# Patient Record
Sex: Female | Born: 1940 | Race: White | Hispanic: No | Marital: Married | Smoking: Never smoker
Health system: Southern US, Community
[De-identification: ages and names within clinical notes are randomized; demographics above are authoritative.]

## PROBLEM LIST (undated history)

## (undated) DIAGNOSIS — M8580 Other specified disorders of bone density and structure, unspecified site: Secondary | ICD-10-CM

## (undated) DIAGNOSIS — I639 Cerebral infarction, unspecified: Secondary | ICD-10-CM

## (undated) DIAGNOSIS — M1990 Unspecified osteoarthritis, unspecified site: Secondary | ICD-10-CM

## (undated) DIAGNOSIS — K922 Gastrointestinal hemorrhage, unspecified: Secondary | ICD-10-CM

## (undated) DIAGNOSIS — N3010 Interstitial cystitis (chronic) without hematuria: Secondary | ICD-10-CM

## (undated) DIAGNOSIS — K259 Gastric ulcer, unspecified as acute or chronic, without hemorrhage or perforation: Secondary | ICD-10-CM

## (undated) DIAGNOSIS — I10 Essential (primary) hypertension: Secondary | ICD-10-CM

## (undated) DIAGNOSIS — C801 Malignant (primary) neoplasm, unspecified: Secondary | ICD-10-CM

## (undated) DIAGNOSIS — U071 COVID-19: Secondary | ICD-10-CM

## (undated) HISTORY — PX: BUNIONECTOMY WITH HAMMERTOE RECONSTRUCTION: SHX5600

## (undated) HISTORY — DX: Essential (primary) hypertension: I10

## (undated) HISTORY — DX: Other specified disorders of bone density and structure, unspecified site: M85.80

## (undated) HISTORY — PX: KNEE SURGERY: SHX244

## (undated) HISTORY — PX: TOTAL VAGINAL HYSTERECTOMY: SHX2548

## (undated) HISTORY — PX: TOTAL ABDOMINAL HYSTERECTOMY W/ BILATERAL SALPINGOOPHORECTOMY: SHX83

## (undated) HISTORY — PX: BREAST SURGERY: SHX581

## (undated) HISTORY — DX: Malignant (primary) neoplasm, unspecified: C80.1

## (undated) HISTORY — PX: TONSILLECTOMY AND ADENOIDECTOMY: SUR1326

## (undated) HISTORY — DX: Gastric ulcer, unspecified as acute or chronic, without hemorrhage or perforation: K25.9

## (undated) HISTORY — DX: Interstitial cystitis (chronic) without hematuria: N30.10

## (undated) HISTORY — PX: BILATERAL SALPINGOOPHORECTOMY: SHX1223

---

## 1998-10-17 ENCOUNTER — Encounter: Payer: Self-pay | Admitting: Obstetrics and Gynecology

## 1998-10-17 ENCOUNTER — Ambulatory Visit (HOSPITAL_COMMUNITY): Admission: RE | Admit: 1998-10-17 | Discharge: 1998-10-17 | Payer: Self-pay | Admitting: Obstetrics and Gynecology

## 1999-01-01 ENCOUNTER — Other Ambulatory Visit: Admission: RE | Admit: 1999-01-01 | Discharge: 1999-01-01 | Payer: Self-pay | Admitting: Obstetrics and Gynecology

## 1999-08-02 ENCOUNTER — Ambulatory Visit (HOSPITAL_BASED_OUTPATIENT_CLINIC_OR_DEPARTMENT_OTHER): Admission: RE | Admit: 1999-08-02 | Discharge: 1999-08-02 | Payer: Self-pay | Admitting: *Deleted

## 1999-08-02 ENCOUNTER — Encounter (INDEPENDENT_AMBULATORY_CARE_PROVIDER_SITE_OTHER): Payer: Self-pay | Admitting: *Deleted

## 1999-08-02 ENCOUNTER — Encounter: Payer: Self-pay | Admitting: *Deleted

## 2000-04-21 ENCOUNTER — Other Ambulatory Visit: Admission: RE | Admit: 2000-04-21 | Discharge: 2000-04-21 | Payer: Self-pay | Admitting: Obstetrics and Gynecology

## 2000-05-11 ENCOUNTER — Ambulatory Visit (HOSPITAL_BASED_OUTPATIENT_CLINIC_OR_DEPARTMENT_OTHER): Admission: RE | Admit: 2000-05-11 | Discharge: 2000-05-11 | Payer: Self-pay | Admitting: *Deleted

## 2000-05-11 ENCOUNTER — Encounter (INDEPENDENT_AMBULATORY_CARE_PROVIDER_SITE_OTHER): Payer: Self-pay | Admitting: *Deleted

## 2001-01-15 ENCOUNTER — Encounter: Payer: Self-pay | Admitting: *Deleted

## 2001-01-15 ENCOUNTER — Ambulatory Visit (HOSPITAL_COMMUNITY): Admission: RE | Admit: 2001-01-15 | Discharge: 2001-01-15 | Payer: Self-pay | Admitting: *Deleted

## 2001-07-12 ENCOUNTER — Encounter: Payer: Self-pay | Admitting: *Deleted

## 2001-07-12 ENCOUNTER — Ambulatory Visit (HOSPITAL_COMMUNITY): Admission: RE | Admit: 2001-07-12 | Discharge: 2001-07-12 | Payer: Self-pay | Admitting: General Surgery

## 2001-12-02 ENCOUNTER — Encounter: Payer: Self-pay | Admitting: Unknown Physician Specialty

## 2001-12-02 ENCOUNTER — Ambulatory Visit (HOSPITAL_COMMUNITY): Admission: RE | Admit: 2001-12-02 | Discharge: 2001-12-02 | Payer: Self-pay | Admitting: Unknown Physician Specialty

## 2002-07-13 ENCOUNTER — Ambulatory Visit (HOSPITAL_COMMUNITY): Admission: RE | Admit: 2002-07-13 | Discharge: 2002-07-13 | Payer: Self-pay | Admitting: General Surgery

## 2002-07-13 ENCOUNTER — Encounter: Payer: Self-pay | Admitting: *Deleted

## 2003-06-28 ENCOUNTER — Encounter: Admission: RE | Admit: 2003-06-28 | Discharge: 2003-06-28 | Payer: Self-pay | Admitting: Internal Medicine

## 2003-08-02 ENCOUNTER — Ambulatory Visit (HOSPITAL_COMMUNITY): Admission: RE | Admit: 2003-08-02 | Discharge: 2003-08-02 | Payer: Self-pay

## 2003-08-02 ENCOUNTER — Encounter: Admission: RE | Admit: 2003-08-02 | Discharge: 2003-08-02 | Payer: Self-pay | Admitting: Unknown Physician Specialty

## 2003-08-08 ENCOUNTER — Ambulatory Visit (HOSPITAL_COMMUNITY): Admission: RE | Admit: 2003-08-08 | Discharge: 2003-08-08 | Payer: Self-pay | Admitting: Neurosurgery

## 2003-08-24 ENCOUNTER — Encounter: Admission: RE | Admit: 2003-08-24 | Discharge: 2003-08-24 | Payer: Self-pay | Admitting: Neurosurgery

## 2003-09-07 ENCOUNTER — Encounter: Admission: RE | Admit: 2003-09-07 | Discharge: 2003-09-07 | Payer: Self-pay | Admitting: Family Medicine

## 2003-09-25 ENCOUNTER — Encounter: Admission: RE | Admit: 2003-09-25 | Discharge: 2003-09-25 | Payer: Self-pay | Admitting: Neurosurgery

## 2004-03-11 ENCOUNTER — Encounter: Admission: RE | Admit: 2004-03-11 | Discharge: 2004-03-11 | Payer: Self-pay | Admitting: Oncology

## 2004-08-07 ENCOUNTER — Ambulatory Visit (HOSPITAL_COMMUNITY): Admission: RE | Admit: 2004-08-07 | Discharge: 2004-08-07 | Payer: Self-pay | Admitting: Oncology

## 2004-09-12 ENCOUNTER — Ambulatory Visit: Payer: Self-pay | Admitting: Oncology

## 2004-11-13 ENCOUNTER — Ambulatory Visit (HOSPITAL_COMMUNITY): Admission: RE | Admit: 2004-11-13 | Discharge: 2004-11-13 | Payer: Self-pay | Admitting: Urology

## 2004-12-03 ENCOUNTER — Ambulatory Visit (HOSPITAL_BASED_OUTPATIENT_CLINIC_OR_DEPARTMENT_OTHER): Admission: RE | Admit: 2004-12-03 | Discharge: 2004-12-03 | Payer: Self-pay | Admitting: Urology

## 2004-12-03 ENCOUNTER — Encounter (INDEPENDENT_AMBULATORY_CARE_PROVIDER_SITE_OTHER): Payer: Self-pay | Admitting: *Deleted

## 2004-12-03 ENCOUNTER — Ambulatory Visit (HOSPITAL_COMMUNITY): Admission: RE | Admit: 2004-12-03 | Discharge: 2004-12-03 | Payer: Self-pay | Admitting: Urology

## 2005-07-28 HISTORY — PX: CATARACT EXTRACTION: SUR2

## 2005-10-15 ENCOUNTER — Ambulatory Visit (HOSPITAL_COMMUNITY): Admission: RE | Admit: 2005-10-15 | Discharge: 2005-10-15 | Payer: Self-pay | Admitting: Surgery

## 2006-06-27 DIAGNOSIS — C50919 Malignant neoplasm of unspecified site of unspecified female breast: Secondary | ICD-10-CM

## 2006-06-27 DIAGNOSIS — C801 Malignant (primary) neoplasm, unspecified: Secondary | ICD-10-CM

## 2006-06-27 HISTORY — DX: Malignant neoplasm of unspecified site of unspecified female breast: C50.919

## 2006-06-27 HISTORY — DX: Malignant (primary) neoplasm, unspecified: C80.1

## 2006-11-18 ENCOUNTER — Ambulatory Visit (HOSPITAL_COMMUNITY): Admission: RE | Admit: 2006-11-18 | Discharge: 2006-11-18 | Payer: Self-pay | Admitting: Surgery

## 2007-07-28 ENCOUNTER — Other Ambulatory Visit: Admission: RE | Admit: 2007-07-28 | Discharge: 2007-07-28 | Payer: Self-pay | Admitting: Obstetrics & Gynecology

## 2007-11-22 ENCOUNTER — Ambulatory Visit (HOSPITAL_COMMUNITY): Admission: RE | Admit: 2007-11-22 | Discharge: 2007-11-22 | Payer: Self-pay | Admitting: Surgery

## 2008-08-03 ENCOUNTER — Other Ambulatory Visit: Admission: RE | Admit: 2008-08-03 | Discharge: 2008-08-03 | Payer: Self-pay | Admitting: Obstetrics and Gynecology

## 2008-10-02 ENCOUNTER — Encounter: Admission: RE | Admit: 2008-10-02 | Discharge: 2008-10-02 | Payer: Self-pay | Admitting: Orthopedic Surgery

## 2009-03-13 ENCOUNTER — Ambulatory Visit (HOSPITAL_COMMUNITY): Admission: RE | Admit: 2009-03-13 | Discharge: 2009-03-13 | Payer: Self-pay | Admitting: Surgery

## 2010-03-22 ENCOUNTER — Ambulatory Visit (HOSPITAL_COMMUNITY): Admission: RE | Admit: 2010-03-22 | Discharge: 2010-03-22 | Payer: Self-pay | Admitting: Internal Medicine

## 2010-12-13 NOTE — Op Note (Signed)
[place]  Patient:    Doris Ford                      MRN: [id number] Proc. Date: 08/02/99 Adm. Date:  [id number] Attending:  Kandis Mannan CC:         Malachi Pro. Ambrose Mantle, M.D.             Jonelle Sports. Cheryll Cockayne, M.D.             Thomas B. Samuella Cota, M.D.                           Operative Report  CCS#:  [id number]  PREOPERATIVE DIAGNOSIS:  Abnormal mammogram, right breast.  POSTOPERATIVE DIAGNOSIS:  Abnormal mammogram, right breast.  OPERATION:  Needle localization and biopsy of right breast.  SURGEON:  Maisie Fus B. Samuella Cota, M.D.  ANESTHESIA:  1% Xylocaine local with anesthesia monitoring by anesthesiologist nd C.R.N.A.  DESCRIPTION OF PROCEDURE:  The patient was taken to the operating room and placed on the table in the supine position.  The right breast was prepped and draped as a sterile field.  The patient had a wire coming through the skin in the 3 oclock position about 3 cm lateral to the edge of the areolar.  A transverse elliptical incision was outlined with the skin marker.  Then 1% Xylocaine local was used to infiltrate the skin and underlying breast tissue, and then it was used throughout as necessary.  An elliptical incision was made, and using the wire as a guide,  core of breast tissue was removed.  The patient had very small breasts, and I removed the breast tissue down to the pectoralis major muscle.  A rather generous biopsy around the wire was taken.  The specimen was x-rayed, and the area of concern was in the x-ray.  The bleeding had been controlled with the cautery. he wound was dry.  The subcutaneous tissue was reapproximated with interrupted sutures of 3-0 Vicryl, and the skin was closed with a running subcuticular suture of 5-0 Vicryl, and reinforced with benzoin and 0.5 inch Steri-Strips.  A pressure dressing using 4 x 4s, ABD, and 4 inch hypofix was applied.  The patient seemed to tolerated the procedure well  and was taken to the PACU in satisfactory condition. DD:  08/02/99 TD:  08/03/99 Job: [id number]

## 2010-12-13 NOTE — Op Note (Signed)
NAME:  Doris Ford, DANTE NO.:  [id number]   MEDICAL RECORD NO.:  [id number]          PATIENT TYPE:  AMB   LOCATION:  [facility]                         FACILITY:  [facility]   PHYSICIAN:  Jamison Neighbor, M.D.  DATE OF BIRTH:  [birth year]   DATE OF PROCEDURE:  12/03/2004  DATE OF DISCHARGE:                                 OPERATIVE REPORT   PREOPERATIVE DIAGNOSIS:  Painful bladder syndrome, rule out interstitial  cystitis.   POSTOPERATIVE DIAGNOSIS:  Painful bladder syndrome, rule out interstitial  cystitis.   PROCEDURES:  1.  Cystoscopy.  2.  Urethral calibration.  3.  Hydrodistention of the bladder.  4.  Marcaine and Pyridium instillation.  5.  Marcaine and Kenalog injection.  6.  Bladder biopsy with cauterization.   SURGEON:  Jamison Neighbor, M.D.   ANESTHESIA:  General.   COMPLICATIONS:  None.   DRAINS:  None.   BRIEF HISTORY:  This 70 year old female has a history of cystitis.  The  patient does not respond particularly well to antibiotic therapy and really  has not had particularly well-documented urinary tract infections.  The  patient has the classic symptoms of urgency, frequency and pain without  adequate response to antibiotic therapy and with really only a minimal  response to urinary analgesics like Pyridium Plus.  The patient has  previously undergone ultrasound, cystoscopy, dilation and CT scan without  any relief.  Evaluation was performed in [place].  The patient is now to  undergo cystoscopy and hydrodistention to determine if she might have  interstitial cystitis.  She understands the risks and benefits of the  procedure and gave full informed consent.   PROCEDURE:  After successful induction of general anesthesia, the patient  was placed in the dorsal lithotomy position, prepped with Betadine and  draped in the usual sterile fashion.  A careful bimanual exam revealed a  modest cystocele and rectocele but certainly no significant prolapse.   There  was no sign of diverticulum.  There were no palpable masses of any kind.  There was no urethral discharge or vaginal discharge.  The urethra was  calibrated at 62 [place] with female urethral sounds with no evidence of  stenosis or stricture.  The cystoscope was inserted.  The bladder was  carefully inspected.  It was free of any tumor or stones.  Both ureteral  orifices were normal in configuration and location.  Clear urine was seen to  efflux from each.  There was no sign of trabeculation or obstruction of any  kind.  The mucosa had a very normal appearance.  The bladder was distended  at a pressure of 100 cmH2O for five minutes.  When the bladder was drained  the bladder capacity was found to be just over 1100 mL, which is in essence  normal.  This compares quite favorably to the average bladder capacity for  interstitial cystitis, which is 575.  There were really no significant  glomerulations.  There was no ulcer formation, and there was no real  bleeding at the end of the drain-out cycle.  A biopsy was taken and sent  for  mast cell analysis.  The biopsy site was cauterized.  The bladder was  drained.  A mixture of Marcaine and Pyridium was left in the bladder,  Marcaine and Kenalog were injected periurethrally as a postoperative block.  The patient received intraoperative Toradol and Zofran as well as a B&O  suppository.  The patient tolerated the procedure well and was taken to the  recovery room in good condition.  She will be sent home with a prescription  for Lortab Plus.  The patient has Pyridium Plus at home, which she can take  if she wishes, and she will be covered for a few days with antibiotics with  doxycycline.  When the patient returns, will assess her response to the  hydrodistention.  If the patient has significant relief, then certainly she  can be treated like an interstitial cystitis treatment and started on an  [place]-based protocol.  Alternatively, if the  patient really has no  significant response to hydrodistention, she will need to be treated like an  overactive bladder patient and treated with urinary analgesics,  anticholinergics, and possibly antibiotic suppression of she does start  having positive cultures.  She would not, however, then be much of a  candidate for IC-directed therapy.      RJE/MEDQ  D:  12/03/2004  T:  12/03/2004  Job:  [id number]   cc:   Gaspar Garbe, M.D.  [address]  [place]  [place] [address]  Fax: [phone]

## 2010-12-20 LAB — HM COLONOSCOPY

## 2011-02-12 ENCOUNTER — Other Ambulatory Visit: Payer: Self-pay | Admitting: Obstetrics & Gynecology

## 2011-02-12 DIAGNOSIS — Z139 Encounter for screening, unspecified: Secondary | ICD-10-CM

## 2011-04-21 ENCOUNTER — Ambulatory Visit (HOSPITAL_COMMUNITY): Payer: Medicare Other

## 2011-04-28 ENCOUNTER — Ambulatory Visit (HOSPITAL_COMMUNITY)
Admission: RE | Admit: 2011-04-28 | Discharge: 2011-04-28 | Disposition: A | Discharge status: Home / Self Care | Payer: Medicare Other | Source: Ambulatory Visit | Attending: Obstetrics & Gynecology | Admitting: Obstetrics & Gynecology

## 2011-04-28 DIAGNOSIS — Z139 Encounter for screening, unspecified: Secondary | ICD-10-CM

## 2011-04-28 DIAGNOSIS — Z1231 Encounter for screening mammogram for malignant neoplasm of breast: Secondary | ICD-10-CM | POA: Insufficient documentation

## 2011-05-05 ENCOUNTER — Other Ambulatory Visit: Payer: Self-pay | Admitting: Obstetrics & Gynecology

## 2011-05-05 DIAGNOSIS — R928 Other abnormal and inconclusive findings on diagnostic imaging of breast: Secondary | ICD-10-CM

## 2011-06-04 ENCOUNTER — Other Ambulatory Visit (HOSPITAL_COMMUNITY): Payer: Self-pay | Admitting: Obstetrics & Gynecology

## 2011-06-04 ENCOUNTER — Ambulatory Visit (HOSPITAL_COMMUNITY)
Admission: RE | Admit: 2011-06-04 | Discharge: 2011-06-04 | Disposition: A | Discharge status: Home / Self Care | Payer: Medicare Other | Source: Ambulatory Visit | Attending: Obstetrics & Gynecology | Admitting: Obstetrics & Gynecology

## 2011-06-04 DIAGNOSIS — R928 Other abnormal and inconclusive findings on diagnostic imaging of breast: Secondary | ICD-10-CM

## 2011-06-05 ENCOUNTER — Encounter: Payer: Self-pay | Admitting: Gastroenterology

## 2011-11-26 DIAGNOSIS — N3010 Interstitial cystitis (chronic) without hematuria: Secondary | ICD-10-CM | POA: Insufficient documentation

## 2012-04-28 ENCOUNTER — Encounter: Payer: Self-pay | Admitting: Gastroenterology

## 2012-06-10 ENCOUNTER — Other Ambulatory Visit (HOSPITAL_COMMUNITY): Payer: Self-pay | Admitting: *Deleted

## 2012-06-10 DIAGNOSIS — Z139 Encounter for screening, unspecified: Secondary | ICD-10-CM

## 2012-06-22 ENCOUNTER — Ambulatory Visit (HOSPITAL_COMMUNITY)
Admission: RE | Admit: 2012-06-22 | Discharge: 2012-06-22 | Disposition: A | Discharge status: Home / Self Care | Payer: Medicare Other | Source: Ambulatory Visit | Attending: Internal Medicine | Admitting: Internal Medicine

## 2012-06-22 DIAGNOSIS — Z1231 Encounter for screening mammogram for malignant neoplasm of breast: Secondary | ICD-10-CM | POA: Insufficient documentation

## 2012-06-22 DIAGNOSIS — Z139 Encounter for screening, unspecified: Secondary | ICD-10-CM

## 2012-09-15 ENCOUNTER — Encounter: Payer: Self-pay | Admitting: Obstetrics & Gynecology

## 2012-09-17 ENCOUNTER — Encounter: Payer: Self-pay | Admitting: Obstetrics & Gynecology

## 2012-10-21 ENCOUNTER — Encounter: Payer: Self-pay | Admitting: Obstetrics & Gynecology

## 2012-10-21 ENCOUNTER — Ambulatory Visit (INDEPENDENT_AMBULATORY_CARE_PROVIDER_SITE_OTHER): Payer: Medicare Other | Admitting: Obstetrics & Gynecology

## 2012-10-21 VITALS — BP 112/70 | Ht 63.5 in | Wt 116.8 lb

## 2012-10-21 DIAGNOSIS — Z01419 Encounter for gynecological examination (general) (routine) without abnormal findings: Secondary | ICD-10-CM

## 2012-10-21 MED ORDER — ESTRADIOL 10 MCG VA TABS: 10.0000 ug | ORAL_TABLET | VAGINAL | Status: DC

## 2012-10-21 NOTE — Progress Notes (Addendum)
72 y.o. G2P2 MarriedCaucasianF here for annual exam.  Doing well.  No new issues.  Sees Dr. Wylene Simmer yearly.  On drug holiday from [place].  Just saw him in February.  No VB.  No LMP recorded. Patient has had a hysterectomy.          Sexually active: no  The current method of family planning is status post hysterectomy.    Exercising: yes   Smoker:  no  Health Maintenance: Pap:  08/07/09 WNL MMG:  11/13 at [facility] Colonoscopy:  5/12 repeat 10 years BMD:   2/14 lowest t score -1.8  TDaP:  Pt sure she is up to date Has also had pneumovax and zostavax   reports that she has never smoked. She has never used smokeless tobacco. She reports that she does not drink alcohol or use illicit drugs.  Past Medical History  Diagnosis Date  . Hypertension     mild  . Interstitial cystitis   . Osteopenia   . Cancer 12/07    right breast/tx with lumpectomy and tamoxifen    Past Surgical History  Procedure Laterality Date  . Breast surgery      lumpectomy-multiple times  . Total vaginal hysterectomy  age 38  . Bilateral salpingoophorectomy  age 74s  . Knee surgery      right knee repair  . Bunionectomy with hammertoe reconstruction    . Tonsillectomy and adenoidectomy  age 55    Current Outpatient Prescriptions  Medication Sig Dispense Refill  . Ascorbic Acid (VITAMIN C PO) Take by mouth daily.      [name] aspirin 81 MG tablet Take 81 mg by mouth.      . Calcium-Vitamin D (CALTRATE 600 PLUS-VIT D PO) Take 600 mg by mouth daily.      . Cholecalciferol (VITAMIN D PO) Take 1,000 Int'l Units by mouth.      . Estradiol (VAGIFEM) 10 MCG TABS Place 10 mcg vaginally 2 (two) times a week.      . Multiple Vitamins-Minerals (OCUVITE PO) Take by mouth.      . Naproxen Sodium (ALEVE PO) Take by mouth as needed.      . Omega-3 Fatty Acids (OMEGA 3 PO) Take by mouth.      . pentosan polysulfate (ELMIRON) 100 MG capsule Take 100 mg by mouth 2 (two) times daily.      . quinapril (ACCUPRIL) 10 MG tablet Take 10 mg  by mouth 2 (two) times daily.      . chlorthalidone (HYGROTON) 25 MG tablet        No current facility-administered medications for this visit.    Family History  Problem Relation Age of Onset  . Cancer Mother     breast/mastectomy  . Hypertension Father   . Heart disease Mother     poor circulation  . Deep vein thrombosis Mother   . Osteoporosis Mother     ROS:  Pertinent items are noted in HPI.  Otherwise, a comprehensive ROS was negative.  Exam:   BP 112/70  Ht 5' 3.5" (1.613 m)  Wt 116 lb 12.8 oz (52.98 kg)  BMI 20.36 kg/m2 Height:   Height: 5' 3.5" (161.3 cm)  Ht Readings from Last 3 Encounters:  10/21/12 5' 3.5" (1.613 m)    General appearance: alert, cooperative and appears stated age Head: Normocephalic, without obvious abnormality, atraumatic Neck: no adenopathy, supple, symmetrical, trachea midline and thyroid not enlarged, symmetric, no tenderness/mass/nodules Lungs: clear to auscultation bilaterally Breasts: Inspection negative, No nipple  retraction or dimpling, No nipple discharge or bleeding, No axillary or supraclavicular adenopathy, Normal to palpation without dominant masses, scarring from lumpectomy on right breast Heart: regular rate and rhythm Abdomen: soft, non-tender; bowel sounds normal; no masses,  no organomegaly Extremities: extremities normal, atraumatic, no cyanosis or edema Skin: Skin color, texture, turgor normal. No rashes or lesions Lymph nodes: Cervical, supraclavicular, and axillary nodes normal. No abnormal inguinal nodes palpated Neurologic: Grossly normal   Pelvic: External genitalia:  no lesions              Urethra:  normal appearing urethra with no masses, tenderness or lesions              Bartholins and Skenes: normal                 Vagina: normal appearing vagina with normal color and discharge, no lesions              Cervix: absent              Pap taken: no Bimanual Exam:  Uterus:  uterus absent              Adnexa: no  mass, fullness, tenderness, surgically absent               Rectovaginal: Confirms               Anus:  normal sphincter tone, no lesions  A:  Well Woman with normal exam H/O TVH/BSO Vaginal atrophy H/O R breast cancer s/p lumpectomy and tamoxifen 12/07  P:   Mammogram yearly Continue Vagifem.  Rx for 90 days. Sees PCP yearly return annually or prn  An After Visit Summary was printed and given to the patient.

## 2012-10-21 NOTE — Patient Instructions (Signed)

## 2013-08-04 ENCOUNTER — Other Ambulatory Visit (HOSPITAL_COMMUNITY): Payer: Self-pay | Admitting: Internal Medicine

## 2013-08-04 DIAGNOSIS — Z139 Encounter for screening, unspecified: Secondary | ICD-10-CM

## 2013-08-09 ENCOUNTER — Ambulatory Visit (HOSPITAL_COMMUNITY)
Admission: RE | Admit: 2013-08-09 | Discharge: 2013-08-09 | Disposition: A | Discharge status: Home / Self Care | Payer: Medicare HMO | Source: Ambulatory Visit | Attending: Internal Medicine | Admitting: Internal Medicine

## 2013-08-09 DIAGNOSIS — Z1231 Encounter for screening mammogram for malignant neoplasm of breast: Secondary | ICD-10-CM | POA: Insufficient documentation

## 2013-08-09 DIAGNOSIS — Z139 Encounter for screening, unspecified: Secondary | ICD-10-CM

## 2013-10-10 ENCOUNTER — Ambulatory Visit
Admission: RE | Admit: 2013-10-10 | Discharge: 2013-10-10 | Disposition: A | Discharge status: Home / Self Care | Payer: Medicare HMO | Source: Ambulatory Visit | Attending: Physical Medicine and Rehabilitation | Admitting: Physical Medicine and Rehabilitation

## 2013-10-10 ENCOUNTER — Other Ambulatory Visit: Payer: Self-pay | Admitting: Physical Medicine and Rehabilitation

## 2013-10-10 DIAGNOSIS — M79609 Pain in unspecified limb: Secondary | ICD-10-CM

## 2013-10-10 DIAGNOSIS — M461 Sacroiliitis, not elsewhere classified: Secondary | ICD-10-CM

## 2013-10-10 DIAGNOSIS — M47817 Spondylosis without myelopathy or radiculopathy, lumbosacral region: Secondary | ICD-10-CM

## 2013-10-10 DIAGNOSIS — IMO0001 Reserved for inherently not codable concepts without codable children: Secondary | ICD-10-CM

## 2013-10-14 ENCOUNTER — Emergency Department (HOSPITAL_COMMUNITY)
Admission: EM | Admit: 2013-10-14 | Discharge: 2013-10-14 | Disposition: A | Discharge status: Home / Self Care | Payer: Medicare HMO | Attending: Emergency Medicine | Admitting: Emergency Medicine

## 2013-10-14 ENCOUNTER — Emergency Department (HOSPITAL_COMMUNITY): Payer: Medicare HMO

## 2013-10-14 ENCOUNTER — Encounter (HOSPITAL_COMMUNITY): Payer: Self-pay | Admitting: Emergency Medicine

## 2013-10-14 DIAGNOSIS — Z87448 Personal history of other diseases of urinary system: Secondary | ICD-10-CM | POA: Insufficient documentation

## 2013-10-14 DIAGNOSIS — M899 Disorder of bone, unspecified: Secondary | ICD-10-CM | POA: Insufficient documentation

## 2013-10-14 DIAGNOSIS — Z853 Personal history of malignant neoplasm of breast: Secondary | ICD-10-CM | POA: Insufficient documentation

## 2013-10-14 DIAGNOSIS — Z79899 Other long term (current) drug therapy: Secondary | ICD-10-CM | POA: Insufficient documentation

## 2013-10-14 DIAGNOSIS — M949 Disorder of cartilage, unspecified: Secondary | ICD-10-CM

## 2013-10-14 DIAGNOSIS — M25459 Effusion, unspecified hip: Secondary | ICD-10-CM | POA: Insufficient documentation

## 2013-10-14 DIAGNOSIS — I10 Essential (primary) hypertension: Secondary | ICD-10-CM | POA: Insufficient documentation

## 2013-10-14 DIAGNOSIS — M25451 Effusion, right hip: Secondary | ICD-10-CM

## 2013-10-14 LAB — BASIC METABOLIC PANEL
BUN: 22 mg/dL (ref 6–23)
CALCIUM: 9.9 mg/dL (ref 8.4–10.5)
CO2: 31 mEq/L (ref 19–32)
CREATININE: 0.67 mg/dL (ref 0.50–1.10)
Chloride: 100 mEq/L (ref 96–112)
GFR calc non Af Amer: 84 mL/min — ABNORMAL LOW (ref >90)
GLUCOSE: 109 mg/dL — AB (ref 70–99)
POTASSIUM: 3.9 meq/L (ref 3.7–5.3)
Sodium: 142 mEq/L (ref 137–147)

## 2013-10-14 LAB — CBC WITH DIFFERENTIAL/PLATELET
BASOS PCT: 1 % (ref 0–1)
Basophils Absolute: 0 10*3/uL (ref 0.0–0.1)
EOS PCT: 1 % (ref 0–5)
Eosinophils Absolute: 0.1 10*3/uL (ref 0.0–0.7)
HCT: 42.4 % (ref 36.0–46.0)
HEMOGLOBIN: 14.6 g/dL (ref 12.0–15.0)
LYMPHS ABS: 1.8 10*3/uL (ref 0.7–4.0)
Lymphocytes Relative: 22 % (ref 12–46)
MCH: 33.2 pg (ref 26.0–34.0)
MCHC: 34.4 g/dL (ref 30.0–36.0)
MCV: 96.4 fL (ref 78.0–100.0)
MONO ABS: 0.8 10*3/uL (ref 0.1–1.0)
MONOS PCT: 10 % (ref 3–12)
NEUTROS PCT: 67 % (ref 43–77)
Neutro Abs: 5.5 10*3/uL (ref 1.7–7.7)
Platelets: 226 10*3/uL (ref 150–400)
RBC: 4.4 MIL/uL (ref 3.87–5.11)
RDW: 12.5 % (ref 11.5–15.5)
WBC: 8.2 10*3/uL (ref 4.0–10.5)

## 2013-10-14 LAB — SEDIMENTATION RATE: Sed Rate: 6 mm/hr (ref 0–22)

## 2013-10-14 MED ORDER — PREDNISONE 10 MG PO TABS: 20.0000 mg | ORAL_TABLET | Freq: Every day | ORAL | Status: DC

## 2013-10-14 MED ORDER — ACETAMINOPHEN 325 MG PO TABS
650.0000 mg | ORAL_TABLET | Freq: Once | ORAL | Status: AC
  Administered 2013-10-14: 650 mg via ORAL
  Filled 2013-10-14: qty 2

## 2013-10-14 MED ORDER — PREDNISONE 50 MG PO TABS
60.0000 mg | ORAL_TABLET | Freq: Once | ORAL | Status: AC
  Administered 2013-10-14: 60 mg via ORAL
  Filled 2013-10-14 (×2): qty 1

## 2013-10-14 NOTE — ED Notes (Signed)
Pt c/o right pelvic pain. States she was seen by pcp for right pelvic/hip pain and had injections Tuesday. Pt states pain got worse last night.

## 2013-10-14 NOTE — ED Notes (Signed)
Eating crackers and drinking sprite. Alert, says no relief with tylenol and prednisone

## 2013-10-14 NOTE — ED Provider Notes (Signed)
CSN: [id number]     Arrival date & time 10/14/13  1238 History   First MD Initiated Contact with Patient 10/14/13 1505     Chief Complaint  Patient presents with  . Pelvic Pain     (Consider location/radiation/quality/duration/timing/severity/associated sxs/prior Treatment) HPI Comments: Doris Ford is a 73 y.o. Female with a 2 week history of right hip pain which is constant, deep, aching and worsened by movement and weight bearing.  She denies any injury or trauma to the joint with no recent falls.  She describes being on a walking program for exercise which is not new or different.  She was seen by Dr. Niel Hummer for this pain last week,  Had plain xrays of the joint which showed minimal arthritic changes and her pain was felt to be due to a flexor tendonitis, per patient report.  Three days ago she had a dexamethasone injection along with lidocaine and she felt much better until last night when she stood up after sitting at a restaurant and had sudden onset of severe worsened pain.  She cannot currently bear weight on her right extremity.  She did not sleep well last night secondary to pain, she has had tylenol and a dose of tramadol without relief of her symptoms.  She has had no fevers or chills, no nausea, vomiting and denies any swelling or redness at the injection site.     The history is provided by the patient and the spouse.    Past Medical History  Diagnosis Date  . Hypertension     mild  . Interstitial cystitis   . Osteopenia   . Cancer 12/07    right breast/tx with lumpectomy and tamoxifen   Past Surgical History  Procedure Laterality Date  . Breast surgery      lumpectomy-multiple times  . Total vaginal hysterectomy  age 80  . Bilateral salpingoophorectomy  age 39s  . Knee surgery      right knee repair  . Bunionectomy with hammertoe reconstruction    . Tonsillectomy and adenoidectomy  age 54   Family History  Problem Relation Age of Onset  . Cancer  Mother     breast/mastectomy  . Hypertension Father   . Heart disease Mother     poor circulation  . Deep vein thrombosis Mother   . Osteoporosis Mother    History  Substance Use Topics  . Smoking status: Never Smoker   . Smokeless tobacco: Never Used  . Alcohol Use: No   OB History   Grav Para Term Preterm Abortions TAB SAB Ect Mult Living   2 2        2      Review of Systems  Constitutional: Negative for fever and chills.  Gastrointestinal: Negative for nausea.  Musculoskeletal: Positive for arthralgias. Negative for joint swelling and myalgias.  Skin: Negative for color change.  Neurological: Negative for weakness and numbness.      Allergies  Review of patient's allergies indicates no known allergies.  Home Medications   Current Outpatient Rx  Name  Route  Sig  Dispense  Refill  . Ascorbic Acid (VITAMIN C PO)   Oral   Take by mouth daily.         . Calcium-Vitamin D (CALTRATE 600 PLUS-VIT D PO)   Oral   Take 600 mg by mouth daily.         . chlorthalidone (HYGROTON) 25 MG tablet   Oral   Take 25 mg by  mouth daily.          . Cholecalciferol (VITAMIN D PO)   Oral   Take 1,000 Int'l Units by mouth.         . indomethacin (INDOCIN) 25 MG capsule   Oral   Take 25 mg by mouth every 6 (six) hours as needed.         . Multiple Vitamins-Minerals (OCUVITE PO)   Oral   Take by mouth.         . Omega-3 Fatty Acids (OMEGA 3 PO)   Oral   Take 1 g by mouth daily.          . quinapril (ACCUPRIL) 20 MG tablet   Oral   Take 20 mg by mouth 2 (two) times daily.         . Estradiol 10 MCG TABS vaginal tablet   Vaginal   Place 10 mcg vaginally once a week.         . Naproxen Sodium (ALEVE PO)   Oral   Take 220 mg by mouth 2 (two) times daily as needed (pain).           BP 131/79  Pulse 84  Temp(Src) 98 F (36.7 C)  Resp 18  Ht 5' 4.5" (1.638 m)  Wt 118 lb (53.524 kg)  BMI 19.95 kg/m2  SpO2 97% Physical Exam  Constitutional: She  appears well-developed and well-nourished.  HENT:  Head: Atraumatic.  Neck: Normal range of motion.  Cardiovascular:  Pulses:      Dorsalis pedis pulses are 2+ on the right side, and 2+ on the left side.  Pulses equal bilaterally  Musculoskeletal: She exhibits tenderness. She exhibits no edema.       Right hip: She exhibits tenderness. She exhibits no swelling and no crepitus.  TTP right anterior groin.  There is a small puncture at the site of pain in the medial groin crease, no erythema or edema.  Patient has increased pain with active and passive hip flexion, internal and external rotation.  No erythema, no red streaking,  No edema at the injection site.    Neurological: She is alert. She has normal strength. She displays normal reflexes. No sensory deficit.  Skin: Skin is warm and dry.  Psychiatric: She has a normal mood and affect.    ED Course  Procedures (including critical care time) Labs Review Labs Reviewed  CBC WITH DIFFERENTIAL  BASIC METABOLIC PANEL  SEDIMENTATION RATE   Imaging Review Ct Hip Right Wo Contrast  10/14/2013   CLINICAL DATA:  Bilateral hip pain, right greater than left.  EXAM: CT OF THE RIGHT HIP WITHOUT CONTRAST  TECHNIQUE: Multidetector CT imaging was performed according to the standard protocol. Multiplanar CT image reconstructions were also generated.  COMPARISON:  DG PELVIS 1-2 VIEWS dated 10/10/2013; CT ABDOMEN WO/W CM dated 11/13/2004; DG HIP COMPLETE*R* dated 10/10/2013  FINDINGS: Severe axial articular space narrowing with considerable acetabular and femoral head spurring. No significant protrusio. Chronic spurring along the femoral neck. No obvious cortical discontinuity to suggest fracture in the right proximal femur or adjacent acetabulum.  There is an abnormal hip effusion. No CT indicators of avascular necrosis of the femoral head. No sciatic notch impingement. No obturator impingement. Distal iliopsoas tendon appears intact. Hamstrings grossly intact.   IMPRESSION: 1. Right hip joint effusion 2. 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 I favor degenerative arthropathy over rheumatoid arthropathy.   Electronically Signed   By: Sherryl Barters M.D.  On: 10/14/2013 16:22     EKG Interpretation None      MDM   Final diagnoses:  None   Offered pt pain medicine which she defers.     Spoke with Dr Theda Sers with [facility] for phone consult.  With caveat that knowing if the steroid injection was a hip injection or a more superficial tendon injection, which would be helpful information, doubt septic joint from this procedure.  Most likely the effusion is stemming from arthropathy.  If web count or sed rate is elevated however,  Would consider getting joint aspiration under fluor by interventional radiology.  If these labs are negative,  Would treat with pain meds,  Anti inflammatories and a walker.    Pt was seen by Dr Doris Ford who will follow patient and dispo once labs result.      Doris Jefferson, PA-C 10/14/13 1729    73 y.o. Female with right hip pain who had injection for same three days ago.  CT performed here with right hip effusion.  Labs and sed rate obtained and normal.  NO s/s infection from history or exam- no warmth, redness, fever, wbc and sed rate normal.  Doris Ford discussed with ortho on call.  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  Reviewed with patient return precautions and need for close follow up.  Plan prednisone and walker for home use.  Patient to follow up with Dr. Ace Gins on Monday.   I performed a history and physical examination of Doris Ford and discussed her management with Doris Ford.  I agree with the history, physical, assessment, and plan of care, with the following exceptions: None  I was present for the following procedures: None Time Spent in Critical Care of the patient: None Time spent in discussions  with the patient and family: 10  Doris Mcelwain Shelda Jakes, MD 10/14/13 2026

## 2013-10-14 NOTE — ED Notes (Signed)
Pain rt inguinal area for 2 weeks.  Seen by MD on Monday and area was injected - pt felt better until last pm Pain with movement and tender to palp.  Good dp pulse .

## 2013-10-14 NOTE — ED Notes (Signed)
Up to [facility] with assistance,

## 2013-10-14 NOTE — Discharge Instructions (Signed)
Hip Pain  The hips join the upper legs to the lower pelvis. The bones, cartilage, tendons, and muscles of the hip joint perform a lot of work each day holding your body weight and allowing you to move around.  Hip pain is a common symptom. It can range from a minor ache to severe pain on 1 or both hips. Pain may be felt on the inside of the hip joint near the groin, or the outside near the buttocks and upper thigh. There may be swelling or stiffness as well. It occurs more often when a person walks or performs activity. There are many reasons hip pain can develop.  CAUSES   It is important to work with your caregiver to identify the cause since many conditions can impact the bones, cartilage, muscles, and tendons of the hips. Causes for hip pain include:   Broken (fractured) bones.   Separation of the thighbone from the hip socket (dislocation).   Torn cartilage of the hip joint.   Swelling (inflammation) of a tendon (tendonitis), the sac within the hip joint (bursitis), or a joint.   A weakening in the abdominal wall (hernia), affecting the nerves to the hip.   Arthritis in the hip joint or lining of the hip joint.   Pinched nerves in the back, hip, or upper thigh.   A bulging disc in the spine (herniated disc).   Rarely, bone infection or cancer.  DIAGNOSIS   The location of your hip pain will help your caregiver understand what may be causing the pain. A diagnosis is based on your medical history, your symptoms, results from your physical exam, and results from diagnostic tests. Diagnostic tests may include X-Mariyam Remington exams, a computerized magnetic scan (magnetic resonance imaging, MRI), or bone scan.  TREATMENT   Treatment will depend on the cause of your hip pain. Treatment may include:   Limiting activities and resting until symptoms improve.   Crutches or other walking supports (a cane or brace).   Ice, elevation, and compression.   Physical therapy or home exercises.   Shoe inserts or special  shoes.   Losing weight.   Medications to reduce pain.   Undergoing surgery.  HOME CARE INSTRUCTIONS    Only take over-the-counter or prescription medicines for pain, discomfort, or fever as directed by your caregiver.   Put ice on the injured area:   Put ice in a plastic bag.   Place a towel between your skin and the bag.   Leave the ice on for 15-20 minutes at a time, 03-04 times a day.   Keep your leg raised (elevated) when possible to lessen swelling.   Avoid activities that cause pain.   Follow specific exercises as directed by your caregiver.   Sleep with a pillow between your legs on your most comfortable side.   Record how often you have hip pain, the location of the pain, and what it feels like. This information may be helpful to you and your caregiver.   Ask your caregiver about returning to work or sports and whether you should drive.   Follow up with your caregiver for further exams, therapy, or testing as directed.  SEEK MEDICAL CARE IF:    Your pain or swelling continues or worsens after 1 week.   You are feeling unwell or have chills.   You have increasing difficulty with walking.   You have a loss of sensation or other new symptoms.   You have questions or concerns.  SEEK   IMMEDIATE MEDICAL CARE IF:    You cannot put weight on the affected hip.   You have fallen.   You have a sudden increase in pain and swelling in your hip.   You have a fever.  MAKE SURE YOU:    Understand these instructions.   Will watch your condition.   Will get help right away if you are not doing well or get worse.  Document Released: 01/01/2010 Document Revised: 10/06/2011 Document Reviewed: 01/01/2010  [employer] Patient Information 2014 [employer].

## 2013-11-03 ENCOUNTER — Other Ambulatory Visit (HOSPITAL_COMMUNITY): Payer: 59 | Admitting: Physical Medicine and Rehabilitation

## 2013-11-03 ENCOUNTER — Other Ambulatory Visit (HOSPITAL_COMMUNITY): Payer: Self-pay | Admitting: Physical Medicine and Rehabilitation

## 2013-11-03 DIAGNOSIS — M24859 Other specific joint derangements of unspecified hip, not elsewhere classified: Secondary | ICD-10-CM

## 2013-11-03 DIAGNOSIS — M2480 Other specific joint derangements of unspecified joint, not elsewhere classified: Secondary | ICD-10-CM

## 2013-11-03 DIAGNOSIS — R21 Rash and other nonspecific skin eruption: Secondary | ICD-10-CM

## 2013-11-03 DIAGNOSIS — IMO0001 Reserved for inherently not codable concepts without codable children: Secondary | ICD-10-CM

## 2013-11-03 DIAGNOSIS — M25559 Pain in unspecified hip: Secondary | ICD-10-CM

## 2013-11-10 ENCOUNTER — Ambulatory Visit (HOSPITAL_COMMUNITY): Payer: Medicare HMO

## 2013-11-10 ENCOUNTER — Other Ambulatory Visit (HOSPITAL_COMMUNITY): Payer: Medicare HMO

## 2013-11-15 ENCOUNTER — Encounter (HOSPITAL_COMMUNITY): Payer: Self-pay

## 2013-11-15 ENCOUNTER — Ambulatory Visit (HOSPITAL_COMMUNITY)
Admission: RE | Admit: 2013-11-15 | Discharge: 2013-11-15 | Disposition: A | Discharge status: Home / Self Care | Payer: Medicare HMO | Source: Ambulatory Visit | Attending: Physical Medicine and Rehabilitation | Admitting: Physical Medicine and Rehabilitation

## 2013-11-15 DIAGNOSIS — IMO0001 Reserved for inherently not codable concepts without codable children: Secondary | ICD-10-CM

## 2013-11-15 DIAGNOSIS — M2480 Other specific joint derangements of unspecified joint, not elsewhere classified: Secondary | ICD-10-CM

## 2013-11-15 DIAGNOSIS — M24859 Other specific joint derangements of unspecified hip, not elsewhere classified: Secondary | ICD-10-CM

## 2013-11-15 DIAGNOSIS — R21 Rash and other nonspecific skin eruption: Secondary | ICD-10-CM

## 2013-11-15 DIAGNOSIS — M25559 Pain in unspecified hip: Secondary | ICD-10-CM | POA: Insufficient documentation

## 2013-11-15 MED ORDER — LIDOCAINE HCL (PF) 2 % IJ SOLN
10.0000 mL | Freq: Once | INTRAMUSCULAR | Status: AC
  Administered 2013-11-15: 10 mL

## 2013-11-15 MED ORDER — IOHEXOL 300 MG/ML  SOLN
50.0000 mL | Freq: Once | INTRAMUSCULAR | Status: AC | PRN
  Administered 2013-11-15: 15 mL via INTRAVENOUS

## 2013-11-15 MED ORDER — GADOBENATE DIMEGLUMINE 529 MG/ML IV SOLN
5.0000 mL | Freq: Once | INTRAVENOUS | Status: AC | PRN
  Administered 2013-11-15: 0.1 mL via INTRAVENOUS

## 2013-11-15 MED ORDER — POVIDONE-IODINE 10 % EX SOLN
CUTANEOUS | Status: AC
  Filled 2013-11-15: qty 15

## 2013-11-15 MED ORDER — LIDOCAINE HCL (PF) 2 % IJ SOLN
INTRAMUSCULAR | Status: AC
  Filled 2013-11-15: qty 10

## 2013-11-15 MED ORDER — LIDOCAINE HCL (PF) 1 % IJ SOLN
INTRAMUSCULAR | Status: AC
  Administered 2013-11-15: 5 mL
  Filled 2013-11-15: qty 5

## 2013-11-15 MED ORDER — LIDOCAINE HCL (PF) 1 % IJ SOLN: 5.0000 mL | Freq: Once | INTRAMUSCULAR | Status: AC

## 2013-11-15 MED ORDER — POVIDONE-IODINE 10 % EX SOLN
CUTANEOUS | Status: DC | PRN
  Administered 2013-11-15: 09:00:00 via TOPICAL

## 2013-11-15 NOTE — Procedures (Signed)
Preprocedure Dx: Right hip pain Postprocedure Dx: Right hip pain Procedure  Fluoroscopically guided right hip joint injection for MR arthrogrpahy Radiologist:  Thornton Papas Anesthesia:  3 ml of 2% lidocaine Injectate:  10 ml of [15 ml Omni300, 5 ml 1% lidocaine, 0.05 ml Multihance] Fluoro time:  1 minutes 6 seconds EBL:   < 1 ml Complications: None

## 2013-11-29 ENCOUNTER — Other Ambulatory Visit (HOSPITAL_COMMUNITY): Payer: Self-pay | Admitting: Orthopaedic Surgery

## 2013-12-08 ENCOUNTER — Encounter (HOSPITAL_COMMUNITY): Payer: Self-pay | Admitting: Pharmacy Technician

## 2013-12-12 ENCOUNTER — Encounter (HOSPITAL_COMMUNITY): Payer: Self-pay

## 2013-12-12 ENCOUNTER — Encounter (HOSPITAL_COMMUNITY)
Admission: RE | Admit: 2013-12-12 | Discharge: 2013-12-12 | Disposition: A | Discharge status: Home / Self Care | Payer: Medicare HMO | Source: Ambulatory Visit | Attending: Orthopaedic Surgery | Admitting: Orthopaedic Surgery

## 2013-12-12 ENCOUNTER — Ambulatory Visit (HOSPITAL_COMMUNITY)
Admission: RE | Admit: 2013-12-12 | Discharge: 2013-12-12 | Disposition: A | Discharge status: Home / Self Care | Payer: Medicare HMO | Source: Ambulatory Visit | Attending: Anesthesiology | Admitting: Anesthesiology

## 2013-12-12 ENCOUNTER — Other Ambulatory Visit: Payer: Self-pay

## 2013-12-12 DIAGNOSIS — M4120 Other idiopathic scoliosis, site unspecified: Secondary | ICD-10-CM | POA: Insufficient documentation

## 2013-12-12 DIAGNOSIS — Z01818 Encounter for other preprocedural examination: Secondary | ICD-10-CM | POA: Insufficient documentation

## 2013-12-12 DIAGNOSIS — Z01812 Encounter for preprocedural laboratory examination: Secondary | ICD-10-CM | POA: Insufficient documentation

## 2013-12-12 DIAGNOSIS — M47814 Spondylosis without myelopathy or radiculopathy, thoracic region: Secondary | ICD-10-CM | POA: Insufficient documentation

## 2013-12-12 DIAGNOSIS — Z01810 Encounter for preprocedural cardiovascular examination: Secondary | ICD-10-CM | POA: Insufficient documentation

## 2013-12-12 HISTORY — DX: Unspecified osteoarthritis, unspecified site: M19.90

## 2013-12-12 LAB — URINALYSIS, ROUTINE W REFLEX MICROSCOPIC
BILIRUBIN URINE: NEGATIVE
Glucose, UA: NEGATIVE mg/dL
HGB URINE DIPSTICK: NEGATIVE
Ketones, ur: NEGATIVE mg/dL
NITRITE: NEGATIVE
PH: 7.5 (ref 5.0–8.0)
Protein, ur: NEGATIVE mg/dL
SPECIFIC GRAVITY, URINE: 1.015 (ref 1.005–1.030)
UROBILINOGEN UA: 0.2 mg/dL (ref 0.0–1.0)

## 2013-12-12 LAB — BASIC METABOLIC PANEL
BUN: 30 mg/dL — ABNORMAL HIGH (ref 6–23)
CO2: 31 meq/L (ref 19–32)
Calcium: 11 mg/dL — ABNORMAL HIGH (ref 8.4–10.5)
Chloride: 97 mEq/L (ref 96–112)
Creatinine, Ser: 0.89 mg/dL (ref 0.50–1.10)
GFR calc non Af Amer: 62 mL/min — ABNORMAL LOW (ref >90)
GFR, EST AFRICAN AMERICAN: 72 mL/min — AB (ref >90)
Glucose, Bld: 92 mg/dL (ref 70–99)
Potassium: 4.4 mEq/L (ref 3.7–5.3)
SODIUM: 140 meq/L (ref 137–147)

## 2013-12-12 LAB — CBC
HEMATOCRIT: 41 % (ref 36.0–46.0)
Hemoglobin: 14.3 g/dL (ref 12.0–15.0)
MCH: 32.9 pg (ref 26.0–34.0)
MCHC: 34.9 g/dL (ref 30.0–36.0)
MCV: 94.3 fL (ref 78.0–100.0)
PLATELETS: 206 10*3/uL (ref 150–400)
RBC: 4.35 MIL/uL (ref 3.87–5.11)
RDW: 12.1 % (ref 11.5–15.5)
WBC: 6.6 10*3/uL (ref 4.0–10.5)

## 2013-12-12 LAB — PROTIME-INR
INR: 0.94 (ref 0.00–1.49)
PROTHROMBIN TIME: 12.4 s (ref 11.6–15.2)

## 2013-12-12 LAB — APTT: APTT: 32 s (ref 24–37)

## 2013-12-12 LAB — SURGICAL PCR SCREEN
MRSA, PCR: INVALID — AB
Staphylococcus aureus: INVALID — AB

## 2013-12-12 LAB — URINE MICROSCOPIC-ADD ON

## 2013-12-12 LAB — ABO/RH: ABO/RH(D): O POS

## 2013-12-12 NOTE — Patient Instructions (Addendum)
[place]  12/12/2013   Your procedure is scheduled on: 12/16/13  Report to [facility] at 12:15 PM.  Call this number if you have problems the morning of surgery [phone]   Remember:   Do not eat food After Midnight, clear liquids from midnight until 8:15 am on 12/16/13 then nothing.    Do not wear jewelry, make-up or nail polish.  Do not wear lotions, powders, or perfumes. You may wear deodorant.  Do not shave 48 hours prior to surgery. Men may shave face and neck.  Do not bring valuables to the hospital.  Contacts, dentures or bridgework may not be worn into surgery.  Leave suitcase in the car. After surgery it may be brought to your room.  For patients admitted to the hospital, checkout time is 11:00 AM the day of discharge.    Please read over the following fact sheets that you were given:MRSA [place], RN  pre op nurse call if needed [phone]    [facility] - Preparing for Surgery Before surgery, you can play an important role.  Because skin is not sterile, your skin needs to be as free of germs as possible.  You can reduce the number of germs on your skin by washing with CHG (chlorahexidine gluconate) soap before surgery.  CHG is an antiseptic cleaner which kills germs and bonds with the skin to continue killing germs even after washing. Please DO NOT use if you have an allergy to CHG or antibacterial soaps.  If your skin becomes reddened/irritated stop using the CHG and inform your nurse when you arrive at Short Stay. Do not shave (including legs and underarms) for at least 48 hours prior to the first CHG shower.  You may shave your face. Please follow these instructions carefully:  1.  Shower with CHG Soap the night before surgery and the  morning of Surgery.  2.  If you choose to wash your hair, wash your hair first as usual with your  normal  shampoo.  3.  After you shampoo, rinse your hair and body thoroughly to remove the   shampoo.                            4.  Use CHG as you would any other liquid soap.  You can apply chg directly  to the skin and wash                       Gently with a scrungie or clean washcloth.  5.  Apply the CHG Soap to your body ONLY FROM THE NECK DOWN.   Do not use on open                           Wound or open sores. Avoid contact with eyes, ears mouth and genitals (private parts).                        Genitals (private parts) with your normal soap.             6.  Wash thoroughly, paying special attention to the area where your surgery  will be performed.  7.  Thoroughly rinse your body with warm water from the neck down.  8.  DO NOT shower/wash with your normal soap after using and rinsing off  the CHG Soap.                9.  Pat yourself dry with a clean towel.            10.  Wear clean pajamas.            11.  Place clean sheets on your bed the night of your first shower and do not  sleep with pets. Day of Surgery : Do not apply any lotions/deodorants the morning of surgery.  Please wear clean clothes to the hospital/surgery center.  FAILURE TO FOLLOW THESE INSTRUCTIONS MAY RESULT IN THE CANCELLATION OF YOUR SURGERY PATIENT SIGNATURE_________________________________  NURSE SIGNATURE__________________________________  ________________________________________________________________________    CLEAR LIQUID DIET   Foods Allowed                                                                     Foods Excluded  Coffee and tea, regular and decaf                             liquids that you cannot  Plain Jell-O in any flavor                                             see through such as: Fruit ices (not with fruit pulp)                                     milk, soups, orange juice  Iced Popsicles                                    All solid food Carbonated beverages, regular and diet                                    Cranberry, grape and apple juices Sports drinks  like Gatorade Lightly seasoned clear broth or consume(fat free) Sugar, honey syrup  Sample Menu Breakfast                                Lunch                                     Supper Cranberry juice                    Beef broth                            Chicken broth Jell-O  Grape juice                           Apple juice Coffee or tea                        Jell-O                                      Popsicle                                                Coffee or tea                        Coffee or tea  _____________________________________________________________________   WHAT IS A BLOOD TRANSFUSION? Blood Transfusion Information  A transfusion is the replacement of blood or some of its parts. Blood is made up of multiple cells which provide different functions.  Red blood cells carry oxygen and are used for blood loss replacement.  White blood cells fight against infection.  Platelets control bleeding.  Plasma helps clot blood.  Other blood products are available for specialized needs, such as hemophilia or other clotting disorders. BEFORE THE TRANSFUSION  Who gives blood for transfusions?   Healthy volunteers who are fully evaluated to make sure their blood is safe. This is blood bank blood. Transfusion therapy is the safest it has ever been in the practice of medicine. Before blood is taken from a donor, a complete history is taken to make sure that person has no history of diseases nor engages in risky social behavior (examples are intravenous drug use or sexual activity with multiple partners). The donor's travel history is screened to minimize risk of transmitting infections, such as malaria. The donated blood is tested for signs of infectious diseases, such as HIV and hepatitis. The blood is then tested to be sure it is compatible with you in order to minimize the chance of a transfusion reaction. If you or a relative donates  blood, this is often done in anticipation of surgery and is not appropriate for emergency situations. It takes many days to process the donated blood. RISKS AND COMPLICATIONS Although transfusion therapy is very safe and saves many lives, the main dangers of transfusion include:   Getting an infectious disease.  Developing a transfusion reaction. This is an allergic reaction to something in the blood you were given. Every precaution is taken to prevent this. The decision to have a blood transfusion has been considered carefully by your caregiver before blood is given. Blood is not given unless the benefits outweigh the risks. AFTER THE TRANSFUSION  Right after receiving a blood transfusion, you will usually feel much better and more energetic. This is especially true if your red blood cells have gotten low (anemic). The transfusion raises the level of the red blood cells which carry oxygen, and this usually causes an energy increase.  The nurse administering the transfusion will monitor you carefully for complications. HOME CARE INSTRUCTIONS  No special instructions are needed after a transfusion. You may find your energy is better. Speak with your caregiver about any limitations on activity for underlying diseases you may have. SEEK MEDICAL CARE IF:  Your condition is not improving after your transfusion.  You develop redness or irritation at the intravenous (IV) site. SEEK IMMEDIATE MEDICAL CARE IF:  Any of the following symptoms occur over the next 12 hours:  Shaking chills.  You have a temperature by mouth above 102 F (38.9 C), not controlled by medicine.  Chest, back, or muscle pain.  People around you feel you are not acting correctly or are confused.  Shortness of breath or difficulty breathing.  Dizziness and fainting.  You get a rash or develop hives.  You have a decrease in urine output.  Your urine turns a dark color or changes to pink, red, or brown. Any of the  following symptoms occur over the next 10 days:  You have a temperature by mouth above 102 F (38.9 C), not controlled by medicine.  Shortness of breath.  Weakness after normal activity.  The white part of the eye turns yellow (jaundice).  You have a decrease in the amount of urine or are urinating less often.  Your urine turns a dark color or changes to pink, red, or brown. Document Released: 07/11/2000 Document Revised: 10/06/2011 Document Reviewed: 02/28/2008 ExitCare Patient Information 2014 [place].  _______________________________________________________________________  Incentive Spirometer  An incentive spirometer is a tool that can help keep your lungs clear and active. This tool measures how well you are filling your lungs with each breath. Taking long deep breaths may help reverse or decrease the chance of developing breathing (pulmonary) problems (especially infection) following:  A long period of time when you are unable to move or be active. BEFORE THE PROCEDURE   If the spirometer includes an indicator to show your best effort, your nurse or respiratory therapist will set it to a desired goal.  If possible, sit up straight or lean slightly forward. Try not to slouch.  Hold the incentive spirometer in an upright position. INSTRUCTIONS FOR USE  1. Sit on the edge of your bed if possible, or sit up as far as you can in bed or on a chair. 2. Hold the incentive spirometer in an upright position. 3. Breathe out normally. 4. Place the mouthpiece in your mouth and seal your lips tightly around it. 5. Breathe in slowly and as deeply as possible, raising the piston or the ball toward the top of the column. 6. Hold your breath for 3-5 seconds or for as long as possible. Allow the piston or ball to fall to the bottom of the column. 7. Remove the mouthpiece from your mouth and breathe out normally. 8. Rest for a few seconds and repeat Steps 1 through 7 at least 10  times every 1-2 hours when you are awake. Take your time and take a few normal breaths between deep breaths. 9. The spirometer may include an indicator to show your best effort. Use the indicator as a goal to work toward during each repetition. 10. After each set of 10 deep breaths, practice coughing to be sure your lungs are clear. If you have an incision (the cut made at the time of surgery), support your incision when coughing by placing a pillow or rolled up towels firmly against it. Once you are able to get out of bed, walk around indoors and cough well. You may stop using the incentive spirometer when instructed by your caregiver.  RISKS AND COMPLICATIONS  Take your time so you do not get dizzy or light-headed.  If you are in pain, you may need to take or ask  for pain medication before doing incentive spirometry. It is harder to take a deep breath if you are having pain. AFTER USE  Rest and breathe slowly and easily.  It can be helpful to keep track of a log of your progress. Your caregiver can provide you with a simple table to help with this. If you are using the spirometer at home, follow these instructions: [place] IF:   You are having difficultly using the spirometer.  You have trouble using the spirometer as often as instructed.  Your pain medication is not giving enough relief while using the spirometer.  You develop fever of 100.5 F (38.1 C) or higher. SEEK IMMEDIATE MEDICAL CARE IF:   You cough up bloody sputum that had not been present before.  You develop fever of 102 F (38.9 C) or greater.  You develop worsening pain at or near the incision site. MAKE SURE YOU:   Understand these instructions.  Will watch your condition.  Will get help right away if you are not doing well or get worse. Document Released: 11/24/2006 Document Revised: 10/06/2011 Document Reviewed: 01/25/2007 [facility] Patient Information 2014 [employer],  [place].   ________________________________________________________________________

## 2013-12-12 NOTE — Progress Notes (Signed)
CXR and EKG 2013 on chart, LOV note Dr. Osborne Casco 10/05/13 on chart

## 2013-12-15 LAB — MRSA CULTURE

## 2013-12-15 LAB — TYPE AND SCREEN
ABO/RH(D): O POS
Antibody Screen: NEGATIVE

## 2013-12-16 ENCOUNTER — Encounter (HOSPITAL_COMMUNITY)
Admission: RE | Disposition: A | Discharge status: Home Health | Payer: Self-pay | Source: Ambulatory Visit | Attending: Orthopaedic Surgery

## 2013-12-16 ENCOUNTER — Encounter (HOSPITAL_COMMUNITY): Payer: Medicare HMO | Admitting: Anesthesiology

## 2013-12-16 ENCOUNTER — Inpatient Hospital Stay (HOSPITAL_COMMUNITY): Payer: Medicare HMO | Admitting: Anesthesiology

## 2013-12-16 ENCOUNTER — Inpatient Hospital Stay (HOSPITAL_COMMUNITY): Payer: Medicare HMO

## 2013-12-16 ENCOUNTER — Encounter (HOSPITAL_COMMUNITY): Payer: Self-pay | Admitting: *Deleted

## 2013-12-16 ENCOUNTER — Inpatient Hospital Stay (HOSPITAL_COMMUNITY)
Admission: RE | Admit: 2013-12-16 | Discharge: 2013-12-19 | DRG: 470 | Disposition: A | Discharge status: Home Health | Payer: Medicare HMO | Source: Ambulatory Visit | Attending: Orthopaedic Surgery | Admitting: Orthopaedic Surgery

## 2013-12-16 DIAGNOSIS — Z96649 Presence of unspecified artificial hip joint: Secondary | ICD-10-CM

## 2013-12-16 DIAGNOSIS — I10 Essential (primary) hypertension: Secondary | ICD-10-CM | POA: Diagnosis present

## 2013-12-16 DIAGNOSIS — M949 Disorder of cartilage, unspecified: Secondary | ICD-10-CM

## 2013-12-16 DIAGNOSIS — M12259 Villonodular synovitis (pigmented), unspecified hip: Secondary | ICD-10-CM | POA: Diagnosis present

## 2013-12-16 DIAGNOSIS — M169 Osteoarthritis of hip, unspecified: Principal | ICD-10-CM | POA: Diagnosis present

## 2013-12-16 DIAGNOSIS — D62 Acute posthemorrhagic anemia: Secondary | ICD-10-CM | POA: Diagnosis not present

## 2013-12-16 DIAGNOSIS — Z9849 Cataract extraction status, unspecified eye: Secondary | ICD-10-CM

## 2013-12-16 DIAGNOSIS — M1611 Unilateral primary osteoarthritis, right hip: Secondary | ICD-10-CM

## 2013-12-16 DIAGNOSIS — Z853 Personal history of malignant neoplasm of breast: Secondary | ICD-10-CM

## 2013-12-16 DIAGNOSIS — M1610 Unilateral primary osteoarthritis, unspecified hip: Principal | ICD-10-CM | POA: Diagnosis present

## 2013-12-16 DIAGNOSIS — Z8262 Family history of osteoporosis: Secondary | ICD-10-CM

## 2013-12-16 DIAGNOSIS — M899 Disorder of bone, unspecified: Secondary | ICD-10-CM | POA: Diagnosis present

## 2013-12-16 HISTORY — PX: TOTAL HIP ARTHROPLASTY: SHX124

## 2013-12-16 SURGERY — ARTHROPLASTY, HIP, TOTAL, ANTERIOR APPROACH
Anesthesia: Spinal | Site: Hip | Laterality: Right

## 2013-12-16 MED ORDER — LACTATED RINGERS IV SOLN
INTRAVENOUS | Status: DC | PRN
  Administered 2013-12-16 (×3): via INTRAVENOUS

## 2013-12-16 MED ORDER — CEFAZOLIN SODIUM 1-5 GM-% IV SOLN
1.0000 g | Freq: Four times a day (QID) | INTRAVENOUS | Status: AC
  Administered 2013-12-16 – 2013-12-17 (×2): 1 g via INTRAVENOUS
  Filled 2013-12-16 (×2): qty 50

## 2013-12-16 MED ORDER — HYDROMORPHONE HCL PF 1 MG/ML IJ SOLN
1.0000 mg | INTRAMUSCULAR | Status: DC | PRN
  Administered 2013-12-16: 1 mg via INTRAVENOUS

## 2013-12-16 MED ORDER — SODIUM CHLORIDE 0.9 % IR SOLN
Status: DC | PRN
  Administered 2013-12-16: 1000 mL

## 2013-12-16 MED ORDER — ACETAMINOPHEN 650 MG RE SUPP: 650.0000 mg | Freq: Four times a day (QID) | RECTAL | Status: DC | PRN

## 2013-12-16 MED ORDER — DOCUSATE SODIUM 100 MG PO CAPS
100.0000 mg | ORAL_CAPSULE | Freq: Two times a day (BID) | ORAL | Status: DC
  Administered 2013-12-16 – 2013-12-19 (×5): 100 mg via ORAL
  Filled 2013-12-16 (×5): qty 1

## 2013-12-16 MED ORDER — ACETAMINOPHEN 325 MG PO TABS
650.0000 mg | ORAL_TABLET | Freq: Four times a day (QID) | ORAL | Status: DC | PRN
  Administered 2013-12-18 – 2013-12-19 (×3): 650 mg via ORAL
  Filled 2013-12-16 (×3): qty 2

## 2013-12-16 MED ORDER — TRANEXAMIC ACID 100 MG/ML IV SOLN
1000.0000 mg | INTRAVENOUS | Status: AC
  Administered 2013-12-16: 1000 mg via INTRAVENOUS
  Filled 2013-12-16: qty 10

## 2013-12-16 MED ORDER — METHOCARBAMOL 1000 MG/10ML IJ SOLN
500.0000 mg | Freq: Four times a day (QID) | INTRAMUSCULAR | Status: DC | PRN
  Administered 2013-12-16: 500 mg via INTRAVENOUS
  Filled 2013-12-16: qty 5

## 2013-12-16 MED ORDER — MIDAZOLAM HCL 5 MG/5ML IJ SOLN
INTRAMUSCULAR | Status: DC | PRN
  Administered 2013-12-16: 1 mg via INTRAVENOUS

## 2013-12-16 MED ORDER — ONDANSETRON HCL 4 MG PO TABS: 4.0000 mg | ORAL_TABLET | Freq: Four times a day (QID) | ORAL | Status: DC | PRN

## 2013-12-16 MED ORDER — ONDANSETRON HCL 4 MG/2ML IJ SOLN
4.0000 mg | Freq: Four times a day (QID) | INTRAMUSCULAR | Status: DC | PRN
  Administered 2013-12-17: 4 mg via INTRAVENOUS
  Filled 2013-12-16: qty 2

## 2013-12-16 MED ORDER — HYDROMORPHONE HCL PF 1 MG/ML IJ SOLN
INTRAMUSCULAR | Status: AC
Start: 2013-12-16 — End: 2013-12-16
  Filled 2013-12-16: qty 1

## 2013-12-16 MED ORDER — DIPHENHYDRAMINE HCL 12.5 MG/5ML PO ELIX: 12.5000 mg | ORAL_SOLUTION | ORAL | Status: DC | PRN

## 2013-12-16 MED ORDER — SODIUM CHLORIDE 0.9 % IV SOLN
INTRAVENOUS | Status: DC
  Administered 2013-12-16 – 2013-12-17 (×2): via INTRAVENOUS

## 2013-12-16 MED ORDER — POLYVINYL ALCOHOL 1.4 % OP SOLN
1.0000 [drp] | Freq: Three times a day (TID) | OPHTHALMIC | Status: DC | PRN
  Filled 2013-12-16: qty 15

## 2013-12-16 MED ORDER — LACTATED RINGERS IV SOLN: INTRAVENOUS | Status: DC

## 2013-12-16 MED ORDER — METOCLOPRAMIDE HCL 5 MG/ML IJ SOLN
5.0000 mg | Freq: Three times a day (TID) | INTRAMUSCULAR | Status: DC | PRN
  Administered 2013-12-17: 10 mg via INTRAVENOUS
  Filled 2013-12-16: qty 2

## 2013-12-16 MED ORDER — PHENYLEPHRINE 40 MCG/ML (10ML) SYRINGE FOR IV PUSH (FOR BLOOD PRESSURE SUPPORT)
PREFILLED_SYRINGE | INTRAVENOUS | Status: AC
  Filled 2013-12-16: qty 10

## 2013-12-16 MED ORDER — MENTHOL 3 MG MT LOZG
1.0000 | LOZENGE | OROMUCOSAL | Status: DC | PRN
  Filled 2013-12-16: qty 9

## 2013-12-16 MED ORDER — METOCLOPRAMIDE HCL 10 MG PO TABS: 5.0000 mg | ORAL_TABLET | Freq: Three times a day (TID) | ORAL | Status: DC | PRN

## 2013-12-16 MED ORDER — LISINOPRIL 20 MG PO TABS
20.0000 mg | ORAL_TABLET | Freq: Two times a day (BID) | ORAL | Status: DC
  Administered 2013-12-16 – 2013-12-19 (×4): 20 mg via ORAL
  Filled 2013-12-16 (×8): qty 1

## 2013-12-16 MED ORDER — CHLORTHALIDONE 25 MG PO TABS
25.0000 mg | ORAL_TABLET | Freq: Every morning | ORAL | Status: DC
  Administered 2013-12-18 – 2013-12-19 (×2): 25 mg via ORAL
  Filled 2013-12-16 (×3): qty 1

## 2013-12-16 MED ORDER — METHOCARBAMOL 500 MG PO TABS
500.0000 mg | ORAL_TABLET | Freq: Four times a day (QID) | ORAL | Status: DC | PRN
  Administered 2013-12-16 – 2013-12-19 (×3): 500 mg via ORAL
  Filled 2013-12-16 (×4): qty 1

## 2013-12-16 MED ORDER — POLYETHYLENE GLYCOL 3350 17 G PO PACK
17.0000 g | PACK | Freq: Every day | ORAL | Status: DC | PRN
  Filled 2013-12-16: qty 1

## 2013-12-16 MED ORDER — PROPOFOL 10 MG/ML IV BOLUS
INTRAVENOUS | Status: AC
  Filled 2013-12-16: qty 20

## 2013-12-16 MED ORDER — EPHEDRINE SULFATE 50 MG/ML IJ SOLN
INTRAMUSCULAR | Status: DC | PRN
  Administered 2013-12-16: 15 mg via INTRAVENOUS

## 2013-12-16 MED ORDER — LIDOCAINE HCL 1 % IJ SOLN
INTRAMUSCULAR | Status: AC
  Filled 2013-12-16: qty 20

## 2013-12-16 MED ORDER — CEFAZOLIN SODIUM-DEXTROSE 2-3 GM-% IV SOLR
INTRAVENOUS | Status: AC
  Filled 2013-12-16: qty 50

## 2013-12-16 MED ORDER — PHENOL 1.4 % MT LIQD
1.0000 | OROMUCOSAL | Status: DC | PRN
  Filled 2013-12-16: qty 177

## 2013-12-16 MED ORDER — CISATRACURIUM BESYLATE 20 MG/10ML IV SOLN
INTRAVENOUS | Status: AC
  Filled 2013-12-16: qty 10

## 2013-12-16 MED ORDER — VITAMIN D3 25 MCG (1000 UNIT) PO TABS
1000.0000 [IU] | ORAL_TABLET | Freq: Every day | ORAL | Status: DC
  Administered 2013-12-18 – 2013-12-19 (×2): 1000 [IU] via ORAL
  Filled 2013-12-16 (×4): qty 1

## 2013-12-16 MED ORDER — PROPOFOL 10 MG/ML IV BOLUS
INTRAVENOUS | Status: DC | PRN
  Administered 2013-12-16: 20 mg via INTRAVENOUS

## 2013-12-16 MED ORDER — FENTANYL CITRATE 0.05 MG/ML IJ SOLN
INTRAMUSCULAR | Status: DC | PRN
  Administered 2013-12-16: 50 ug via INTRAVENOUS

## 2013-12-16 MED ORDER — BUPIVACAINE HCL (PF) 0.5 % IJ SOLN
INTRAMUSCULAR | Status: AC
  Filled 2013-12-16: qty 30

## 2013-12-16 MED ORDER — ALUM & MAG HYDROXIDE-SIMETH 200-200-20 MG/5ML PO SUSP
30.0000 mL | ORAL | Status: DC | PRN
  Administered 2013-12-17: 30 mL via ORAL
  Filled 2013-12-16: qty 30

## 2013-12-16 MED ORDER — ASPIRIN EC 325 MG PO TBEC
325.0000 mg | DELAYED_RELEASE_TABLET | Freq: Two times a day (BID) | ORAL | Status: DC
  Administered 2013-12-16 – 2013-12-19 (×5): 325 mg via ORAL
  Filled 2013-12-16 (×8): qty 1

## 2013-12-16 MED ORDER — HYDROMORPHONE HCL PF 1 MG/ML IJ SOLN
0.2500 mg | INTRAMUSCULAR | Status: DC | PRN
  Administered 2013-12-16: 0.25 mg via INTRAVENOUS

## 2013-12-16 MED ORDER — SORBITOL 70 % SOLN
30.0000 mL | Freq: Every day | Status: DC | PRN
  Filled 2013-12-16: qty 30

## 2013-12-16 MED ORDER — VITAMIN C 500 MG PO TABS
1000.0000 mg | ORAL_TABLET | Freq: Every day | ORAL | Status: DC
  Administered 2013-12-18 – 2013-12-19 (×2): 1000 mg via ORAL
  Filled 2013-12-16 (×4): qty 2

## 2013-12-16 MED ORDER — HYDROMORPHONE HCL PF 1 MG/ML IJ SOLN
INTRAMUSCULAR | Status: AC
  Filled 2013-12-16: qty 1

## 2013-12-16 MED ORDER — PROPOFOL 10 MG/ML IV BOLUS: INTRAVENOUS | Status: DC | PRN

## 2013-12-16 MED ORDER — OCUVITE-LUTEIN PO CAPS
1.0000 | ORAL_CAPSULE | Freq: Every day | ORAL | Status: DC
  Administered 2013-12-18 – 2013-12-19 (×2): 1 via ORAL
  Filled 2013-12-16 (×4): qty 1

## 2013-12-16 MED ORDER — CEFAZOLIN SODIUM-DEXTROSE 2-3 GM-% IV SOLR
2.0000 g | INTRAVENOUS | Status: AC
  Administered 2013-12-16: 2 g via INTRAVENOUS

## 2013-12-16 MED ORDER — LACTATED RINGERS IV SOLN
INTRAVENOUS | Status: DC
  Administered 2013-12-16: 1000 mL via INTRAVENOUS

## 2013-12-16 MED ORDER — BUPIVACAINE HCL (PF) 0.5 % IJ SOLN
INTRAMUSCULAR | Status: DC | PRN
  Administered 2013-12-16: 15 mg

## 2013-12-16 MED ORDER — 0.9 % SODIUM CHLORIDE (POUR BTL) OPTIME
TOPICAL | Status: DC | PRN
  Administered 2013-12-16: 1000 mL

## 2013-12-16 MED ORDER — ONDANSETRON HCL 4 MG/2ML IJ SOLN
INTRAMUSCULAR | Status: AC
  Filled 2013-12-16: qty 2

## 2013-12-16 MED ORDER — PROPOFOL INFUSION 10 MG/ML OPTIME
INTRAVENOUS | Status: DC | PRN
  Administered 2013-12-16: 140 ug/kg/min via INTRAVENOUS

## 2013-12-16 MED ORDER — OXYCODONE HCL 5 MG PO TABS
5.0000 mg | ORAL_TABLET | ORAL | Status: DC | PRN
  Administered 2013-12-16 – 2013-12-17 (×4): 10 mg via ORAL
  Filled 2013-12-16 (×4): qty 2

## 2013-12-16 MED ORDER — FENTANYL CITRATE 0.05 MG/ML IJ SOLN
INTRAMUSCULAR | Status: AC
Start: 2013-12-16 — End: 2013-12-16
  Filled 2013-12-16: qty 5

## 2013-12-16 MED ORDER — MIDAZOLAM HCL 2 MG/2ML IJ SOLN
INTRAMUSCULAR | Status: AC
  Filled 2013-12-16: qty 2

## 2013-12-16 SURGICAL SUPPLY — 42 items
APL SKNCLS STERI-STRIP NONHPOA (GAUZE/BANDAGES/DRESSINGS) ×1
BAG SPEC THK2 15X12 ZIP CLS (MISCELLANEOUS)
BAG ZIPLOCK 12X15 (MISCELLANEOUS) IMPLANT
BENZOIN TINCTURE PRP APPL 2/3 (GAUZE/BANDAGES/DRESSINGS) ×1 IMPLANT
BLADE SAW SGTL 18X1.27X75 (BLADE) ×2 IMPLANT
CAPT HIP PF COP ×1 IMPLANT
CELLS DAT CNTRL 66122 CELL SVR (MISCELLANEOUS) ×1 IMPLANT
COVER PERINEAL POST (MISCELLANEOUS) ×2 IMPLANT
DRAPE C-ARM 42X120 X-RAY (DRAPES) ×2 IMPLANT
DRAPE STERI IOBAN 125X83 (DRAPES) ×2 IMPLANT
DRAPE U-SHAPE 47X51 STRL (DRAPES) ×6 IMPLANT
DRSG AQUACEL AG ADV 3.5X10 (GAUZE/BANDAGES/DRESSINGS) ×2 IMPLANT
DURAPREP 26ML APPLICATOR (WOUND CARE) ×2 IMPLANT
ELECT BLADE TIP CTD 4 INCH (ELECTRODE) ×2 IMPLANT
ELECT REM PT RETURN 9FT ADLT (ELECTROSURGICAL) ×2
ELECTRODE REM PT RTRN 9FT ADLT (ELECTROSURGICAL) ×1 IMPLANT
FACESHIELD WRAPAROUND (MASK) ×8 IMPLANT
FACESHIELD WRAPAROUND OR TEAM (MASK) ×4 IMPLANT
GAUZE XEROFORM 1X8 LF (GAUZE/BANDAGES/DRESSINGS) IMPLANT
GLOVE BIO SURGEON STRL SZ7.5 (GLOVE) ×2 IMPLANT
GLOVE BIOGEL PI IND STRL 8 (GLOVE) ×2 IMPLANT
GLOVE BIOGEL PI INDICATOR 8 (GLOVE) ×2
GLOVE ECLIPSE 8.0 STRL XLNG CF (GLOVE) ×2 IMPLANT
GOWN STRL REUS W/TWL XL LVL3 (GOWN DISPOSABLE) ×4 IMPLANT
HANDPIECE INTERPULSE COAX TIP (DISPOSABLE) ×2
KIT BASIN OR (CUSTOM PROCEDURE TRAY) ×2 IMPLANT
PACK TOTAL JOINT (CUSTOM PROCEDURE TRAY) ×2 IMPLANT
RETRACTOR WND ALEXIS 18 MED (MISCELLANEOUS) ×1 IMPLANT
RTRCTR WOUND ALEXIS 18CM MED (MISCELLANEOUS) ×2
SET HNDPC FAN SPRY TIP SCT (DISPOSABLE) ×1 IMPLANT
STAPLER VISISTAT 35W (STAPLE) IMPLANT
STRIP CLOSURE SKIN 1/2X4 (GAUZE/BANDAGES/DRESSINGS) ×1 IMPLANT
SUT ETHIBOND NAB CT1 #1 30IN (SUTURE) ×1 IMPLANT
SUT ETHILON 3 0 PS 1 (SUTURE) IMPLANT
SUT MNCRL AB 4-0 PS2 18 (SUTURE) ×2 IMPLANT
SUT VIC AB 0 CT1 36 (SUTURE) ×2 IMPLANT
SUT VIC AB 1 CT1 36 (SUTURE) ×2 IMPLANT
SUT VIC AB 2-0 CT1 27 (SUTURE) ×4
SUT VIC AB 2-0 CT1 TAPERPNT 27 (SUTURE) ×2 IMPLANT
TOWEL OR 17X26 10 PK STRL BLUE (TOWEL DISPOSABLE) ×2 IMPLANT
TOWEL OR NON WOVEN STRL DISP B (DISPOSABLE) ×2 IMPLANT
TRAY FOLEY CATH 14FRSI W/METER (CATHETERS) IMPLANT

## 2013-12-16 NOTE — Brief Op Note (Signed)
12/16/2013  1:52 PM  PATIENT:  Doris Ford  73 y.o. female  PRE-OPERATIVE DIAGNOSIS:  Right hip arthritis  POST-OPERATIVE DIAGNOSIS:  Right hip arthritis  PROCEDURE:  Procedure(s): RIGHT TOTAL HIP ARTHROPLASTY ANTERIOR APPROACH (Right)  SURGEON:  Surgeon(s) and Role:    * Mcarthur Rossetti, MD - Primary  PHYSICIAN ASSISTANT: Benita Stabile, PA-C  ANESTHESIA:   spinal  EBL:  Total I/O In: 2000 [I.V.:2000] Out: 375 [Urine:125; Blood:250]  BLOOD ADMINISTERED:none  DRAINS: none   LOCAL MEDICATIONS USED:  NONE  SPECIMEN:  No Specimen  DISPOSITION OF SPECIMEN:  N/A  COUNTS:  YES  TOURNIQUET:  * No tourniquets in log *  DICTATION: .Other Dictation: Dictation Number [id number]  PLAN OF CARE: Admit to inpatient   PATIENT DISPOSITION:  PACU - hemodynamically stable.   Delay start of Pharmacological VTE agent (>24hrs) due to surgical blood loss or risk of bleeding: no

## 2013-12-16 NOTE — Anesthesia Procedure Notes (Signed)
Spinal  Patient location during procedure: OR Start time: 12/16/2013 12:10 PM End time: 12/16/2013 12:16 PM Staffing Anesthesiologist: Rod Mae L Performed by: anesthesiologist  Preanesthetic Checklist Completed: patient identified, site marked, surgical consent, pre-op evaluation, timeout performed, IV checked, risks and benefits discussed and monitors and equipment checked Spinal Block Patient position: sitting Prep: Betadine Patient monitoring: heart rate, continuous pulse ox and blood pressure Approach: midline Location: L3-4 Injection technique: single-shot Needle Needle type: Spinocan  Needle gauge: 22 G Needle length: 9 cm Assessment Sensory level: T6 Additional Notes Expiration date of kit checked and confirmed. Patient tolerated procedure well, without complications.

## 2013-12-16 NOTE — H&P (Signed)
TOTAL HIP ADMISSION H&P  Patient is admitted for right total hip arthroplasty.  Subjective:  Chief Complaint: right hip pain  HPI: Doris Ford, 73 y.o. female, has a history of pain and functional disability in the right hip(s) due to arthritis and patient has failed non-surgical conservative treatments for greater than 12 weeks to include NSAID's and/or analgesics, corticosteriod injections, use of assistive devices and activity modification.  Onset of symptoms was gradual starting 5 years ago with gradually worsening course since that time.The patient noted no past surgery on the right hip(s).  Patient currently rates pain in the right hip at 10 out of 10 with activity. Patient has night pain, worsening of pain with activity and weight bearing, trendelenberg gait, pain that interfers with activities of daily living and pain with passive range of motion. Patient has evidence of subchondral sclerosis, periarticular osteophytes and joint space narrowing by imaging studies. This condition presents safety issues increasing the risk of falls.  There is no current active infection.  Patient Active Problem List   Diagnosis Date Noted  . Arthritis of right hip 12/16/2013   Past Medical History  Diagnosis Date  . Interstitial cystitis   . Osteopenia     no change  . Cancer 12/07    right breast/tx with lumpectomy and tamoxifen  . Hypertension     under control  . Arthritis     Past Surgical History  Procedure Laterality Date  . Total vaginal hysterectomy  age 46  . Bilateral salpingoophorectomy  age 49s  . Knee surgery Bilateral as teen  . Bunionectomy with hammertoe reconstruction Bilateral ~2000  . Tonsillectomy and adenoidectomy  age 4  . Breast surgery Right     lumpectomy-multiple times  . Cataract extraction Bilateral 2007    Prescriptions prior to admission  Medication Sig Dispense Refill  . Ascorbic Acid (VITAMIN C PO) Take 1,000 mg by mouth daily.       . Calcium-Vitamin  D (CALTRATE 600 PLUS-VIT D PO) Take 600 mg by mouth daily.      . chlorthalidone (HYGROTON) 25 MG tablet Take 25 mg by mouth every morning.       . Cholecalciferol (VITAMIN D PO) Take 1,000 Units by mouth daily.       . Estradiol 10 MCG TABS vaginal tablet Place 10 mcg vaginally every 14 (fourteen) days.       . hydroxypropyl methylcellulose (ISOPTO TEARS) 2.5 % ophthalmic solution Place 1 drop into both eyes 3 (three) times daily as needed for dry eyes.      . indomethacin (INDOCIN) 25 MG capsule Take 25-50 mg by mouth every 6 (six) hours as needed for moderate pain.       . Multiple Vitamins-Minerals (OCUVITE PO) Take 1 tablet by mouth daily.       . Naproxen Sodium (ALEVE PO) Take 220 mg by mouth 2 (two) times daily as needed (pain).       . Omega-3 Fatty Acids (OMEGA 3 PO) Take 1 g by mouth daily.       [name] OVER THE COUNTER MEDICATION Take 1 tablet by mouth 2 (two) times daily. Cysta Q supplyment      . quinapril (ACCUPRIL) 20 MG tablet Take 20 mg by mouth 2 (two) times daily.       No Known Allergies  History  Substance Use Topics  . Smoking status: Never Smoker   . Smokeless tobacco: Never Used  . Alcohol Use: No    Family History  Problem Relation Age of Onset  . Cancer Mother     breast/mastectomy  . Hypertension Father   . Heart disease Mother     poor circulation  . Deep vein thrombosis Mother   . Osteoporosis Mother      Review of Systems  Musculoskeletal: Positive for back pain and joint pain.  All other systems reviewed and are negative.   Objective:  Physical Exam  Constitutional: She is oriented to person, place, and time. She appears well-developed and well-nourished.  HENT:  Head: Normocephalic and atraumatic.  Eyes: EOM are normal. Pupils are equal, round, and reactive to light.  Neck: Normal range of motion. Neck supple.  Cardiovascular: Normal rate and regular rhythm.   Respiratory: Effort normal and breath sounds normal.  GI: Soft. Bowel sounds are  normal.  Musculoskeletal:       Right hip: She exhibits decreased range of motion, decreased strength and bony tenderness.  Neurological: She is alert and oriented to person, place, and time.  Skin: Skin is warm and dry.  Psychiatric: She has a normal mood and affect.    Vital signs in last 24 hours: Temp:  [97.6 F (36.4 C)] 97.6 F (36.4 C) (05/22 0927) Pulse Rate:  [99] 99 (05/22 0927) Resp:  [18] 18 (05/22 0927) BP: (128)/(79) 128/79 mmHg (05/22 0927) SpO2:  [99 %] 99 % (05/22 0927)  Labs:   Estimated body mass index is 20.24 kg/(m^2) as calculated from the following:   Height as of 12/12/13: 5\' 4"  (1.626 m).   Weight as of 10/14/13: 53.524 kg (118 lb).   Imaging Review Plain radiographs demonstrate moderate degenerative joint disease of the right hip(s). The bone quality appears to be good for age and reported activity level.  Assessment/Plan:  End stage arthritis, right hip(s)  The patient history, physical examination, clinical judgement of the provider and imaging studies are consistent with end stage degenerative joint disease of the right hip(s) and total hip arthroplasty is deemed medically necessary. The treatment options including medical management, injection therapy, arthroscopy and arthroplasty were discussed at length. The risks and benefits of total hip arthroplasty were presented and reviewed. The risks due to aseptic loosening, infection, stiffness, dislocation/subluxation,  thromboembolic complications and other imponderables were discussed.  The patient acknowledged the explanation, agreed to proceed with the plan and consent was signed. Patient is being admitted for inpatient treatment for surgery, pain control, PT, OT, prophylactic antibiotics, VTE prophylaxis, progressive ambulation and ADL's and discharge planning.The patient is planning to be discharged home with home health services

## 2013-12-16 NOTE — Anesthesia Postprocedure Evaluation (Signed)
  Anesthesia Post-op Note  Patient: Doris Ford  Procedure(s) Performed: Procedure(s) (LRB): RIGHT TOTAL HIP ARTHROPLASTY ANTERIOR APPROACH (Right)  Patient Location: PACU  Anesthesia Type: Spinal  Level of Consciousness: awake and alert   Airway and Oxygen Therapy: Patient Spontanous Breathing  Post-op Pain: mild  Post-op Assessment: Post-op Vital signs reviewed, Patient's Cardiovascular Status Stable, Respiratory Function Stable, Patent Airway and No signs of Nausea or vomiting  Last Vitals:  Filed Vitals:   12/16/13 1530  BP: 148/88  Pulse: 65  Temp: 36.3 C  Resp: 15    Post-op Vital Signs: stable   Complications: No apparent anesthesia complications

## 2013-12-16 NOTE — Transfer of Care (Signed)
Immediate Anesthesia Transfer of Care Note  Patient: Doris Ford  Procedure(s) Performed: Procedure(s): RIGHT TOTAL HIP ARTHROPLASTY ANTERIOR APPROACH (Right)  Patient Location: PACU  Anesthesia Type:Regional and Spinal  Level of Consciousness: awake, sedated and patient cooperative  Airway & Oxygen Therapy: Patient Spontanous Breathing and Patient connected to face mask oxygen  Post-op Assessment: Report given to PACU RN and Post -op Vital signs reviewed and stable  Post vital signs: Reviewed and stable  Complications: No apparent anesthesia complications

## 2013-12-16 NOTE — Anesthesia Preprocedure Evaluation (Addendum)
Anesthesia Evaluation  Patient identified by MRN, date of birth, ID band Patient awake    Reviewed: Allergy & Precautions, H&P , NPO status , Patient's Chart, lab work & pertinent test results  Airway Mallampati: II TM Distance: >3 FB Neck ROM: full    Dental no notable dental hx. (+) Dental Advisory Given, Teeth Intact   Pulmonary neg pulmonary ROS,  breath sounds clear to auscultation  Pulmonary exam normal       Cardiovascular Exercise Tolerance: Good hypertension, Pt. on medications Rhythm:regular Rate:Normal  RBBB   Neuro/Psych negative neurological ROS  negative psych ROS   GI/Hepatic negative GI ROS, Neg liver ROS,   Endo/Other  negative endocrine ROS  Renal/GU negative Renal ROS  negative genitourinary   Musculoskeletal   Abdominal   Peds  Hematology negative hematology ROS (+)   Anesthesia Other Findings   Reproductive/Obstetrics negative OB ROS                          Anesthesia Physical Anesthesia Plan  ASA: II  Anesthesia Plan: Spinal   Post-op Pain Management:    Induction:   Airway Management Planned: Simple Face Mask  Additional Equipment:   Intra-op Plan:   Post-operative Plan:   Informed Consent: I have reviewed the patients History and Physical, chart, labs and discussed the procedure including the risks, benefits and alternatives for the proposed anesthesia with the patient or authorized representative who has indicated his/her understanding and acceptance.   Dental Advisory Given  Plan Discussed with: CRNA and Surgeon  Anesthesia Plan Comments:        Anesthesia Quick Evaluation

## 2013-12-17 LAB — CBC
HEMATOCRIT: 31.9 % — AB (ref 36.0–46.0)
Hemoglobin: 11.1 g/dL — ABNORMAL LOW (ref 12.0–15.0)
MCH: 33.1 pg (ref 26.0–34.0)
MCHC: 34.8 g/dL (ref 30.0–36.0)
MCV: 95.2 fL (ref 78.0–100.0)
Platelets: 179 10*3/uL (ref 150–400)
RBC: 3.35 MIL/uL — ABNORMAL LOW (ref 3.87–5.11)
RDW: 12.4 % (ref 11.5–15.5)
WBC: 9.2 10*3/uL (ref 4.0–10.5)

## 2013-12-17 LAB — BASIC METABOLIC PANEL
BUN: 12 mg/dL (ref 6–23)
CO2: 30 mEq/L (ref 19–32)
CREATININE: 0.67 mg/dL (ref 0.50–1.10)
Calcium: 8.6 mg/dL (ref 8.4–10.5)
Chloride: 95 mEq/L — ABNORMAL LOW (ref 96–112)
GFR, EST NON AFRICAN AMERICAN: 84 mL/min — AB (ref >90)
Glucose, Bld: 149 mg/dL — ABNORMAL HIGH (ref 70–99)
POTASSIUM: 3.3 meq/L — AB (ref 3.7–5.3)
Sodium: 135 mEq/L — ABNORMAL LOW (ref 137–147)

## 2013-12-17 MED ORDER — PROMETHAZINE HCL 25 MG/ML IJ SOLN: 12.5000 mg | Freq: Four times a day (QID) | INTRAMUSCULAR | Status: DC | PRN

## 2013-12-17 MED ORDER — KETOROLAC TROMETHAMINE 30 MG/ML IJ SOLN
30.0000 mg | Freq: Four times a day (QID) | INTRAMUSCULAR | Status: DC
  Administered 2013-12-17 – 2013-12-18 (×4): 30 mg via INTRAVENOUS
  Filled 2013-12-17 (×4): qty 1
  Filled 2013-12-17: qty 2
  Filled 2013-12-17 (×2): qty 1

## 2013-12-17 NOTE — Progress Notes (Signed)
12/17/13 1353  PT Visit Information  Last PT Received On 12/17/13  Assistance Needed +1  History of Present Illness s/p R DA THA  PT Time Calculation  PT Start Time 1200  PT Stop Time 1220  PT Time Calculation (min) 20 min  Subjective Data  Patient Stated Goal I  Precautions  Precautions None  Restrictions  Weight Bearing Restrictions No  Other Position/Activity Restrictions WBAT  Cognition  Arousal/Alertness Awake/alert  Behavior During Therapy WFL for tasks assessed/performed  Overall Cognitive Status Within Functional Limits for tasks assessed  Bed Mobility  Overal bed mobility Needs Assistance  Bed Mobility Sit to Supine  Sit to supine Min assist  General bed mobility comments cues for technique  Transfers  Overall transfer level Needs assistance  Equipment used Rolling walker (2 wheeled)  Transfers Sit to/from Stand  Sit to Stand Min guard  General transfer comment cues for hand placement  Ambulation/Gait  Ambulation/Gait assistance Min guard  Ambulation Distance (Feet) 50 Feet  Assistive device Rolling walker (2 wheeled)  Gait Pattern/deviations Step-to pattern  General Gait Details cues for sequence and RW position form self  Total Joint Exercises  Heel Slides AROM;Right;15 reps  PT - End of Session  Equipment Utilized During Treatment Gait belt  Activity Tolerance Patient tolerated treatment well  Patient left in bed;with call bell/phone within reach;with family/visitor present  Nurse Communication Mobility status  PT - Assessment/Plan  PT Plan Current plan remains appropriate  PT Frequency 7X/week  Follow Up Recommendations Home health PT  PT equipment Rolling walker with 5" wheels  PT Goal Progression  Progress towards PT goals Progressing toward goals  Acute Rehab PT Goals  PT Goal Formulation With patient  Time For Goal Achievement 12/20/13  Potential to Achieve Goals Good  PT General Charges  $$ ACUTE PT VISIT 1 Procedure  PT Treatments  $Gait  Training 8-22 mins

## 2013-12-17 NOTE — Progress Notes (Signed)
Pt vomited approx 151ml undigested food after OOB to [facility]. Noted white tablet in emesis-- pt had just received PO Robaxin. Zofran IV given. Comfort care provided. Will monitor.

## 2013-12-17 NOTE — Progress Notes (Signed)
Dr Lorin Mercy in to see pt.  Aware K=3.3.  No new orders received.

## 2013-12-17 NOTE — Progress Notes (Signed)
Subjective: 1 Day Post-Op Procedure(s) (LRB): RIGHT TOTAL HIP ARTHROPLASTY ANTERIOR APPROACH (Right) Patient reports pain as moderate.    Objective: Vital signs in last 24 hours: Temp:  [96.2 F (35.7 C)-99.5 F (37.5 C)] 97.8 F (36.6 C) (05/23 1124) Pulse Rate:  [55-81] 78 (05/23 1124) Resp:  [12-18] 18 (05/23 1124) BP: (96-148)/(56-90) 97/59 mmHg (05/23 1124) SpO2:  [92 %-100 %] 94 % (05/23 1124) Weight:  [52.617 kg (116 lb)] 52.617 kg (116 lb) (05/22 1544)  Intake/Output from previous day: 05/22 0701 - 05/23 0700 In: 4440 [P.O.:360; I.V.:3915; IV Piggyback:165] Out: 0102 [Urine:1325; Blood:250] Intake/Output this shift: Total I/O In: -  Out: 275 [Urine:275]   Recent Labs  12/17/13 0540  HGB 11.1*    Recent Labs  12/17/13 0540  WBC 9.2  RBC 3.35*  HCT 31.9*  PLT 179    Recent Labs  12/17/13 0540  NA 135*  K 3.3*  CL 95*  CO2 30  BUN 12  CREATININE 0.67  GLUCOSE 149*  CALCIUM 8.6   No results found for this basename: LABPT, INR,  in the last 72 hours  Neurologically intact  Assessment/Plan: 1 Day Post-Op Procedure(s) (LRB): RIGHT TOTAL HIP ARTHROPLASTY ANTERIOR APPROACH (Right) Up with therapy    Had problems with N and V , placed on toradol which helped .    IV 5 doses ordered.   Marybelle Killings 12/17/2013, 1:14 PM

## 2013-12-17 NOTE — Evaluation (Signed)
Physical Therapy Evaluation Patient Details Name: REXANNE INOCENCIO MRN: [id number] DOB: 11-02-1940 Today's Date: 12/17/2013   History of Present Illness  s/p R DA THA  Clinical Impression  Will benefit from PT to address deficits below, plan is for HHPT    Follow Up Recommendations Home health PT    Equipment Recommendations  Rolling walker with 5" wheels    Recommendations for Other Services       Precautions / Restrictions Precautions Precautions: None Restrictions Other Position/Activity Restrictions: WBAT      Mobility  Bed Mobility Overal bed mobility: Needs Assistance Bed Mobility: Supine to Sit     Supine to sit: Min guard        Transfers Overall transfer level: Needs assistance Equipment used: Rolling walker (2 wheeled) Transfers: Sit to/from Stand Sit to Stand: Min guard         General transfer comment: cues for hand placement  Ambulation/Gait Ambulation/Gait assistance: Min guard Ambulation Distance (Feet): 18 Feet Assistive device: Rolling walker (2 wheeled)       General Gait Details: cues for sequence  Stairs            Wheelchair Mobility    Modified Rankin (Stroke Patients Only)       Balance                                             Pertinent Vitals/Pain Min c/o pain;    Home Living Family/patient expects to be discharged to:: Private residence Living Arrangements: Spouse/significant other   Type of Home: House Home Access: Stairs to enter   Technical brewer of Steps: 2 Home Layout: One level Home Equipment: Environmental consultant - standard      Prior Function Level of Independence: Independent               Hand Dominance        Extremity/Trunk Assessment   Upper Extremity Assessment: Overall WFL for tasks assessed             RLE Deficits / Details: AAROM grossly within limits of pain; ankle and knee WFL       Communication   Communication: No difficulties  Cognition  Arousal/Alertness: Awake/alert Behavior During Therapy: WFL for tasks assessed/performed Overall Cognitive Status: Within Functional Limits for tasks assessed                      General Comments      Exercises Total Joint Exercises Ankle Circles/Pumps: AROM;Both;10 reps      Assessment/Plan    PT Assessment Patient needs continued PT services  PT Diagnosis Difficulty walking   PT Problem List Decreased strength;Decreased range of motion;Decreased activity tolerance;Decreased balance;Decreased mobility;Decreased knowledge of use of DME  PT Treatment Interventions DME instruction;Gait training;Functional mobility training;Therapeutic activities;Therapeutic exercise   PT Goals (Current goals can be found in the Care Plan section) Acute Rehab PT Goals Patient Stated Goal: I PT Goal Formulation: With patient Time For Goal Achievement: 12/20/13 Potential to Achieve Goals: Good    Frequency 7X/week   Barriers to discharge        Co-evaluation               End of Session Equipment Utilized During Treatment: Gait belt Activity Tolerance: Patient tolerated treatment well (slightly nauseated but better than earlier) Patient left: in chair;with family/visitor present;with call bell/phone  within reach Nurse Communication: Mobility status         Time: 0927-0958 PT Time Calculation (min): 31 min   Charges:   PT Evaluation $Initial PT Evaluation Tier I: 1 Procedure PT Treatments $Gait Training: 8-22 mins $Therapeutic Activity: 8-22 mins   PT G Codes:          Neil Crouch 12/17/2013, 1:47 PM

## 2013-12-17 NOTE — Op Note (Signed)
NAMEMASSA, PE NO.:  [id number]  MEDICAL RECORD NO.:  [id number]  LOCATION:  1829                         FACILITY:  [facility]  PHYSICIAN:  Lind Guest. Ninfa Linden, M.D.DATE OF BIRTH:  May 08, 1941  DATE OF PROCEDURE:  12/16/2013 DATE OF DISCHARGE:                              OPERATIVE REPORT   PREOPERATIVE DIAGNOSIS:  Moderate arthritis, right hip.  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  PROCEDURE:  Right total hip arthroplasty through direct anterior approach.  IMPLANTS:  DePuy Sector Gription acetabular component size 50 with apex hole eliminator guide and a single screw, size 32+ 0 neutral polyethylene liner, size 8 Corail femoral component with standard offset, size 32+ 1 ceramic hip ball.  SURGEON:  Lind Guest. Ninfa Linden, M.D.  ASSISTANT:  Erskine Emery, PA-C  ANESTHESIA:  Spinal.  BLOOD LOSS:  250 mL.  ANTIBIOTICS:  2 g IV Ancef.  COMPLICATIONS:  None.  INDICATIONS:  Doris Ford is a very pleasant 73 year old female with debilitating pain involving her right hip that she has had for some time.  She has been seen by pain specialist for her back and has had some injections in her right hip, did help temporarily.  Plain films showed just some mild arthritic changes, but her pain was severe with internal and external rotation.  An MRI was obtained and showed more moderate arthritis.  Due to severity of her pain, she wished to proceed with a total hip arthroplasty.  Due to the fact that her pain was certain to affect her activities of daily living, her mobility was quite limiting and her pain was quite severe.  The risks and benefits of the surgery were explained to her in detail including the risk of acute blood loss anemia, nerve or vessel injury, fracture, infection, dislocation, and DVT.  She understands the goals of decreased pain, improved mobility, and overall  improved quality of life.  PROCEDURE DESCRIPTION:  After informed consent was obtained, appropriate right hip was marked.  She was brought to the operating room and spinal anesthesia was obtained, while she was on her stretcher.  Foley catheter was placed and then traction boots were placed on both of her feet. Next, she was placed supine on the Hana fracture table with a perineal post in place and both legs inline with skeletal traction devices, but no traction applied.  Her right operative hip was prepped and draped with DuraPrep and sterile drapes.  A time-out was called to identify the correct patient, correct right hip.  We then made an incision inferior and posterior to the anterior-superior iliac spine in the frontal leg and carried this obliquely down the leg.  We dissected down to the tensor fascia lata muscle and then tensor fascia was easily divided longitudinally to proceed with a direct anterior approach to the hip. We cauterized the lateral femoral circumflex vessels and I put a Cobra retractor around the lateral neck and up underneath the rectus femoris, a Cobra retractor medially.  I opened up the hip capsule and found a large joint effusion and was mainly dark effusion-type of fluid.  We made our capsular cut and we were able to  put the retractors within the hip capsule.  I then made my femoral neck cut just proximal to the lesser trochanter using the oscillating saw and completed this with an osteotome.  I placed a corkscrew guide in the femoral head and removed the femoral head in its entirety and found areas devoid of cartilage, but others areas were just completely dark and stained with what appeared to be a pigmented villonodular synovitis type of situation.  We then irrigated the hip joint and removed remnants of the acetabular labrum.  I placed a bent Hohmann medially and a Cobra retractor laterally.  I then began reaming from a size 42 reamer in 2 mm increments  up to a size 50 with all reamers under direct visualization and last 2 reamers also under direct fluoroscopy, so we could obtain our depth of reaming, our inclination, and anteversion.  Once we were pleased with this, we placed the real DePuy Sector Gription acetabular component size 50, an apex hole eliminator guide in a single screw. Attention was then turned to the femur.  We next placed a 32+ 0 neutral polyethylene liner.  Attention was then turned to the femur.  With her leg externally rotated to 100 degrees extended and abducted, we were able to place a Mueller retractor medially and a Hohmann retractor behind the greater trochanter.  I released the lateral joint capsule and then used a box cutting osteotome to enter the femoral canal and a rongeur to lateralize.  We used a starter broach and then a size 8 broach only using the Corail broaching system and that fit the canal snugly.  We trialed a standard neck and a 32+ 1 hip ball, we brought the leg back over up with traction and internal rotation, reduced in the pelvis.  This was the shortest that we could offer having made our neck cut as well as it would go as well.  It had a nice tight fit and past 90 degrees of external rotation was still stable and internal rotation at 40 degrees was stable.  She had minimal __________ shock and her leg lengths were measured __________ under fluoroscopy as well as her offsets.  We then dislocated the hip and removed the trial components. I placed a real Corail femoral component with standard offset and a 32+ 1 ceramic hip ball.  We rolled the leg back over and up again 1 more time and reduced in the pelvis and it was stable.  We copiously irrigated the soft tissue in the joint with normal saline solution using pulsatile lavage.  We closed the joint capsule with interrupted #1 Ethibond suture followed by running #1 Vicryl in the tensor fascia, 0 Vicryl in the deep tissue, 2-0 Vicryl in the  subcutaneous tissue, 4-0 Monocryl subcuticular stitch, and Steri-Strips on the skin.  An Aquacel dressing was then applied.  She was taken off the Hana table in the recovery room in stable condition.  All final counts were correct. There were no complications noted.  Postoperatively, we will have her get up with therapy with weightbearing as tolerated and increase her activities as she tolerates to therapy in the hospital for the next few days.  Of note, Erskine Emery PA-C did assist in her entire case and assistance was crucial for facilitating this case.     Lind Guest. Ninfa Linden, M.D.     CYB/MEDQ  D:  12/16/2013  T:  12/17/2013  Job:  [id number]

## 2013-12-17 NOTE — Progress Notes (Signed)
Pt struggling w/ N/V and lightheadedness w/ambulation all morning despite zofran and reglan. States she has experienced the same w/ prior use of narcotic pain med. Dr Lorin Mercy aware. Orders received for Toradol ATC and Phenergan PRN. Pt aware and agreeable.

## 2013-12-17 NOTE — Progress Notes (Signed)
OT Cancellation Note  Patient Details Name: AMANIE MCCULLEY MRN: [id number] DOB: 1941-05-27   Cancelled Treatment:    Reason Eval/Treat Not Completed: Medical issues which prohibited therapy (Pt is sick with vomiting) Will recheck on pt later or next day  Betsy Pries 12/17/2013, 10:38 AM

## 2013-12-18 LAB — CBC
HCT: 30 % — ABNORMAL LOW (ref 36.0–46.0)
Hemoglobin: 10.5 g/dL — ABNORMAL LOW (ref 12.0–15.0)
MCH: 33.4 pg (ref 26.0–34.0)
MCHC: 35 g/dL (ref 30.0–36.0)
MCV: 95.5 fL (ref 78.0–100.0)
PLATELETS: 149 10*3/uL — AB (ref 150–400)
RBC: 3.14 MIL/uL — ABNORMAL LOW (ref 3.87–5.11)
RDW: 12.4 % (ref 11.5–15.5)
WBC: 7.3 10*3/uL (ref 4.0–10.5)

## 2013-12-18 MED ORDER — KETOROLAC TROMETHAMINE 15 MG/ML IJ SOLN
15.0000 mg | Freq: Four times a day (QID) | INTRAMUSCULAR | Status: AC
  Administered 2013-12-18: 15 mg via INTRAVENOUS

## 2013-12-18 NOTE — Progress Notes (Signed)
Physical Therapy Treatment Patient Details Name: Doris Ford MRN: [id number] DOB: 02-02-1941 Today's Date: 01-06-14    History of Present Illness s/p R DA THA    PT Comments    Pt doing very well; will practice stairs again in am;   Follow Up Recommendations  Home health PT     Equipment Recommendations  Rolling walker with 5" wheels    Recommendations for Other Services       Precautions / Restrictions Precautions Precautions: None Restrictions Other Position/Activity Restrictions: WBAT    Mobility  Bed Mobility Overal bed mobility: Needs Assistance Bed Mobility: Supine to Sit     Supine to sit: Min assist;Min guard Sit to supine: Min assist   General bed mobility comments: cues for technique; assist with RLE  Transfers Overall transfer level: Needs assistance Equipment used: Rolling walker (2 wheeled) Transfers: Sit to/from Stand Sit to Stand: Supervision         General transfer comment: cues for hand placement  Ambulation/Gait Ambulation/Gait assistance: Supervision;Min guard Ambulation Distance (Feet): 155 Feet Assistive device: Rolling walker (2 wheeled) Gait Pattern/deviations: Step-to pattern;Step-through pattern;Antalgic     General Gait Details: cues for sequence and RW position form self; worked on step through gait; pt appears to be vaulting on RLE   Financial trader Rankin (Stroke Patients Only)       Balance                                    Cognition Arousal/Alertness: Awake/alert Behavior During Therapy: WFL for tasks assessed/performed Overall Cognitive Status: Within Functional Limits for tasks assessed                      Exercises Total Joint Exercises Hip ABduction/ADduction: Strengthening;Right;15 reps;Standing Long Arc Quad: AROM;Right;10 reps;Seated Knee Flexion: AROM;Strengthening;10 reps;Standing Marching in Standing: AROM;Strengthening;10  reps;Standing    General Comments        Pertinent Vitals/Pain C/o right hip soreness, ice after Pt session to hip    Home Living                      Prior Function            PT Goals (current goals can now be found in the care plan section) Acute Rehab PT Goals Patient Stated Goal: get back to doing things without pain PT Goal Formulation: With patient Time For Goal Achievement: 12/20/13 Potential to Achieve Goals: Good Progress towards PT goals: Progressing toward goals    Frequency  7X/week    PT Plan Current plan remains appropriate    Co-evaluation             End of Session Equipment Utilized During Treatment: Gait belt Activity Tolerance: Patient tolerated treatment well Patient left: in bed;with call bell/phone within reach;with family/visitor present     Time: 4680-3212 PT Time Calculation (min): 39 min  Charges:  $Gait Training: 8-22 mins $Therapeutic Exercise: 8-22 mins                    G Codes:      Neil Crouch January 06, 2014, 2:57 PM

## 2013-12-18 NOTE — Progress Notes (Signed)
Physical Therapy Treatment Patient Details Name: Doris Ford MRN: [id number] DOB: 1941/02/04 Today's Date: 12-27-13    History of Present Illness s/p R DA THA    PT Comments    Pt doing well today, feels much better  Follow Up Recommendations  Home health PT     Equipment Recommendations  Rolling walker with 5" wheels    Recommendations for Other Services       Precautions / Restrictions Precautions Precautions: None Restrictions Other Position/Activity Restrictions: WBAT    Mobility  Bed Mobility Overal bed mobility: Needs Assistance Bed Mobility: Supine to Sit     Supine to sit: Supervision Sit to supine: Supervision   General bed mobility comments: cues for technique  Transfers Overall transfer level: Needs assistance Equipment used: Rolling walker (2 wheeled) Transfers: Sit to/from Stand Sit to Stand: Min guard         General transfer comment: cues for hand placement  Ambulation/Gait Ambulation/Gait assistance: Min guard;Supervision Ambulation Distance (Feet): 125 Feet Assistive device: Rolling walker (2 wheeled) Gait Pattern/deviations: Step-to pattern;Step-through pattern     General Gait Details: cues for sequence and RW position form self; worked on step through gait; pt appears to be vaulting on RLE   Stairs Stairs: Yes Stairs assistance: Min assist Stair Management: Step to pattern;Backwards;With walker Number of Stairs: 2 General stair comments: verbal cues for stair technique  Wheelchair Mobility    Modified Rankin (Stroke Patients Only)       Balance                                    Cognition Arousal/Alertness: Awake/alert Behavior During Therapy: WFL for tasks assessed/performed Overall Cognitive Status: Within Functional Limits for tasks assessed                      Exercises Total Joint Exercises Ankle Circles/Pumps: AROM;Both;10 reps Quad Sets: AROM;Strengthening;Both;10 reps Heel  Slides: AROM;AAROM;Right;10 reps    General Comments        Pertinent Vitals/Pain "soreness" right hip; ice to hip after PT    Home Living Family/patient expects to be discharged to:: Private residence Living Arrangements: Spouse/significant other   Type of Home: House Home Access: Stairs to enter   Home Layout: One level Home Equipment: Environmental consultant - standard      Prior Function Level of Independence: Independent          PT Goals (current goals can now be found in the care plan section) Acute Rehab PT Goals Patient Stated Goal: get back to doing things without pain PT Goal Formulation: With patient Time For Goal Achievement: 12/20/13 Potential to Achieve Goals: Good Progress towards PT goals: Progressing toward goals    Frequency  7X/week    PT Plan Current plan remains appropriate    Co-evaluation             End of Session Equipment Utilized During Treatment: Gait belt Activity Tolerance: Patient tolerated treatment well Patient left: in chair;with call bell/phone within reach     Time: 0935-1000 PT Time Calculation (min): 25 min  Charges:  $Gait Training: 23-37 mins                    G Codes:      Neil Crouch 12-27-2013, 10:08 AM

## 2013-12-18 NOTE — Evaluation (Signed)
Occupational Therapy Evaluation Patient Details Name: Doris Ford MRN: [id number] DOB: 1940-11-19 Today's Date: 12/18/2013    History of Present Illness  RTHA- anterior   Clinical Impression   Pt presents to OT with decreased I with ADL activity s/p THA- DA. Pt will benefit from skilled OT to increase I with ADL activity and return to PLOF    Follow Up Recommendations  No OT follow up    Equipment Recommendations  None recommended by OT       Precautions / Restrictions Precautions Precautions: Anterior Hip      Mobility Bed Mobility Overal bed mobility: Needs Assistance Bed Mobility: Supine to Sit     Supine to sit: Supervision Sit to supine: Supervision   General bed mobility comments: cues for technique  Transfers Overall transfer level: Needs assistance Equipment used: Rolling walker (2 wheeled) Transfers: Sit to/from Stand Sit to Stand: Min guard         General transfer comment: cues for hand placement    Balance                                            ADL Overall ADL's : Needs assistance/impaired Eating/Feeding: Set up   Grooming: Set up;Wash/dry face;Wash/dry hands   Upper Body Bathing: Set up   Lower Body Bathing: Minimal assistance;Sit to/from stand   Upper Body Dressing : Set up;Sitting   Lower Body Dressing: Minimal assistance   Toilet Transfer: Minimal assistance;BSC   Toileting- Clothing Manipulation and Hygiene: Minimal assistance;Sit to/from stand       Functional mobility during ADLs: Minimal assistance General ADL Comments: verbal cues for safety             Hand Dominance        Communication Communication Communication: No difficulties   Cognition Arousal/Alertness: Awake/alert Behavior During Therapy: WFL for tasks assessed/performed Overall Cognitive Status: Within Functional Limits for tasks assessed                                Home Living Family/patient expects to  be discharged to:: Private residence Living Arrangements: Spouse/significant other   Type of Home: House Home Access: Stairs to enter Technical brewer of Steps: 2   Home Layout: One level     Bathroom Shower/Tub: Teacher, early years/pre: Standard     Home Equipment: Environmental consultant - standard          Prior Functioning/Environment Level of Independence: Independent             OT Diagnosis: Generalized weakness   OT Problem List: Decreased strength;Decreased activity tolerance   OT Treatment/Interventions: Self-care/ADL training;Patient/family education;DME and/or AE instruction    OT Goals(Current goals can be found in the care plan section) Acute Rehab OT Goals Patient Stated Goal: get back to doing things without pain OT Goal Formulation: With patient Time For Goal Achievement: 12/25/13 Potential to Achieve Goals: Good  OT Frequency: Min 2X/week    End of Session Nurse Communication: Mobility status  Activity Tolerance: Patient tolerated treatment well Patient left: in chair;with call bell/phone within reach   Time: 4235-3614 OT Time Calculation (min): 43 min Charges:  OT General Charges $OT Visit: 1 Procedure OT Evaluation $Initial OT Evaluation Tier I: 1 Procedure OT Treatments $Self Care/Home Management : 23-37 mins G-Codes:  Betsy Pries 12/18/2013, 9:21 AM

## 2013-12-18 NOTE — Plan of Care (Signed)
Problem: Phase III Progression Outcomes Goal: Anticoagulant follow-up in place Outcome: Not Applicable Date Met:  04/59/13 ASA for VTE, no f/u needed.

## 2013-12-18 NOTE — Progress Notes (Addendum)
Transferred to 1539. Report received from Warden Fillers, RN.  [employer]

## 2013-12-18 NOTE — Progress Notes (Signed)
CARE MANAGEMENT NOTE 12/18/2013  Patient:  Doris Ford, Doris Ford   Account Number:  [id number]  Date Initiated:  12/17/2013  Documentation initiated by:  [facility]  Subjective/Objective Assessment:   RIGHT TOTAL HIP ARTHROPLASTY ANTERIOR APPROACH     Action/Plan:   waiting PT recommendations   Anticipated DC Date:  12/19/2013   Anticipated DC Plan:  [place]  CM consult      [facility] Choice  HOME HEALTH   Choice offered to / List presented to:  C-1 Patient   DME arranged  Vassie Moselle      DME agency  [place] arranged  [place].   Status of service:  Completed, signed off Medicare Important Message given?   (If response is "NO", the following Medicare IM given date fields will be blank) Date Medicare IM given:   Date Additional Medicare IM given:    Discharge Disposition:  [place]  Per UR Regulation:    If discussed at Long Length of Stay Meetings, dates discussed:    Comments:  12/18/2013 1845 NCM spoke to pt and offered choice for [facility]. Pt requested [facility] for HH. Requested RW for home. Referral faxed to [facility]. Jonnie Finner RN CCM Case Mgmt phone [phone]

## 2013-12-18 NOTE — Progress Notes (Signed)
Subjective: 2 Days Post-Op Procedure(s) (LRB): RIGHT TOTAL HIP ARTHROPLASTY ANTERIOR APPROACH (Right) Patient reports pain as mild.    Objective: Vital signs in last 24 hours: Temp:  [97.8 F (36.6 C)-99 F (37.2 C)] 98.4 F (36.9 C) (05/24 0506) Pulse Rate:  [72-78] 73 (05/24 0506) Resp:  [18-20] 20 (05/24 0506) BP: (97-125)/(59-74) 125/74 mmHg (05/24 0506) SpO2:  [94 %-96 %] 95 % (05/24 0506)  Intake/Output from previous day: 05/23 0701 - 05/24 0700 In: 1478.8 [P.O.:720; I.V.:758.8] Out: 1850 [Urine:1750; Emesis/NG output:100] Intake/Output this shift:     Recent Labs  12/17/13 0540 12/18/13 0450  HGB 11.1* 10.5*    Recent Labs  12/17/13 0540 12/18/13 0450  WBC 9.2 7.3  RBC 3.35* 3.14*  HCT 31.9* 30.0*  PLT 179 149*    Recent Labs  12/17/13 0540  NA 135*  K 3.3*  CL 95*  CO2 30  BUN 12  CREATININE 0.67  GLUCOSE 149*  CALCIUM 8.6   No results found for this basename: LABPT, INR,  in the last 72 hours  Neurologically intact ABD soft Neurovascular intact  Assessment/Plan: 2 Days Post-Op Procedure(s) (LRB): RIGHT TOTAL HIP ARTHROPLASTY ANTERIOR APPROACH (Right) Up with therapy Plan for discharge tomorrow Comfortable without nausea today-expect D/C tomorrow Doris Ford 12/18/2013, 9:31 AM

## 2013-12-19 LAB — CBC
HCT: 30.6 % — ABNORMAL LOW (ref 36.0–46.0)
Hemoglobin: 10.7 g/dL — ABNORMAL LOW (ref 12.0–15.0)
MCH: 33.2 pg (ref 26.0–34.0)
MCHC: 35 g/dL (ref 30.0–36.0)
MCV: 95 fL (ref 78.0–100.0)
PLATELETS: 164 10*3/uL (ref 150–400)
RBC: 3.22 MIL/uL — AB (ref 3.87–5.11)
RDW: 12.4 % (ref 11.5–15.5)
WBC: 6.7 10*3/uL (ref 4.0–10.5)

## 2013-12-19 MED ORDER — METHOCARBAMOL 500 MG PO TABS: 500.0000 mg | ORAL_TABLET | Freq: Four times a day (QID) | ORAL | Status: DC | PRN

## 2013-12-19 MED ORDER — TRAMADOL HCL 50 MG PO TABS: 100.0000 mg | ORAL_TABLET | Freq: Four times a day (QID) | ORAL | Status: DC | PRN

## 2013-12-19 MED ORDER — ASPIRIN 325 MG PO TBEC: 325.0000 mg | DELAYED_RELEASE_TABLET | Freq: Two times a day (BID) | ORAL | Status: DC

## 2013-12-19 NOTE — Discharge Summary (Signed)
Patient ID: Doris Ford MRN: [id number] DOB/AGE: 1941-06-06 73 y.o.  Admit date: 12/16/2013 Discharge date: 12/19/2013  Admission Diagnoses:  Principal Problem:   Arthritis of right hip Active Problems:   Status post THR (total hip replacement)   Discharge Diagnoses:  Same  Past Medical History  Diagnosis Date  . Interstitial cystitis   . Osteopenia     no change  . Cancer 12/07    right breast/tx with lumpectomy and tamoxifen  . Hypertension     under control  . Arthritis     Surgeries: Procedure(s): RIGHT TOTAL HIP ARTHROPLASTY ANTERIOR APPROACH on 12/16/2013   Consultants:    Discharged Condition: Improved  Hospital Course: Doris Ford is an 73 y.o. female who was admitted 12/16/2013 for operative treatment ofArthritis of right hip. Patient has severe unremitting pain that affects sleep, daily activities, and work/hobbies. After pre-op clearance the patient was taken to the operating room on 12/16/2013 and underwent  Procedure(s): RIGHT TOTAL HIP ARTHROPLASTY ANTERIOR APPROACH.    Patient was given perioperative antibiotics: Anti-infectives   Start     Dose/Rate Route Frequency Ordered Stop   12/16/13 1800  ceFAZolin (ANCEF) IVPB 1 g/50 mL premix     1 g 100 mL/hr over 30 Minutes Intravenous Every 6 hours 12/16/13 1548 12/17/13 0128   12/16/13 0925  ceFAZolin (ANCEF) IVPB 2 g/50 mL premix     2 g 100 mL/hr over 30 Minutes Intravenous On call to O.R. 12/16/13 8563 12/16/13 1202       Patient was given sequential compression devices, early ambulation, and chemoprophylaxis to prevent DVT.  Patient benefited maximally from hospital stay and there were no complications.    Recent vital signs: Patient Vitals for the past 24 hrs:  BP Temp Temp src Pulse Resp SpO2  12/19/13 0620 147/76 mmHg 98.4 F (36.9 C) Oral 82 20 96 %  12/18/13 2228 131/73 mmHg - Oral 80 18 96 %  12/18/13 1600 - - - - 20 95 %  12/18/13 1200 - - - - 20 95 %     Recent laboratory  studies:  Recent Labs  12/17/13 0540 12/18/13 0450 12/19/13 0414  WBC 9.2 7.3 6.7  HGB 11.1* 10.5* 10.7*  HCT 31.9* 30.0* 30.6*  PLT 179 149* 164  NA 135*  --   --   K 3.3*  --   --   CL 95*  --   --   CO2 30  --   --   BUN 12  --   --   CREATININE 0.67  --   --   GLUCOSE 149*  --   --   CALCIUM 8.6  --   --      Discharge Medications:     Medication List    STOP taking these medications       ALEVE PO     indomethacin 25 MG capsule  Commonly known as:  INDOCIN      TAKE these medications       aspirin 325 MG EC tablet  Take 1 tablet (325 mg total) by mouth 2 (two) times daily after a meal.     CALTRATE 600 PLUS-VIT D PO  Take 600 mg by mouth daily.     chlorthalidone 25 MG tablet  Commonly known as:  HYGROTON  Take 25 mg by mouth every morning.     Estradiol 10 MCG Tabs vaginal tablet  Place 10 mcg vaginally every 14 (fourteen) days.  hydroxypropyl methylcellulose 2.5 % ophthalmic solution  Commonly known as:  ISOPTO TEARS  Place 1 drop into both eyes 3 (three) times daily as needed for dry eyes.     methocarbamol 500 MG tablet  Commonly known as:  ROBAXIN  Take 1 tablet (500 mg total) by mouth every 6 (six) hours as needed for muscle spasms.     OCUVITE PO  Take 1 tablet by mouth daily.     OMEGA 3 PO  Take 1 g by mouth daily.     OVER THE COUNTER MEDICATION  Take 1 tablet by mouth 2 (two) times daily. Cysta Q supplyment     quinapril 20 MG tablet  Commonly known as:  ACCUPRIL  Take 20 mg by mouth 2 (two) times daily.     traMADol 50 MG tablet  Commonly known as:  ULTRAM  Take 2 tablets (100 mg total) by mouth every 6 (six) hours as needed for moderate pain.     VITAMIN C PO  Take 1,000 mg by mouth daily.     VITAMIN D PO  Take 1,000 Units by mouth daily.        Diagnostic Studies: Dg Chest 2 View  12/12/2013   CLINICAL DATA:  Preop for right hip pain  EXAM: CHEST  2 VIEW  COMPARISON:  None.  FINDINGS: Cardiomediastinal  silhouette is unremarkable. Mild hyperinflation. Mild thoracic dextroscoliosis. Levoscoliosis of lumbar spine. Question old fracture deformity of the right eighth rib. No acute infiltrate or pleural effusion. No pulmonary edema. Osteopenia. Degenerative changes thoracolumbar spine.  IMPRESSION: Mild hyperinflation. S-shaped thoracolumbar scoliosis. No acute infiltrate or pulmonary edema.   Electronically Signed   By: Lahoma Crocker M.D.   On: 12/12/2013 14:19   Dg Hip Complete Right  12/16/2013   CLINICAL DATA:  Right hip arthritis, right hip replacement  EXAM: RIGHT HIP - COMPLETE 2+ VIEW  COMPARISON:  None.  FINDINGS: Two spot films were obtained intraoperatively and reveal findings consistent with a right hip replacement. The femoral component is well seated in the acetabular component. No acute abnormality is seen. 35 seconds of fluoroscopy was utilized.   Electronically Signed   By: Inez Catalina M.D.   On: 12/16/2013 14:14   Dg Pelvis Portable  12/16/2013   CLINICAL DATA:  Right hip replacement.  EXAM: PORTABLE PELVIS 1-2 VIEWS  COMPARISON:  DG C-ARM 61-120 MIN-NO REPORT dated 12/16/2013; CT HIP*R* W/O CM dated 10/14/2013  FINDINGS: Patient status post right hip replacement. Good anatomic alignment noted. Subcutaneous air noted at the surgical site. Peripheral vascular calcification noted. Calcifications pelvis consistent with phleboliths.  IMPRESSION: Patient status post total right hip replacement with good anatomic alignment on AP view.   Electronically Signed   By: Marcello Moores  Register   On: 12/16/2013 16:17   Dg Hip Portable 1 View Right  12/16/2013   CLINICAL DATA:  Right hip replacement.  EXAM: PORTABLE RIGHT HIP - 1 VIEW  COMPARISON:  DG HIP COMPLETE*R* dated 12/16/2013  FINDINGS: Cross-table lateral view of the right hip reveals good anatomic alignment status post total right hip replacement.  IMPRESSION: Total right hip replacement with good anatomic alignment on cross-table lateral view.    Electronically Signed   By: Marcello Moores  Register   On: 12/16/2013 16:18   Dg C-arm 61-120 Min-no Report  12/16/2013   CLINICAL DATA: INTRAOP   C-ARM 61-120 MINUTES  Fluoroscopy was utilized by the requesting physician.  No radiographic  interpretation.     Disposition: 01-Home or Self  Care      Discharge Instructions   Call MD / Call 911    Complete by:  As directed   If you experience chest pain or shortness of breath, CALL 911 and be transported to the hospital emergency room.  If you develope a fever above 101 F, pus (white drainage) or increased drainage or redness at the wound, or calf pain, call your surgeon's office.     Constipation Prevention    Complete by:  As directed   Drink plenty of fluids.  Prune juice may be helpful.  You may use a stool softener, such as Colace (over the counter) 100 mg twice a day.  Use MiraLax (over the counter) for constipation as needed.     Diet - low sodium heart healthy    Complete by:  As directed      Discharge instructions    Complete by:  As directed   Increase your activities as comfort allows. You can get your current dressing wet in the shower. You can remove your current dressing 5/29 and then start getting your actual incision wet in the shower. New dry dressing daily starting 5/29. Do take an over the counter stool softener daily     Discharge patient    Complete by:  As directed      Increase activity slowly as tolerated    Complete by:  As directed            Follow-up Information   Follow up with [place])    Contact information:   [place] [address] [phone]       Follow up with Mcarthur Rossetti, MD In 2 weeks.   Specialty:  Orthopedic Surgery   Contact information:   [address] [place] [address] [phone]        Signed: Mcarthur Rossetti 12/19/2013, 9:09 AM

## 2013-12-19 NOTE — Progress Notes (Signed)
Physical Therapy Treatment Patient Details Name: Doris Ford MRN: [id number] DOB: 03-24-1941 Today's Date: 12-31-2013    History of Present Illness s/p R DA THA    PT Comments    Doing great; will come back and do steps  Follow Up Recommendations  Home health PT     Equipment Recommendations  Rolling walker with 5" wheels    Recommendations for Other Services       Precautions / Restrictions Precautions Precautions: None Restrictions Weight Bearing Restrictions: No Other Position/Activity Restrictions: WBAT    Mobility  Bed Mobility Overal bed mobility: Needs Assistance Bed Mobility: Supine to Sit     Supine to sit: Supervision;Modified independent (Device/Increase time) Sit to supine: Supervision;Modified independent (Device/Increase time)      Transfers Overall transfer level: Needs assistance Equipment used: Rolling walker (2 wheeled) Transfers: Sit to/from Stand Sit to Stand: Supervision;Modified independent (Device/Increase time)         General transfer comment: cues for hand placement  Ambulation/Gait Ambulation/Gait assistance: Supervision Ambulation Distance (Feet): 160 Feet Assistive device: Rolling walker (2 wheeled) Gait Pattern/deviations: Step-to pattern;Step-through pattern     General Gait Details: cues for sequence   Stairs            Wheelchair Mobility    Modified Rankin (Stroke Patients Only)       Balance                                    Cognition Arousal/Alertness: Awake/alert Behavior During Therapy: WFL for tasks assessed/performed Overall Cognitive Status: Within Functional Limits for tasks assessed                      Exercises Total Joint Exercises Ankle Circles/Pumps: AROM;Both;10 reps Quad Sets: AROM;Strengthening;Both;10 reps Heel Slides: AROM;AAROM;Right;10 reps Long Arc Quad: AROM;Right;10 reps;Seated Knee Flexion: AROM;Strengthening;10 reps;Standing Marching in  Standing: AROM;Strengthening;10 reps;Standing    General Comments        Pertinent Vitals/Pain Pain controlled    Home Living                      Prior Function            PT Goals (current goals can now be found in the care plan section) Acute Rehab PT Goals Patient Stated Goal: get back to doing things without pain PT Goal Formulation: With patient Time For Goal Achievement: 12/20/13 Potential to Achieve Goals: Good Progress towards PT goals: Progressing toward goals    Frequency  7X/week    PT Plan Current plan remains appropriate    Co-evaluation             End of Session Equipment Utilized During Treatment: Gait belt Activity Tolerance: Patient tolerated treatment well Patient left: in bed;with call bell/phone within reach;with family/visitor present     Time: 9798-9211 PT Time Calculation (min): 39 min  Charges:  $Gait Training: 23-37 mins $Therapeutic Exercise: 8-22 mins                    G Codes:      Neil Crouch 2013/12/31, 9:55 AM

## 2013-12-19 NOTE — Progress Notes (Signed)
Occupational Therapy Treatment Patient Details Name: Doris Ford MRN: [id number] DOB: [birth year] Today's Date: 12/19/2013    History of present illness s/p R DA THA   OT comments  Doing great!           Precautions / Restrictions Precautions Precautions: None Restrictions Weight Bearing Restrictions: No Other Position/Activity Restrictions: WBAT       Mobility Bed Mobility Overal bed mobility: Needs Assistance Bed Mobility: Supine to Sit     Supine to sit: Supervision;Modified independent (Device/Increase time) Sit to supine: Supervision;Modified independent (Device/Increase time)      Transfers Overall transfer level: Needs assistance Equipment used: Rolling walker (2 wheeled) Transfers: Sit to/from Stand Sit to Stand: Supervision         General transfer comment: cues for hand placement    Balance                                   ADL Overall ADL's : Needs assistance/impaired     Grooming: Standing;Supervision/safety   Upper Body Bathing: Independent;Sitting   Lower Body Bathing: Set up;Sit to/from stand   Upper Body Dressing : Independent;Sitting   Lower Body Dressing: Supervision/safety;Sit to/from stand   Toilet Transfer: Supervision/safety;RW   Toileting- Water quality scientist and Hygiene: Supervision/safety;Sit to/from stand   Tub/ Banker: Minimal assistance   Functional mobility during ADLs: Supervision/safety                  Cognition   Behavior During Therapy: [facility] for tasks assessed/performed Overall Cognitive Status: Within Functional Limits for tasks assessed                       Extremity/Trunk Assessment               Exercises Total Joint Exercises Ankle Circles/Pumps: AROM;Both;10 reps Quad Sets: AROM;Strengthening;Both;10 reps Heel Slides: AROM;AAROM;Right;10 reps Long Arc Quad: AROM;Right;10 reps;Seated Knee Flexion: AROM;Strengthening;10 reps;Standing Marching in  Standing: AROM;Strengthening;10 reps;Standing   Shoulder Instructions         Progress Toward Goals  OT Goals(current goals can now be found in the care plan section)  Progress towards OT goals: Progressing toward goals  Acute Rehab OT Goals Patient Stated Goal: get back to doing things without pain  Plan Discharge plan remains appropriate          Activity Tolerance Patient tolerated treatment well   Patient Left in chair;with call bell/phone within reach;with family/visitor present   Nurse Communication Mobility status        Time: 1117-1140 OT Time Calculation (min): 23 min  Charges: OT General Charges $OT Visit: 1 Procedure OT Treatments $Self Care/Home Management : 23-37 mins  Betsy Pries 12/19/2013, 11:52 AM

## 2013-12-19 NOTE — Progress Notes (Signed)
Patient ID: Doris Ford, female   DOB: June 25, 1941, 73 y.o.   MRN: [id number] Looks good and feels better.  Good mobility with therapy.  Acute blood loss anemia, but tolerating well.  Can discharge to home today.

## 2013-12-19 NOTE — Progress Notes (Signed)
Discharge summary sent to payer through [facility]  

## 2013-12-19 NOTE — Progress Notes (Signed)
Physical Therapy Treatment Patient Details Name: TANGY DROZDOWSKI MRN: [id number] DOB: May 25, 1941 Today's Date: 01-01-2014    History of Present Illness s/p R DA THA    PT Comments    Pt doing great;  Follow Up Recommendations  Home health PT     Equipment Recommendations  Rolling walker with 5" wheels    Recommendations for Other Services       Precautions / Restrictions Precautions Precautions: None Restrictions Other Position/Activity Restrictions: WBAT    Mobility  Bed Mobility                  Transfers   Equipment used: Rolling walker (2 wheeled) Transfers: Sit to/from Stand Sit to Stand: Modified independent (Device/Increase time)         General transfer comment: cues for hand placement  Ambulation/Gait Ambulation/Gait assistance: Supervision;Modified independent (Device/Increase time) Ambulation Distance (Feet): 160 Feet Assistive device: Rolling walker (2 wheeled)           Stairs Stairs: Yes Stairs assistance: Min assist Stair Management: Step to pattern;Backwards;With walker Number of Stairs: 2 General stair comments: verbal cues for stair technique; husband assisted  Wheelchair Mobility    Modified Rankin (Stroke Patients Only)       Balance                                    Cognition Arousal/Alertness: Awake/alert Behavior During Therapy: WFL for tasks assessed/performed Overall Cognitive Status: Within Functional Limits for tasks assessed                      Exercises      General Comments        Pertinent Vitals/Pain     Home Living                      Prior Function            PT Goals (current goals can now be found in the care plan section) Acute Rehab PT Goals PT Goal Formulation: With patient Time For Goal Achievement: 12/20/13 Potential to Achieve Goals: Good Progress towards PT goals: Progressing toward goals    Frequency  7X/week    PT Plan Current  plan remains appropriate    Co-evaluation             End of Session Equipment Utilized During Treatment: Gait belt Activity Tolerance: Patient tolerated treatment well Patient left: in chair;with call bell/phone within reach;with nursing/sitter in room;with family/visitor present     Time: 1141-1151 PT Time Calculation (min): 10 min  Charges:  $Gait Training: 8-22 mins                    G Codes:      Neil Crouch 2014-01-01, 4:58 PM

## 2013-12-20 ENCOUNTER — Encounter (HOSPITAL_COMMUNITY): Payer: Self-pay | Admitting: Orthopaedic Surgery

## 2014-01-10 ENCOUNTER — Ambulatory Visit (INDEPENDENT_AMBULATORY_CARE_PROVIDER_SITE_OTHER): Payer: Medicare HMO | Admitting: Obstetrics & Gynecology

## 2014-01-10 ENCOUNTER — Encounter: Payer: Self-pay | Admitting: Obstetrics & Gynecology

## 2014-01-10 VITALS — BP 128/72 | HR 60 | Resp 16 | Ht 64.25 in | Wt 114.6 lb

## 2014-01-10 DIAGNOSIS — Z01419 Encounter for gynecological examination (general) (routine) without abnormal findings: Secondary | ICD-10-CM

## 2014-01-10 NOTE — Progress Notes (Signed)
73 y.o. G2P2 MarriedCaucasianF here for annual exam.  Had right hip replacement due to long-term arthritis, labrium tears, bones spurs.  Doing really well.  This was done 5/22.  Had been to physical therapy and pain management before that without much success.  Taking Tylenol occasionally.  Off all the prescription medications she was prescribed post operatively.    Off Vagifem due to cost.  Really doing Ok.  Off for six months.    Sees Dr. Osborne Casco.  Saw him in February.  Doing well.    No LMP recorded. Patient has had a hysterectomy.          Sexually active: no  The current method of family planning is status post hysterectomy.    Exercising: yes  walking Smoker:  no  Health Maintenance: Pap:  08/07/09 normal History of abnormal Pap:  no MMG:  08/09/13-normal Colonoscopy:  5/12-repeat in 10 years BMD:   2/15-off Fosamax TDaP:  PCP Screening Labs: PCP, Hb today: PCP, Urine today: PCP   reports that she has never smoked. She has never used smokeless tobacco. She reports that she does not drink alcohol or use illicit drugs.  Past Medical History  Diagnosis Date  . Interstitial cystitis   . Osteopenia     no change  . Hypertension     under control  . Arthritis   . Cancer 12/07    right breast/tx with lumpectomy and tamoxifen    Past Surgical History  Procedure Laterality Date  . Total vaginal hysterectomy  age 68  . Bilateral salpingoophorectomy  age 86s  . Knee surgery Bilateral as teen  . Bunionectomy with hammertoe reconstruction Bilateral ~2000  . Tonsillectomy and adenoidectomy  age 21  . Breast surgery Right     lumpectomy-multiple times  . Cataract extraction Bilateral 2007  . Total hip arthroplasty Right 12/16/2013    Procedure: RIGHT TOTAL HIP ARTHROPLASTY ANTERIOR APPROACH;  Surgeon: Mcarthur Rossetti, MD;  Location: [facility];  Service: Orthopedics;  Laterality: Right;    Current Outpatient Prescriptions  Medication Sig Dispense Refill  . Ascorbic Acid  (VITAMIN C PO) Take 1,000 mg by mouth daily.       . Calcium-Vitamin D (CALTRATE 600 PLUS-VIT D PO) Take 600 mg by mouth daily.      . chlorthalidone (HYGROTON) 25 MG tablet Take 25 mg by mouth every morning.       . Cholecalciferol (VITAMIN D PO) Take 1,000 Units by mouth daily.       . hydroxypropyl methylcellulose (ISOPTO TEARS) 2.5 % ophthalmic solution Place 1 drop into both eyes 3 (three) times daily as needed for dry eyes.      . Multiple Vitamins-Minerals (OCUVITE PO) Take 1 tablet by mouth daily.       . Omega-3 Fatty Acids (OMEGA 3 PO) Take 1 g by mouth daily.       [name] OVER THE COUNTER MEDICATION Take 1 tablet by mouth 2 (two) times daily. Cysta Q supplyment      . quinapril (ACCUPRIL) 20 MG tablet Take 20 mg by mouth 2 (two) times daily.      [name] aspirin EC 325 MG EC tablet Take 1 tablet (325 mg total) by mouth 2 (two) times daily after a meal.  30 tablet  0  . Estradiol 10 MCG TABS vaginal tablet Place 10 mcg vaginally every 14 (fourteen) days.       . indomethacin (INDOCIN) 25 MG capsule        No  current facility-administered medications for this visit.    Family History  Problem Relation Age of Onset  . Cancer Mother     breast/mastectomy  . Hypertension Father   . Heart disease Mother     poor circulation  . Deep vein thrombosis Mother   . Osteoporosis Mother     ROS:  Pertinent items are noted in HPI.  Otherwise, a comprehensive ROS was negative.  Exam:   BP 128/72  Pulse 60  Resp 16  Ht 5' 4.25" (1.632 m)  Wt 114 lb 9.6 oz (51.982 kg)  BMI 19.52 kg/m2  Weight change: -2#   Height: 5' 4.25" (163.2 cm)  Ht Readings from Last 3 Encounters:  01/10/14 5' 4.25" (1.632 m)  12/16/13 5\' 4"  (1.626 m)  12/16/13 5\' 4"  (1.626 m)    General appearance: alert, cooperative and appears stated age Head: Normocephalic, without obvious abnormality, atraumatic Neck: no adenopathy, supple, symmetrical, trachea midline and thyroid normal to inspection and palpation Lungs: clear  to auscultation bilaterally Breasts: normal appearance, no masses or tenderness Heart: regular rate and rhythm Abdomen: soft, non-tender; bowel sounds normal; no masses,  no organomegaly Extremities: extremities normal, atraumatic, no cyanosis or edema Skin: Skin color, texture, turgor normal. No rashes or lesions Lymph nodes: Cervical, supraclavicular, and axillary nodes normal. No abnormal inguinal nodes palpated Neurologic: Grossly normal   Pelvic: External genitalia:  no lesions              Urethra:  normal appearing urethra with no masses, tenderness or lesions              Bartholins and Skenes: normal                 Vagina: normal appearing vagina with normal color and discharge, no lesions              Cervix: absent              Pap taken: no Bimanual Exam:  Uterus:  uterus absent              Adnexa: normal adnexa and no mass, fullness, tenderness               Rectovaginal: Confirms               Anus:  normal sphincter tone, no lesions  A:  Well Woman with normal exam  H/O TVH/BSO  Vaginal atrophy  H/O R breast cancer s/p lumpectomy and tamoxifen 12/07 S/p right hip replacement 12/16/13 with Dr. Corky Sox [facility] Osteopenia Hypertension, mild Family hx of breast cancer in her mother   P: Mammogram yearly.  Grade 3 dense breasts.   Off Vagifem due to cost.   Sees PCP yearly  return annually or prn     An After Visit Summary was printed and given to the patient.

## 2014-01-10 NOTE — Patient Instructions (Signed)

## 2014-05-29 ENCOUNTER — Encounter: Payer: Self-pay | Admitting: Obstetrics & Gynecology

## 2014-09-29 ENCOUNTER — Other Ambulatory Visit (HOSPITAL_COMMUNITY): Payer: Self-pay | Admitting: *Deleted

## 2014-09-29 DIAGNOSIS — Z1231 Encounter for screening mammogram for malignant neoplasm of breast: Secondary | ICD-10-CM

## 2014-10-04 ENCOUNTER — Ambulatory Visit (HOSPITAL_COMMUNITY)
Admission: RE | Admit: 2014-10-04 | Discharge: 2014-10-04 | Disposition: A | Discharge status: Home / Self Care | Payer: Medicare HMO | Source: Ambulatory Visit | Attending: Internal Medicine | Admitting: Internal Medicine

## 2014-10-04 DIAGNOSIS — Z1231 Encounter for screening mammogram for malignant neoplasm of breast: Secondary | ICD-10-CM | POA: Diagnosis present

## 2015-01-26 ENCOUNTER — Ambulatory Visit (INDEPENDENT_AMBULATORY_CARE_PROVIDER_SITE_OTHER): Payer: Medicare HMO | Admitting: Obstetrics & Gynecology

## 2015-01-26 ENCOUNTER — Encounter: Payer: Self-pay | Admitting: Obstetrics & Gynecology

## 2015-01-26 VITALS — BP 112/70 | HR 60 | Resp 16 | Ht 63.5 in | Wt 120.0 lb

## 2015-01-26 DIAGNOSIS — Z01419 Encounter for gynecological examination (general) (routine) without abnormal findings: Secondary | ICD-10-CM | POA: Diagnosis not present

## 2015-01-26 NOTE — Progress Notes (Signed)
74 y.o. G2P2 MarriedCaucasianF here for annual exam.  Reports she has done well since her hip surgery.  She doesn't feel like she has the same energy but she does not have any pain and she is so grateful for that.  Biggest issue is stairs--still climbing slowly but does think it is improving.    Some stressors this year with her husband.  Has heart cath coming up before having shoulder surgery.  Pt is trying to help him from getting down.    PCP:  Dr. Osborne Casco.  Last appt was in March.  Lab work was normal then.    No LMP recorded. Patient has had a hysterectomy.          Sexually active: No.  The current method of family planning is status post hysterectomy.    Exercising: No.  not regularly Smoker:  no  Health Maintenance: Pap:  08/07/09 WNL History of abnormal Pap:  no MMG: 10/05/14 BIRADS1:Neg Colonoscopy:  2012 - repeat 10 years  BMD:   2/15 TDaP:  PCP Screening Labs: PCP, Hb today: PCP, Urine today: PCP   reports that she has never smoked. She has never used smokeless tobacco. She reports that she does not drink alcohol or use illicit drugs.  Past Medical History  Diagnosis Date  . Interstitial cystitis   . Osteopenia     no change  . Hypertension     under control  . Arthritis   . Cancer 12/07    right breast/tx with lumpectomy and tamoxifen    Past Surgical History  Procedure Laterality Date  . Total vaginal hysterectomy  age 70  . Bilateral salpingoophorectomy  age 83s  . Knee surgery Bilateral as teen  . Bunionectomy with hammertoe reconstruction Bilateral ~2000  . Tonsillectomy and adenoidectomy  age 38  . Breast surgery Right     lumpectomy-multiple times  . Cataract extraction Bilateral 2007  . Total hip arthroplasty Right 12/16/2013    Procedure: RIGHT TOTAL HIP ARTHROPLASTY ANTERIOR APPROACH;  Surgeon: Mcarthur Rossetti, MD;  Location: [facility];  Service: Orthopedics;  Laterality: Right;    Current Outpatient Prescriptions  Medication Sig Dispense Refill   . Ascorbic Acid (VITAMIN C PO) Take 1,000 mg by mouth daily.     [name] aspirin EC 325 MG EC tablet Take 1 tablet (325 mg total) by mouth 2 (two) times daily after a meal. 30 tablet 0  . Calcium-Vitamin D (CALTRATE 600 PLUS-VIT D PO) Take 600 mg by mouth daily.    . chlorthalidone (HYGROTON) 25 MG tablet Take 25 mg by mouth every morning.     . Cholecalciferol (VITAMIN D PO) Take 1,000 Units by mouth daily.     . Estradiol 10 MCG TABS vaginal tablet Place 10 mcg vaginally every 14 (fourteen) days.     . hydroxypropyl methylcellulose (ISOPTO TEARS) 2.5 % ophthalmic solution Place 1 drop into both eyes 3 (three) times daily as needed for dry eyes.    . indomethacin (INDOCIN) 25 MG capsule     . Multiple Vitamins-Minerals (OCUVITE PO) Take 1 tablet by mouth daily.     . Omega-3 Fatty Acids (OMEGA 3 PO) Take 1 g by mouth daily.     [name] OVER THE COUNTER MEDICATION Take 1 tablet by mouth 2 (two) times daily. Cysta Q supplyment    . quinapril (ACCUPRIL) 20 MG tablet Take 20 mg by mouth 2 (two) times daily.     No current facility-administered medications for this visit.  Family History  Problem Relation Age of Onset  . Cancer Mother     breast/mastectomy  . Hypertension Father   . Heart disease Mother     poor circulation  . Deep vein thrombosis Mother   . Osteoporosis Mother     ROS:  Pertinent items are noted in HPI.  Otherwise, a comprehensive ROS was negative.  Exam:   General appearance: alert, cooperative and appears stated age Head: Normocephalic, without obvious abnormality, atraumatic Neck: no adenopathy, supple, symmetrical, trachea midline and thyroid normal to inspection and palpation Lungs: clear to auscultation bilaterally Breasts: normal appearance, no masses or tenderness Heart: regular rate and rhythm Abdomen: soft, non-tender; bowel sounds normal; no masses,  no organomegaly Extremities: extremities normal, atraumatic, no cyanosis or edema Skin: Skin color, texture,  turgor normal. No rashes or lesions Lymph nodes: Cervical, supraclavicular, and axillary nodes normal. No abnormal inguinal nodes palpated Neurologic: Grossly normal   Pelvic: External genitalia:  no lesions              Urethra:  normal appearing urethra with no masses, tenderness or lesions              Bartholins and Skenes: normal                 Vagina: normal appearing vagina with normal color and discharge, no lesions              Cervix: absent              Pap taken: No. Bimanual Exam:  Uterus:  uterus absent              Adnexa: normal adnexa and no mass, fullness, tenderness               Rectovaginal: Confirms               Anus:  normal sphincter tone, no lesions  Chaperone was present for exam.  A:  Well Woman with normal exam  H/O TVH/BSO  Vaginal atrophy  H/O R breast cancer s/p lumpectomy and tamoxifen 12/07 S/p right hip replacement 12/16/13 I.C. Osteopenia Hypertension, mild Family hx of breast cancer in her mother  P: Mammogram yearly. Grade 3 dense breasts.    Sees PCP yearly  return annually or prn

## 2015-05-07 DIAGNOSIS — Z23 Encounter for immunization: Secondary | ICD-10-CM | POA: Diagnosis not present

## 2015-05-28 DIAGNOSIS — R69 Illness, unspecified: Secondary | ICD-10-CM | POA: Diagnosis not present

## 2015-05-31 DIAGNOSIS — R69 Illness, unspecified: Secondary | ICD-10-CM | POA: Diagnosis not present

## 2015-06-07 DIAGNOSIS — L218 Other seborrheic dermatitis: Secondary | ICD-10-CM | POA: Diagnosis not present

## 2015-10-17 ENCOUNTER — Other Ambulatory Visit (HOSPITAL_COMMUNITY): Payer: Self-pay | Admitting: Internal Medicine

## 2015-10-17 DIAGNOSIS — Z1231 Encounter for screening mammogram for malignant neoplasm of breast: Secondary | ICD-10-CM

## 2015-10-19 ENCOUNTER — Ambulatory Visit (HOSPITAL_COMMUNITY)
Admission: RE | Admit: 2015-10-19 | Discharge: 2015-10-19 | Disposition: A | Discharge status: Home / Self Care | Payer: Medicare HMO | Source: Ambulatory Visit | Attending: Internal Medicine | Admitting: Internal Medicine

## 2015-10-19 DIAGNOSIS — Z1231 Encounter for screening mammogram for malignant neoplasm of breast: Secondary | ICD-10-CM | POA: Diagnosis not present

## 2015-10-24 DIAGNOSIS — M859 Disorder of bone density and structure, unspecified: Secondary | ICD-10-CM | POA: Diagnosis not present

## 2015-10-24 DIAGNOSIS — E784 Other hyperlipidemia: Secondary | ICD-10-CM | POA: Diagnosis not present

## 2015-10-24 DIAGNOSIS — I10 Essential (primary) hypertension: Secondary | ICD-10-CM | POA: Diagnosis not present

## 2015-10-24 DIAGNOSIS — R8299 Other abnormal findings in urine: Secondary | ICD-10-CM | POA: Diagnosis not present

## 2015-10-31 DIAGNOSIS — N3010 Interstitial cystitis (chronic) without hematuria: Secondary | ICD-10-CM | POA: Diagnosis not present

## 2015-10-31 DIAGNOSIS — E7800 Pure hypercholesterolemia, unspecified: Secondary | ICD-10-CM | POA: Diagnosis not present

## 2015-10-31 DIAGNOSIS — I10 Essential (primary) hypertension: Secondary | ICD-10-CM | POA: Diagnosis not present

## 2015-10-31 DIAGNOSIS — Z1389 Encounter for screening for other disorder: Secondary | ICD-10-CM | POA: Diagnosis not present

## 2015-10-31 DIAGNOSIS — M1990 Unspecified osteoarthritis, unspecified site: Secondary | ICD-10-CM | POA: Diagnosis not present

## 2015-10-31 DIAGNOSIS — C50919 Malignant neoplasm of unspecified site of unspecified female breast: Secondary | ICD-10-CM | POA: Diagnosis not present

## 2015-10-31 DIAGNOSIS — M859 Disorder of bone density and structure, unspecified: Secondary | ICD-10-CM | POA: Diagnosis not present

## 2015-10-31 DIAGNOSIS — Z681 Body mass index (BMI) 19 or less, adult: Secondary | ICD-10-CM | POA: Diagnosis not present

## 2015-10-31 DIAGNOSIS — Z0000 Encounter for general adult medical examination without abnormal findings: Secondary | ICD-10-CM | POA: Diagnosis not present

## 2016-01-14 DIAGNOSIS — Z0100 Encounter for examination of eyes and vision without abnormal findings: Secondary | ICD-10-CM | POA: Diagnosis not present

## 2016-01-14 DIAGNOSIS — H5200 Hypermetropia, unspecified eye: Secondary | ICD-10-CM | POA: Diagnosis not present

## 2016-01-14 DIAGNOSIS — I10 Essential (primary) hypertension: Secondary | ICD-10-CM | POA: Diagnosis not present

## 2016-01-21 DIAGNOSIS — M545 Low back pain: Secondary | ICD-10-CM | POA: Diagnosis not present

## 2016-01-21 DIAGNOSIS — Z96641 Presence of right artificial hip joint: Secondary | ICD-10-CM | POA: Diagnosis not present

## 2016-02-06 ENCOUNTER — Ambulatory Visit (HOSPITAL_COMMUNITY): Payer: Medicare HMO | Attending: Physician Assistant

## 2016-02-06 DIAGNOSIS — M546 Pain in thoracic spine: Secondary | ICD-10-CM | POA: Insufficient documentation

## 2016-02-06 DIAGNOSIS — M6281 Muscle weakness (generalized): Secondary | ICD-10-CM | POA: Insufficient documentation

## 2016-02-06 DIAGNOSIS — M545 Low back pain, unspecified: Secondary | ICD-10-CM

## 2016-02-06 DIAGNOSIS — R293 Abnormal posture: Secondary | ICD-10-CM | POA: Insufficient documentation

## 2016-02-06 NOTE — Therapy (Signed)
[name], [place], [address] Phone: [phone]   Fax:  [phone]  Physical Therapy Evaluation  Patient Details  Name: Doris Ford MRN: [id number] Date of Birth: 1941/05/03 Referring Provider: Benita Stabile   Encounter Date: 02/06/2016      PT End of Session - 02/06/16 1229    Visit Number 1   Number of Visits 24   Date for PT Re-Evaluation 03/08/16   Authorization Type [employer]    Authorization Time Period 02/06/16-04/08/16   Authorization - Visit Number 1   Authorization - Number of Visits 24   PT Start Time 1115   PT Stop Time 1205   PT Time Calculation (min) 50 min   Activity Tolerance Patient tolerated treatment well;Patient limited by pain   Behavior During Therapy [facility] for tasks assessed/performed      Past Medical History  Diagnosis Date  . Interstitial cystitis   . Osteopenia     no change  . Hypertension     under control  . Arthritis   . Cancer 12/07    right breast/tx with lumpectomy and tamoxifen    Past Surgical History  Procedure Laterality Date  . Total vaginal hysterectomy  age 75  . Bilateral salpingoophorectomy  age 75  . Knee surgery Bilateral as teen  . Bunionectomy with hammertoe reconstruction Bilateral ~2000  . Tonsillectomy and adenoidectomy  age 11  . Breast surgery Right     lumpectomy-multiple times  . Cataract extraction Bilateral 2007  . Total hip arthroplasty Right 12/16/2013    Procedure: RIGHT TOTAL HIP ARTHROPLASTY ANTERIOR APPROACH;  Surgeon: Mcarthur Rossetti, MD;  Location: [facility];  Service: Orthopedics;  Laterality: Right;    There were no vitals filed for this visit.       Subjective Assessment - 02/06/16 1119    Subjective Pt reports she has had some pain in her low to central back that has persisted since at least 2014. She has been recently told that she has some scoliosis.    Pertinent History R anterior THA 2015; bilat cartilage  debridement in knees in 1950's; history of injections in T spine about 3-4YA.    Limitations Standing;House hold activities   How long can you sit comfortably? about 1 hour (church)    How long can you stand comfortably? 15-20 minutes (kitchen activity/cutting a watermelon)    How long can you walk comfortably? 30-45 minutes (grocery shopping, but without limtting)    Diagnostic tests XRAY    Patient Stated Goals improve funcitonal tolerance with less pain.    Currently in Pain? Yes   Pain Score 2    Pain Location --  R SIJ, L posterior iliac crest; L thoracic spine at convexity.    Pain Orientation Left   Pain Type Chronic pain   Pain Onset Other (comment)   Aggravating Factors  prolonged standing, prolonged sitting errect   Pain Relieving Factors lying down, sitting in recliner, does not aggravated at night while sleeping on back.    Effect of Pain on Daily Activities Has to take additional rests.    Multiple Pain Sites Yes            [facility] PT Assessment - 02/06/16 0001    Assessment   Medical Diagnosis Bilat LBP; Left lateral mid throacic pain.    Referring Provider Benita Stabile    Onset Date/Surgical Date --  4YA   Hand Dominance Right   Next MD  Visit None scheduled    Prior Therapy For THA only 2YA   Precautions   Precautions None   Precaution Comments BMD done multiple times, ISQ  on calcium and D3 supplements   Balance Screen   Has the patient fallen in the past 6 months No   Has the patient had a decrease in activity level because of a fear of falling?  Yes   Is the patient reluctant to leave their home because of a fear of falling?  No   Prior Function   Level of Independence Independent   Vocation Retired   Leisure flowers, needling, reading, scrap booking    Observation/Other Assessments   Focus on Therapeutic Outcomes (FOTO)  42 (58% impaired)    ROM / Strength   AROM / PROM / Strength Strength;PROM   PROM   PROM Assessment Site Hip;Ankle;Thoracic    Right/Left Hip Right;Left   Left Hip Extension 30+  knee straight   Left Hip Flexion --  WNL   Right Ankle Dorsiflexion 17   Left Ankle Dorsiflexion 17   Strength   Strength Assessment Site Hip;Knee;Ankle;Shoulder   Right/Left Shoulder Right;Left   Right Shoulder Horizontal ABduction --  Prone: post delt: 3+/5; middle trap 3/5   Left Shoulder Horizontal ABduction --  Prone: post delt: 3+/5; middle trap 3/5   Right/Left Hip Right;Left   Right Hip Flexion 4-/5   Right Hip Extension 3-/5  knee straight   Right Hip External Rotation  4-/5   Right Hip Internal Rotation 5/5   Right Hip ABduction --  seated hip at 90*: 3+/5; sidelying not tested   Left Hip Flexion 4-/5   Left Hip Extension 2+/5  knee straight   Left Hip External Rotation 4-/5   Left Hip Internal Rotation 5/5   Left Hip ABduction --  seated hip at 90*: 3+/5; sidelying not tested   Right/Left Knee Right;Left   Right Knee Flexion 4-/5   Right Knee Extension 5/5   Left Knee Flexion 4/5   Left Knee Extension 4+/5   Right/Left Ankle Right;Left   Right Ankle Dorsiflexion 5/5   Right Ankle Plantar Flexion --  not assessed at eval for time   Left Ankle Dorsiflexion 5/5   Left Ankle Plantar Flexion --  not assessed at eval for time   Right Hip   Right Hip Extension 30   Right Hip Flexion --  WNL   Thoracic   Thoracic - Right Rotation Seated rotation: 45 degrees   Thoracic - Left Rotation seated rotation: 60 degrees   Flexibility   Hamstrings Passive SLR , hip @ 90*  *not performed at eval due to time.   Quadriceps Prone Ely's Test  (+) bilat: R 75% > L    Palpation   Spinal mobility fixed lumbar lordosis; fixed thoracic kyphosis, left mid T spine convexity   sharp cervical lordosis between C6-C4   Palpation comment Lumbar, thoracic, and Left rib springing is painful and unchanging   Transfers   Five time sit to stand comments  20.88s  reproduces pain in bilat SIJ at #5   Ambulation/Gait   Ambulation  Distance (Feet) 410 Feet   Assistive device None   Gait Pattern Step-through pattern  L fwd arm swing, R post arm swing   Ambulation Surface Level   Gait velocity 1.87m/s   Gait Comments R trendelenburg, R trunk rotation only (minimal)  R LBP started to come on.    Balance   Balance Assessed Yes  High Level Balance   High Level Balance Activities Other (comment)   High Level Balance Comments L SLS: 10s; R SLS: 14s  R glute max weakness evident in T-berg/pelvic IR   Standardized Balance Assessment   Standardized Balance Assessment Timed Up and Go Test;Berg Balance Test;Five Times Sit to Stand             PT Education - Feb 23, 2016 1227    Education provided Yes   Education Details Explained the genral progression of scoliosis and its involvement with pain. Explained that chronicity of pain will determine the prognosis of rehab.    Person(s) Educated Patient   Methods Explanation   Comprehension Verbalized understanding          PT Short Term Goals - 2016-02-23 1252    PT SHORT TERM GOAL #3   Title Pt will improve 5x STS time by 50% by the 4th week of therapy to demonstrate improved indep in functional mobility and to decrease risk of falls in the home.            PT Long Term Goals - 02-23-2016 1252    PT LONG TERM GOAL #3   Title Pt will demonstrate improved SLS time to >20s bilat and 5x STS time to <12s to demonstrate improved strength for funcitonal mobility at home.    Status New               Plan - 2016/02/23 1231    Clinical Impression Statement Pt identifiying painful areas near the R PSIS, L Posterior iliac crest, lower central t spine, and L lower T spine near multiple costovertebral/costotranverse joints. Greatest deficits include weakness in the glutemax bilat R>L, which demonstrates impaired hip stability during balance and gait with trendelenburg sign, weakness in the hip/lumbar extensors with (+) Gower's sign in sitting, progressive rigid thoacic  kyphosis with related hyperlordosis of the cervical spine. Pt presenting with impairment of strength, mobility, soft tissue length, postural abnormality, chronic pain, and actvity tolerance, all contributing to limittations in AD, IADL, and leisure actvity. Pt will benefit from skilled PT to address the above in order to improve tolerance to daily functional movement and activities of daily living.    Rehab Potential Good   Clinical Impairments Affecting Rehab Potential Osteopenia, chronicity of pain, scoliosis   PT Frequency 3x / week   PT Duration 8 weeks   PT Treatment/Interventions Moist Heat;Therapeutic exercise;Therapeutic activities;Gait training;Stair training;Balance training;Passive range of motion;Patient/family education;Functional mobility training   PT Next Visit Plan HEP develpment, [employer] and Agree with Plan of Care Patient      Patient will benefit from skilled therapeutic intervention in order to improve the following deficits and impairments:  Abnormal gait, Decreased range of motion, Impaired vision/preception, Decreased knowledge of precautions, Decreased balance, Decreased strength, Decreased mobility, Hypomobility, Impaired flexibility, Pain  Visit Diagnosis: Bilateral low back pain without sciatica  Pain in thoracic spine  Muscle weakness (generalized)  Abnormal posture      G-Codes - 02/23/2016 1247    Functional Assessment Tool Used CLinical Judgment    Functional Limitation Mobility: Walking and moving around   Mobility: Walking and Moving Around Current Status [phone]) At least 20 percent but less than 40 percent impaired, limited or restricted   Mobility: Walking and Moving Around Goal Status [phone]) At least 1 percent but less than 20 percent impaired, limited or restricted       Problem List Patient Active Problem List   Diagnosis Date Noted  .  Arthritis of right hip 12/16/2013  . Status post THR (total hip replacement) 12/16/2013     12:53 PM, 02/06/2016 Etta Grandchild, PT, DPT Physical Therapist at [place] [phone] (office)     [facility] [address] [place], [place], [address] Phone: [phone]   Fax:  [phone]  Name: KEASHA SIRMONS MRN: [id number] Date of Birth: Apr 12, 1941

## 2016-02-08 ENCOUNTER — Ambulatory Visit (HOSPITAL_COMMUNITY): Payer: Medicare HMO | Admitting: Physical Therapy

## 2016-02-08 DIAGNOSIS — M545 Low back pain, unspecified: Secondary | ICD-10-CM

## 2016-02-08 DIAGNOSIS — R293 Abnormal posture: Secondary | ICD-10-CM

## 2016-02-08 DIAGNOSIS — M546 Pain in thoracic spine: Secondary | ICD-10-CM

## 2016-02-08 DIAGNOSIS — M6281 Muscle weakness (generalized): Secondary | ICD-10-CM

## 2016-02-08 NOTE — Therapy (Signed)
[place], [place], [address] Phone: [phone]   Fax:  [phone]  Physical Therapy Treatment  Patient Details  Name: Doris Ford MRN: [id number] Date of Birth: 1941/02/14 Referring Provider: Benita Stabile   Encounter Date: 02/08/2016      PT End of Session - 02/08/16 1126    Visit Number 2   Number of Visits 24   Date for PT Re-Evaluation 03/08/16   Authorization Type [employer] Medicare    Authorization Time Period 02/06/16-04/08/16   Authorization - Visit Number 2   Authorization - Number of Visits 24   PT Start Time [id number]   PT Stop Time 1116   PT Time Calculation (min) 41 min   Activity Tolerance Patient tolerated treatment well   Behavior During Therapy [facility] for tasks assessed/performed      Past Medical History  Diagnosis Date  . Interstitial cystitis   . Osteopenia     no change  . Hypertension     under control  . Arthritis   . Cancer 12/07    right breast/tx with lumpectomy and tamoxifen    Past Surgical History  Procedure Laterality Date  . Total vaginal hysterectomy  age 31  . Bilateral salpingoophorectomy  age 37s  . Knee surgery Bilateral as teen  . Bunionectomy with hammertoe reconstruction Bilateral ~2000  . Tonsillectomy and adenoidectomy  age 38  . Breast surgery Right     lumpectomy-multiple times  . Cataract extraction Bilateral 2007  . Total hip arthroplasty Right 12/16/2013    Procedure: RIGHT TOTAL HIP ARTHROPLASTY ANTERIOR APPROACH;  Surgeon: Mcarthur Rossetti, MD;  Location: [facility];  Service: Orthopedics;  Laterality: Right;    There were no vitals filed for this visit.      Subjective Assessment - 02/08/16 1038    Subjective Pt reports not much has changed since the initial evaluation. She reports aching pain when she was sitting out in the waiting room. No pain currently.    Pertinent History R anterior THA 2015; bilat cartilage debridement in knees in 1950's;  history of injections in T spine about 3-4YA.    Limitations Standing;House hold activities   How long can you sit comfortably? about 1 hour (church)    How long can you stand comfortably? 15-20 minutes (kitchen activity/cutting a watermelon)    How long can you walk comfortably? 30-45 minutes (grocery shopping, but without limtting)    Diagnostic tests XRAY    Patient Stated Goals improve funcitonal tolerance with less pain.    Currently in Pain? No/denies   Pain Onset Other (comment)                         [name] - 02/08/16 0001    Exercises   Exercises Lumbar   Lumbar Exercises: Seated   Other Seated Lumbar Exercises trunk rotation x10 reps each side   Lumbar Exercises: Supine   Ab Set 10 reps;5 seconds   AB Set Limitations tactile/verbal cues for correct technique   Bent Knee Raise 10 reps   Bent Knee Raise Limitations with ab set   Bridge 15 reps   Lumbar Exercises: Sidelying   Clam 15 reps  x15 without band, x10 with red TB   Clam Limitations primarily reps/sets for demonstrating technique for HEP                PT Education - 02/08/16 1124  Education provided Yes   Education Details initiated/reviewed HEP; discussed importance of deep abdominal strength/endurance during activity   Person(s) Educated Patient   Methods Explanation;Demonstration;Handout   Comprehension Returned demonstration;Verbalized understanding          PT Short Term Goals - 02/06/16 1252    PT SHORT TERM GOAL #3   Title Pt will improve 5x STS time by 50% by the 4th week of therapy to demonstrate improved indep in functional mobility and to decrease risk of falls in the home.            PT Long Term Goals - 02/06/16 1252    PT LONG TERM GOAL #3   Title Pt will demonstrate improved SLS time to >20s bilat and 5x STS time to <12s to demonstrate improved strength for funcitonal mobility at home.    Status New               Plan  - 02/08/16 1129    Clinical Impression Statement Today's session focused on introduction and review of therex to address impairments found in the initial evaluation. Pt was able to perform all exercises targeting hip and trunk musculature with minimal verbal/tactile cues for technique. Also introduced postural exercises this visit. Encouraged daily adherence and pt verbalized understanding at this time.   Rehab Potential Good   Clinical Impairments Affecting Rehab Potential Osteopenia, chronicity of pain, scoliosis   PT Frequency 3x / week   PT Duration 8 weeks   PT Treatment/Interventions Moist Heat;Therapeutic exercise;Therapeutic activities;Gait training;Stair training;Balance training;Passive range of motion;Patient/family education;Functional mobility training   PT Next Visit Plan Thomas Test; progress hip strength, abdmonimal strength/stability therex progression from bent knee raises   PT Home Exercise Plan Bridge, clamshell red TB, chin tucks, scapular retraction, bent knee raise with ab set, seated lower trunk rotation    Consulted and Agree with Plan of Care Patient      Patient will benefit from skilled therapeutic intervention in order to improve the following deficits and impairments:  Abnormal gait, Decreased range of motion, Impaired vision/preception, Decreased knowledge of precautions, Decreased balance, Decreased strength, Decreased mobility, Hypomobility, Impaired flexibility, Pain  Visit Diagnosis: Bilateral low back pain without sciatica  Pain in thoracic spine  Muscle weakness (generalized)  Abnormal posture     Problem List Patient Active Problem List   Diagnosis Date Noted  . Arthritis of right hip 12/16/2013  . Status post THR (total hip replacement) 12/16/2013   11:41 AM,02/08/2016 Elly Modena PT, DPT [facility] [address] [place], [place],  [address] Phone: [phone]   Fax:  [phone]  Name: Doris Ford MRN: [id number] Date of Birth: [birth year]

## 2016-02-11 ENCOUNTER — Encounter: Payer: Self-pay | Admitting: Obstetrics & Gynecology

## 2016-02-11 ENCOUNTER — Ambulatory Visit (INDEPENDENT_AMBULATORY_CARE_PROVIDER_SITE_OTHER): Payer: Medicare HMO | Admitting: Obstetrics & Gynecology

## 2016-02-11 VITALS — BP 120/68 | HR 76 | Resp 14 | Ht 62.0 in | Wt 118.0 lb

## 2016-02-11 DIAGNOSIS — Z01419 Encounter for gynecological examination (general) (routine) without abnormal findings: Secondary | ICD-10-CM

## 2016-02-11 NOTE — Progress Notes (Signed)
75 y.o. G2P2 Married Caucasian F here for annual exam.  Reports she is doing ok but her husband has been having a lot of joint aches.  He had shoulder surgery and knee replacement last week.  He's having accupunture.    She denies vaginal bleeding.    Reports having muscle spasms in her back due to particular movements.  Saw PA with Dr. Ninfa Linden and started PT last week.  Working on core strength and stretches.  PCP:  Dr. Osborne Casco.  She sees him typically in March.  Reports blood work was done then and was normal.    No LMP recorded. Patient has had a hysterectomy.          Sexually active: No.  The current method of family planning is none.    Exercising: Yes.    core strength exercises Smoker:  no  Health Maintenance: Pap:  2011 normal  History of abnormal Pap:  no MMG:  10/22/2015 BIRADS 1 negative  Colonoscopy:  12/20/2010 scattered diverticula, repeat 10 years BMD:   Pt states in March this year at [facility], osteopenia TDaP:  Unsure, done in Dr. Loren Racer office Pneumonia vaccine(s):  Pt states a few years ago  Zostavax:   Pt states a few years ago Hep C testing: not indicated  Screening Labs: PCP, Hb today: PCP, Urine today: PCP   reports that she has never smoked. She has never used smokeless tobacco. She reports that she does not drink alcohol or use illicit drugs.  Past Medical History  Diagnosis Date  . Interstitial cystitis   . Osteopenia     no change  . Hypertension     under control  . Arthritis   . Cancer 12/07    right breast/tx with lumpectomy and tamoxifen    Past Surgical History  Procedure Laterality Date  . Total vaginal hysterectomy  age 34  . Bilateral salpingoophorectomy  age 37s  . Knee surgery Bilateral as teen  . Bunionectomy with hammertoe reconstruction Bilateral ~2000  . Tonsillectomy and adenoidectomy  age 38  . Breast surgery Right     lumpectomy-multiple times  . Cataract extraction Bilateral 2007  . Total hip arthroplasty Right  12/16/2013    Procedure: RIGHT TOTAL HIP ARTHROPLASTY ANTERIOR APPROACH;  Surgeon: Mcarthur Rossetti, MD;  Location: [facility];  Service: Orthopedics;  Laterality: Right;    Current Outpatient Prescriptions  Medication Sig Dispense Refill  . Ascorbic Acid (VITAMIN C PO) Take 1,000 mg by mouth daily.     . Calcium-Vitamin D (CALTRATE 600 PLUS-VIT D PO) Take 600 mg by mouth daily.    . chlorthalidone (HYGROTON) 25 MG tablet Take 25 mg by mouth every morning.     . Cholecalciferol (VITAMIN D PO) Take 1,000 Units by mouth daily.     . Multiple Vitamins-Minerals (OCUVITE PO) Take 1 tablet by mouth daily.     . naproxen sodium (ANAPROX) 220 MG tablet Take 220 mg by mouth as needed.    . Omega-3 Fatty Acids (OMEGA 3 PO) Take 1 g by mouth daily.     [name] OVER THE COUNTER MEDICATION Take 1 tablet by mouth 2 (two) times daily. Cysta Q supplyment    . quinapril (ACCUPRIL) 20 MG tablet Take 20 mg by mouth 2 (two) times daily.    . RESTASIS 0.05 % ophthalmic emulsion INSTILL 1 DROP INTO BOTH EYES TWICE A DAY  6   No current facility-administered medications for this visit.    Family History  Problem Relation Age of Onset  . Cancer Mother     breast/mastectomy  . Hypertension Father   . Heart disease Mother     poor circulation  . Deep vein thrombosis Mother   . Osteoporosis Mother     ROS:  Pertinent items are noted in HPI.  Otherwise, a comprehensive ROS was negative.  Exam:   Filed Vitals:   02/11/16 1314  BP: 120/68  Pulse: 76  Resp: 14  Height: 5\' 2"  (1.575 m)  Weight: 118 lb (53.524 kg)   General appearance: alert, cooperative and appears stated age Head: Normocephalic, without obvious abnormality, atraumatic Neck: no adenopathy, supple, symmetrical, trachea midline and thyroid normal to inspection and palpation Lungs: clear to auscultation bilaterally Breasts: normal appearance, no masses or tenderness, two well healed scars on right breast Heart: regular rate and  rhythm Abdomen: soft, non-tender; bowel sounds normal; no masses,  no organomegaly Extremities: extremities normal, atraumatic, no cyanosis or edema Skin: Skin color, texture, turgor normal. No rashes or lesions Lymph nodes: Cervical, supraclavicular, and axillary nodes normal. No abnormal inguinal nodes palpated Neurologic: Grossly normal   Pelvic: External genitalia:  no lesions              Urethra:  normal appearing urethra with no masses, tenderness or lesions              Bartholins and Skenes: normal                 Vagina: normal appearing vagina with normal color and discharge, no lesions              Cervix: absent              Pap taken: No. Bimanual Exam:  Uterus:  uterus absent              Adnexa: no mass, fullness, tenderness               Rectovaginal: Confirms               Anus:  normal sphincter tone, no lesions  Chaperone was present for exam.  A:  Well Woman with normal exam  H/O TVH/BSO  Vaginal atrophy  H/O R breast cancer s/p lumpectomy and tamoxifen 12/07 S/p right hip replacement 12/16/13 Interstitial cystitis Osteopenia Hypertension Family hx of breast cancer in her mother  P: Mammogram yearly. Grade 3 dense breasts.    Sees PCP yearly for blood work/vaccines return annually or prn

## 2016-02-13 ENCOUNTER — Ambulatory Visit (HOSPITAL_COMMUNITY): Payer: Medicare HMO | Admitting: Physical Therapy

## 2016-02-13 DIAGNOSIS — M545 Low back pain, unspecified: Secondary | ICD-10-CM

## 2016-02-13 DIAGNOSIS — M546 Pain in thoracic spine: Secondary | ICD-10-CM

## 2016-02-13 DIAGNOSIS — R293 Abnormal posture: Secondary | ICD-10-CM

## 2016-02-13 DIAGNOSIS — M6281 Muscle weakness (generalized): Secondary | ICD-10-CM

## 2016-02-13 NOTE — Therapy (Signed)
[place], [place], [address] Phone: [phone]   Fax:  [phone]  Physical Therapy Treatment  Patient Details  Name: Doris Ford MRN: [id number] Date of Birth: 24-Jul-1941 Referring Provider: Benita Stabile   Encounter Date: 02/13/2016      PT End of Session - 02/13/16 1349    Visit Number 3   Number of Visits 24   Date for PT Re-Evaluation 03/08/16   Authorization Type [employer] Medicare    Authorization Time Period 02/06/16-04/08/16   Authorization - Visit Number 3   Authorization - Number of Visits 24   PT Start Time 1300   PT Stop Time 1345   PT Time Calculation (min) 45 min   Equipment Utilized During Treatment Right knee immobilizer   Activity Tolerance Patient tolerated treatment well   Behavior During Therapy [facility] for tasks assessed/performed      Past Medical History  Diagnosis Date  . Interstitial cystitis   . Osteopenia     no change  . Hypertension     under control  . Arthritis   . Cancer ([place]) 12/07    right breast/tx with lumpectomy and tamoxifen    Past Surgical History  Procedure Laterality Date  . Total vaginal hysterectomy  age 10  . Bilateral salpingoophorectomy  age 70s  . Knee surgery Bilateral as teen  . Bunionectomy with hammertoe reconstruction Bilateral ~2000  . Tonsillectomy and adenoidectomy  age 57  . Breast surgery Right     lumpectomy-multiple times  . Cataract extraction Bilateral 2007  . Total hip arthroplasty Right 12/16/2013    Procedure: RIGHT TOTAL HIP ARTHROPLASTY ANTERIOR APPROACH;  Surgeon: Mcarthur Rossetti, MD;  Location: [facility];  Service: Orthopedics;  Laterality: Right;    There were no vitals filed for this visit.      Subjective Assessment - 02/13/16 1319    Subjective PT states she is "dealing with it".  Currently 3/10 pain, reports compliance with HEP.   Currently in Pain? Yes   Pain Score 3    Pain Location Sacrum   Pain Orientation Right    Pain Descriptors / Indicators Constant   Pain Type Chronic pain   Pain Radiating Towards sometimes radiates over to Left SI and up into Lt thoracic region.                         [facility] Adult PT Treatment/Exercise - 02/13/16 0001    Lumbar Exercises: Stretches   Piriformis Stretch 2 reps;30 seconds   Piriformis Stretch Limitations seated   Lumbar Exercises: Supine   Bent Knee Raise 15 reps   Bent Knee Raise Limitations with ab set   Bridge 15 reps   Straight Leg Raise 10 reps   Lumbar Exercises: Sidelying   Hip Abduction 10 reps   Lumbar Exercises: Prone   Straight Leg Raise 10 reps   Other Prone Lumbar Exercises hamstring curls 15 reps each   Other Prone Lumbar Exercises POE 2 minutes   Manual Therapy   Manual Therapy Myofascial release;Soft tissue mobilization;Other (comment)   Manual therapy comments completed seperately from all other skilled interventions   Soft tissue mobilization to Rt glute in prone   Myofascial Release to Rt glute in prone andRt psoas release in supine   Other Manual Therapy MET for Rt anterior pelvic rotation                  PT  Short Term Goals - 02/06/16 1252    PT SHORT TERM GOAL #3   Title Pt will improve 5x STS time by 50% by the 4th week of therapy to demonstrate improved indep in functional mobility and to decrease risk of falls in the home.            PT Long Term Goals - 02/06/16 1252    PT LONG TERM GOAL #3   Title Pt will demonstrate improved SLS time to >20s bilat and 5x STS time to <12s to demonstrate improved strength for funcitonal mobility at home.    Status New               Plan - 02/13/16 1350    Clinical Impression Statement Continued focus on core stability, functional strengthening and stretching.  MET completed for Rt SI rotation and initiated Rt single bridge (with Lt knee straight and foot on mat).  Noted weakness in Rt glute.  Added prone hip extension and completed manual to tight  deeper piriformis and surrounding musculature as well as supine Rt psoas release.  Encouraged pateint to attempt prone lying at home to help promote lumbar extension.  Added seated piriformis stretch to HEP as well.  Pt reported no significant change at end of session.    Rehab Potential Good   Clinical Impairments Affecting Rehab Potential Osteopenia, chronicity of pain, scoliosis   PT Frequency 3x / week   PT Duration 8 weeks   PT Treatment/Interventions Moist Heat;Therapeutic exercise;Therapeutic activities;Gait training;Stair training;Balance training;Passive range of motion;Patient/family education;Functional mobility training   PT Next Visit Plan Progress Rt glute strength and abdmonimal strength/stability.  Assess SI each visit with manual techniques as neede.     PT Home Exercise Plan addition of seated piriformis stretch   Consulted and Agree with Plan of Care Patient      Patient will benefit from skilled therapeutic intervention in order to improve the following deficits and impairments:  Abnormal gait, Decreased range of motion, Impaired vision/preception, Decreased knowledge of precautions, Decreased balance, Decreased strength, Decreased mobility, Hypomobility, Impaired flexibility, Pain  Visit Diagnosis: Bilateral low back pain without sciatica  Pain in thoracic spine  Muscle weakness (generalized)  Abnormal posture     Problem List Patient Active Problem List   Diagnosis Date Noted  . Arthritis of right hip 12/16/2013  . Status post THR (total hip replacement) 12/16/2013    Teena Irani, PTA/CLT [phone]  02/13/2016, 1:55 PM  [facility] [address] [place], [place], [address] Phone: [phone]   Fax:  [phone]  Name: Doris Ford MRN: [id number] Date of Birth: 1941/07/04

## 2016-02-15 ENCOUNTER — Ambulatory Visit (HOSPITAL_COMMUNITY): Payer: Medicare HMO

## 2016-02-15 DIAGNOSIS — R293 Abnormal posture: Secondary | ICD-10-CM

## 2016-02-15 DIAGNOSIS — M6281 Muscle weakness (generalized): Secondary | ICD-10-CM

## 2016-02-15 DIAGNOSIS — M545 Low back pain, unspecified: Secondary | ICD-10-CM

## 2016-02-15 DIAGNOSIS — M546 Pain in thoracic spine: Secondary | ICD-10-CM

## 2016-02-15 NOTE — Therapy (Signed)
[place], [place], [address] Phone: [phone]   Fax:  [phone]  Physical Therapy Treatment  Patient Details  Name: Doris Ford MRN: [id number] Date of Birth: [birth year] Referring Provider: Benita Stabile   Encounter Date: 02/15/2016      PT End of Session - 02/15/16 0851    Visit Number 4   Number of Visits 24   Date for PT Re-Evaluation 03/08/16   Authorization Type [employer] Medicare    Authorization Time Period 02/06/16-04/08/16   Authorization - Visit Number 4   Authorization - Number of Visits 24   PT Start Time 0816   PT Stop Time 0856   PT Time Calculation (min) 40 min   Activity Tolerance Patient tolerated treatment well   Behavior During Therapy [facility] for tasks assessed/performed      Past Medical History  Diagnosis Date  . Interstitial cystitis   . Osteopenia     no change  . Hypertension     under control  . Arthritis   . Cancer ([place]) 12/07    right breast/tx with lumpectomy and tamoxifen    Past Surgical History  Procedure Laterality Date  . Total vaginal hysterectomy  age 24  . Bilateral salpingoophorectomy  age 59s  . Knee surgery Bilateral as teen  . Bunionectomy with hammertoe reconstruction Bilateral ~2000  . Tonsillectomy and adenoidectomy  age 29  . Breast surgery Right     lumpectomy-multiple times  . Cataract extraction Bilateral 2007  . Total hip arthroplasty Right 12/16/2013    Procedure: RIGHT TOTAL HIP ARTHROPLASTY ANTERIOR APPROACH;  Surgeon: Mcarthur Rossetti, MD;  Location: [facility];  Service: Orthopedics;  Laterality: Right;    There were no vitals filed for this visit.      Subjective Assessment - 02/15/16 0820    Subjective Pt reports no major changes in pain yet, simply sore from additional activities. She has questiosn regarding some HEP activities.    Pertinent History R anterior THA 2015; bilat cartilage debridement in knees in 1950's; history of  injections in T spine about 3-4YA.    Currently in Pain? No/denies  just some muslce soreness                         [name] - 02/15/16 0001    Lumbar Exercises: Stretches   Piriformis Stretch Other (comment)   Piriformis Stretch Limitations --  Do not perform in future sessions.    Lumbar Exercises: Standing   Scapular Retraction Both;15 reps  15x3sH (HEP review)    Lumbar Exercises: Seated   Other Seated Lumbar Exercises trunk rotation chair stretch   2x30s alt Bilat, (HEP review)   Lumbar Exercises: Supine   Clam 10 reps  2x10, redTB, seated (HEP review)    Clam Limitations volley ball between feet   Bent Knee Raise 10 reps   Bent Knee Raise Limitations with ab set  VC to abd breath between reps to relax/reset abs   Bridge 10 reps  2x10, feet/knees 5in apart (HEP review)    Bridge Limitations discussed heel distance from hips to involve HS and lumbar spine    Other Supine Lumbar Exercises Cervical Restraction: 3 pillow smash   15x5sH in hooklying (HEP review)   Lumbar Exercises: Sidelying   Clam --  *performed seated (see above)  PT Short Term Goals - 02/06/16 1252    PT SHORT TERM GOAL #3   Title Pt will improve 5x STS time by 50% by the 4th week of therapy to demonstrate improved indep in functional mobility and to decrease risk of falls in the home.            PT Long Term Goals - 02/06/16 1252    PT LONG TERM GOAL #3   Title Pt will demonstrate improved SLS time to >20s bilat and 5x STS time to <12s to demonstrate improved strength for funcitonal mobility at home.    Status New               Plan - 02/15/16 [id number]    Clinical Impression Statement HEP review this session, and form corrected. Pt is very meticulous with details and motivated to perform these correctly. She tolerates sessionw ell, abel to prgress all activities without exacerbation of symptoms. No changes to POC at this  time.    Rehab Potential Good   Clinical Impairments Affecting Rehab Potential Osteopenia, chronicity of pain, scoliosis   PT Frequency 3x / week   PT Duration 8 weeks   PT Treatment/Interventions Moist Heat;Therapeutic exercise;Therapeutic activities;Gait training;Stair training;Balance training;Passive range of motion;Patient/family education;Functional mobility training   PT Next Visit Plan Continue with abdominal progression, progress HEP activities. No need to add in new exercises at this time in clinic; [place]; AVOID PIRIFORMIS STRETCH.    PT Home Exercise Plan updated; moved chin tucks to supine.    Consulted and Agree with Plan of Care Patient      Patient will benefit from skilled therapeutic intervention in order to improve the following deficits and impairments:  Abnormal gait, Decreased range of motion, Impaired vision/preception, Decreased knowledge of precautions, Decreased balance, Decreased strength, Decreased mobility, Hypomobility, Impaired flexibility, Pain  Visit Diagnosis: Bilateral low back pain without sciatica  Pain in thoracic spine  Muscle weakness (generalized)  Abnormal posture     Problem List Patient Active Problem List   Diagnosis Date Noted  . Arthritis of right hip 12/16/2013  . Status post THR (total hip replacement) 12/16/2013    9:02 AM, 02/15/2016 Etta Grandchild, PT, DPT Physical Therapist - [place] [phone] (mobile)   [facility] [address] [place], [place], [address] Phone: [phone]   Fax:  [phone]  Name: Doris Ford MRN: [id number] Date of Birth: 12-Sep-1940

## 2016-02-18 ENCOUNTER — Ambulatory Visit (HOSPITAL_COMMUNITY): Payer: Medicare HMO | Admitting: Physical Therapy

## 2016-02-18 DIAGNOSIS — M546 Pain in thoracic spine: Secondary | ICD-10-CM

## 2016-02-18 DIAGNOSIS — M545 Low back pain, unspecified: Secondary | ICD-10-CM

## 2016-02-18 DIAGNOSIS — R293 Abnormal posture: Secondary | ICD-10-CM

## 2016-02-18 DIAGNOSIS — M6281 Muscle weakness (generalized): Secondary | ICD-10-CM

## 2016-02-18 NOTE — Therapy (Signed)
[place], [place], [address] Phone: [phone]   Fax:  [phone]  Physical Therapy Treatment  Patient Details  Name: Doris Ford MRN: [id number] Date of Birth: 01/03/1941 Referring Provider: Benita Stabile   Encounter Date: 02/18/2016      PT End of Session - 02/18/16 1426    Visit Number 5   Number of Visits 24   Date for PT Re-Evaluation 03/08/16   Authorization Type [employer]na Medicare    Authorization Time Period 02/06/16-04/08/16   Authorization - Visit Number 5   Authorization - Number of Visits 10   PT Start Time [id number]   PT Stop Time 1426   PT Time Calculation (min) 38 min   Activity Tolerance Patient tolerated treatment well   Behavior During Therapy [facility] for tasks assessed/performed      Past Medical History:  Diagnosis Date  . Arthritis   . Cancer ([place]) 12/07   right breast/tx with lumpectomy and tamoxifen  . Hypertension    under control  . Interstitial cystitis   . Osteopenia    no change    Past Surgical History:  Procedure Laterality Date  . BILATERAL SALPINGOOPHORECTOMY  age 65s  . BREAST SURGERY Right    lumpectomy-multiple times  . BUNIONECTOMY WITH HAMMERTOE RECONSTRUCTION Bilateral ~2000  . CATARACT EXTRACTION Bilateral 2007  . KNEE SURGERY Bilateral as teen  . TONSILLECTOMY AND ADENOIDECTOMY  age 49  . 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 TOTAL VAGINAL HYSTERECTOMY  age 82    There were no vitals filed for this visit.      Subjective Assessment - 02/18/16 1350    Subjective Patient arrives today stating that she is feeling well, she has no pain until she tries some activities and it gets flared up. She rates herself as a 50/100 on a subjective scale and this is mostly due to pain with extended activities.    Pertinent History R anterior THA 2015; bilat cartilage debridement in knees in 1950's; history of injections in T spine about 3-4YA.    Currently in Pain? No/denies                         [facility] Adult PT Treatment/Exercise - 02/18/16 0001      Lumbar Exercises: Standing   Heel Raises 10 reps   Heel Raises Limitations heel and toe    Forward Lunge 10 reps   Forward Lunge Limitations 4 inch box, core set    Other Standing Lumbar Exercises 2D hip excursions 1x10  no rotation      Lumbar Exercises: Supine   Ab Set 5 reps;20 reps   Bridge 15 reps;Other (comment)  2 sets    Other Supine Lumbar Exercises supine hip abductions red TB 2x10      Lumbar Exercises: Prone   Straight Leg Raise 10 reps   Other Prone Lumbar Exercises hamstring curls 15 reps each, 2#    Other Prone Lumbar Exercises POE 2 minutes             Balance Exercises - 02/18/16 1425      Balance Exercises: Standing   Standing Eyes Closed Narrow base of support (BOS);4 reps;20 secs   Tandem Stance Eyes open;3 reps;15 secs  PT Education - 02/18/16 1426    Education provided Yes   Education Details may be a little extra sore after new progression of activities    Person(s) Educated Patient   Methods Explanation   Comprehension Verbalized understanding          PT Short Term Goals - 02/06/16 1252      PT SHORT TERM GOAL #3   Title Pt will improve 5x STS time by 50% by the 4th week of therapy to demonstrate improved indep in functional mobility and to decrease risk of falls in the home.            PT Long Term Goals - 02/06/16 1252      PT LONG TERM GOAL #3   Title Pt will demonstrate improved SLS time to >20s bilat and 5x STS time to <12s to demonstrate improved strength for funcitonal mobility at home.    Status New               Plan - 02/18/16 1427    Clinical Impression Statement Continued with functional exercise in supine and prone, then progressed to  introduction of standing exercises to patient tolerance as well as balance training today. Patient did require occasional cues for form to reduce pain and correct mechanics however shows good form throughout majority of session today.    Rehab Potential Good   Clinical Impairments Affecting Rehab Potential Osteopenia, chronicity of pain, scoliosis   PT Frequency 3x / week   PT Duration 8 weeks   PT Treatment/Interventions Moist Heat;Therapeutic exercise;Therapeutic activities;Gait training;Stair training;Balance training;Passive range of motion;Patient/family education;Functional mobility training   PT Next Visit Plan Continue with abdominal progression, progress HEP activities. No need to add in new exercises at this time in clinic; [place]; AVOID PIRIFORMIS STRETCH.    Consulted and Agree with Plan of Care Patient      Patient will benefit from skilled therapeutic intervention in order to improve the following deficits and impairments:  Abnormal gait, Decreased range of motion, Impaired vision/preception, Decreased knowledge of precautions, Decreased balance, Decreased strength, Decreased mobility, Hypomobility, Impaired flexibility, Pain  Visit Diagnosis: Bilateral low back pain without sciatica  Pain in thoracic spine  Muscle weakness (generalized)  Abnormal posture     Problem List Patient Active Problem List   Diagnosis Date Noted  . Arthritis of right hip 12/16/2013  . Status post THR (total hip replacement) 12/16/2013    Deniece Ree PT, DPT [place] [address] [place], [place], [address] Phone: [phone]   Fax:  [phone]  Name: Doris Ford MRN: [id number] Date of Birth: [birth year]

## 2016-02-20 ENCOUNTER — Ambulatory Visit (HOSPITAL_COMMUNITY): Payer: Medicare HMO | Admitting: Physical Therapy

## 2016-02-20 DIAGNOSIS — R293 Abnormal posture: Secondary | ICD-10-CM

## 2016-02-20 DIAGNOSIS — M545 Low back pain, unspecified: Secondary | ICD-10-CM

## 2016-02-20 DIAGNOSIS — M546 Pain in thoracic spine: Secondary | ICD-10-CM

## 2016-02-20 DIAGNOSIS — M6281 Muscle weakness (generalized): Secondary | ICD-10-CM

## 2016-02-20 NOTE — Therapy (Signed)
[place], [place], [address] Phone: [phone]   Fax:  [phone]  Physical Therapy Treatment  Patient Details  Name: Doris Ford MRN: [id number] Date of Birth: [birth year] Referring Provider: Benita Stabile   Encounter Date: 02/20/2016      Ford End of Session - 02/20/16 1412    Visit Number 6   Number of Visits 24   Date for Ford Re-Evaluation 03/08/16   Authorization Type [employer] Medicare    Authorization Time Period 02/06/16-04/08/16   Authorization - Visit Number 6   Authorization - Number of Visits 10   Ford Start Time 1318   Ford Stop Time 1400   Ford Time Calculation (min) 42 min   Activity Tolerance Patient tolerated treatment well   Behavior During Therapy [facility] for tasks assessed/performed      Past Medical History:  Diagnosis Date  . Arthritis   . Cancer ([place]) 12/07   right breast/tx with lumpectomy and tamoxifen  . Hypertension    under control  . Interstitial cystitis   . Osteopenia    no change    Past Surgical History:  Procedure Laterality Date  . BILATERAL SALPINGOOPHORECTOMY  age 50s  . BREAST SURGERY Right    lumpectomy-multiple times  . BUNIONECTOMY WITH HAMMERTOE RECONSTRUCTION Bilateral ~2000  . CATARACT EXTRACTION Bilateral 2007  . KNEE SURGERY Bilateral as teen  . TONSILLECTOMY AND ADENOIDECTOMY  age 71  . 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 TOTAL VAGINAL HYSTERECTOMY  age 40    There were no vitals filed for this visit.                       [facility] Adult Ford Treatment/Exercise - 02/20/16 0001      Lumbar Exercises: Standing   Heel Raises 20 reps   Heel Raises Limitations heel      Lumbar Exercises: Seated   Other Seated Lumbar Exercises progression of core work: seated alterate UE/LE flexion with  core set 1x10, lateral felxion on air pad 1x5 each side; on swiss ball in // bars seated marches on ball, LAQS 1x10     Lumbar Exercises: Supine   Ab Set 5 reps;20 reps   Bridge 15 reps   Bridge Limitations 2 sets    Other Supine Lumbar Exercises supine hip abductions red TB 2x10    Other Supine Lumbar Exercises alternating UE/LE with core set 1x15     Lumbar Exercises: Prone   Straight Leg Raise 10 reps   Other Prone Lumbar Exercises hamstring curls 15 reps each, 2#    Other Prone Lumbar Exercises --             Balance Exercises - 02/20/16 1416      Balance Exercises: Standing   Tandem Stance Eyes open;3 reps;15 secs;Foam/compliant surface   SLS Eyes open;Solid surface;2 reps;15 secs           Ford Education - 02/20/16 1412    Education provided Yes   Education Details updated HEP for lateral trunk flexion on couch, safety recommendations for use of exercise swiss ball at home    Person(s) Educated Patient   Methods Explanation   Comprehension Verbalized understanding          Ford Short Term Goals - 02/06/16 1252  Ford SHORT TERM GOAL #3   Title Ford will improve 5x STS time by 50% by the 4th week of therapy to demonstrate improved indep in functional mobility and to decrease risk of falls in the home.            Ford Long Term Goals - 02/06/16 1252      Ford LONG TERM GOAL #3   Title Ford will demonstrate improved SLS time to >20s bilat and 5x STS time to <12s to demonstrate improved strength for funcitonal mobility at home.    Status New               Plan - 02/20/16 1413    Clinical Impression Statement Patient arrived today stating some increased discomfort secondary to being up on her feet quite a bit earlier today; continued with functional exercises as well as strong focus on core progression in supine and sitting on swiss ball and air pad. Patient does demonstrate some moderate difficulty with core work on air pad/swiss ball and required Min guard  for safety, also demonstrated muscle fatigue in core groups with focused core exercise. Educated regarding lateral trunk strength exercise at home as well as safety recommendations for swiss ball use as she expressed interest in trying this at home as well.    Rehab Potential Good   Clinical Impairments Affecting Rehab Potential Osteopenia, chronicity of pain, scoliosis   Ford Frequency 3x / week   Ford Duration 8 weeks   Ford Treatment/Interventions Moist Heat;Therapeutic exercise;Therapeutic activities;Gait training;Stair training;Balance training;Passive range of motion;Patient/family education;Functional mobility training   Ford Next Visit Plan Continue with abdominal progression, progress HEP activities. No need to add in new exercises at this time in clinic; [place]; AVOID PIRIFORMIS STRETCH.    Consulted and Agree with Plan of Care Patient      Patient will benefit from skilled therapeutic intervention in order to improve the following deficits and impairments:  Abnormal gait, Decreased range of motion, Impaired vision/preception, Decreased knowledge of precautions, Decreased balance, Decreased strength, Decreased mobility, Hypomobility, Impaired flexibility, Pain  Visit Diagnosis: Bilateral low back pain without sciatica  Pain in thoracic spine  Muscle weakness (generalized)  Abnormal posture     Problem List Patient Active Problem List   Diagnosis Date Noted  . Arthritis of right hip 12/16/2013  . Status post THR (total hip replacement) 12/16/2013    Doris Ford, DPT [facility] [address] [place], [place], [address] Phone: [phone]   Fax:  [phone]  Name: Doris Ford MRN: [id number] Date of Birth: [birth year]

## 2016-02-20 NOTE — Patient Instructions (Signed)
While sitting on your couch or the side of your bed, lean to the side so that you are putting your weight on your elbow; then, trying to use your arm as little as possible to help you, use your core muscles to pull yourself back upright.   Repeat 5-10 times each side, 1-2 times per day.

## 2016-02-22 ENCOUNTER — Ambulatory Visit (HOSPITAL_COMMUNITY): Payer: Medicare HMO | Admitting: Physical Therapy

## 2016-02-22 DIAGNOSIS — M545 Low back pain, unspecified: Secondary | ICD-10-CM

## 2016-02-22 DIAGNOSIS — M546 Pain in thoracic spine: Secondary | ICD-10-CM

## 2016-02-22 DIAGNOSIS — M6281 Muscle weakness (generalized): Secondary | ICD-10-CM

## 2016-02-22 DIAGNOSIS — R293 Abnormal posture: Secondary | ICD-10-CM

## 2016-02-22 NOTE — Therapy (Signed)
[place], [place], [address] Phone: [phone]   Fax:  [phone]  Physical Therapy Treatment  Patient Details  Name: ABBRIELLE RENTAS MRN: [id number] Date of Birth: 1941-05-03 Referring Provider: Benita Stabile   Encounter Date: 02/22/2016      PT End of Session - 02/22/16 1205    Visit Number 7   Number of Visits 24   Date for PT Re-Evaluation 03/08/16   Authorization Type [employer] Medicare    Authorization Time Period 02/06/16-04/08/16   Authorization - Visit Number 7   Authorization - Number of Visits 10   PT Start Time 1118   PT Stop Time 1158   PT Time Calculation (min) 40 min   Activity Tolerance Patient tolerated treatment well   Behavior During Therapy [facility] for tasks assessed/performed      Past Medical History:  Diagnosis Date  . Arthritis   . Cancer ([place]) 12/07   right breast/tx with lumpectomy and tamoxifen  . Hypertension    under control  . Interstitial cystitis   . Osteopenia    no change    Past Surgical History:  Procedure Laterality Date  . BILATERAL SALPINGOOPHORECTOMY  age 59s  . BREAST SURGERY Right    lumpectomy-multiple times  . BUNIONECTOMY WITH HAMMERTOE RECONSTRUCTION Bilateral ~2000  . CATARACT EXTRACTION Bilateral 2007  . KNEE SURGERY Bilateral as teen  . TONSILLECTOMY AND ADENOIDECTOMY  age 4  . 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 TOTAL VAGINAL HYSTERECTOMY  age 54    There were no vitals filed for this visit.      Subjective Assessment - 02/22/16 1120    Subjective Pt reports she has been doing her original exercises at home every day. She feels she might be making some improvement as she doesn't have to take rest breaks quite as frequent as she used to.    Pertinent History R anterior THA 2015; bilat  cartilage debridement in knees in 1950's; history of injections in T spine about 3-4YA.    Currently in Pain? No/denies                         [facility] Adult PT Treatment/Exercise - 02/22/16 0001      Lumbar Exercises: Seated   Other Seated Lumbar Exercises sit to stand with UE support, therapist tactile cues to prevent knee valgus deviation, x15 reps, 1 rest break at 10 reps      Lumbar Exercises: Supine   Ab Set 10 reps   AB Set Limitations with bent knee fall out   tactile cues to prevent trunk rotation   Heel Slides 10 reps   Bent Knee Raise 20 reps   Bent Knee Raise Limitations with ab set; tactile cues to improve activation of TrA   Other Supine Lumbar Exercises hip abd with red TB, 2x15                PT Education - 02/22/16 1142    Education provided Yes   Education Details importance of deep abdominal endurance for carry over into daily activity; technique with therex   Person(s) Educated Patient   Methods Explanation;Tactile cues   Comprehension Verbalized understanding;Need further instruction          PT Short Term Goals - 02/06/16 1252  PT SHORT TERM GOAL #3   Title Pt will improve 5x STS time by 50% by the 4th week of therapy to demonstrate improved indep in functional mobility and to decrease risk of falls in the home.            PT Long Term Goals - 02/06/16 1252      PT LONG TERM GOAL #3   Title Pt will demonstrate improved SLS time to >20s bilat and 5x STS time to <12s to demonstrate improved strength for funcitonal mobility at home.    Status New               Plan - 02/22/16 1133    Clinical Impression Statement Pt is making progress towards her goals with reported increased activity tolerance. She does continue to require verbal/tactile cues for correct TrA activation during supine exercises. This does appear to have improved after several sessions, because she requires less frequent cues and is able to  self-correct at times. Will continue with current POC.    Rehab Potential Good   Clinical Impairments Affecting Rehab Potential Osteopenia, chronicity of pain, scoliosis   PT Frequency 3x / week   PT Duration 8 weeks   PT Treatment/Interventions Moist Heat;Therapeutic exercise;Therapeutic activities;Gait training;Stair training;Balance training;Passive range of motion;Patient/family education;Functional mobility training   PT Next Visit Plan Continue with abdominal progression, progress HEP activities; AVOID FOCUS ON SIJ ROTATION CORRECTION; AVOID PIRIFORMIS STRETCH.    PT Home Exercise Plan no updates this visit   Consulted and Agree with Plan of Care Patient      Patient will benefit from skilled therapeutic intervention in order to improve the following deficits and impairments:  Abnormal gait, Decreased range of motion, Impaired vision/preception, Decreased knowledge of precautions, Decreased balance, Decreased strength, Decreased mobility, Hypomobility, Impaired flexibility, Pain  Visit Diagnosis: Bilateral low back pain without sciatica  Pain in thoracic spine  Muscle weakness (generalized)  Abnormal posture     Problem List Patient Active Problem List   Diagnosis Date Noted  . Arthritis of right hip 12/16/2013  . Status post THR (total hip replacement) 12/16/2013   12:08 PM,02/22/16 Elly Modena PT, DPT [facility] [address] [place], [place], [address] Phone: [phone]   Fax:  [phone]  Name: BREEONA RISDEN MRN: [id number] Date of Birth: 08/21/1940

## 2016-02-27 ENCOUNTER — Encounter (HOSPITAL_COMMUNITY): Payer: Self-pay

## 2016-02-27 ENCOUNTER — Ambulatory Visit (HOSPITAL_COMMUNITY): Payer: Medicare HMO | Attending: Physician Assistant

## 2016-02-27 DIAGNOSIS — M546 Pain in thoracic spine: Secondary | ICD-10-CM | POA: Diagnosis not present

## 2016-02-27 DIAGNOSIS — M6281 Muscle weakness (generalized): Secondary | ICD-10-CM | POA: Insufficient documentation

## 2016-02-27 DIAGNOSIS — R293 Abnormal posture: Secondary | ICD-10-CM | POA: Diagnosis not present

## 2016-02-27 DIAGNOSIS — M545 Low back pain, unspecified: Secondary | ICD-10-CM

## 2016-02-27 NOTE — Therapy (Signed)
[place], [place], [address] Phone: [phone]   Fax:  [phone]  Physical Therapy Treatment  Patient Details  Name: Doris Ford MRN: [id number] Date of Birth: [birth year] Referring Provider: Benita Stabile   Encounter Date: 02/27/2016      PT End of Session - 02/27/16 1342    Visit Number 8   Number of Visits 24   Date for PT Re-Evaluation 03/08/16   Authorization Type [employer] Medicare    Authorization Time Period 02/06/16-04/08/16   Authorization - Visit Number 8   Authorization - Number of Visits 10   PT Ford Time 1301   PT Stop Time 1345   PT Time Calculation (min) 44 min   Activity Tolerance Patient tolerated treatment well   Behavior During Therapy [name] for tasks assessed/performed      Past Medical History:  Diagnosis Date  . Arthritis   . Cancer ([name]) 12/07   right breast/tx with lumpectomy and tamoxifen  . Hypertension    under control  . Interstitial cystitis   . Osteopenia    no change    Past Surgical History:  Procedure Laterality Date  . BILATERAL SALPINGOOPHORECTOMY  age 44s  . BREAST SURGERY Right    lumpectomy-multiple times  . BUNIONECTOMY WITH HAMMERTOE RECONSTRUCTION Bilateral ~2000  . CATARACT EXTRACTION Bilateral 2007  . KNEE SURGERY Bilateral as teen  . TONSILLECTOMY AND ADENOIDECTOMY  age 7  . 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 TOTAL VAGINAL HYSTERECTOMY  age 63    There were no vitals filed for this visit.      Subjective Assessment - 02/27/16 1306    Subjective Pt reports she has been working on activities at home. She thinks that she is better able to perform household activities without needed to rest, but prolonged time on feet really aggravated her back last week.    Pertinent History R anterior  THA 2015; bilat cartilage debridement in knees in 1950's; history of injections in T spine about 3-4YA.    Limitations Standing;House hold activities   Currently in Pain? Yes   Pain Score 3    Pain Location Back   Pain Orientation Right   Pain Descriptors / Indicators Aching;Throbbing                         [name] - 02/27/16 0001      Lumbar Exercises: Seated   Other Seated Lumbar Exercises sitting RDL, wide stance 2x15 volleyball to cone  sitting clam bands 2x15 redTB     Lumbar Exercises: Supine   AB Set Limitations with bent knee fall out, feet elevated   tactile cues for core stab., 1x10 bilat   Clam --  *see above   Other Supine Lumbar Exercises Deadbug (legs only)   2x10 alter     Lumbar Exercises: Quadruped   Straight Leg Raise Other (comment);Limitations  2x8 bilat, VC for core stabilization                  PT Short Term Goals - 02/06/16 1252      PT SHORT TERM GOAL #3   Title Pt will improve 5x STS time by 50% by the 4th week of therapy to demonstrate improved indep in functional mobility and to decrease  risk of falls in the home.            PT Long Term Goals - 02/06/16 1252      PT LONG TERM GOAL #3   Title Pt will demonstrate improved SLS time to >20s bilat and 5x STS time to <12s to demonstrate improved strength for funcitonal mobility at home.    Status New               Plan - 02/27/16 1344    Clinical Impression Statement Pt making progress toward goals. STS remains difficult, limited by weakness in hip extensors and lumbar spine extensors. Task was brioken down into 2 new activieis to target these areas. Return to STS thereafter demonstrated imrpoved understanding of componenets, but fatgiue quickly became a limiting factor. Heavy focus on dynamic stabilization thsi session, with growing proficiency in control, able to progress many activities, and now include quadruped exercise.    Rehab  Potential Good   Clinical Impairments Affecting Rehab Potential Osteopenia, chronicity of pain, scoliosis   PT Frequency 3x / week   PT Duration 8 weeks   PT Treatment/Interventions Moist Heat;Therapeutic exercise;Therapeutic activities;Gait training;Stair training;Balance training;Passive range of motion;Patient/family education;Functional mobility training   PT Next Visit Plan Continue with abdominal progression, progress HEP activities; AVOID FOCUS ON SIJ ROTATION CORRECTION; AVOID PIRIFORMIS STRETCH.    PT Home Exercise Plan no updates this visit   Consulted and Agree with Plan of Care Patient      Patient will benefit from skilled therapeutic intervention in order to improve the following deficits and impairments:  Abnormal gait, Decreased range of motion, Impaired vision/preception, Decreased knowledge of precautions, Decreased balance, Decreased strength, Decreased mobility, Hypomobility, Impaired flexibility, Pain  Visit Diagnosis: Bilateral low back pain without sciatica  Pain in thoracic spine  Muscle weakness (generalized)  Abnormal posture     Problem List Patient Active Problem List   Diagnosis Date Noted  . Arthritis of right hip 12/16/2013  . Status post THR (total hip replacement) 12/16/2013    1:49 PM, 02/27/16 Etta Grandchild, PT, DPT Physical Therapist at [place] [phone] (office)     [place] [address] [place], [place], [address] Phone: [phone]   Fax:  [phone]  Name: Doris Ford MRN: [id number] Date of Birth: 10-15-1940

## 2016-02-27 NOTE — Patient Instructions (Signed)
-  Stick to [employer] on paper only. Please do not try to do all exercises done in PT session at home daily.

## 2016-02-29 ENCOUNTER — Ambulatory Visit (HOSPITAL_COMMUNITY): Payer: Medicare HMO

## 2016-02-29 DIAGNOSIS — M545 Low back pain, unspecified: Secondary | ICD-10-CM

## 2016-02-29 DIAGNOSIS — M546 Pain in thoracic spine: Secondary | ICD-10-CM

## 2016-02-29 DIAGNOSIS — M6281 Muscle weakness (generalized): Secondary | ICD-10-CM

## 2016-02-29 DIAGNOSIS — R293 Abnormal posture: Secondary | ICD-10-CM

## 2016-02-29 NOTE — Therapy (Signed)
[place], [place], [address] Phone: [phone]   Fax:  [phone]  Physical Therapy Treatment  Patient Details  Name: Doris Ford MRN: [id number] Date of Birth: 10-Apr-1941 Referring Provider: Benita Stabile   Encounter Date: 02/29/2016      PT End of Session - 02/29/16 1438    Visit Number 9   Number of Visits 24   Date for PT Re-Evaluation 03/08/16   Authorization Type [employer]na Medicare    Authorization Time Period 03/03/16-04-May-2016 (G-Codes done visit 2022-11-30)    Authorization - Visit Number 9   Authorization - Number of Visits 19   PT Start Time 1302   PT Stop Time 1350   PT Time Calculation (min) 48 min   Activity Tolerance Patient tolerated treatment well   Behavior During Therapy [facility] for tasks assessed/performed      Past Medical History:  Diagnosis Date  . Arthritis   . Cancer ([place]) 12/07   right breast/tx with lumpectomy and tamoxifen  . Hypertension    under control  . Interstitial cystitis   . Osteopenia    no change    Past Surgical History:  Procedure Laterality Date  . BILATERAL SALPINGOOPHORECTOMY  age 26s  . BREAST SURGERY Right    lumpectomy-multiple times  . BUNIONECTOMY WITH HAMMERTOE RECONSTRUCTION Bilateral ~2000  . CATARACT EXTRACTION Bilateral 2007  . KNEE SURGERY Bilateral as teen  . TONSILLECTOMY AND ADENOIDECTOMY  age 3  . 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 TOTAL VAGINAL HYSTERECTOMY  age 48    There were no vitals filed for this visit.      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 She is a little sore in paraspinals since new exercises last session.    Pertinent History R  anterior THA 2015; bilat cartilage debridement in knees in 1950's; history of injections in T spine about 3-4YA.    Currently in Pain? Yes   Pain Score 4    Pain Location Pelvis  posterio ilium, posterior proximal femur   Pain Orientation Right   Pain Type Chronic pain                         [name] - 02/29/16 0001      Lumbar Exercises: Seated   Sit to Stand 5 reps  3x5   Other Seated Lumbar Exercises sitting RDL, wide stance 2x15 yellow ball to cone  sitting clam bands 2x15 redTB     Lumbar Exercises: Prone   Other Prone Lumbar Exercises Prone: AAROM R hip 1x10 ER from 75* IR to 0*     Lumbar Exercises: Quadruped   Straight Leg Raise Other (comment);Limitations  2x8 bilat      Manual Therapy   Myofascial Release to Rt glute in prone   5 minutes                  PT Short Term Goals - March 03, 2016 1252      PT SHORT TERM GOAL #3   Title Pt will improve 5x STS time by 50% by the 4th week of therapy to demonstrate improved indep in functional mobility  and to decrease risk of falls in the home.            PT Long Term Goals - 02/06/16 1252      PT LONG TERM GOAL #3   Title Pt will demonstrate improved SLS time to >20s bilat and 5x STS time to <12s to demonstrate improved strength for funcitonal mobility at home.    Status New               Plan - 03-05-2016 1442    Clinical Impression Statement Pt reports she continues to have lots of pain in deep posterior hip stabilizers on R. She tolerates session well but continues to have pain with maximal loading. Hip extensors and external rotators remain all very weak, but form and attention to kinestheitcs are maintained well throughout. Surface height is increased for chair squats but still requirte minA once correct form is assumed. Palpation of posterior gluteal muscles are taught, firm, and very tender.    Rehab Potential Good   Clinical Impairments Affecting Rehab  Potential Osteopenia, chronicity of pain, scoliosis   PT Frequency 3x / week   PT Duration 8 weeks   PT Treatment/Interventions Moist Heat;Therapeutic exercise;Therapeutic activities;Gait training;Stair training;Balance training;Passive range of motion;Patient/family education;Functional mobility training   PT Next Visit Plan repeat MFR to R posterior glute max/med, prone AAROM hip ER starting at end range IR; AVOID FOCUS ON SIJ ROTATION CORRECTION; AVOID PIRIFORMIS STRETCH.    Consulted and Agree with Plan of Care Patient      Patient will benefit from skilled therapeutic intervention in order to improve the following deficits and impairments:  Abnormal gait, Decreased range of motion, Impaired vision/preception, Decreased knowledge of precautions, Decreased balance, Decreased strength, Decreased mobility, Hypomobility, Impaired flexibility, Pain  Visit Diagnosis: Bilateral low back pain without sciatica  Pain in thoracic spine  Muscle weakness (generalized)  Abnormal posture       G-Codes - Mar 05, 2016 1447    Functional Assessment Tool Used Clinical Judgment    Functional Limitation Mobility: Walking and moving around   Mobility: Walking and Moving Around Current Status [phone]) At least 20 percent but less than 40 percent impaired, limited or restricted   Mobility: Walking and Moving Around Goal Status [phone]) At least 1 percent but less than 20 percent impaired, limited or restricted      Problem List Patient Active Problem List   Diagnosis Date Noted  . Arthritis of right hip 12/16/2013  . Status post THR (total hip replacement) 12/16/2013    2:49 PM, 03-05-16 Etta Grandchild, PT, DPT Physical Therapist at [place] [phone] (office)     [place] [address] [place], [place], [address] Phone: [phone]   Fax:  [phone]  Name: Doris Ford MRN: [id number] Date of Birth:  13-Dec-1940

## 2016-03-05 ENCOUNTER — Ambulatory Visit (HOSPITAL_COMMUNITY): Payer: Medicare HMO

## 2016-03-05 DIAGNOSIS — M545 Low back pain, unspecified: Secondary | ICD-10-CM

## 2016-03-05 DIAGNOSIS — R293 Abnormal posture: Secondary | ICD-10-CM

## 2016-03-05 DIAGNOSIS — M6281 Muscle weakness (generalized): Secondary | ICD-10-CM

## 2016-03-05 DIAGNOSIS — M546 Pain in thoracic spine: Secondary | ICD-10-CM

## 2016-03-05 NOTE — Patient Instructions (Addendum)
  SEATED HAMSTRING STRETCH  While seated, rest your heel on the floor with your knee straight and gently lean forward until a stretch is felt behind your knee/thigh.   2 x 30 seconds each, 2x's/day   Quadratus lumborum stretch at wall  Stand sideways to wall with feet 12 to 24 inches from wall.  Position so the hip is leaning/resting on wall.  Then reach arm overhead and bend sideways away from the wall to feel a stretch in teh side of the trunk and into the back.  For additional stretch, slightly bend forward at the hips (unhook the hips) and reach to the side and slight forward.  2x30 seconds each, 2x's/day

## 2016-03-05 NOTE — Therapy (Addendum)
[place], [place], [address] Phone: [phone]   Fax:  [phone]  Physical Therapy Re-assessment  Patient Details  Name: Doris Ford MRN: [id number] Date of Birth: Jun 10, 1941 Referring Provider: Benita Stabile  Encounter Date: 03/05/2016      PT End of Session - 03/05/16 1631    Visit Number 10   Number of Visits 24   Date for PT Re-Evaluation 03-May-2016  Re-evaluation completed 03/05/2016   Authorization Type [employer] Medicare    Authorization Time Period 2016-03-02-03-May-2016 (G-Codes done visit 09-30-22)    Authorization - Visit Number 9   Authorization - Number of Visits 19   PT Start Time 7782   PT Stop Time 1348   PT Time Calculation (min) 45 min   Activity Tolerance Patient tolerated treatment well   Behavior During Therapy Doris Ford for tasks assessed/performed      Past Medical History:  Diagnosis Date  . Arthritis   . Cancer ([place]) 12/07   right breast/tx with lumpectomy and tamoxifen  . Hypertension    under control  . Interstitial cystitis   . Osteopenia    no change    Past Surgical History:  Procedure Laterality Date  . BILATERAL SALPINGOOPHORECTOMY  age 6s  . BREAST SURGERY Right    lumpectomy-multiple times  . BUNIONECTOMY WITH HAMMERTOE RECONSTRUCTION Bilateral ~2000  . CATARACT EXTRACTION Bilateral 2007  . KNEE SURGERY Bilateral as teen  . TONSILLECTOMY AND ADENOIDECTOMY  age 72  . 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 TOTAL VAGINAL HYSTERECTOMY  age 74    There were no vitals filed for this visit.      Subjective Assessment - 03/05/16 1304    Subjective Pt expressed that she still has the original Ache across her lower back and into the left back.  Pt states she may be able to do a little bit more before she has to sit down, but  not a whole lot.  Has been out running errands this morning, and now feels like she needs to recline and relax.  Still doing exercises every day.  Pt was really sore Sat & Sun after doing some exercises at therapy.    Pertinent History R anterior THA 2015; bilat cartilage debridement in knees in 1950's; history of injections in T spine about 3-4YA.    Limitations Standing;House hold activities   How long can you sit comfortably? about 1 hour (church)    How long can you stand comfortably? 15-20 minutes (kitchen activity/cutting a watermelon)    How long can you walk comfortably? 30-45 minutes (grocery shopping, but without limtting)    Diagnostic tests XRAY    Patient Stated Goals improve funcitonal tolerance with less pain.    Currently in Pain? Yes   Pain Score 4    Pain Location Sacrum   Pain Orientation Right;Left;Lower;Medial   Pain Descriptors / Indicators Aching   Pain Type Chronic pain   Pain Radiating Towards across the SI area and up into the L thoracic region.    Aggravating Factors  prolonged standing, prolonged sitting erect, repetative bending/liftting.    Pain Relieving Factors lying down, or sitting in a recliner, heat does not seem to work.    Effect of Pain on Daily Activities Has to take additional rest breaks.    Multiple Pain Sites  No            [facility] PT Assessment - 03/05/16 0001      Assessment   Medical Diagnosis Bilat LBP; Left lateral mid throacic pain.    Referring Provider Benita Stabile   Hand Dominance Right   Next MD Visit None scheduled    Prior Therapy For THA only 2YA     Precautions   Precautions None   Precaution Comments BMD done multiple times, ISQ     Strength   Right Hip Flexion 4/5   Right Hip Extension 3/5   Right Hip External Rotation  3/5   Right Hip Internal Rotation 5/5   Right Hip ABduction 3/5   Left Hip Flexion 4-/5   Left Hip Extension 3/5   Left Hip External Rotation 3/5   Left Hip Internal Rotation 5/5   Left Hip ABduction  3/5   Right Knee Extension 5/5   Left Knee Extension 4+/5   Right Ankle Dorsiflexion 5/5   Left Ankle Dorsiflexion 5/5     Transfers   Five time sit to stand comments  17.55  without using UE's.      Ambulation/Gait   Ambulation Distance (Feet) 791 Feet     High Level Balance   High Level Balance Comments B SLS = 20 seconds each side.                       [facility] Adult PT Treatment/Exercise - 03/05/16 0001      Lumbar Exercises: Stretches   Active Hamstring Stretch 2 reps;30 seconds;Other (comment)  bilaterally, sitting in the chair.    Standing Side Bend 2 reps;30 seconds  bilaterally   Piriformis Stretch --     Manual Therapy   Manual Therapy Myofascial release;Soft tissue mobilization;Other (comment)   Soft tissue mobilization to Rt glute in prone, as well as L thoracolumbar paraspinals.                 PT Education - 03/05/16 1630    Education provided Yes   Education Details posture and muscular imbalances along with importance of stretching.    Person(s) Educated Patient   Methods Explanation;Demonstration;Tactile cues;Handout   Comprehension Verbalized understanding;Need further instruction          PT Short Term Goals - 03/05/16 1345      PT SHORT TERM GOAL #1   Title Pt will demonstrate independence in beginning home exercise program by twos weeks after commencement of therapy, to affirm self-efficacy in work at home to making progress toward goals.   Status Achieved     PT SHORT TERM GOAL #2   Baseline Able to ambulate 710 ft, but continues to have pain.    Status Partially Met     PT SHORT TERM GOAL #3   Title Pt will improve 5x STS time by 50% by the 4th week of therapy to demonstrate improved indep in functional mobility and to decrease risk of falls in the home.    Baseline 8/9: improved to 17.55seconds   Status Partially Met           PT Long Term Goals - 03/05/16 1347      PT LONG TERM GOAL #1   Title Pt will  demonstrate independence in advanced home exercise program by 1 week prior to discharge, to further self-efficacy in continuation of progress toward goals after discharge from therapy.    Status Partially Met     PT LONG  TERM GOAL #2   Title Pt will ambulate 1074f s AD and s exacerbation of symptoms after 8 weeks, to demonstrate improved activity tolerance and improved independence in community ambulation and IADL.    Status On-going     PT LONG TERM GOAL #3   Title Pt will demonstrate improved SLS time to >20s bilat and 5x STS time to <12s to demonstrate improved strength for funcitonal mobility at home.    Baseline 8August 14, 2024 20 seconds for SLS on B LE's   Status Achieved               Plan - 008/14/20171633    Clinical Impression Statement Re-assessment performed today.  Pt continues to express increased pain that starts along the R hip and radiates over to the left thoracolumbar region.  She demonstrates improvement with 3MWT, and was able to ambulate 7928ftoday which is nearly double the distance she went on initial evaluation.  Pt was also able to improve slightly with her 5 time sit<>stand to 17.55seconds - but was not using her UE's to perform the task. LE strength seems to remain around the same.  Pt would benefit from continued PT to progress endurance strength, continue with posture education, as well as pain management.     Rehab Potential Good   Clinical Impairments Affecting Rehab Potential Osteopenia, chronicity of pain, scoliosis   PT Frequency 3x / week   PT Duration 8 weeks   PT Treatment/Interventions Moist Heat;Therapeutic exercise;Therapeutic activities;Gait training;Stair training;Balance training;Passive range of motion;Patient/family education;Functional mobility training   PT Next Visit Plan AVOID FOCUS ON SIJ ROTATION CORRECTION, AVOID PIRIFORMIS STRETCH, Assess effectiveness of MFR, and continue with functional strengthening and stretching.    PT Home Exercise Plan  Added hamstring stretch, and standing sidebending stretch.    Consulted and Agree with Plan of Care Patient      Patient will benefit from skilled therapeutic intervention in order to improve the following deficits and impairments:  Abnormal gait, Decreased range of motion, Impaired vision/preception, Decreased knowledge of precautions, Decreased balance, Decreased strength, Decreased mobility, Hypomobility, Impaired flexibility, Pain  Visit Diagnosis: Bilateral low back pain without sciatica  Pain in thoracic spine  Muscle weakness (generalized)  Abnormal posture       G-Codes - [id number]    Functional Assessment Tool Used Clinical Judgment    Functional Limitation Mobility: Walking and moving around   Mobility: Walking and Moving Around Current Status ([phone] least 20 percent but less than 40 percent impaired, limited or restricted   Mobility: Walking and Moving Around Goal Status ([phone] least 1 percent but less than 20 percent impaired, limited or restricted      Problem List Patient Active Problem List   Diagnosis Date Noted  . Arthritis of right hip 12/16/2013  . Status post THR (total hip replacement) 12/16/2013    Doris Ford 8/14-Aug-201711:43 PM  Doris Ford

## 2016-03-07 ENCOUNTER — Ambulatory Visit (HOSPITAL_COMMUNITY): Payer: Medicare HMO

## 2016-03-07 ENCOUNTER — Encounter (HOSPITAL_COMMUNITY): Payer: Medicare HMO

## 2016-03-07 DIAGNOSIS — M545 Low back pain, unspecified: Secondary | ICD-10-CM

## 2016-03-07 DIAGNOSIS — R293 Abnormal posture: Secondary | ICD-10-CM

## 2016-03-07 DIAGNOSIS — M546 Pain in thoracic spine: Secondary | ICD-10-CM

## 2016-03-07 DIAGNOSIS — M6281 Muscle weakness (generalized): Secondary | ICD-10-CM

## 2016-03-07 NOTE — Therapy (Signed)
[place], [place], [address] Phone: [phone]   Fax:  [phone]  Physical Therapy Treatment  Patient Details  Name: Doris Ford MRN: [id number] Date of Birth: July 07, 1941 Referring Provider: Benita Stabile  Encounter Date: 03/07/2016      PT End of Session - 03/07/16 1210    Visit Number 11   Number of Visits 24   Date for PT Re-Evaluation 2016/04/16   Authorization Type A[employer]na Medicare    Authorization Time Period 02-14-16-16-Apr-2016 (G-Codes done visit Oct 12, 2022)    Authorization - Visit Number 11   Authorization - Number of Visits 19   PT Start Time 1115   PT Stop Time 1200   PT Time Calculation (min) 45 min   Activity Tolerance Patient tolerated treatment well   Behavior During Therapy [facility] for tasks assessed/performed      Past Medical History:  Diagnosis Date  . Arthritis   . Cancer ([place]) 12/07   right breast/tx with lumpectomy and tamoxifen  . Hypertension    under control  . Interstitial cystitis   . Osteopenia    no change    Past Surgical History:  Procedure Laterality Date  . BILATERAL SALPINGOOPHORECTOMY  age 75  . BREAST SURGERY Right    lumpectomy-multiple times  . BUNIONECTOMY WITH HAMMERTOE RECONSTRUCTION Bilateral ~2000  . CATARACT EXTRACTION Bilateral 2007  . KNEE SURGERY Bilateral as teen  . TONSILLECTOMY AND ADENOIDECTOMY  age 75  . 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 TOTAL VAGINAL HYSTERECTOMY  age 75    There were no vitals filed for this visit.      Subjective Assessment - 03/07/16 1118    Subjective Pt stated her lower back Rt side feels okay today, hasn't done a lot this morning, pain scale 2/10.  Reports she has increased pain with standing functional activities at home.  Reports complaince with new HEP without  questions.   Pertinent History R anterior THA 2015; bilat cartilage debridement in knees in 1950's; history of injections in T spine about 3-4YA.    Patient Stated Goals improve funcitonal tolerance with less pain.    Currently in Pain? Yes   Pain Score 2    Pain Location Back   Pain Orientation Lower;Right   Pain Descriptors / Indicators Aching;Sore   Pain Type Chronic pain   Pain Radiating Towards across the SI area and up into the Lt thoracic region   Pain Onset Other (comment)   Pain Frequency Constant   Aggravating Factors  prolonged standing, prolonged sitting erect, repetative bending/lifting   Pain Relieving Factors lying down or sitting in a recliner, heat does not sem to work   Effect of Pain on Daily Activities Has to take additional rest breaks                         [facility] Adult PT Treatment/Exercise - 03/07/16 0001      Lumbar Exercises: Stretches   Active Hamstring Stretch 30 seconds;3 reps   Active Hamstring Stretch Limitations supine with rope   Standing Side Bend Limitations Lt UE overhead in sitting for sidebending 10     Lumbar Exercises: Seated   Other Seated Lumbar Exercises Rt sidebending with UE over head, Lt row RTB 10x     Lumbar Exercises: Supine  Ab Set 10 reps   AB Set Limitations with bent knee fall out, feet elevated    Bridge 15 reps     Lumbar Exercises: Quadruped   Straight Leg Raise 10 reps;3 seconds     Manual Therapy   Manual Therapy Myofascial release;Soft tissue mobilization;Other (comment)   Soft tissue mobilization to Rt glute in prone, as well as L thoracolumbar paraspinals.    Myofascial Release to Rt glute in prone                   PT Short Term Goals - 03/05/16 1345      PT SHORT TERM GOAL #1   Title Pt will demonstrate independence in beginning home exercise program by twos weeks after commencement of therapy, to affirm self-efficacy in work at home to making progress toward goals.   Status  Achieved     PT SHORT TERM GOAL #2   Baseline Able to ambulate 710 ft, but continues to have pain.    Status Partially Met     PT SHORT TERM GOAL #3   Title Pt will improve 5x STS time by 50% by the 4th week of therapy to demonstrate improved indep in functional mobility and to decrease risk of falls in the home.    Baseline 8/9: improved to 17.55seconds   Status Partially Met           PT Long Term Goals - 03/05/16 1347      PT LONG TERM GOAL #1   Title Pt will demonstrate independence in advanced home exercise program by 1 week prior to discharge, to further self-efficacy in continuation of progress toward goals after discharge from therapy.    Status Partially Met     PT LONG TERM GOAL #2   Title Pt will ambulate 1092f s AD and s exacerbation of symptoms after 8 weeks, to demonstrate improved activity tolerance and improved independence in community ambulation and IADL.    Status On-going     PT LONG TERM GOAL #3   Title Pt will demonstrate improved SLS time to >20s bilat and 5x STS time to <12s to demonstrate improved strength for funcitonal mobility at home.    Baseline 8/9: 20 seconds for SLS on B LE's   Status Achieved               Plan - 03/07/16 1211    Clinical Impression Statement Session focus on core and proximal musculature strengthening and functional stretching.  Pt able to complete all exercises with therapist facilitation for proper form and technique for maximal benefits.  Ended session with manual MFR to Rt gluteal and Lt thoracolumbar region.  Pt reported tenderness along insertion poin Rt piriformis and noted fascial restrions with thoracolmubar/ QL region.  No reports of increased pain through session.     Rehab Potential Good   Clinical Impairments Affecting Rehab Potential Osteopenia, chronicity of pain, scoliosis   PT Frequency 3x / week   PT Duration 8 weeks   PT Treatment/Interventions Moist Heat;Therapeutic exercise;Therapeutic  activities;Gait training;Stair training;Balance training;Passive range of motion;Patient/family education;Functional mobility training   PT Next Visit Plan AVOID FOCUS ON SIJ ROTATION CORRECTION, AVOID PIRIFORMIS STRETCH, Assess effectiveness of MFR, and continue with functional strengthening and stretching.       Patient will benefit from skilled therapeutic intervention in order to improve the following deficits and impairments:  Abnormal gait, Decreased range of motion, Impaired vision/preception, Decreased knowledge of precautions, Decreased balance, Decreased strength, Decreased mobility,  Hypomobility, Impaired flexibility, Pain  Visit Diagnosis: Bilateral low back pain without sciatica  Pain in thoracic spine  Muscle weakness (generalized)  Abnormal posture     Problem List Patient Active Problem List   Diagnosis Date Noted  . Arthritis of right hip 12/16/2013  . Status post THR (total hip replacement) 12/16/2013   Ihor Austin, [place]; CBIS [phone]  Aldona Lento 03/07/2016, 12:17 PM  [place], [place], [address] Phone: [phone]   Fax:  [phone]  Name: GENESIS NOVOSAD MRN: [id number] Date of Birth: [birth year]

## 2016-03-12 ENCOUNTER — Ambulatory Visit (HOSPITAL_COMMUNITY): Payer: Medicare HMO

## 2016-03-12 DIAGNOSIS — M545 Low back pain, unspecified: Secondary | ICD-10-CM

## 2016-03-12 DIAGNOSIS — M546 Pain in thoracic spine: Secondary | ICD-10-CM

## 2016-03-12 DIAGNOSIS — R293 Abnormal posture: Secondary | ICD-10-CM

## 2016-03-12 DIAGNOSIS — M6281 Muscle weakness (generalized): Secondary | ICD-10-CM

## 2016-03-12 NOTE — Therapy (Signed)
[place] [facility] [address] [place], [place], [address] Phone: [phone]   Fax:  [phone]  Physical Therapy Treatment  Patient Details  Name: Doris Ford MRN: [id number] Date of Birth: 06/09/1941 Referring Provider: Rexene Edison  Encounter Date: 03/12/2016      PT End of Session - 03/12/16 1402    Visit Number 12   Number of Visits 24   Date for PT Re-Evaluation 2016-04-28   Authorization Type A[employer]na Medicare    Authorization Time Period 02/26/2016-04-28-2016 (G-Codes done visit 9)    Authorization - Visit Number 12   Authorization - Number of Visits 19   PT Start Time 1350   PT Stop Time 1433   PT Time Calculation (min) 43 min   Activity Tolerance Patient tolerated treatment well   Behavior During Therapy [facility] for tasks assessed/performed      Past Medical History:  Diagnosis Date  . Arthritis   . Cancer ([facility]) 12/07   right breast/tx with lumpectomy and tamoxifen  . Hypertension    under control  . Interstitial cystitis   . Osteopenia    no change    Past Surgical History:  Procedure Laterality Date  . BILATERAL SALPINGOOPHORECTOMY  age 3s  . BREAST SURGERY Right    lumpectomy-multiple times  . BUNIONECTOMY WITH HAMMERTOE RECONSTRUCTION Bilateral ~2000  . CATARACT EXTRACTION Bilateral 2007  . KNEE SURGERY Bilateral as teen  . TONSILLECTOMY AND ADENOIDECTOMY  age 17  . TOTAL HIP ARTHROPLASTY Right 12/16/2013   Procedure: RIGHT TOTAL HIP ARTHROPLASTY ANTERIOR APPROACH;  Surgeon: Kathryne Hitch, MD;  Location: [facility];  Service: Orthopedics;  Laterality: Right;  . TOTAL VAGINAL HYSTERECTOMY  age 6    There were no vitals filed for this visit.      Subjective Assessment - 03/12/16 1353    Subjective Pt stated she has been up and moving a lot today, becoming more aware of her posture while doing activities.  Current pain scale 3/10 lower back Rt sided.  Pt reports positive results following manual techniques last  session and the stretches completeing at home.    Pertinent History R anterior THA 2015; bilat cartilage debridement in knees in 1950's; history of injections in T spine about 3-4YA.    Patient Stated Goals improve funcitonal tolerance with less pain.    Currently in Pain? Yes   Pain Score 3    Pain Location Back   Pain Orientation Lower   Pain Descriptors / Indicators Aching;Sore   Pain Type Chronic pain   Pain Radiating Towards across the SI area and up into the Lt thoracic region   Pain Onset More than a month ago   Pain Frequency Constant   Aggravating Factors  prolonged standing, prolonged sitting erect, repetative bending/lifting   Pain Relieving Factors lying down of sitting in recliner, head does not seem to help   Effect of Pain on Daily Activities has to take additional rest breaks             [facility] Adult PT Treatment/Exercise - 03/12/16 0001      Lumbar Exercises: Stretches   Active Hamstring Stretch 30 seconds;3 reps   Active Hamstring Stretch Limitations seated with forward reach   Standing Side Bend 2 reps;30 seconds   ITB Stretch 3 reps;20 seconds   ITB Stretch Limitations standing beside wall with UE overhead st     Lumbar Exercises: Seated   Other Seated Lumbar Exercises RTB Bil UE rows, Rt punch  with RTB 10x     Lumbar Exercises: Quadruped   Single Arm Raise 10 reps;3 seconds   Straight Leg Raise 10 reps;3 seconds     Manual Therapy   Manual Therapy Myofascial release;Soft tissue mobilization;Other (comment)   Manual therapy comments completed seperately from all other skilled interventions   Soft tissue mobilization to Rt glute in prone, as well as L thoracolumbar paraspinals and erector spinae   Myofascial Release to Rt glute in prone                   PT Short Term Goals - 03/05/16 1345      PT SHORT TERM GOAL #1   Title Pt will demonstrate independence in beginning home exercise program by twos weeks after commencement of therapy, to  affirm self-efficacy in work at home to making progress toward goals.   Status Achieved     PT SHORT TERM GOAL #2   Baseline Able to ambulate 710 ft, but continues to have pain.    Status Partially Met     PT SHORT TERM GOAL #3   Title Pt will improve 5x STS time by 50% by the 4th week of therapy to demonstrate improved indep in functional mobility and to decrease risk of falls in the home.    Baseline 8/9: improved to 17.55seconds   Status Partially Met           PT Long Term Goals - 03/05/16 1347      PT LONG TERM GOAL #1   Title Pt will demonstrate independence in advanced home exercise program by 1 week prior to discharge, to further self-efficacy in continuation of progress toward goals after discharge from therapy.    Status Partially Met     PT LONG TERM GOAL #2   Title Pt will ambulate 1039f s AD and s exacerbation of symptoms after 8 weeks, to demonstrate improved activity tolerance and improved independence in community ambulation and IADL.    Status On-going     PT LONG TERM GOAL #3   Title Pt will demonstrate improved SLS time to >20s bilat and 5x STS time to <12s to demonstrate improved strength for funcitonal mobility at home.    Baseline 8/9: 20 seconds for SLS on B LE's   Status Achieved               Plan - 03/12/16 1624    Clinical Impression Statement Continued session focus on improving proximal musculature strengtheing and functional mobiltiy exercises.  Added quadruped SAR for postural strengthening.  Pt with reports of minimal pain this session more reports of stretching with therex.  Therapist facilitation for correct form and technqiue througtherex.  Ended session with myofascial technqies to Rt gluteal and Lt thoracolumbar region, noted increase fascial restrictions with Lt erector spinae as well this session.  No reports of increased pain through session.     Rehab Potential Good   Clinical Impairments Affecting Rehab Potential Osteopenia,  chronicity of pain, scoliosis   PT Frequency 3x / week   PT Duration 8 weeks   PT Treatment/Interventions Moist Heat;Therapeutic exercise;Therapeutic activities;Gait training;Stair training;Balance training;Passive range of motion;Patient/family education;Functional mobility training   PT Next Visit Plan AVOID FOCUS ON SIJ ROTATION CORRECTION, AVOID PIRIFORMIS STRETCH, Assess effectiveness of MFR, and continue with functional strengthening and stretching.       Patient will benefit from skilled therapeutic intervention in order to improve the following deficits and impairments:  Abnormal gait, Decreased range of motion,  Impaired vision/preception, Decreased knowledge of precautions, Decreased balance, Decreased strength, Decreased mobility, Hypomobility, Impaired flexibility, Pain  Visit Diagnosis: Bilateral low back pain without sciatica  Pain in thoracic spine  Muscle weakness (generalized)  Abnormal posture     Problem List Patient Active Problem List   Diagnosis Date Noted  . Arthritis of right hip 12/16/2013  . Status post THR (total hip replacement) 12/16/2013   Ihor Austin, LPTA; CBIS [phone]  Aldona Lento 03/12/2016, 4:30 PM  [place] [address] [place], [place], [address] Phone: [phone]   Fax:  [phone]  Name: Doris Ford MRN: [id number] Date of Birth: [birth year]

## 2016-03-14 ENCOUNTER — Ambulatory Visit (HOSPITAL_COMMUNITY): Payer: Medicare HMO

## 2016-03-14 DIAGNOSIS — M546 Pain in thoracic spine: Secondary | ICD-10-CM

## 2016-03-14 DIAGNOSIS — M545 Low back pain, unspecified: Secondary | ICD-10-CM

## 2016-03-14 DIAGNOSIS — R293 Abnormal posture: Secondary | ICD-10-CM

## 2016-03-14 DIAGNOSIS — M6281 Muscle weakness (generalized): Secondary | ICD-10-CM

## 2016-03-14 NOTE — Therapy (Signed)
[place], [place], [address] Phone: [phone]   Fax:  [phone]  Physical Therapy Treatment  Patient Details  Name: Doris Ford MRN: [id number] Date of Birth: [birth year] Referring Provider: Benita Stabile  Encounter Date: 03/14/2016      PT End of Session - 03/14/16 1555    Visit Number 13   Number of Visits 24   Date for PT Re-Evaluation 2016-04-29   Authorization Type [employer]    Authorization Time Period 02-27-2016-2016/04/29 (G-Codes done visit September 26, 2022)    Authorization - Visit Number 13   Authorization - Number of Visits 19   PT Start Time 1300   PT Stop Time 1352   PT Time Calculation (min) 52 min   Activity Tolerance Patient tolerated treatment well   Behavior During Therapy [facility] for tasks assessed/performed      Past Medical History:  Diagnosis Date  . Arthritis   . Cancer ([place]) 12/07   right breast/tx with lumpectomy and tamoxifen  . Hypertension    under control  . Interstitial cystitis   . Osteopenia    no change    Past Surgical History:  Procedure Laterality Date  . BILATERAL SALPINGOOPHORECTOMY  age 75  . BREAST SURGERY Right    lumpectomy-multiple times  . BUNIONECTOMY WITH HAMMERTOE RECONSTRUCTION Bilateral ~2000  . CATARACT EXTRACTION Bilateral 2007  . KNEE SURGERY Bilateral as 75  . TONSILLECTOMY AND ADENOIDECTOMY  age 75  . 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 TOTAL VAGINAL HYSTERECTOMY  age 75    There were no vitals filed for this visit.      Subjective Assessment - 03/14/16 1305    Subjective Pt reports she remains somewhat painful and symptomatic with increased standing. She has a hard time explaining how recent inclusion of manual therapy. She reports that compliance on HEP remains high, but none of these  help her pain.    Pertinent History R anterior THA 2015; bilat cartilage debridement in knees in 1950's; history of injections in T spine about 3-4YA.             [facility] PT Assessment - 03/14/16 0001      Palpation   SI assessment  ASIS to MM: OEV:03JK; K:93GH  Umbilicus to MM: WEX:93ZJ; L:89.5cm     Special Tests    Special Tests Sacrolliac Tests;Hip Special Tests   Sacroiliac Tests  Sacral Compression   Hip Special Tests  Saralyn Pilar [facility]) Test     Pelvic Dictraction   Findings Negative     Pelvic Compression   Findings Negative   comment Minimal effect on PSLR      Sacral thrust    Findings Positive   Side Right     Gaenslen's test   Findings Positive   Side  Right     Saralyn Pilar ([facility]) Test   Findings Positive   Side Right   Comments SIJ pain                      [name] - 03/14/16 0001      Lumbar Exercises: Supine   Bridge 10 reps;Other (comment)  2x10; bil concentric, RLE eccentric   Straight Leg Raise 15 reps  1x15 bil  PT Short Term Goals - 03/05/16 1345      PT SHORT TERM GOAL #1   Title Pt will demonstrate independence in beginning home exercise program by twos weeks after commencement of therapy, to affirm self-efficacy in work at home to making progress toward goals.   Status Achieved     PT SHORT TERM GOAL #2   Baseline Able to ambulate 710 ft, but continues to have pain.    Status Partially Met     PT SHORT TERM GOAL #3   Title Pt will improve 5x STS time by 50% by the 4th week of therapy to demonstrate improved indep in functional mobility and to decrease risk of falls in the home.    Baseline 8/9: improved to 17.55seconds   Status Partially Met           PT Long Term Goals - 03/05/16 1347      PT LONG TERM GOAL #1   Title Pt will demonstrate independence in advanced home exercise program by 1 week prior to discharge, to further self-efficacy in continuation of progress  toward goals after discharge from therapy.    Status Partially Met     PT LONG TERM GOAL #2   Title Pt will ambulate 108f s AD and s exacerbation of symptoms after 8 weeks, to demonstrate improved activity tolerance and improved independence in community ambulation and IADL.    Status On-going     PT LONG TERM GOAL #3   Title Pt will demonstrate improved SLS time to >20s bilat and 5x STS time to <12s to demonstrate improved strength for funcitonal mobility at home.    Baseline 8/9: 20 seconds for SLS on B LE's   Status Achieved               Plan - 03/14/16 1555    Clinical Impression Statement Pt tolerating session well today. PT taking extra time to investigate relationship between her low back pain and R posterior hip pain. Pt has multiple sign/symptoms commonly associated with R anterior innominate rotation, with multiple attempts to correct unsuccessful. Special testing of SI joint reveals only 2 positive of 5 tests, meaning a low liklihood hood ratio of SI joint involvement in pain. Pelvis is not level, and Leg length is noted to be longer on R side when assesed from both ASIS and umbilicus. I suspect this pelvic shift is more likely related to the patients known scoliosis, rather than any acute innominate rotation. LLD is common after THA and I suspect this may be related, but at this time all other tests/measures are unremarkable. Pt maintains very painful R glute max and piriformis with trigger points and taught bands. Will continue to utilize manual techniques to help improve muscle related pain in this area. Consider a heel lift to address the LLD next visit.    Rehab Potential Good   Clinical Impairments Affecting Rehab Potential Osteopenia, chronicity of pain, scoliosis   PT Frequency 3x / week   PT Duration 8 weeks   PT Treatment/Interventions Moist Heat;Therapeutic exercise;Therapeutic activities;Gait training;Stair training;Balance training;Passive range of  motion;Patient/family education;Functional mobility training   PT Next Visit Plan STM/MFR to R sided glute max/ piriformis; continue to work on functional strengthing of hip rotators bilat. AVOID FOCUS ON SIJ ROTATION CORRECTION;   Consulted and Agree with Plan of Care Patient      Patient will benefit from skilled therapeutic intervention in order to improve the following deficits and impairments:  Abnormal gait, Decreased range  of motion, Impaired vision/preception, Decreased knowledge of precautions, Decreased balance, Decreased strength, Decreased mobility, Hypomobility, Impaired flexibility, Pain  Visit Diagnosis: Bilateral low back pain without sciatica  Pain in thoracic spine  Muscle weakness (generalized)  Abnormal posture     Problem List Patient Active Problem List   Diagnosis Date Noted  . Arthritis of right hip 12/16/2013  . Status post THR (total hip replacement) 12/16/2013   4:13 PM, 03/14/16 Etta Grandchild, PT, DPT Physical Therapist at [place] [phone] (office)     [place] [address] [place], [place], [address] Phone: [phone]   Fax:  [phone]  Name: FAVOR KREH MRN: [id number] Date of Birth: Jul 04, 1941

## 2016-03-19 ENCOUNTER — Ambulatory Visit (HOSPITAL_COMMUNITY): Payer: Medicare HMO | Admitting: Physical Therapy

## 2016-03-19 DIAGNOSIS — M545 Low back pain, unspecified: Secondary | ICD-10-CM

## 2016-03-19 DIAGNOSIS — R293 Abnormal posture: Secondary | ICD-10-CM

## 2016-03-19 DIAGNOSIS — M546 Pain in thoracic spine: Secondary | ICD-10-CM

## 2016-03-19 DIAGNOSIS — M6281 Muscle weakness (generalized): Secondary | ICD-10-CM

## 2016-03-19 NOTE — Therapy (Signed)
[place], [place], [address] Phone: [phone]   Fax:  [phone]  Physical Therapy Treatment  Patient Details  Name: Doris Ford MRN: [id number] Date of Birth: Oct 17, 1940 Referring Provider: Benita Stabile  Encounter Date: 03/19/2016      PT End of Session - 03/19/16 1355    Visit Number 14   Number of Visits 24   Date for PT Re-Evaluation Apr 16, 2016   Authorization Type [employer] Medicare    Authorization Time Period 2016-02-14-2016-04-16 (G-Codes done visit September 13, 2022)    Authorization - Visit Number 14   Authorization - Number of Visits 19   PT Start Time 1302   PT Stop Time 1344   PT Time Calculation (min) 42 min   Activity Tolerance Patient tolerated treatment well   Behavior During Therapy [facility] for tasks assessed/performed      Past Medical History:  Diagnosis Date  . Arthritis   . Cancer ([place]) 12/07   right breast/tx with lumpectomy and tamoxifen  . Hypertension    under control  . Interstitial cystitis   . Osteopenia    no change    Past Surgical History:  Procedure Laterality Date  . BILATERAL SALPINGOOPHORECTOMY  age 69s  . BREAST SURGERY Right    lumpectomy-multiple times  . BUNIONECTOMY WITH HAMMERTOE RECONSTRUCTION Bilateral ~2000  . CATARACT EXTRACTION Bilateral 2007  . KNEE SURGERY Bilateral as teen  . TONSILLECTOMY AND ADENOIDECTOMY  age 63  . 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 TOTAL VAGINAL HYSTERECTOMY  age 54    There were no vitals filed for this visit.      Subjective Assessment - 03/19/16 1304    Subjective Pt reports she was just at [facility] and was able to walk for up to 1 hour without any difficulty.    Pertinent History R anterior THA 2015; bilat cartilage debridement in knees in 1950's; history of injections in T  spine about 3-4YA.    Currently in Pain? Other (Comment)  not currently, but feel it setting in                          [facility] Adult PT Treatment/Exercise - 03/19/16 0001      Exercises   Exercises Other Exercises   Other Exercises  seated rows, red TB 2x10     Lumbar Exercises: Supine   Bridge 15 reps   Straight Leg Raise 10 reps  x2 sets, with ab set to prevent trunk rotation      Manual Therapy   Manual Therapy Myofascial release;Other (comment)   Manual therapy comments separate from all other interventions    Myofascial Release Rt glute/piriformis                PT Education - 03/19/16 1353    Education provided Yes   Education Details purpose for special testing performed at last session; technique with therex; postural correction and awareness with prolonged sitting; techniques to address soft tissue restrictions at home    Person(s) Educated Patient   Methods Demonstration;Explanation   Comprehension Verbalized understanding;Returned demonstration          PT Short Term Goals - 03/05/16 1345      PT SHORT TERM GOAL #1   Title Pt will demonstrate independence in beginning home  exercise program by twos weeks after commencement of therapy, to affirm self-efficacy in work at home to making progress toward goals.   Status Achieved     PT SHORT TERM GOAL #2   Baseline Able to ambulate 710 ft, but continues to have pain.    Status Partially Met     PT SHORT TERM GOAL #3   Title Pt will improve 5x STS time by 50% by the 4th week of therapy to demonstrate improved indep in functional mobility and to decrease risk of falls in the home.    Baseline 8/9: improved to 17.55seconds   Status Partially Met           PT Long Term Goals - 03/05/16 1347      PT LONG TERM GOAL #1   Title Pt will demonstrate independence in advanced home exercise program by 1 week prior to discharge, to further self-efficacy in continuation of progress toward goals  after discharge from therapy.    Status Partially Met     PT LONG TERM GOAL #2   Title Pt will ambulate 1045f s AD and s exacerbation of symptoms after 8 weeks, to demonstrate improved activity tolerance and improved independence in community ambulation and IADL.    Status On-going     PT LONG TERM GOAL #3   Title Pt will demonstrate improved SLS time to >20s bilat and 5x STS time to <12s to demonstrate improved strength for funcitonal mobility at home.    Baseline 8/9: 20 seconds for SLS on B LE's   Status Achieved               Plan - 03/19/16 1359    Clinical Impression Statement Today's session focused on progressions of hip and trunk strengthening therex. Pt was able to perform all exercises with decreased cues for proper technique. Ended session with STM to Rt glute region to improve muscle spasm and pain. Discussed techniques for self-massage at home with pt verbalizing understanding at this time.   Rehab Potential Good   Clinical Impairments Affecting Rehab Potential Osteopenia, chronicity of pain, scoliosis   PT Frequency 3x / week   PT Duration 8 weeks   PT Treatment/Interventions Moist Heat;Therapeutic exercise;Therapeutic activities;Gait training;Stair training;Balance training;Passive range of motion;Patient/family education;Functional mobility training   PT Next Visit Plan STM/MFR to R sided glute max/ piriformis; continue to work on functional strengthing of hip rotators bilat. as well as trunk strength; AVOID FOCUS ON SIJ ROTATION CORRECTION;   PT Home Exercise Plan hamstring stretch, and standing sidebending stretch.    Consulted and Agree with Plan of Care Patient      Patient will benefit from skilled therapeutic intervention in order to improve the following deficits and impairments:  Abnormal gait, Decreased range of motion, Impaired vision/preception, Decreased knowledge of precautions, Decreased balance, Decreased strength, Decreased mobility, Hypomobility,  Impaired flexibility, Pain  Visit Diagnosis: Bilateral low back pain without sciatica  Pain in thoracic spine  Muscle weakness (generalized)  Abnormal posture     Problem List Patient Active Problem List   Diagnosis Date Noted  . Arthritis of right hip 12/16/2013  . Status post THR (total hip replacement) 12/16/2013    2:03 PM,03/19/16 Doris ModenaPT, DPT [facility] [address] [address]Phone: [phone]  Fax:  [phone] Name: Doris KOPPMRN: [id number] of Birth: 6Feb 03, 1942

## 2016-03-21 ENCOUNTER — Encounter (HOSPITAL_COMMUNITY): Payer: Medicare HMO

## 2016-03-25 ENCOUNTER — Ambulatory Visit (HOSPITAL_COMMUNITY): Payer: Medicare HMO | Admitting: Physical Therapy

## 2016-03-25 DIAGNOSIS — M545 Low back pain, unspecified: Secondary | ICD-10-CM

## 2016-03-25 DIAGNOSIS — M546 Pain in thoracic spine: Secondary | ICD-10-CM

## 2016-03-25 DIAGNOSIS — M6281 Muscle weakness (generalized): Secondary | ICD-10-CM

## 2016-03-25 DIAGNOSIS — R293 Abnormal posture: Secondary | ICD-10-CM

## 2016-03-25 NOTE — Therapy (Signed)
[place], [place], [address] Phone: [phone]   Fax:  [phone]  Physical Therapy Treatment  Patient Details  Name: Doris Ford MRN: [id number] Date of Birth: Nov 03, 1940 Referring Provider: Benita Stabile  Encounter Date: 03/25/2016      PT End of Session - 03/25/16 1603    Visit Number 15   Number of Visits 24   Date for PT Re-Evaluation 2016-05-03   Authorization Type [employer]na Medicare    Authorization Time Period Mar 02, 2016-05/03/16 (G-Codes done visit 09-30-2022)    Authorization - Visit Number 15   Authorization - Number of Visits 19   PT Start Time 1301   PT Stop Time 1344   PT Time Calculation (min) 43 min   Activity Tolerance Patient tolerated treatment well   Behavior During Therapy [facility] for tasks assessed/performed      Past Medical History:  Diagnosis Date  . Arthritis   . Cancer ([place]) 12/07   right breast/tx with lumpectomy and tamoxifen  . Hypertension    under control  . Interstitial cystitis   . Osteopenia    no change    Past Surgical History:  Procedure Laterality Date  . BILATERAL SALPINGOOPHORECTOMY  age 85s  . BREAST SURGERY Right    lumpectomy-multiple times  . BUNIONECTOMY WITH HAMMERTOE RECONSTRUCTION Bilateral ~2000  . CATARACT EXTRACTION Bilateral 2007  . KNEE SURGERY Bilateral as teen  . TONSILLECTOMY AND ADENOIDECTOMY  age 28  . 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 TOTAL VAGINAL HYSTERECTOMY  age 49    There were no vitals filed for this visit.      Subjective Assessment - 03/25/16 1305    Subjective Pt reports that she is doing good. She has decided to make a habit of going walking for atleast 15 minutes daily. Sh ehas been doing her HEP regularly and would like a handout of all exercises she needs to keep up with  on Friday.    Pertinent History R anterior THA 2015; bilat cartilage debridement in knees in 1950's; history of injections in T spine about 3-4YA.    Currently in Pain? No/denies                         [facility] Adult PT Treatment/Exercise - 03/25/16 0001      Exercises   Other Exercises  seated lateral flexion stretch to Lt 3x10 sec; seated lateral flexion to Rt with correction x8 reps; seated trunk rotation Lt/Rt x10, 10 sec hold     Lumbar Exercises: Seated   Sit to Stand 15 reps   Sit to Stand Limitations verbal cues to prevent knee valgus      Lumbar Exercises: Supine   Bridge 15 reps   Bridge Limitations x2 sets with ab set    Other Supine Lumbar Exercises dead bug x20 reps      Lumbar Exercises: Quadruped   Single Arm Raise Left;Right;10 reps   Straight Leg Raise 10 reps   Straight Leg Raises Limitations BLE   Opposite Arm/Leg Raise 10 reps;Right arm/Left leg;Left arm/Right leg   Opposite Arm/Leg Raise Limitations tactile cues provided to decrease trunk rotation, increased difficulty with LUE/RLE                PT Education - 03/25/16 1601  Education provided Yes   Education Details reviewed HEP; discussed imbalances noted with scoliosis and purpose of various exercises performed; possible benefits of shoe lift to address functional leg length descrepancy    Person(s) Educated Patient   Methods Explanation;Handout   Comprehension Verbalized understanding;Returned demonstration          PT Short Term Goals - 03/05/16 1345      PT SHORT TERM GOAL #1   Title Pt will demonstrate independence in beginning home exercise program by twos weeks after commencement of therapy, to affirm self-efficacy in work at home to making progress toward goals.   Status Achieved     PT SHORT TERM GOAL #2   Baseline Able to ambulate 710 ft, but continues to have pain.    Status Partially Met     PT SHORT TERM GOAL #3   Title Pt will improve 5x STS time by 50%  by the 4th week of therapy to demonstrate improved indep in functional mobility and to decrease risk of falls in the home.    Baseline 8/9: improved to 17.55seconds   Status Partially Met           PT Long Term Goals - 03/05/16 1347      PT LONG TERM GOAL #1   Title Pt will demonstrate independence in advanced home exercise program by 1 week prior to discharge, to further self-efficacy in continuation of progress toward goals after discharge from therapy.    Status Partially Met     PT LONG TERM GOAL #2   Title Pt will ambulate 1015f s AD and s exacerbation of symptoms after 8 weeks, to demonstrate improved activity tolerance and improved independence in community ambulation and IADL.    Status On-going     PT LONG TERM GOAL #3   Title Pt will demonstrate improved SLS time to >20s bilat and 5x STS time to <12s to demonstrate improved strength for funcitonal mobility at home.    Baseline 8/9: 20 seconds for SLS on B LE's   Status Achieved               Plan - 03/25/16 1603    Clinical Impression Statement Pt arrived today with report of no pain. She has been consistently performing her HEP and has recently started to walk more consistently during the day to improve overall fitness. Session focused on finalizing HEP to prevent further limitations in ROM and strength and pt was able to return demonstration with minimal verbal cues for technique. We also discussed possibility of discharge at her next visit secondary to improved independence with HEP and overall strength and activity tolerance.    Rehab Potential Good   Clinical Impairments Affecting Rehab Potential Osteopenia, chronicity of pain, scoliosis   PT Frequency 3x / week   PT Duration 8 weeks   PT Treatment/Interventions Moist Heat;Therapeutic exercise;Therapeutic activities;Gait training;Stair training;Balance training;Passive range of motion;Patient/family education;Functional mobility training   PT Next Visit Plan  address possible LLD with shoe lift; continue to work on functional strengthing of hip rotators bilat. as well as trunk strength; possible d/c as pt feels she is ready to continue on her own at home.    PT Home Exercise Plan seated side bending stretch to Lt, seated lateral flexion and correction to Rt, ab set with bridge, sit to stand with focus on prevention of hip adduction, dead bug   Consulted and Agree with Plan of Care Patient      Patient will benefit from  skilled therapeutic intervention in order to improve the following deficits and impairments:  Abnormal gait, Decreased range of motion, Impaired vision/preception, Decreased knowledge of precautions, Decreased balance, Decreased strength, Decreased mobility, Hypomobility, Impaired flexibility, Pain  Visit Diagnosis: Bilateral low back pain without sciatica  Pain in thoracic spine  Muscle weakness (generalized)  Abnormal posture     Problem List Patient Active Problem List   Diagnosis Date Noted  . Arthritis of right hip 12/16/2013  . Status post THR (total hip replacement) 12/16/2013   4:10 PM,03/25/16 Elly Modena PT, DPT [facility] [place] [address] [place], [place], [address] Phone: [phone]   Fax:  [phone]  Name: Doris Ford MRN: [id number] Date of Birth: October 29, 1940

## 2016-03-26 ENCOUNTER — Ambulatory Visit (HOSPITAL_COMMUNITY): Payer: Medicare HMO | Admitting: Physical Therapy

## 2016-03-28 ENCOUNTER — Ambulatory Visit (HOSPITAL_COMMUNITY): Payer: Medicare HMO | Attending: Physician Assistant

## 2016-03-28 DIAGNOSIS — M545 Low back pain, unspecified: Secondary | ICD-10-CM

## 2016-03-28 DIAGNOSIS — R293 Abnormal posture: Secondary | ICD-10-CM | POA: Diagnosis not present

## 2016-03-28 DIAGNOSIS — M546 Pain in thoracic spine: Secondary | ICD-10-CM | POA: Diagnosis not present

## 2016-03-28 DIAGNOSIS — M6281 Muscle weakness (generalized): Secondary | ICD-10-CM | POA: Insufficient documentation

## 2016-03-28 NOTE — Therapy (Signed)
PHYSICAL THERAPY DISCHARGE SUMMARY  Visits from Start of Care: 16  Current functional level related to goals / functional outcomes: *See below    Remaining deficits: *See below    Education / Equipment: *See below  Plan: Patient agrees to discharge.  Patient goals were partially met. Patient is being discharged due to being pleased with the current functional level.  ?????         3:11 PM, 03/28/16 Etta Grandchild, PT, DPT Physical Therapist at [place] [phone] (office)       [place], [place], [address] Phone: [phone]   Fax:  [phone]  Physical Therapy Treatment  Patient Details  Name: Doris Ford MRN: [id number] Date of Birth: Feb 26, 1941 Referring Provider: Benita Stabile   Encounter Date: 03/28/2016      PT End of Session - 03/28/16 1500    Visit Number 16   Number of Visits 24   Authorization Type [employer]    Authorization Time Period 2016/02/21-09/27/2017 (G-Codes done visit 09-20-2022)    Authorization - Visit Number 16   Authorization - Number of Visits 19   PT Start Time 3244   PT Stop Time 1358   PT Time Calculation (min) 53 min   Activity Tolerance Patient tolerated treatment well   Behavior During Therapy [facility] for tasks assessed/performed      Past Medical History:  Diagnosis Date  . Arthritis   . Cancer ([place]) 12/07   right breast/tx with lumpectomy and tamoxifen  . Hypertension    under control  . Interstitial cystitis   . Osteopenia    no change    Past Surgical History:  Procedure Laterality Date  . BILATERAL SALPINGOOPHORECTOMY  age 42s  . BREAST SURGERY Right    lumpectomy-multiple times  . BUNIONECTOMY WITH HAMMERTOE RECONSTRUCTION Bilateral ~2000  . CATARACT EXTRACTION Bilateral 2007  . KNEE SURGERY Bilateral as teen  . TONSILLECTOMY AND ADENOIDECTOMY  age 13  . TOTAL HIP ARTHROPLASTY Right 12/16/2013   Procedure: RIGHT  TOTAL HIP ARTHROPLASTY ANTERIOR APPROACH;  Surgeon: Mcarthur Rossetti, MD;  Location: [facility];  Service: Orthopedics;  Laterality: Right;  . TOTAL VAGINAL HYSTERECTOMY  age 45    There were no vitals filed for this visit.      Subjective Assessment - 03/28/16 1309    Subjective Pt is doing well. She continues to walk and stay on her feet longer and pain has remained mostly controlled. She is excited abotu DC and ready for HEP updates.    Pertinent History R anterior THA 2015; bilat cartilage debridement in knees in 1950's; history of injections in T spine about 3-4YA.    How long can you sit comfortably? Sitting comfort has improved, but patient is not motivated to sit for prolonged periods.    How long can you stand comfortably? Reports that it is improved, but still somewhat limiting; 15-20 minutes (kitchen activity/cutting a watermelon) on 8/9.    How long can you walk comfortably? Pt has been out on her feet since 11:00 this morning and feel good; 30-45 minutes (grocery shopping, but without limtting) 8/9.   Currently in Pain? Yes   Pain Score 1    Pain Location --  Over the R SI joint   Pain Onset More than a month ago   Pain Frequency Constant            [facility] PT Assessment - 03/28/16 0001  Assessment   Medical Diagnosis Bilat LBP; Left lateral mid throacic pain.    Referring Provider Benita Stabile    Hand Dominance Right   Next MD Visit None scheduled    Prior Therapy For THA only 2YA     Precautions   Precautions None   Precaution Comments BMD done multiple times, ISQ     Balance Screen   Has the patient fallen in the past 6 months No   Has the patient had a decrease in activity level because of a fear of falling?  No  recently started to increase.    Is the patient reluctant to leave their home because of a fear of falling?  No     Prior Function   Level of Independence Independent   Vocation Retired   Leisure flowers, needling, reading, scrap booking       Strength   Right Hip Flexion 5/5  4-/5 at eval   Right Hip Extension 4/5  (range is limited) 3-/5 at evaluation   Right Hip External Rotation  5/5  4-/5 at eval   Right Hip Internal Rotation 4+/5  limited by unstable knee joint   Right Hip ABduction 5/5  3-/5 at eval   Right Hip ADduction 5/5   Left Hip Flexion 5/5  4-/5 at eval   Left Hip Extension 4+/5  (range is limited) 2+/5 at eval   Left Hip External Rotation 5/5   Left Hip Internal Rotation 5/5   Left Hip ABduction 5/5  (3/5 at eval)   Left Hip ADduction 5/5   Right Knee Flexion 5/5  4-/5 at evaluation   Right Knee Extension 5/5   Left Knee Flexion 5/5   Left Knee Extension 5/5  4+/5 at evaluation     Transfers   Five time sit to stand comments  17.93s hands free  20.88s at eval     High Level Balance   High Level Balance Comments SLS: L=28.2; R=17.12  L=10s, R=14s at eval                     [facility] Adult PT Treatment/Exercise - 03/28/16 0001      Lumbar Exercises: Stretches   Piriformis Stretch --  seated G-max/piriformis stretch 1x60sec; HEP     Lumbar Exercises: Standing   Other Standing Lumbar Exercises Standing Hip extension 1x10 bilat (HEP)     Lumbar Exercises: Seated   Sit to Stand 10 reps  Review form for HEP     Lumbar Exercises: Supine   Large Ball Oblique Isometric Limitations Clamshell with band   1x10 for HEP                  PT Short Term Goals - 03/05/16 1345      PT SHORT TERM GOAL #1   Title Pt will demonstrate independence in beginning home exercise program by twos weeks after commencement of therapy, to affirm self-efficacy in work at home to making progress toward goals.   Status Achieved     PT SHORT TERM GOAL #2   Baseline Able to ambulate 710 ft, but continues to have pain.    Status Partially Met     PT SHORT TERM GOAL #3   Title Pt will improve 5x STS time by 50% by the 4th week of therapy to demonstrate improved indep in functional mobility  and to decrease risk of falls in the home.    Baseline 8/9: improved to 17.55seconds   Status Partially  Met           PT Long Term Goals - 04/21/16 1506      PT LONG TERM GOAL #1   Title Pt will demonstrate independence in advanced home exercise program by 1 week prior to discharge, to further self-efficacy in continuation of progress toward goals after discharge from therapy.    Status Achieved     PT LONG TERM GOAL #2   Title Pt will ambulate 1066f s AD and s exacerbation of symptoms after 8 weeks, to demonstrate improved activity tolerance and improved independence in community ambulation and IADL.    Status Achieved     PT LONG TERM GOAL #3   Title Pt will demonstrate improved SLS time to >20s bilat and 5x STS time to <12s to demonstrate improved strength for funcitonal mobility at home.    Baseline LLE>20s, RLE <20s   Status Partially Met               Plan - 009/25/171500    Clinical Impression Statement Pt continues to demonstrate improve tolerance to funcitonal activity with minimal aggravation of CC/pain. Reassessment performed today in preparation for DC. Pt HEP is updated to invole more functional moderate strengthening of glute max bilat in opn and closed chains. Since evaluation, the patient demonstrates progress in balance, BLE strength, and tolearnce to strength testing. She has less pain with prolonged sitting and standing, and less intense pain with these as well. Pt has met most of her goals and is prepared for DC. She is given referral for massage therapy to use PRN for continued aggravtion of glute max and referral to a local orthotics practicioner for a heel lift to address her continued low level SI joint pain in the presence of LLD.    Rehab Potential Good   Clinical Impairments Affecting Rehab Potential Osteopenia, chronicity of pain, scoliosis   PT Frequency 3x / week   PT Duration 8 weeks   PT Treatment/Interventions Moist Heat;Therapeutic  exercise;Therapeutic activities;Gait training;Stair training;Balance training;Passive range of motion;Patient/family education;Functional mobility training   Consulted and Agree with Plan of Care Patient      Patient will benefit from skilled therapeutic intervention in order to improve the following deficits and impairments:  Abnormal gait, Decreased range of motion, Impaired vision/preception, Decreased knowledge of precautions, Decreased balance, Decreased strength, Decreased mobility, Hypomobility, Impaired flexibility, Pain  Visit Diagnosis: Bilateral low back pain without sciatica  Pain in thoracic spine  Muscle weakness (generalized)  Abnormal posture       G-Codes - 0Sep 25, 20171507    Functional Assessment Tool Used Clinical Judgment    Functional Limitation Mobility: Walking and moving around   Mobility: Walking and Moving Around Goal Status [phone] At least 1 percent but less than 20 percent impaired, limited or restricted   Mobility: Walking and Moving Around Discharge Status [phone] At least 1 percent but less than 20 percent impaired, limited or restricted      Problem List Patient Active Problem List   Diagnosis Date Noted  . Arthritis of right hip 12/16/2013  . Status post THR (total hip replacement) 12/16/2013   3:10 PM, 009/25/17AEtta Grandchild PT, DPT Physical Therapist at [phone](office)     [place] [address]Phone: [phone]  Fax:  [phone] Name: Doris KUNAMRN: [id number] of Birth: [birth year]

## 2016-04-25 ENCOUNTER — Ambulatory Visit: Payer: Medicare HMO | Admitting: Obstetrics & Gynecology

## 2016-05-13 DIAGNOSIS — R69 Illness, unspecified: Secondary | ICD-10-CM | POA: Diagnosis not present

## 2016-05-27 DIAGNOSIS — R69 Illness, unspecified: Secondary | ICD-10-CM | POA: Diagnosis not present

## 2016-06-09 DIAGNOSIS — R69 Illness, unspecified: Secondary | ICD-10-CM | POA: Diagnosis not present

## 2016-11-04 DIAGNOSIS — E7800 Pure hypercholesterolemia, unspecified: Secondary | ICD-10-CM | POA: Diagnosis not present

## 2016-11-04 DIAGNOSIS — R8299 Other abnormal findings in urine: Secondary | ICD-10-CM | POA: Diagnosis not present

## 2016-11-04 DIAGNOSIS — I10 Essential (primary) hypertension: Secondary | ICD-10-CM | POA: Diagnosis not present

## 2016-11-04 DIAGNOSIS — M859 Disorder of bone density and structure, unspecified: Secondary | ICD-10-CM | POA: Diagnosis not present

## 2016-11-11 DIAGNOSIS — I10 Essential (primary) hypertension: Secondary | ICD-10-CM | POA: Diagnosis not present

## 2016-11-11 DIAGNOSIS — Z23 Encounter for immunization: Secondary | ICD-10-CM | POA: Diagnosis not present

## 2016-11-11 DIAGNOSIS — Z853 Personal history of malignant neoplasm of breast: Secondary | ICD-10-CM | POA: Diagnosis not present

## 2016-11-11 DIAGNOSIS — I452 Bifascicular block: Secondary | ICD-10-CM | POA: Diagnosis not present

## 2016-11-11 DIAGNOSIS — M419 Scoliosis, unspecified: Secondary | ICD-10-CM | POA: Diagnosis not present

## 2016-11-11 DIAGNOSIS — R6884 Jaw pain: Secondary | ICD-10-CM | POA: Diagnosis not present

## 2016-11-11 DIAGNOSIS — M859 Disorder of bone density and structure, unspecified: Secondary | ICD-10-CM | POA: Diagnosis not present

## 2016-11-11 DIAGNOSIS — E7800 Pure hypercholesterolemia, unspecified: Secondary | ICD-10-CM | POA: Diagnosis not present

## 2016-11-11 DIAGNOSIS — M1990 Unspecified osteoarthritis, unspecified site: Secondary | ICD-10-CM | POA: Diagnosis not present

## 2016-11-11 DIAGNOSIS — N3010 Interstitial cystitis (chronic) without hematuria: Secondary | ICD-10-CM | POA: Diagnosis not present

## 2016-11-11 DIAGNOSIS — Z0000 Encounter for general adult medical examination without abnormal findings: Secondary | ICD-10-CM | POA: Diagnosis not present

## 2016-11-27 ENCOUNTER — Ambulatory Visit (INDEPENDENT_AMBULATORY_CARE_PROVIDER_SITE_OTHER): Payer: Medicare HMO | Admitting: Physician Assistant

## 2016-12-08 ENCOUNTER — Ambulatory Visit (INDEPENDENT_AMBULATORY_CARE_PROVIDER_SITE_OTHER): Payer: Medicare HMO

## 2016-12-08 ENCOUNTER — Other Ambulatory Visit (HOSPITAL_COMMUNITY): Payer: Self-pay | Admitting: Internal Medicine

## 2016-12-08 ENCOUNTER — Encounter (INDEPENDENT_AMBULATORY_CARE_PROVIDER_SITE_OTHER): Payer: Self-pay | Admitting: Physician Assistant

## 2016-12-08 ENCOUNTER — Ambulatory Visit (INDEPENDENT_AMBULATORY_CARE_PROVIDER_SITE_OTHER): Payer: Medicare HMO | Admitting: Physician Assistant

## 2016-12-08 ENCOUNTER — Encounter (INDEPENDENT_AMBULATORY_CARE_PROVIDER_SITE_OTHER): Payer: Self-pay

## 2016-12-08 DIAGNOSIS — M25551 Pain in right hip: Secondary | ICD-10-CM

## 2016-12-08 DIAGNOSIS — Z1231 Encounter for screening mammogram for malignant neoplasm of breast: Secondary | ICD-10-CM

## 2016-12-08 DIAGNOSIS — M25552 Pain in left hip: Secondary | ICD-10-CM | POA: Diagnosis not present

## 2016-12-08 MED ORDER — LIDOCAINE HCL 1 % IJ SOLN
3.0000 mL | INTRAMUSCULAR | Status: AC | PRN
  Administered 2016-12-08: 3 mL

## 2016-12-08 MED ORDER — METHYLPREDNISOLONE ACETATE 40 MG/ML IJ SUSP
40.0000 mg | INTRAMUSCULAR | Status: AC | PRN
  Administered 2016-12-08: 40 mg via INTRA_ARTICULAR

## 2016-12-08 NOTE — Progress Notes (Signed)
Office Visit Note   Patient: Doris Ford           Date of Birth: 08-Aug-1940           MRN: [id number] Visit Date: 12/08/2016              Requested by: Haywood Pao, MD [address] [place], [place] [address] PCP: Doris Casco [name], MD   Assessment & Plan: Visit Diagnoses:  1. Pain in right hip   2. Pain in left hip     Plan: IT band stretching exercises reviewed with patient. She'll follow up when necessary.  Follow-Up Instructions: No Follow-up on file.   Orders:  Orders Placed This Encounter  Procedures  . Large Joint Injection/Arthrocentesis  . Large Joint Injection/Arthrocentesis  . XR HIP UNILAT W OR W/O PELVIS 2-3 VIEWS LEFT  . XR HIP UNILAT W OR W/O PELVIS 2-3 VIEWS RIGHT   No orders of the defined types were placed in this encounter.     Procedures: Large Joint Inj Date/Time: 12/08/2016 4:16 PM Performed by: Pete Pelt Authorized by: Pete Pelt   Consent Given by:  Patient Indications:  Pain Location:  Hip Site:  R greater trochanter Needle Size:  22 G Needle Length:  1.5 inches Approach:  Lateral Ultrasound Guidance: No   Fluoroscopic Guidance: No   Arthrogram: No   Medications:  40 mg methylPREDNISolone acetate 40 MG/ML; 3 mL lidocaine 1 % Aspiration Attempted: No   Patient tolerance:  Patient tolerated the procedure well with no immediate complications Large Joint Inj Date/Time: 12/08/2016 4:17 PM Performed by: Pete Pelt Authorized by: Pete Pelt   Consent Given by:  Patient Indications:  Pain Location:  Hip Site:  L greater trochanter Needle Size:  22 G Needle Length:  1.5 inches Approach:  Lateral Ultrasound Guidance: No   Fluoroscopic Guidance: No   Arthrogram: No   Medications:  40 mg methylPREDNISolone acetate 40 MG/ML; 3 mL lidocaine 1 % Aspiration Attempted: No   Patient tolerance:  Patient tolerated the procedure well with no immediate complications     Clinical Data: No additional  findings.   Subjective: Chief Complaint  Patient presents with  . Right Hip - Pain  . Left Hip - Pain    HPI 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 She comes in today with bilateral hip pain lateral aspect. Questions bursitis. She's tried Aleve and Tylenol with no relief. She's had no known injury to either hip. No radicular symptoms down either leg. Review of Systems Bilateral hip pain. No radicular symptoms on the leg no back pain.  Objective: Vital Signs: There were no vitals taken for this visit.  Physical Exam  Constitutional: She is oriented to person, place, and time. She appears well-developed and well-nourished. No distress.  Neurological: She is alert and oriented to person, place, and time.  Psychiatric: She has a normal mood and affect. Her behavior is normal.    Ortho Exam Excellent range of motion of bilateral hips without pain. Tenderness over the trochanteric region both hips. Specialty Comments:  No specialty comments available.  Imaging: Xr Hip Unilat W Or W/o Pelvis 2-3 Views Left  Result Date: 12/08/2016 Left hip AP pelvis: No acute fracture. Left hips well located and well preserved.  Xr Hip Unilat W Or W/o Pelvis 2-3 Views Right  Result Date: 12/08/2016 AP pelvis and lateral view of the right hip: Status  post right total hip arthroplasty with well-seated components no evidence of the eccentric wear or loosening. No acute fracture.    PMFS History: Patient Active Problem List   Diagnosis Date Noted  . Arthritis of right hip 12/16/2013  . Status post THR (total hip replacement) 12/16/2013   Past Medical History:  Diagnosis Date  . Arthritis   . Cancer ([place]) 12/07   right breast/tx with lumpectomy and tamoxifen  . Hypertension    under control  . Interstitial cystitis   . Osteopenia    no change    Family History  Problem Relation Age of Onset  . Cancer Mother         breast/mastectomy  . Heart disease Mother        poor circulation  . Deep vein thrombosis Mother   . Osteoporosis Mother   . Hypertension Father     Past Surgical History:  Procedure Laterality Date  . BILATERAL SALPINGOOPHORECTOMY  age 49s  . BREAST SURGERY Right    lumpectomy-multiple times  . BUNIONECTOMY WITH HAMMERTOE RECONSTRUCTION Bilateral ~2000  . CATARACT EXTRACTION Bilateral 2007  . KNEE SURGERY Bilateral as teen  . TONSILLECTOMY AND ADENOIDECTOMY  age 23  . 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 TOTAL VAGINAL HYSTERECTOMY  age 47   Social History   Occupational History  . Not on file.   Social History Main Topics  . Smoking status: Never Smoker  . Smokeless tobacco: Never Used  . Alcohol use No  . Drug use: No  . Sexual activity: No     Comment: [facility]

## 2016-12-12 ENCOUNTER — Ambulatory Visit (HOSPITAL_COMMUNITY)
Admission: RE | Admit: 2016-12-12 | Discharge: 2016-12-12 | Disposition: A | Discharge status: Home / Self Care | Payer: Medicare HMO | Source: Ambulatory Visit | Attending: Internal Medicine | Admitting: Internal Medicine

## 2016-12-12 DIAGNOSIS — Z1231 Encounter for screening mammogram for malignant neoplasm of breast: Secondary | ICD-10-CM | POA: Diagnosis not present

## 2017-01-22 DIAGNOSIS — L728 Other follicular cysts of the skin and subcutaneous tissue: Secondary | ICD-10-CM | POA: Diagnosis not present

## 2017-01-22 DIAGNOSIS — D235 Other benign neoplasm of skin of trunk: Secondary | ICD-10-CM | POA: Diagnosis not present

## 2017-01-22 DIAGNOSIS — D485 Neoplasm of uncertain behavior of skin: Secondary | ICD-10-CM | POA: Diagnosis not present

## 2017-01-22 DIAGNOSIS — L821 Other seborrheic keratosis: Secondary | ICD-10-CM | POA: Diagnosis not present

## 2017-03-03 DIAGNOSIS — H04129 Dry eye syndrome of unspecified lacrimal gland: Secondary | ICD-10-CM | POA: Diagnosis not present

## 2017-03-03 DIAGNOSIS — M1380 Other specified arthritis, unspecified site: Secondary | ICD-10-CM | POA: Diagnosis not present

## 2017-03-03 DIAGNOSIS — Z0000 Encounter for general adult medical examination without abnormal findings: Secondary | ICD-10-CM | POA: Diagnosis not present

## 2017-03-03 DIAGNOSIS — Z7982 Long term (current) use of aspirin: Secondary | ICD-10-CM | POA: Diagnosis not present

## 2017-03-03 DIAGNOSIS — I10 Essential (primary) hypertension: Secondary | ICD-10-CM | POA: Diagnosis not present

## 2017-03-03 DIAGNOSIS — Z6821 Body mass index (BMI) 21.0-21.9, adult: Secondary | ICD-10-CM | POA: Diagnosis not present

## 2017-03-12 DIAGNOSIS — H5200 Hypermetropia, unspecified eye: Secondary | ICD-10-CM | POA: Diagnosis not present

## 2017-03-12 DIAGNOSIS — Z0100 Encounter for examination of eyes and vision without abnormal findings: Secondary | ICD-10-CM | POA: Diagnosis not present

## 2017-03-31 ENCOUNTER — Emergency Department (HOSPITAL_COMMUNITY): Payer: Medicare HMO

## 2017-03-31 ENCOUNTER — Encounter (HOSPITAL_COMMUNITY): Payer: Self-pay | Admitting: *Deleted

## 2017-03-31 ENCOUNTER — Emergency Department (HOSPITAL_COMMUNITY)
Admission: EM | Admit: 2017-03-31 | Discharge: 2017-03-31 | Disposition: A | Discharge status: Home / Self Care | Payer: Medicare HMO | Attending: Emergency Medicine | Admitting: Emergency Medicine

## 2017-03-31 DIAGNOSIS — Y9389 Activity, other specified: Secondary | ICD-10-CM | POA: Insufficient documentation

## 2017-03-31 DIAGNOSIS — R55 Syncope and collapse: Secondary | ICD-10-CM | POA: Diagnosis not present

## 2017-03-31 DIAGNOSIS — I10 Essential (primary) hypertension: Secondary | ICD-10-CM | POA: Diagnosis not present

## 2017-03-31 DIAGNOSIS — Z79899 Other long term (current) drug therapy: Secondary | ICD-10-CM | POA: Insufficient documentation

## 2017-03-31 DIAGNOSIS — Y929 Unspecified place or not applicable: Secondary | ICD-10-CM | POA: Diagnosis not present

## 2017-03-31 DIAGNOSIS — S0990XA Unspecified injury of head, initial encounter: Secondary | ICD-10-CM | POA: Diagnosis not present

## 2017-03-31 DIAGNOSIS — W19XXXA Unspecified fall, initial encounter: Secondary | ICD-10-CM | POA: Diagnosis not present

## 2017-03-31 DIAGNOSIS — Y998 Other external cause status: Secondary | ICD-10-CM | POA: Insufficient documentation

## 2017-03-31 LAB — CBC WITH DIFFERENTIAL/PLATELET
Basophils Absolute: 0 10*3/uL (ref 0.0–0.1)
Basophils Relative: 0 %
Eosinophils Absolute: 0.1 10*3/uL (ref 0.0–0.7)
Eosinophils Relative: 1 %
HCT: 41.6 % (ref 36.0–46.0)
Hemoglobin: 14 g/dL (ref 12.0–15.0)
LYMPHS ABS: 1.3 10*3/uL (ref 0.7–4.0)
Lymphocytes Relative: 17 %
MCH: 32.9 pg (ref 26.0–34.0)
MCHC: 33.7 g/dL (ref 30.0–36.0)
MCV: 97.7 fL (ref 78.0–100.0)
Monocytes Absolute: 0.9 10*3/uL (ref 0.1–1.0)
Monocytes Relative: 11 %
Neutro Abs: 5.2 10*3/uL (ref 1.7–7.7)
Neutrophils Relative %: 71 %
Platelets: 227 10*3/uL (ref 150–400)
RBC: 4.26 MIL/uL (ref 3.87–5.11)
RDW: 12.6 % (ref 11.5–15.5)
WBC: 7.5 10*3/uL (ref 4.0–10.5)

## 2017-03-31 LAB — BASIC METABOLIC PANEL
Anion gap: 10 (ref 5–15)
BUN: 25 mg/dL — AB (ref 6–20)
CO2: 30 mmol/L (ref 22–32)
Calcium: 10 mg/dL (ref 8.9–10.3)
Chloride: 99 mmol/L — ABNORMAL LOW (ref 101–111)
Creatinine, Ser: 0.77 mg/dL (ref 0.44–1.00)
GFR calc Af Amer: >60 mL/min (ref >60)
GFR calc non Af Amer: >60 mL/min (ref >60)
Glucose, Bld: 93 mg/dL (ref 65–99)
POTASSIUM: 3.2 mmol/L — AB (ref 3.5–5.1)
Sodium: 139 mmol/L (ref 135–145)

## 2017-03-31 LAB — I-STAT TROPONIN, ED: Troponin i, poc: 0 ng/mL (ref 0.00–0.08)

## 2017-03-31 NOTE — ED Triage Notes (Signed)
Pt states she had just eaten lunch and was getting her dishes cleaned when she blacked out on Friday. Pt states she hit her head on the dishwasher door. She has multiple bruises. To left shoulder and left hip. Sunday she was sitting in church when she got the same feeling as Friday but denies any loc. Pt has taken her BP multiple times with normal reading. Denies blood thinner use.

## 2017-03-31 NOTE — ED Provider Notes (Signed)
[facility] DEPT Provider Note   CSN: [id number] Arrival date & time: 03/31/17  1113     History   Chief Complaint Chief Complaint  Patient presents with  . Fall  . Loss of Consciousness    HPI Doris Ford is a 76 y.o. female. Complaint is fall, syncope  HPI  This is a 76 year old female with history of hypertension. No history of heart disease. Episode of syncope previously once while watching a glass blower about 5 years ago. She felt suddenly very hot and lightheaded and as though she did pass out after a tingling sensation went through her. She did not pass out. She was sitting. He had to lay down for a few minutes.  Friday she was cleaning up after dinner. She had adishwasher open. She leaned down. She states it was hot in the dishwasher. She took something out of the dishwasher and Saturday on the counter. She started to put some chicken into a bowl and she started to felt lightheaded. She fell backwards across the open dishwasher. She is doing well today. Sunday, 2 days ago while ain church,o she stood and then sat. She felt the same tingling the past without syncope or other symptoms.  Past Medical History:  Diagnosis Date  . Arthritis   . Cancer ([place]) 12/07   right breast/tx with lumpectomy and tamoxifen  . Hypertension    under control  . Interstitial cystitis   . Osteopenia    no change    Patient Active Problem List   Diagnosis Date Noted  . Arthritis of right hip 12/16/2013  . Status post THR (total hip replacement) 12/16/2013    Past Surgical History:  Procedure Laterality Date  . BILATERAL SALPINGOOPHORECTOMY  age 75s  . BREAST SURGERY Right    lumpectomy-multiple times  . BUNIONECTOMY WITH HAMMERTOE RECONSTRUCTION Bilateral ~2000  . CATARACT EXTRACTION Bilateral 2007  . KNEE SURGERY Bilateral as teen  . TONSILLECTOMY AND ADENOIDECTOMY  age 49  . TOTAL HIP ARTHROPLASTY Right 12/16/2013   Procedure: RIGHT TOTAL HIP ARTHROPLASTY ANTERIOR APPROACH;   Surgeon: Mcarthur Rossetti, MD;  Location: [facility];  Service: Orthopedics;  Laterality: Right;  . TOTAL VAGINAL HYSTERECTOMY  age 10    OB History    Gravida Para Term Preterm AB Living   2 2       2    SAB TAB Ectopic Multiple Live Births                   Home Medications    Prior to Admission medications   Medication Sig Start Date End Date Taking? Authorizing Provider  acetaminophen (TYLENOL) 500 MG tablet Take 500 mg by mouth every 6 (six) hours as needed for moderate pain.   Yes [provider]  Ascorbic Acid (VITAMIN C PO) Take 1,000 mg by mouth daily.    Yes [provider]  Calcium-Vitamin D (CALTRATE 600 PLUS-VIT D PO) Take 600 mg by mouth daily.   Yes [provider]  chlorthalidone (HYGROTON) 25 MG tablet Take 25 mg by mouth every morning.  10/04/12  Yes [provider]  Cholecalciferol (VITAMIN D PO) Take 1,000 Units by mouth daily.    Yes [provider]  loratadine (CLARITIN) 10 MG tablet Take 10 mg by mouth daily.   Yes [provider]  Multiple Vitamins-Minerals (OCUVITE PO) Take 1 tablet by mouth daily.    Yes [provider]  naproxen sodium (ANAPROX) 220 MG tablet Take 220 mg by  mouth as needed.   Yes [provider]  OVER THE COUNTER MEDICATION Take 1 tablet by mouth 2 (two) times daily. Cysta Q supplyment   Yes [provider]  quinapril (ACCUPRIL) 20 MG tablet Take 20 mg by mouth 2 (two) times daily.   Yes [provider]  RESTASIS 0.05 % ophthalmic emulsion INSTILL 1 DROP INTO BOTH EYES TWICE A DAY 12/11/14  Yes [provider]    Family History Family History  Problem Relation Age of Onset  . Cancer Mother        breast/mastectomy  . Heart disease Mother        poor circulation  . Deep vein thrombosis Mother   . Osteoporosis Mother   . Hypertension Father     Social History Social History  Substance Use Topics  . Smoking status: Never Smoker  .  Smokeless tobacco: Never Used  . Alcohol use No     Allergies   Codeine   Review of Systems Review of Systems  Constitutional: Negative for appetite change, chills, diaphoresis, fatigue and fever.  HENT: Negative for mouth sores, sore throat and trouble swallowing.   Eyes: Negative for visual disturbance.  Respiratory: Negative for cough, chest tightness, shortness of breath and wheezing.   Cardiovascular: Negative for chest pain.  Gastrointestinal: Negative for abdominal distention, abdominal pain, diarrhea, nausea and vomiting.  Endocrine: Negative for polydipsia, polyphagia and polyuria.  Genitourinary: Negative for dysuria, frequency and hematuria.  Musculoskeletal: Negative for gait problem.  Skin: Negative for color change, pallor and rash.  Neurological: Negative for dizziness, syncope, light-headedness and headaches.  Hematological: Does not bruise/bleed easily.  Psychiatric/Behavioral: Negative for behavioral problems and confusion.     Physical Exam Updated Vital Signs BP 119/68   Pulse 75   Temp 97.6 F (36.4 C) (Oral)   Resp 18   Wt 54.4 kg (120 lb)   SpO2 94%   BMI 21.95 kg/m   Physical Exam  Constitutional: She is oriented to person, place, and time. She appears well-developed and well-nourished. No distress.  HENT:  Head: Normocephalic.  Tenderness and contusion on the midline occiput. Nontender midline neck and spine.  Eyes: Pupils are equal, round, and reactive to light. Conjunctivae are normal. No scleral icterus.  conjunctiva are not pale.  Symmetric reactive.  Neck: Normal range of motion. Neck supple. No thyromegaly present.  Cardiovascular: Normal rate and regular rhythm.  Exam reveals no gallop and no friction rub.   No murmur heard. Sinus rhythm on monitor. No murmurs rubs or gallops.  Pulmonary/Chest: Effort normal and breath sounds normal. No respiratory distress. She has no wheezes. She has no rales.  Abdominal: Soft. Bowel sounds are  normal. She exhibits no distension. There is no tenderness. There is no rebound.  Musculoskeletal: Normal range of motion.  Neurological: She is alert and oriented to person, place, and time.  Skin: Skin is warm and dry. No rash noted.  Psychiatric: She has a normal mood and affect. Her behavior is normal.     ED Treatments / Results  Labs (all labs ordered are listed, but only abnormal results are displayed) Labs Reviewed  BASIC METABOLIC PANEL - Abnormal; Notable for the following:       Result Value   Potassium 3.2 (*)    Chloride 99 (*)    BUN 25 (*)    All other components within normal limits  CBC WITH DIFFERENTIAL/PLATELET  I-STAT TROPONIN, ED    EKG  EKG Interpretation None  Radiology Ct Head Wo Contrast  Result Date: 03/31/2017 CLINICAL DATA:  Head injury after fall. EXAM: CT HEAD WITHOUT CONTRAST TECHNIQUE: Contiguous axial images were obtained from the base of the skull through the vertex without intravenous contrast. COMPARISON:  None. FINDINGS: Brain: Mild chronic ischemic white matter disease is noted. No mass effect or midline shift is noted. Ventricular size is within normal limits. There is no evidence of mass lesion, hemorrhage or acute infarction. Vascular: No hyperdense vessel or unexpected calcification. Skull: Normal. Negative for fracture or focal lesion. Sinuses/Orbits: No acute finding. Other: None. IMPRESSION: Mild chronic ischemic white matter disease. No acute intracranial abnormality seen. Electronically Signed   By: Marijo Conception, M.D.   On: 03/31/2017 13:17    Procedures Procedures (including critical care time)  Medications Ordered in ED Medications - No data to display   Initial Impression / Assessment and Plan / ED Course  I have reviewed the triage vital signs and the nursing notes.  Pertinent labs & imaging results that were available during my care of the patient were reviewed by me and considered in my medical decision making  (see chart for details).    Her episodes sound vagal in nature. She is low risk for cardiac disease other than age. CT is normal. EKG is normal. No acute findings on labs. No anemia. K+ low at 3.2. Think she is appropriate for discharge home. Outpatient cardiology referral. Discussed vagal episodes with her. Return here with recurrence.  Final Clinical Impressions(s) / ED Diagnoses   Final diagnoses:  Vaso vagal episode  Vasovagal syncope    New Prescriptions New Prescriptions   No medications on file     Tanna Furry, MD 03/31/17 1340

## 2017-03-31 NOTE — Discharge Instructions (Signed)
Call [facility] (At [facility]) for out patient appointment.

## 2017-04-23 DIAGNOSIS — I10 Essential (primary) hypertension: Secondary | ICD-10-CM | POA: Diagnosis not present

## 2017-04-23 DIAGNOSIS — E876 Hypokalemia: Secondary | ICD-10-CM | POA: Diagnosis not present

## 2017-04-23 DIAGNOSIS — Z23 Encounter for immunization: Secondary | ICD-10-CM | POA: Diagnosis not present

## 2017-04-23 DIAGNOSIS — R55 Syncope and collapse: Secondary | ICD-10-CM | POA: Diagnosis not present

## 2017-04-23 DIAGNOSIS — Z6820 Body mass index (BMI) 20.0-20.9, adult: Secondary | ICD-10-CM | POA: Diagnosis not present

## 2017-05-08 ENCOUNTER — Encounter: Payer: Self-pay | Admitting: Obstetrics & Gynecology

## 2017-05-08 ENCOUNTER — Ambulatory Visit (INDEPENDENT_AMBULATORY_CARE_PROVIDER_SITE_OTHER): Payer: Medicare HMO | Admitting: Obstetrics & Gynecology

## 2017-05-08 VITALS — BP 120/70 | HR 80 | Resp 14 | Ht 62.0 in | Wt 118.0 lb

## 2017-05-08 DIAGNOSIS — Z01419 Encounter for gynecological examination (general) (routine) without abnormal findings: Secondary | ICD-10-CM | POA: Diagnosis not present

## 2017-05-08 NOTE — Patient Instructions (Signed)
shingrix vaccine

## 2017-05-08 NOTE — Progress Notes (Signed)
76 y.o. G2P2 Married CaucasianF here for annual exam.  She reports she started going to yoga and this has really helped with flexibility.  Feels this has helped more than anything.  Doing this weekly.        Denies vaginal bleeding.    Had a syncopal episode in Early September.  Did go to the ER and had a CT scan of her head done.  Followed-up with Dr. Osborne Casco.  Stopped the clorthalidone for a few weeks.  Has not had any new symptoms.    PCP:  Dr. Osborne Casco.  Had physical scheduled for 4/19.  No LMP recorded. Patient has had a hysterectomy.          Sexually active: No.  The current method of family planning is status post hysterectomy.  Exercising: Yes.    yoga Smoker:  no  Health Maintenance: Pap:  2011 normal  History of abnormal Pap:  no MMG:  12/15/16 BIRADS 1 negative  Colonoscopy:  12/20/10 scattered diverticula, repeat 10 years   BMD:   09/2015 at [facility]- osteopenia  TDaP:  Done with PCP  Pneumonia vaccine(s):  A few years ago  Zostavax:   A few years ago  Hep C testing: not indicated  Screening Labs: PCP, Hb today: PCP   reports that she has never smoked. She has never used smokeless tobacco. She reports that she does not drink alcohol or use drugs.  Past Medical History:  Diagnosis Date  . Arthritis   . Cancer ([place]) 12/07   right breast/tx with lumpectomy and tamoxifen  . Hypertension    under control  . Interstitial cystitis   . Osteopenia    no change    Past Surgical History:  Procedure Laterality Date  . BILATERAL SALPINGOOPHORECTOMY  age 76s  . BREAST SURGERY Right    lumpectomy-multiple times  . BUNIONECTOMY WITH HAMMERTOE RECONSTRUCTION Bilateral ~2000  . CATARACT EXTRACTION Bilateral 2007  . KNEE SURGERY Bilateral as teen  . TONSILLECTOMY AND ADENOIDECTOMY  age 46  . TOTAL HIP ARTHROPLASTY Right 12/16/2013   Procedure: RIGHT TOTAL HIP ARTHROPLASTY ANTERIOR APPROACH;  Surgeon: Mcarthur Rossetti, MD;  Location: [facility];  Service: Orthopedics;   Laterality: Right;  . TOTAL VAGINAL HYSTERECTOMY  age 32    Current Outpatient Prescriptions  Medication Sig Dispense Refill  . acetaminophen (TYLENOL) 500 MG tablet Take 500 mg by mouth every 6 (six) hours as needed for moderate pain.    . Ascorbic Acid (VITAMIN C PO) Take 1,000 mg by mouth daily.     . Calcium-Vitamin D (CALTRATE 600 PLUS-VIT D PO) Take 600 mg by mouth daily.    . Cholecalciferol (VITAMIN D PO) Take 1,000 Units by mouth daily.     [name] loratadine (CLARITIN) 10 MG tablet Take 10 mg by mouth daily.    . Multiple Vitamins-Minerals (OCUVITE PO) Take 1 tablet by mouth daily.     . naproxen sodium (ANAPROX) 220 MG tablet Take 220 mg by mouth as needed.    [name] OVER THE COUNTER MEDICATION Take 1 tablet by mouth 2 (two) times daily. Cysta Q supplyment    . quinapril (ACCUPRIL) 20 MG tablet Take 20 mg by mouth 2 (two) times daily.    . RESTASIS 0.05 % ophthalmic emulsion INSTILL 1 DROP INTO BOTH EYES TWICE A DAY  6   No current facility-administered medications for this visit.     Family History  Problem Relation Age of Onset  . Cancer Mother  breast/mastectomy  . Heart disease Mother        poor circulation  . Deep vein thrombosis Mother   . Osteoporosis Mother   . Hypertension Father     ROS:  Pertinent items are noted in HPI.  Otherwise, a comprehensive ROS was negative.  Exam:   BP 120/70 (BP Location: Right Arm, Patient Position: Sitting, Cuff Size: Normal)   Pulse 80   Resp 14   Ht 5\' 2"  (1.575 m)   Wt 118 lb (53.5 kg)   BMI 21.58 kg/m   Weight change: @WEIGHTCHANGE @ Height:   Height: 5\' 2"  (157.5 cm)  Ht Readings from Last 3 Encounters:  05/08/17 5\' 2"  (1.575 m)  02/11/16 5\' 2"  (1.575 m)  01/26/15 5' 3.5" (1.613 m)    General appearance: alert, cooperative and appears stated age Head: Normocephalic, without obvious abnormality, atraumatic Neck: no adenopathy, supple, symmetrical, trachea midline and thyroid normal to inspection and palpation Lungs:  clear to auscultation bilaterally Breasts: normal appearance, no masses or tenderness, well healed right breast scars Heart: regular rate and rhythm Abdomen: soft, non-tender; bowel sounds normal; no masses,  no organomegaly Extremities: extremities normal, atraumatic, no cyanosis or edema Skin: Skin color, texture, turgor normal. No rashes or lesions Lymph nodes: Cervical, supraclavicular, and axillary nodes normal. No abnormal inguinal nodes palpated Neurologic: Grossly normal   Pelvic: External genitalia:  no lesions              Urethra:  normal appearing urethra with no masses, tenderness or lesions              Bartholins and Skenes: normal                 Vagina: normal appearing vagina with normal color and discharge, no lesions              Cervix: absent              Pap taken: No. Bimanual Exam:  Uterus:  uterus absent              Adnexa: no mass, fullness, tenderness               Rectovaginal: Confirms               Anus:  normal sphincter tone, no lesions  Chaperone was present for exam.  A:  Well Woman with normal exam H/O TVH/BSO Vaginal atrophic changes H/o right breast cancer s/p lumpectomy, then Tamoxifen use 12/07 Osteopenia Hypertension Family hx of breast cancer in mother  P:   Mammogram yearly.  Grade 3 dense breasts. pap smear not obtained Has blood work with Dr. Osborne Casco D/w pt shingles vaccination.  Will discuss with Dr. Osborne Casco at her next visit. return annually or prn

## 2017-05-27 DIAGNOSIS — R69 Illness, unspecified: Secondary | ICD-10-CM | POA: Diagnosis not present

## 2017-11-11 DIAGNOSIS — M859 Disorder of bone density and structure, unspecified: Secondary | ICD-10-CM | POA: Diagnosis not present

## 2017-11-11 DIAGNOSIS — M8580 Other specified disorders of bone density and structure, unspecified site: Secondary | ICD-10-CM | POA: Diagnosis not present

## 2017-11-11 DIAGNOSIS — I10 Essential (primary) hypertension: Secondary | ICD-10-CM | POA: Diagnosis not present

## 2017-11-11 DIAGNOSIS — E7800 Pure hypercholesterolemia, unspecified: Secondary | ICD-10-CM | POA: Diagnosis not present

## 2017-11-11 DIAGNOSIS — R82998 Other abnormal findings in urine: Secondary | ICD-10-CM | POA: Diagnosis not present

## 2017-11-18 DIAGNOSIS — E7800 Pure hypercholesterolemia, unspecified: Secondary | ICD-10-CM | POA: Diagnosis not present

## 2017-11-18 DIAGNOSIS — N3010 Interstitial cystitis (chronic) without hematuria: Secondary | ICD-10-CM | POA: Diagnosis not present

## 2017-11-18 DIAGNOSIS — Z0000 Encounter for general adult medical examination without abnormal findings: Secondary | ICD-10-CM | POA: Diagnosis not present

## 2017-11-18 DIAGNOSIS — I452 Bifascicular block: Secondary | ICD-10-CM | POA: Diagnosis not present

## 2017-11-18 DIAGNOSIS — M419 Scoliosis, unspecified: Secondary | ICD-10-CM | POA: Diagnosis not present

## 2017-11-18 DIAGNOSIS — R55 Syncope and collapse: Secondary | ICD-10-CM | POA: Diagnosis not present

## 2017-11-18 DIAGNOSIS — M859 Disorder of bone density and structure, unspecified: Secondary | ICD-10-CM | POA: Diagnosis not present

## 2017-11-18 DIAGNOSIS — M1990 Unspecified osteoarthritis, unspecified site: Secondary | ICD-10-CM | POA: Diagnosis not present

## 2017-11-18 DIAGNOSIS — Z853 Personal history of malignant neoplasm of breast: Secondary | ICD-10-CM | POA: Diagnosis not present

## 2017-11-18 DIAGNOSIS — I10 Essential (primary) hypertension: Secondary | ICD-10-CM | POA: Diagnosis not present

## 2017-11-19 ENCOUNTER — Other Ambulatory Visit (HOSPITAL_COMMUNITY): Payer: Self-pay | Admitting: Internal Medicine

## 2017-11-19 DIAGNOSIS — Z1231 Encounter for screening mammogram for malignant neoplasm of breast: Secondary | ICD-10-CM

## 2017-11-27 DIAGNOSIS — Z1212 Encounter for screening for malignant neoplasm of rectum: Secondary | ICD-10-CM | POA: Diagnosis not present

## 2017-12-02 DIAGNOSIS — M5416 Radiculopathy, lumbar region: Secondary | ICD-10-CM | POA: Diagnosis not present

## 2017-12-02 DIAGNOSIS — M47816 Spondylosis without myelopathy or radiculopathy, lumbar region: Secondary | ICD-10-CM | POA: Diagnosis not present

## 2017-12-02 DIAGNOSIS — M5136 Other intervertebral disc degeneration, lumbar region: Secondary | ICD-10-CM | POA: Diagnosis not present

## 2017-12-17 ENCOUNTER — Encounter (HOSPITAL_COMMUNITY): Payer: Self-pay

## 2017-12-17 ENCOUNTER — Ambulatory Visit (HOSPITAL_COMMUNITY)
Admission: RE | Admit: 2017-12-17 | Discharge: 2017-12-17 | Disposition: A | Discharge status: Home / Self Care | Payer: Medicare HMO | Source: Ambulatory Visit | Attending: Internal Medicine | Admitting: Internal Medicine

## 2017-12-17 ENCOUNTER — Ambulatory Visit (HOSPITAL_COMMUNITY): Payer: Medicare HMO

## 2017-12-17 DIAGNOSIS — Z1231 Encounter for screening mammogram for malignant neoplasm of breast: Secondary | ICD-10-CM | POA: Insufficient documentation

## 2017-12-31 DIAGNOSIS — M47816 Spondylosis without myelopathy or radiculopathy, lumbar region: Secondary | ICD-10-CM | POA: Diagnosis not present

## 2018-01-11 DIAGNOSIS — Z803 Family history of malignant neoplasm of breast: Secondary | ICD-10-CM | POA: Diagnosis not present

## 2018-01-11 DIAGNOSIS — Z8249 Family history of ischemic heart disease and other diseases of the circulatory system: Secondary | ICD-10-CM | POA: Diagnosis not present

## 2018-01-11 DIAGNOSIS — Z791 Long term (current) use of non-steroidal anti-inflammatories (NSAID): Secondary | ICD-10-CM | POA: Diagnosis not present

## 2018-01-11 DIAGNOSIS — M1990 Unspecified osteoarthritis, unspecified site: Secondary | ICD-10-CM | POA: Diagnosis not present

## 2018-01-11 DIAGNOSIS — J309 Allergic rhinitis, unspecified: Secondary | ICD-10-CM | POA: Diagnosis not present

## 2018-01-11 DIAGNOSIS — M8580 Other specified disorders of bone density and structure, unspecified site: Secondary | ICD-10-CM | POA: Diagnosis not present

## 2018-01-11 DIAGNOSIS — Z7982 Long term (current) use of aspirin: Secondary | ICD-10-CM | POA: Diagnosis not present

## 2018-01-11 DIAGNOSIS — H04129 Dry eye syndrome of unspecified lacrimal gland: Secondary | ICD-10-CM | POA: Diagnosis not present

## 2018-01-11 DIAGNOSIS — I10 Essential (primary) hypertension: Secondary | ICD-10-CM | POA: Diagnosis not present

## 2018-01-11 DIAGNOSIS — G8929 Other chronic pain: Secondary | ICD-10-CM | POA: Diagnosis not present

## 2018-01-16 DIAGNOSIS — M47816 Spondylosis without myelopathy or radiculopathy, lumbar region: Secondary | ICD-10-CM | POA: Diagnosis not present

## 2018-05-18 DIAGNOSIS — R69 Illness, unspecified: Secondary | ICD-10-CM | POA: Diagnosis not present

## 2018-06-10 DIAGNOSIS — R69 Illness, unspecified: Secondary | ICD-10-CM | POA: Diagnosis not present

## 2018-06-22 DIAGNOSIS — Z961 Presence of intraocular lens: Secondary | ICD-10-CM | POA: Diagnosis not present

## 2018-06-22 DIAGNOSIS — Z9841 Cataract extraction status, right eye: Secondary | ICD-10-CM | POA: Diagnosis not present

## 2018-06-22 DIAGNOSIS — H5203 Hypermetropia, bilateral: Secondary | ICD-10-CM | POA: Diagnosis not present

## 2018-06-22 DIAGNOSIS — H04123 Dry eye syndrome of bilateral lacrimal glands: Secondary | ICD-10-CM | POA: Diagnosis not present

## 2018-07-16 ENCOUNTER — Other Ambulatory Visit: Payer: Self-pay

## 2018-07-16 ENCOUNTER — Ambulatory Visit (INDEPENDENT_AMBULATORY_CARE_PROVIDER_SITE_OTHER): Payer: Medicare HMO | Admitting: Obstetrics & Gynecology

## 2018-07-16 ENCOUNTER — Encounter: Payer: Self-pay | Admitting: Obstetrics & Gynecology

## 2018-07-16 VITALS — BP 124/72 | HR 68 | Resp 16 | Ht 62.5 in | Wt 118.2 lb

## 2018-07-16 DIAGNOSIS — G8929 Other chronic pain: Secondary | ICD-10-CM

## 2018-07-16 DIAGNOSIS — M545 Low back pain, unspecified: Secondary | ICD-10-CM

## 2018-07-16 DIAGNOSIS — Z01419 Encounter for gynecological examination (general) (routine) without abnormal findings: Secondary | ICD-10-CM

## 2018-07-16 DIAGNOSIS — R3915 Urgency of urination: Secondary | ICD-10-CM

## 2018-07-16 DIAGNOSIS — Z0000 Encounter for general adult medical examination without abnormal findings: Secondary | ICD-10-CM

## 2018-07-16 LAB — POCT URINALYSIS DIPSTICK
BILIRUBIN UA: NEGATIVE
Blood, UA: 1
Glucose, UA: NEGATIVE
Ketones, UA: NEGATIVE
NITRITE UA: NEGATIVE
PH UA: 5 (ref 5.0–8.0)
PROTEIN UA: POSITIVE — AB
UROBILINOGEN UA: 0.2 U/dL

## 2018-07-16 NOTE — Progress Notes (Signed)
77 y.o. G2P2 Married White or Caucasian female here for annual exam.  Reports she started having some back issues this summer.  Back is more stiff and there is soreness.  Reports if she walks too fare or does too much and she just has to sit and rest.   Did see Dr. Niel Hummer.  She is not doing "rejuvination medicine".  Dr. Ninfa Linden did her surgery.  Does not want surgery again.  Would like to see someone who could help with more conservative options.  Options reviewed.  Does take 2 extra strength Tylenol every morning and this does help.  Frustrated that she can't do everything she wants to do.  Denies vaginal bleeding.    Having some issues with urinary urgency.  No leakage unless waits to go to the bathroom.  Denies blood in urine.  PCP:  Dr. Osborne Casco.  Had appt in April with blood work then.  No LMP recorded. Patient has had a hysterectomy.          Sexually active: No.  The current method of family planning is status post hysterectomy.    Exercising: Yes.    yoga, tai chi Smoker:  no  Health Maintenance: Pap:  2011 Normal  History of abnormal Pap:  no MMG:  12/17/17 BIRADS1:neg Colonoscopy:  11/2010 f/u 10 years  BMD:   2017 TDaP:  Up to date with PCP Pneumonia vaccine(s):  A few years ago per patient Zostavax:   A few years ago per patient Hep C testing: n/a Screening Labs: PCP Urine today: +1 RBC, trace Protein, trace WBC   reports that she has never smoked. She has never used smokeless tobacco. She reports that she does not drink alcohol or use drugs.  Past Medical History:  Diagnosis Date  . Arthritis   . Cancer ([place]) 12/07   right breast/tx with lumpectomy and tamoxifen  . Hypertension    under control  . Interstitial cystitis   . Osteopenia    no change    Past Surgical History:  Procedure Laterality Date  . BILATERAL SALPINGOOPHORECTOMY  age 49s  . BREAST SURGERY Right    lumpectomy-multiple times  . BUNIONECTOMY WITH HAMMERTOE RECONSTRUCTION Bilateral ~2000   . CATARACT EXTRACTION Bilateral 2007  . KNEE SURGERY Bilateral as teen  . TONSILLECTOMY AND ADENOIDECTOMY  age 47  . 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 TOTAL VAGINAL HYSTERECTOMY  age 6    Current Outpatient Medications  Medication Sig Dispense Refill  . acetaminophen (TYLENOL) 500 MG tablet Take 500 mg by mouth every 6 (six) hours as needed for moderate pain.    . Ascorbic Acid (VITAMIN C PO) Take 1,000 mg by mouth daily.     . Calcium-Vitamin D (CALTRATE 600 PLUS-VIT D PO) Take 600 mg by mouth daily.    . Cholecalciferol (VITAMIN D PO) Take 1,000 Units by mouth daily.     [name] loratadine (CLARITIN) 10 MG tablet Take 10 mg by mouth daily.    . Multiple Vitamins-Minerals (OCUVITE PO) Take 1 tablet by mouth daily.     . naproxen sodium (ANAPROX) 220 MG tablet Take 220 mg by mouth as needed.    [name] OVER THE COUNTER MEDICATION Take 1 tablet by mouth 2 (two) times daily. Cysta Q supplyment    . quinapril (ACCUPRIL) 20 MG tablet Take 20 mg by mouth  2 (two) times daily.    . RESTASIS 0.05 % ophthalmic emulsion INSTILL 1 DROP INTO BOTH EYES TWICE A DAY  6   No current facility-administered medications for this visit.     Family History  Problem Relation Age of Onset  . Cancer Mother        breast/mastectomy  . Heart disease Mother        poor circulation  . Deep vein thrombosis Mother   . Osteoporosis Mother   . Hypertension Father     Review of Systems  Constitutional: Negative.   HENT: Negative.   Eyes: Negative.   Respiratory: Negative.   Cardiovascular: Negative.   Gastrointestinal: Negative.   Endocrine: Negative.   Genitourinary:       Loss of urine with sneeze or cough  Musculoskeletal: Negative.   Skin: Negative.   Allergic/Immunologic: Negative.   Neurological: Negative.   Hematological: Negative.    Psychiatric/Behavioral: Negative.     Exam:   Vitals:   07/16/18 1255  BP: 124/72  Pulse: 68  Resp: 16    General appearance: alert, cooperative and appears stated age Head: Normocephalic, without obvious abnormality, atraumatic Neck: no adenopathy, supple, symmetrical, trachea midline and thyroid normal to inspection and palpation Lungs: clear to auscultation bilaterally Breasts: normal appearance, no masses or tenderness Heart: regular rate and rhythm Abdomen: soft, non-tender; bowel sounds normal; no masses,  no organomegaly Extremities: extremities normal, atraumatic, no cyanosis or edema Skin: Skin color, texture, turgor normal. No rashes or lesions Lymph nodes: Cervical, supraclavicular, and axillary nodes normal. No abnormal inguinal nodes palpated Neurologic: Grossly normal   Pelvic: External genitalia:  no lesions              Urethra:  normal appearing urethra with no masses, tenderness or lesions              Bartholins and Skenes: normal                 Vagina: normal appearing vagina with normal color and discharge, no lesions              Cervix: absent              Pap taken: No. Bimanual Exam:  Uterus:  uterus absent              Adnexa: no masses or fullness               Rectovaginal: Confirms               Anus:  normal sphincter tone, no lesions  Chaperone was present for exam.  A:  Well Woman with normal exam PMP, no HRT H/o TVH/BSO Vaginal atrophic changes Hypertension Family hx of breast cancer in mother H/O right breast cancer s/p lumpectomy, Tamoxifen until 12/07 Low back pain without sciatica  P:   Mammogram guidelines reviewed pap smear not indicated Lab work it UTD with Dr. Osborne Casco Vaccines are UTD except shingrix vaccination Referral to Dr. Oneida Alar return annually or prn

## 2018-07-18 LAB — URINE CULTURE: Organism ID, Bacteria: NO GROWTH

## 2018-07-19 ENCOUNTER — Telehealth: Payer: Self-pay | Admitting: *Deleted

## 2018-07-19 LAB — URINALYSIS, MICROSCOPIC ONLY
Bacteria, UA: NONE SEEN
CASTS: NONE SEEN /LPF

## 2018-07-19 NOTE — Telephone Encounter (Signed)
Notes recorded by Burnice Logan, RN on 07/19/2018 at 4:29 PM EST Left detailed message, ok per dpr. Advised as seen below per Dr. Sabra Heck. Advised patient to return call to office to advise if she would like to proceed with referral to urology. Advised office will be closed 12/24 & 12/25. ------

## 2018-07-19 NOTE — Telephone Encounter (Signed)
-----   Message from Megan Salon, MD sent at 07/19/2018  1:36 PM EST ----- Please let pt know her urine culture was negative but her urine showed calcium oxalate crystals which can cause kidney stones.  She is having some back issues but I am not sure if this is related.  Does she want to see a urologist for a consultation?

## 2018-07-22 DIAGNOSIS — H26493 Other secondary cataract, bilateral: Secondary | ICD-10-CM | POA: Diagnosis not present

## 2018-07-22 DIAGNOSIS — H35371 Puckering of macula, right eye: Secondary | ICD-10-CM | POA: Diagnosis not present

## 2018-07-22 DIAGNOSIS — H02831 Dermatochalasis of right upper eyelid: Secondary | ICD-10-CM | POA: Diagnosis not present

## 2018-07-22 DIAGNOSIS — H02834 Dermatochalasis of left upper eyelid: Secondary | ICD-10-CM | POA: Diagnosis not present

## 2018-07-22 DIAGNOSIS — Z961 Presence of intraocular lens: Secondary | ICD-10-CM | POA: Diagnosis not present

## 2018-07-22 NOTE — Telephone Encounter (Signed)
Patient returned call and she is given message from Dr. Sabra Heck.  Verbalizes understanding.  Declines urology referral at this time. Feels back pain is not related.  She is advised to call back if she chooses referral or if develops acute pain, hematuria or difficulty passing urine, seek emergency care.  Update to Dr. Sabra Heck.  Encounter closed.

## 2018-08-03 ENCOUNTER — Ambulatory Visit (INDEPENDENT_AMBULATORY_CARE_PROVIDER_SITE_OTHER): Payer: Medicare HMO | Admitting: Family Medicine

## 2018-08-03 ENCOUNTER — Encounter: Payer: Self-pay | Admitting: Family Medicine

## 2018-08-03 VITALS — BP 154/84 | Ht 64.5 in | Wt 118.0 lb

## 2018-08-03 DIAGNOSIS — M545 Low back pain, unspecified: Secondary | ICD-10-CM

## 2018-08-03 DIAGNOSIS — G8929 Other chronic pain: Secondary | ICD-10-CM

## 2018-08-03 NOTE — Patient Instructions (Signed)
Your low back pain is due to arthritis, SI joint dysfunction, and associated lumbar spasms. These are the different medications you can take for this: Tylenol 500mg  1-2 tabs three times a day for pain. Capsaicin, aspercreme, or biofreeze topically up to four times a day may also help with pain. Some supplements that may help for arthritis: Boswellia extract, curcumin, pycnogenol Aleve 1-2 tabs twice a day with food only if needed. Consider physical therapy to strengthen muscles around the joint that hurts to take pressure off of the joint itself. Cortisone injections are an option - we can consider SI joint injections if you're not improving.. It's important that you continue to stay active. Heat 15 minutes at a time 3-4 times a day as needed to help with pain. Water aerobics and cycling with low resistance are the best two types of exercise for arthritis though any exercise is ok as long as it doesn't worsen the pain. Follow up with me in 1 month.

## 2018-08-04 NOTE — Progress Notes (Signed)
PCP: [name], [name], MD  Consultation requested by Hale Bogus MD  Subjective:   HPI: Patient is a 78 y.o. female here for low back pain.  Patient reports she's had problems with bilateral low back pain for over a year. Pain stays in low back more on the right than the left. No radiation into legs. No bowel/bladder dysfunction. No numbness or tingling. Takes tylenol which helps some. Worse if she tries to be active. Pain is a soreness in lower back.  Past Medical History:  Diagnosis Date  . Arthritis   . Cancer ([place]) 12/07   right breast/tx with lumpectomy and tamoxifen  . Hypertension    under control  . Interstitial cystitis   . Osteopenia    no change    Current Outpatient Medications on File Prior to Visit  Medication Sig Dispense Refill  . Acetaminophen (TYLENOL ARTHRITIS PAIN PO) Take 650 mg by mouth as needed.    . Ascorbic Acid (VITAMIN C PO) Take 1,000 mg by mouth daily.     . Calcium-Vitamin D (CALTRATE 600 PLUS-VIT D PO) Take 600 mg by mouth daily.    . Cholecalciferol (VITAMIN D PO) Take 1,000 Units by mouth daily.     [name] loratadine (CLARITIN) 10 MG tablet Take 10 mg by mouth daily.    . Multiple Vitamins-Minerals (OCUVITE PO) Take 1 tablet by mouth daily.     . naproxen sodium (ANAPROX) 220 MG tablet Take 220 mg by mouth as needed.    [name] OVER THE COUNTER MEDICATION Take 1 tablet by mouth 2 (two) times daily. Cysta Q supplyment    . quinapril (ACCUPRIL) 20 MG tablet Take 20 mg by mouth 2 (two) times daily.    . RESTASIS 0.05 % ophthalmic emulsion INSTILL 1 DROP INTO BOTH EYES TWICE A DAY  6   No current facility-administered medications on file prior to visit.     Past Surgical History:  Procedure Laterality Date  . BILATERAL SALPINGOOPHORECTOMY  age 76s  . BREAST SURGERY Right    lumpectomy-multiple times  . BUNIONECTOMY WITH HAMMERTOE RECONSTRUCTION Bilateral ~2000  . CATARACT EXTRACTION Bilateral 2007  . KNEE SURGERY Bilateral as teen  .  TONSILLECTOMY AND ADENOIDECTOMY  age 64  . 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 TOTAL VAGINAL HYSTERECTOMY  age 2    Allergies  Allergen Reactions  . Codeine     Severe nausea.  Dizziness.    Social History   Socioeconomic History  . Marital status: Married    Spouse name: Not on file  . Number of children: Not on file  . Years of education: Not on file  . Highest education level: Not on file  Occupational History  . Not on file  Social Needs  . Financial resource strain: Not on file  . Food insecurity:    Worry: Not on file    Inability: Not on file  . Transportation needs:    Medical: Not on file    Non-medical: Not on file  Tobacco Use  . Smoking status: Never Smoker  . Smokeless tobacco: Never Used  Substance and Sexual Activity  . Alcohol use: No  . Drug use: No  . Sexual activity: Not Currently    Partners: Male    Birth control/protection: Surgical    Comment: TVH  Lifestyle  . Physical activity:  Days per week: Not on file    Minutes per session: Not on file  . Stress: Not on file  Relationships  . Social connections:    Talks on phone: Not on file    Gets together: Not on file    Attends religious service: Not on file    Active member of club or organization: Not on file    Attends meetings of clubs or organizations: Not on file    Relationship status: Not on file  . Intimate partner violence:    Fear of current or ex partner: Not on file    Emotionally abused: Not on file    Physically abused: Not on file    Forced sexual activity: Not on file  Other Topics Concern  . Not on file  Social History Narrative  . Not on file    Family History  Problem Relation Age of Onset  . Cancer Mother        breast/mastectomy  . Heart disease Mother        poor circulation  . Deep vein  thrombosis Mother   . Osteoporosis Mother   . Hypertension Father     BP (!) 154/84   Ht 5' 4.5" (1.638 m)   Wt 118 lb (53.5 kg)   LMP  (LMP Unknown)   BMI 19.94 kg/m   Review of Systems: See HPI above.     Objective:  Physical Exam:  Gen: NAD, comfortable in exam room  Back: Scoliotic curve.  No other deformity. TTP bilateral SI joints and lumbar paraspinal regions.  No midline or bony TTP. ROM 10 degrees extension, 70 flexion with mild pain on both. Strength LEs 5/5 all muscle groups.   2+ MSRs in patellar and achilles tendons, equal bilaterally. Negative SLRs. Sensation intact to light touch bilaterally.  Bilateral hips: No deformity. FROM with 5/5 strength. No tenderness to palpation. NVI distally. Negative logroll bilateral hips Mild pain fabers with mod limited motion bilaterally.  Negative piriformis.  Negative fadirs.   Assessment & Plan:  1. Low back, hip pain - 2/2 combination of arthritis, SI joint dysfunction, lumbar spasms.  Discussed tylenol, topical medications, supplements that may help, rare aleve.  Shown home exercises and stretches to do daily.  Consider physical therapy, SI joint injections.  Heat.  F/u in 1 month.

## 2018-08-31 ENCOUNTER — Ambulatory Visit: Payer: Medicare HMO | Admitting: Family Medicine

## 2018-09-09 ENCOUNTER — Ambulatory Visit: Payer: Medicare HMO | Admitting: Family Medicine

## 2018-09-09 VITALS — BP 143/67 | Ht 64.5 in | Wt 118.0 lb

## 2018-09-09 DIAGNOSIS — M545 Low back pain: Secondary | ICD-10-CM

## 2018-09-09 DIAGNOSIS — G8929 Other chronic pain: Secondary | ICD-10-CM | POA: Diagnosis not present

## 2018-09-09 DIAGNOSIS — M25551 Pain in right hip: Secondary | ICD-10-CM

## 2018-09-09 MED ORDER — METHYLPREDNISOLONE ACETATE 40 MG/ML IJ SUSP
40.0000 mg | Freq: Once | INTRAMUSCULAR | Status: AC
  Administered 2018-09-09: 40 mg via INTRA_ARTICULAR

## 2018-09-09 NOTE — Patient Instructions (Signed)
You were given a trochanteric bursa injection today. Continue your home exercises as you have been. Continue tylenol. Add side hip raises 3 sets of 10 once a day.  Add ankle weight if these become too easy. Let me know if you want to do physical therapy. If not improving and that area of your back continues to bother you a lot we can do an ultrasound guided SI joint injection. Follow up with me in 1 month.

## 2018-09-10 ENCOUNTER — Encounter: Payer: Self-pay | Admitting: Family Medicine

## 2018-09-10 NOTE — Progress Notes (Signed)
PCP: [name], [name], MD  Consultation requested by Hale Bogus MD  Subjective:   HPI: Patient is a 78 y.o. female here for low back pain.  1/7: Patient reports she's had problems with bilateral low back pain for over a year. Pain stays in low back more on the right than the left. No radiation into legs. No bowel/bladder dysfunction. No numbness or tingling. Takes tylenol which helps some. Worse if she tries to be active. Pain is a soreness in lower back.  2/13: Patient reports she feels about the same compared to last visit. Pain is a soreness that can be sharp on the right lateral hip and in the SI joint area. She has been doing her home exercises. She has stopped Aleve and takes 2 Tylenol in the morning with another before bed. Sometimes cannot keep her balance due to the soreness. Worse with motion of the right hip. No numbness or tingling and no radiation into the leg. No bowel or bladder dysfunction.  Past Medical History:  Diagnosis Date  . Arthritis   . Cancer ([place]) 12/07   right breast/tx with lumpectomy and tamoxifen  . Hypertension    under control  . Interstitial cystitis   . Osteopenia    no change    Current Outpatient Medications on File Prior to Visit  Medication Sig Dispense Refill  . Acetaminophen (TYLENOL ARTHRITIS PAIN PO) Take 650 mg by mouth as needed.    . Ascorbic Acid (VITAMIN C PO) Take 1,000 mg by mouth daily.     . Calcium-Vitamin D (CALTRATE 600 PLUS-VIT D PO) Take 600 mg by mouth daily.    . Cholecalciferol (VITAMIN D PO) Take 1,000 Units by mouth daily.     [name] loratadine (CLARITIN) 10 MG tablet Take 10 mg by mouth daily.    . Multiple Vitamins-Minerals (OCUVITE PO) Take 1 tablet by mouth daily.     . naproxen sodium (ANAPROX) 220 MG tablet Take 220 mg by mouth as needed.    [name] OVER THE COUNTER MEDICATION Take 1 tablet by mouth 2 (two) times daily. Cysta Q supplyment    . quinapril (ACCUPRIL) 20 MG tablet Take 20 mg by mouth 2 (two)  times daily.    . RESTASIS 0.05 % ophthalmic emulsion INSTILL 1 DROP INTO BOTH EYES TWICE A DAY  6   No current facility-administered medications on file prior to visit.     Past Surgical History:  Procedure Laterality Date  . BILATERAL SALPINGOOPHORECTOMY  age 62s  . BREAST SURGERY Right    lumpectomy-multiple times  . BUNIONECTOMY WITH HAMMERTOE RECONSTRUCTION Bilateral ~2000  . CATARACT EXTRACTION Bilateral 2007  . KNEE SURGERY Bilateral as teen  . TONSILLECTOMY AND ADENOIDECTOMY  age 40  . 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 TOTAL VAGINAL HYSTERECTOMY  age 60    Allergies  Allergen Reactions  . Codeine     Severe nausea.  Dizziness.    Social History   Socioeconomic History  . Marital status: Married    Spouse name: Not on file  . Number of children: Not on file  . Years of education: Not on file  . Highest education level: Not on file  Occupational History  . Not on file  Social Needs  . Financial resource strain: Not on file  . Food insecurity:    Worry: Not  on file    Inability: Not on file  . Transportation needs:    Medical: Not on file    Non-medical: Not on file  Tobacco Use  . Smoking status: Never Smoker  . Smokeless tobacco: Never Used  Substance and Sexual Activity  . Alcohol use: No  . Drug use: No  . Sexual activity: Not Currently    Partners: Male    Birth control/protection: Surgical    Comment: TVH  Lifestyle  . Physical activity:    Days per week: Not on file    Minutes per session: Not on file  . Stress: Not on file  Relationships  . Social connections:    Talks on phone: Not on file    Gets together: Not on file    Attends religious service: Not on file    Active member of club or organization: Not on file    Attends meetings of clubs or organizations: Not on file     Relationship status: Not on file  . Intimate partner violence:    Fear of current or ex partner: Not on file    Emotionally abused: Not on file    Physically abused: Not on file    Forced sexual activity: Not on file  Other Topics Concern  . Not on file  Social History Narrative  . Not on file    Family History  Problem Relation Age of Onset  . Cancer Mother        breast/mastectomy  . Heart disease Mother        poor circulation  . Deep vein thrombosis Mother   . Osteoporosis Mother   . Hypertension Father     BP (!) 143/67   Ht 5' 4.5" (1.638 m)   Wt 118 lb (53.5 kg)   LMP  (LMP Unknown)   BMI 19.94 kg/m   Review of Systems: See HPI above.     Objective:  Physical Exam:  Gen: NAD, comfortable in exam room  Back: Scoliotic curve.  No other deformity. TTP right SI joint, greater trochanter.  No paraspinal tenderness.  No midline or other bony TTP. ROM 10 degrees extension, 70 flexion with pain on extension > flexion. Strength LEs 5/5 all muscle groups except 4/5 right hip abduction.   2+ MSRs in patellar and achilles tendons, equal bilaterally. Negative SLRs. Sensation intact to light touch bilaterally.  Right hip: No deformity. FROM with 5/5 strength. Tenderness to palpation over greater trochanter, SI joint.  No other tenderness. NVI distally. Negative logroll Negative fabers and piriformis stretches.   Assessment & Plan:  1. Low back, hip pain -second to combination of arthritis, SI joint dysfunction, and trochanteric bursitis.  We gave her right trochanteric bursa injection today.  She was encouraged to continue her home exercises.  Continue Tylenol.  Add side hip raise exercises and showed how to progress these.  She will let us know she wants to do physical therapy.  We can consider ultrasound-guided SI joint injection on the right as well if this area continues to be a problem.  Follow-up in 1 month.  After informed written consent timeout was  performed, patient was lying on left side on exam table.  Area overlying right trochanteric bursa prepped with alcohol swab then utilizing ultrasound guidance was injected with 6:2 marcaine: depomedrol.  Patient tolerated procedure well without immediate complications.

## 2018-10-07 ENCOUNTER — Ambulatory Visit: Payer: Medicare HMO | Admitting: Family Medicine

## 2018-10-07 ENCOUNTER — Other Ambulatory Visit: Payer: Self-pay

## 2018-10-07 VITALS — BP 132/82 | Ht 65.0 in | Wt 118.0 lb

## 2018-10-07 DIAGNOSIS — M25551 Pain in right hip: Secondary | ICD-10-CM | POA: Diagnosis not present

## 2018-10-07 MED ORDER — METHYLPREDNISOLONE ACETATE 40 MG/ML IJ SUSP
40.0000 mg | Freq: Once | INTRAMUSCULAR | Status: AC
  Administered 2018-10-07: 40 mg via INTRA_ARTICULAR

## 2018-10-07 NOTE — Patient Instructions (Signed)
You were given an SI joint injection today. Continue your home exercises as you have been. Continue tylenol. Side hip raises 3 sets of 10 once a day.  Add ankle weight if these become too easy. Let me know if you want to do physical therapy. Follow up with me in 1 month.

## 2018-10-08 ENCOUNTER — Encounter: Payer: Self-pay | Admitting: Family Medicine

## 2018-10-08 NOTE — Progress Notes (Signed)
PCP: [name], [name], MD  Consultation requested by Hale Bogus MD  Subjective:   HPI: Patient is a 78 y.o. female here for low back pain.  1/7: Patient reports she's had problems with bilateral low back pain for over a year. Pain stays in low back more on the right than the left. No radiation into legs. No bowel/bladder dysfunction. No numbness or tingling. Takes tylenol which helps some. Worse if she tries to be active. Pain is a soreness in lower back.  2/13: Patient reports she feels about the same compared to last visit. Pain is a soreness that can be sharp on the right lateral hip and in the SI joint area. She has been doing her home exercises. She has stopped Aleve and takes 2 Tylenol in the morning with another before bed. Sometimes cannot keep her balance due to the soreness. Worse with motion of the right hip. No numbness or tingling and no radiation into the leg. No bowel or bladder dysfunction.  3/12: Patient reports her hip is much better following injection. She continues to do home exercises, yoga, tai chi. Taking otc tylenol as needed. Pain feels more focally to right side of low back, worse with ambulation. No skin changes, numbness.  Past Medical History:  Diagnosis Date  . Arthritis   . Cancer ([place]) 12/07   right breast/tx with lumpectomy and tamoxifen  . Hypertension    under control  . Interstitial cystitis   . Osteopenia    no change    Current Outpatient Medications on File Prior to Visit  Medication Sig Dispense Refill  . Acetaminophen (TYLENOL ARTHRITIS PAIN PO) Take 650 mg by mouth as needed.    . Ascorbic Acid (VITAMIN C PO) Take 1,000 mg by mouth daily.     . Calcium-Vitamin D (CALTRATE 600 PLUS-VIT D PO) Take 600 mg by mouth daily.    . Cholecalciferol (VITAMIN D PO) Take 1,000 Units by mouth daily.     [name] loratadine (CLARITIN) 10 MG tablet Take 10 mg by mouth daily.    . naproxen sodium (ANAPROX) 220 MG tablet Take 220 mg by mouth  as needed.    [name] OVER THE COUNTER MEDICATION Take 1 tablet by mouth 2 (two) times daily. Cysta Q supplyment    . quinapril (ACCUPRIL) 20 MG tablet Take 20 mg by mouth 2 (two) times daily.    . RESTASIS 0.05 % ophthalmic emulsion INSTILL 1 DROP INTO BOTH EYES TWICE A DAY  6  . Multiple Vitamins-Minerals (OCUVITE PO) Take 1 tablet by mouth daily.      No current facility-administered medications on file prior to visit.     Past Surgical History:  Procedure Laterality Date  . BILATERAL SALPINGOOPHORECTOMY  age 50s  . BREAST SURGERY Right    lumpectomy-multiple times  . BUNIONECTOMY WITH HAMMERTOE RECONSTRUCTION Bilateral ~2000  . CATARACT EXTRACTION Bilateral 2007  . KNEE SURGERY Bilateral as teen  . TONSILLECTOMY AND ADENOIDECTOMY  age 78  . 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 TOTAL VAGINAL HYSTERECTOMY  age 55    Allergies  Allergen Reactions  . Codeine     Severe nausea.  Dizziness.    Social History   Socioeconomic History  . Marital status: Married    Spouse name: Not on file  . Number of children: Not on file  .  Years of education: Not on file  . Highest education level: Not on file  Occupational History  . Not on file  Social Needs  . Financial resource strain: Not on file  . Food insecurity:    Worry: Not on file    Inability: Not on file  . Transportation needs:    Medical: Not on file    Non-medical: Not on file  Tobacco Use  . Smoking status: Never Smoker  . Smokeless tobacco: Never Used  Substance and Sexual Activity  . Alcohol use: No  . Drug use: No  . Sexual activity: Not Currently    Partners: Male    Birth control/protection: Surgical    Comment: TVH  Lifestyle  . Physical activity:    Days per week: Not on file    Minutes per session: Not on file  . Stress: Not on file   Relationships  . Social connections:    Talks on phone: Not on file    Gets together: Not on file    Attends religious service: Not on file    Active member of club or organization: Not on file    Attends meetings of clubs or organizations: Not on file    Relationship status: Not on file  . Intimate partner violence:    Fear of current or ex partner: Not on file    Emotionally abused: Not on file    Physically abused: Not on file    Forced sexual activity: Not on file  Other Topics Concern  . Not on file  Social History Narrative  . Not on file    Family History  Problem Relation Age of Onset  . Cancer Mother        breast/mastectomy  . Heart disease Mother        poor circulation  . Deep vein thrombosis Mother   . Osteoporosis Mother   . Hypertension Father     BP 132/82   Ht 5' 5"  (1.651 m)   Wt 118 lb (53.5 kg)   LMP  (LMP Unknown)   BMI 19.64 kg/m   Review of Systems: See HPI above.     Objective:  Physical Exam:  Gen: NAD, comfortable in exam room  Back: Scoliotic curve.  No other deformity. TTP right SI joints, mildly greater trochanter.  No paraspinal tenderness.  No midline or other bony TTP. ROM 10 degrees extension, 70 flexion. Strength LEs 5/5 all muscle groups except 5-/5 right hip abduction. Negative SLR. Negative logrolls. Mild pain with faber, negative fadir.   Assessment & Plan:  1. Low back, hip pain - trochanteric bursitis improved.  Pain primarily due to SI joint dysfunction.  Discussed options - she would like to try injection here which was performed today.  Continue home exercises and stretches.  Tylenol as needed.  Consider physical therapy if not improving.  After informed written consent timeout was performed.  Patient was lying prone on exam table.  Area overlying right SI joint was prepped with alcohol swab then utilizing ultrasound guidance patient's right SI joint was injected with 3:1 bupivicaine: depomedrol.  Patient tolerated  procedure well without immediate complications.

## 2018-11-11 ENCOUNTER — Ambulatory Visit: Payer: Medicare HMO | Admitting: Family Medicine

## 2018-11-18 DIAGNOSIS — M859 Disorder of bone density and structure, unspecified: Secondary | ICD-10-CM | POA: Diagnosis not present

## 2018-11-18 DIAGNOSIS — E7800 Pure hypercholesterolemia, unspecified: Secondary | ICD-10-CM | POA: Diagnosis not present

## 2018-11-18 DIAGNOSIS — I10 Essential (primary) hypertension: Secondary | ICD-10-CM | POA: Diagnosis not present

## 2018-11-23 ENCOUNTER — Ambulatory Visit: Payer: Medicare HMO | Admitting: Family Medicine

## 2018-11-23 DIAGNOSIS — I10 Essential (primary) hypertension: Secondary | ICD-10-CM | POA: Diagnosis not present

## 2018-11-23 DIAGNOSIS — R82998 Other abnormal findings in urine: Secondary | ICD-10-CM | POA: Diagnosis not present

## 2018-11-25 DIAGNOSIS — Z853 Personal history of malignant neoplasm of breast: Secondary | ICD-10-CM | POA: Diagnosis not present

## 2018-11-25 DIAGNOSIS — I452 Bifascicular block: Secondary | ICD-10-CM | POA: Diagnosis not present

## 2018-11-25 DIAGNOSIS — Z0000 Encounter for general adult medical examination without abnormal findings: Secondary | ICD-10-CM | POA: Diagnosis not present

## 2018-11-25 DIAGNOSIS — I10 Essential (primary) hypertension: Secondary | ICD-10-CM | POA: Diagnosis not present

## 2018-11-25 DIAGNOSIS — M8580 Other specified disorders of bone density and structure, unspecified site: Secondary | ICD-10-CM | POA: Diagnosis not present

## 2018-11-25 DIAGNOSIS — M545 Low back pain: Secondary | ICD-10-CM | POA: Diagnosis not present

## 2018-11-25 DIAGNOSIS — Z1339 Encounter for screening examination for other mental health and behavioral disorders: Secondary | ICD-10-CM | POA: Diagnosis not present

## 2018-11-25 DIAGNOSIS — M1990 Unspecified osteoarthritis, unspecified site: Secondary | ICD-10-CM | POA: Diagnosis not present

## 2018-11-25 DIAGNOSIS — E7800 Pure hypercholesterolemia, unspecified: Secondary | ICD-10-CM | POA: Diagnosis not present

## 2018-11-25 DIAGNOSIS — N3010 Interstitial cystitis (chronic) without hematuria: Secondary | ICD-10-CM | POA: Diagnosis not present

## 2018-12-07 ENCOUNTER — Ambulatory Visit: Payer: Medicare HMO | Admitting: Family Medicine

## 2018-12-07 ENCOUNTER — Other Ambulatory Visit: Payer: Self-pay

## 2018-12-07 ENCOUNTER — Encounter: Payer: Self-pay | Admitting: Family Medicine

## 2018-12-07 VITALS — BP 152/77 | Ht 65.5 in | Wt 116.0 lb

## 2018-12-07 DIAGNOSIS — M545 Low back pain, unspecified: Secondary | ICD-10-CM

## 2018-12-07 DIAGNOSIS — M79671 Pain in right foot: Secondary | ICD-10-CM | POA: Diagnosis not present

## 2018-12-07 MED ORDER — NAPROXEN 500 MG PO TABS: 500.0000 mg | ORAL_TABLET | Freq: Two times a day (BID) | ORAL | 0 refills | Status: DC | PRN

## 2018-12-07 NOTE — Patient Instructions (Signed)
Wear the boot when up and walking around for the next 6 weeks. Ok to take this off to ice it, use topical medications. Ok to wrap your foot under the boot also. Tylenol 500mg  1-2 tabs three times a day. Naproxen 500mg  twice a day with food for pain and inflammation - take for about 7 days then as needed. We will go ahead with an MRI of your lumbar spine to assess for spinal stenosis. Avoid any extension activities as we discussed. We will consider physical therapy for an extension based program, modalities depending on the MRI results. Follow up with me via phone or webex in 2 weeks about the foot.

## 2018-12-07 NOTE — Progress Notes (Signed)
PCP: [name], [name], MD  Consultation requested by Hale Bogus MD  Subjective:   HPI: Patient is a 78 y.o. female here for low back pain.  1/7: Patient reports she's had problems with bilateral low back pain for over a year. Pain stays in low back more on the right than the left. No radiation into legs. No bowel/bladder dysfunction. No numbness or tingling. Takes tylenol which helps some. Worse if she tries to be active. Pain is a soreness in lower back.  2/13: Patient reports she feels about the same compared to last visit. Pain is a soreness that can be sharp on the right lateral hip and in the SI joint area. She has been doing her home exercises. She has stopped Aleve and takes 2 Tylenol in the morning with another before bed. Sometimes cannot keep her balance due to the soreness. Worse with motion of the right hip. No numbness or tingling and no radiation into the leg. No bowel or bladder dysfunction.  3/12: Patient reports her hip is much better following injection. She continues to do home exercises, yoga, tai chi. Taking otc tylenol as needed. Pain feels more focally to right side of low back, worse with ambulation. No skin changes, numbness.  5/12: Patient returns reporting she's continued to have pain posterior hips and low back, now more on the left side. Didn't feel like SI joint injection provided much benefit on the right. Radiation of pain into both sides, sometimes down back of left leg past the knee. No skin changes, numbness. Also with pain on dorsolateral right foot. Can barely put weight on this right foot - pain up to 10/10 and sharp knifelike. Has history of osteopenia but no history of stress fracture. Has been icing, resting, taking tylenol, doing epsom salt soaks, topical aspercreme and biofreeze. No skin changes, numbness. Worse with ambulation.  Past Medical History:  Diagnosis Date  . Arthritis   . Cancer ([place]) 12/07   right breast/tx  with lumpectomy and tamoxifen  . Hypertension    under control  . Interstitial cystitis   . Osteopenia    no change    Current Outpatient Medications on File Prior to Visit  Medication Sig Dispense Refill  . Acetaminophen (TYLENOL ARTHRITIS PAIN PO) Take 650 mg by mouth as needed.    . Ascorbic Acid (VITAMIN C PO) Take 1,000 mg by mouth daily.     . Calcium-Vitamin D (CALTRATE 600 PLUS-VIT D PO) Take 600 mg by mouth daily.    . Cholecalciferol (VITAMIN D PO) Take 1,000 Units by mouth daily.     [name] loratadine (CLARITIN) 10 MG tablet Take 10 mg by mouth daily.    . Multiple Vitamins-Minerals (OCUVITE PO) Take 1 tablet by mouth daily.     . naproxen sodium (ANAPROX) 220 MG tablet Take 220 mg by mouth as needed.    [name] OVER THE COUNTER MEDICATION Take 1 tablet by mouth 2 (two) times daily. Cysta Q supplyment    . quinapril (ACCUPRIL) 20 MG tablet Take 20 mg by mouth 2 (two) times daily.    . RESTASIS 0.05 % ophthalmic emulsion INSTILL 1 DROP INTO BOTH EYES TWICE A DAY  6   No current facility-administered medications on file prior to visit.     Past Surgical History:  Procedure Laterality Date  . BILATERAL SALPINGOOPHORECTOMY  age 34s  . BREAST SURGERY Right    lumpectomy-multiple times  . BUNIONECTOMY WITH HAMMERTOE RECONSTRUCTION Bilateral ~2000  . CATARACT EXTRACTION Bilateral  2007  . KNEE SURGERY Bilateral as teen  . TONSILLECTOMY AND ADENOIDECTOMY  age 57  . 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 TOTAL VAGINAL HYSTERECTOMY  age 77    Allergies  Allergen Reactions  . Codeine     Severe nausea.  Dizziness.    Social History   Socioeconomic History  . Marital status: Married    Spouse name: Not on file  . Number of children: Not on file  . Years of education: Not on file  . Highest education level: Not on file   Occupational History  . Not on file  Social Needs  . Financial resource strain: Not on file  . Food insecurity:    Worry: Not on file    Inability: Not on file  . Transportation needs:    Medical: Not on file    Non-medical: Not on file  Tobacco Use  . Smoking status: Never Smoker  . Smokeless tobacco: Never Used  Substance and Sexual Activity  . Alcohol use: No  . Drug use: No  . Sexual activity: Not Currently    Partners: Male    Birth control/protection: Surgical    Comment: TVH  Lifestyle  . Physical activity:    Days per week: Not on file    Minutes per session: Not on file  . Stress: Not on file  Relationships  . Social connections:    Talks on phone: Not on file    Gets together: Not on file    Attends religious service: Not on file    Active member of club or organization: Not on file    Attends meetings of clubs or organizations: Not on file    Relationship status: Not on file  . Intimate partner violence:    Fear of current or ex partner: Not on file    Emotionally abused: Not on file    Physically abused: Not on file    Forced sexual activity: Not on file  Other Topics Concern  . Not on file  Social History Narrative  . Not on file    Family History  Problem Relation Age of Onset  . Cancer Mother        breast/mastectomy  . Heart disease Mother        poor circulation  . Deep vein thrombosis Mother   . Osteoporosis Mother   . Hypertension Father     BP (!) 152/77   Ht 5' 5.5" (1.664 m)   Wt 116 lb (52.6 kg)   LMP  (LMP Unknown)   BMI 19.01 kg/m   Review of Systems: See HPI above.     Objective:  Physical Exam:  Gen: NAD, comfortable in exam room  Back: Scoliotic curve. TTP bilateral paraspinal lumbar regions, L > R.  No midline or bony TTP. ROM 70 degrees flexion, 10 extension. Strength LEs 5/5 all muscle groups except 4/5 with bilateral hip flexion.   2+ MSRs in patellar and achilles tendons, equal bilaterally. Negative  SLRs. Sensation intact to light touch bilaterally.  Right hip: No deformity. FROM with 5/5 strength except 4/5 with hip flexion. No tenderness to palpation. NVI distally. Negative logroll   Left hip: No deformity. FROM with 5/5 strength except 4/5 with hip flexion. No tenderness to palpation. NVI distally. Negative logroll.  Right foot/ankle: No gross deformity, swelling, ecchymoses FROM  with 5/5 strength all ankle motions. TTP proximal 4th and 5th metatarsals.  No other tenderness. Negative ant drawer and talar tilt.   Negative syndesmotic compression. Pain with metatarsal squeeze. Thompsons test negative. NV intact distally.  Left foot/ankle: No deformity. FROM with 5/5 strength. No tenderness to palpation. NVI distally.  MSK u/s right foot: Small cortical irregularity noted proximal fifth metatarsal with minimal overlying edema.  No other abnormalities 4th, 5th metatarsals.  Assessment & Plan:  1. Right foot pain - consistent with early stress fracture 5th metatarsal.  Discussed high risk of nonunion with these types of fracture and encouraged non weight bearing to heal this but concern if she uses knee scooter or crutches this could put her at risk of falling, sustaining additional injuries.  Cam walker, tylenol, naproxen.  Icing.   2. Low back pain with radiation into both hips - concerning that she's not improved with typical exercises, right SI joint and trochanteric bursa injection.  Most likely due to spinal stenosis with radiation into both hips, down left leg posteriorly.  Will go ahead with MRI to further assess.

## 2018-12-09 ENCOUNTER — Telehealth: Payer: Self-pay | Admitting: Internal Medicine

## 2018-12-09 NOTE — Telephone Encounter (Signed)
Her toe started hurting about a week ago.  She has been researching and is wondering if she has gout?  Can you call and speak to her about it?  Phone [phone].  She wants to know if she needs additional medication for this, or will what you gave her work for that?

## 2018-12-13 ENCOUNTER — Ambulatory Visit: Payer: Medicare HMO | Admitting: Family Medicine

## 2018-12-13 ENCOUNTER — Encounter: Payer: Self-pay | Admitting: Family Medicine

## 2018-12-13 ENCOUNTER — Other Ambulatory Visit: Payer: Self-pay

## 2018-12-13 VITALS — BP 154/99 | HR 72 | Temp 98.1°F | Ht 64.0 in | Wt 116.0 lb

## 2018-12-13 DIAGNOSIS — M79674 Pain in right toe(s): Secondary | ICD-10-CM | POA: Diagnosis not present

## 2018-12-13 MED ORDER — COLCHICINE 0.6 MG PO TABS: 0.6000 mg | ORAL_TABLET | Freq: Every day | ORAL | 0 refills | Status: DC

## 2018-12-13 NOTE — Patient Instructions (Addendum)
Continue naproxen and tylenol. Take colchicine - 1 tablet with dinner, take it twice tomorrow, then once a day after this. Let me know how you're doing tomorrow afternoon or the next day. Continue with the boot. Ok to take this off to ice it, use topical medications. Ok to wrap your foot under the boot also. Get labwork upstairs (CBC with diff, uric acid). Follow up will depend on your labs and how you improve with treatment.

## 2018-12-13 NOTE — Telephone Encounter (Signed)
Spoke with patient - some swelling, redness of great toe base that's waking her at night - she's going to come in this afternoon to evaluate.  No fevers, chills, sweats, injury.  Wearing her cam walker.

## 2018-12-13 NOTE — Progress Notes (Signed)
PCP: Haywood Pao, MD  Subjective:   HPI: Patient is a 78 y.o. female here for right great toe pain.  Patient presents with pain in her right great toe for approximately 6 days.  4 days ago it significantly worsened and woke her up from sleep.  She reports 8/10 pain.  Her pain waxes and wanes and seems to flare daily.  It does improve slightly with Tylenol.  She denies any significant swelling.  Does report slight erythema.  Her pain pulses and radiates into the toe.  When her pain is flared, it is exacerbated even by light touch.  No numbness or tingling.  She denies any fevers or chills.  She denies any history of gout.  Past Medical History:  Diagnosis Date  . Arthritis   . Cancer ([place]) 12/07   right breast/tx with lumpectomy and tamoxifen  . Hypertension    under control  . Interstitial cystitis   . Osteopenia    no change    Current Outpatient Medications on File Prior to Visit  Medication Sig Dispense Refill  . Acetaminophen (TYLENOL ARTHRITIS PAIN PO) Take 650 mg by mouth as needed.    . Ascorbic Acid (VITAMIN C PO) Take 1,000 mg by mouth daily.     . Calcium-Vitamin D (CALTRATE 600 PLUS-VIT D PO) Take 600 mg by mouth daily.    . Cholecalciferol (VITAMIN D PO) Take 1,000 Units by mouth daily.     [name] loratadine (CLARITIN) 10 MG tablet Take 10 mg by mouth daily.    . Multiple Vitamins-Minerals (OCUVITE PO) Take 1 tablet by mouth daily.     . naproxen (NAPROSYN) 500 MG tablet Take 1 tablet (500 mg total) by mouth 2 (two) times daily as needed. 60 tablet 0  . naproxen sodium (ANAPROX) 220 MG tablet Take 220 mg by mouth as needed.    [name] OVER THE COUNTER MEDICATION Take 1 tablet by mouth 2 (two) times daily. Cysta Q supplyment    . quinapril (ACCUPRIL) 20 MG tablet Take 20 mg by mouth 2 (two) times daily.    . RESTASIS 0.05 % ophthalmic emulsion INSTILL 1 DROP INTO BOTH EYES TWICE A DAY  6   No current facility-administered medications on file prior to visit.     Past  Surgical History:  Procedure Laterality Date  . BILATERAL SALPINGOOPHORECTOMY  age 44s  . BREAST SURGERY Right    lumpectomy-multiple times  . BUNIONECTOMY WITH HAMMERTOE RECONSTRUCTION Bilateral ~2000  . CATARACT EXTRACTION Bilateral 2007  . KNEE SURGERY Bilateral as teen  . TONSILLECTOMY AND ADENOIDECTOMY  age 33  . 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 TOTAL VAGINAL HYSTERECTOMY  age 41    Allergies  Allergen Reactions  . Codeine     Severe nausea.  Dizziness.    Social History   Socioeconomic History  . Marital status: Married    Spouse name: Not on file  . Number of children: Not on file  . Years of education: Not on file  . Highest education level: Not on file  Occupational History  . Not on file  Social Needs  . Financial resource strain: Not on file  . Food insecurity:    Worry: Not on file    Inability: Not on file  . Transportation needs:    Medical: Not on file  Non-medical: Not on file  Tobacco Use  . Smoking status: Never Smoker  . Smokeless tobacco: Never Used  Substance and Sexual Activity  . Alcohol use: No  . Drug use: No  . Sexual activity: Not Currently    Partners: Male    Birth control/protection: Surgical    Comment: TVH  Lifestyle  . Physical activity:    Days per week: Not on file    Minutes per session: Not on file  . Stress: Not on file  Relationships  . Social connections:    Talks on phone: Not on file    Gets together: Not on file    Attends religious service: Not on file    Active member of club or organization: Not on file    Attends meetings of clubs or organizations: Not on file    Relationship status: Not on file  . Intimate partner violence:    Fear of current or ex partner: Not on file    Emotionally abused: Not on file    Physically abused: Not on file     Forced sexual activity: Not on file  Other Topics Concern  . Not on file  Social History Narrative  . Not on file    Family History  Problem Relation Age of Onset  . Cancer Mother        breast/mastectomy  . Heart disease Mother        poor circulation  . Deep vein thrombosis Mother   . Osteoporosis Mother   . Hypertension Father     Ht 5\' 4"  (1.626 m)   Wt 116 lb (52.6 kg)   LMP  (LMP Unknown)   BMI 19.91 kg/m   Review of Systems: See HPI above.     Objective:  Physical Exam:  Gen: awake, alert, NAD, comfortable in exam room Pulm: breathing unlabored  Right foot: Inspection:  No obvious bony deformity.  No swelling.  Very mild erythema at the first MTP.  No bruising bruising.  Well-healed incision from prior bunionectomy Palpation: Minimal tenderness to palpation of first MTP.  Minimal pain with axial percussion ROM: Full ROM  Strength: 5/5 strength Neurovascular: N/V intact distally in the lower extremity  MSK [place]: Limited ultrasound of the right great toe reveals trace fluid within first MTP joint, physiologic appearing amount.  Mild arthritic changes seen.  Left foot: No obvious deformity.  Well-healed incision from prior bunionectomy.  No swelling erythema No tenderness Full range of motion with 5/5 strength N/V intact distally   Assessment & Plan:  1.  Right great toe pain- symptoms are likely related to a gout flare although she has no history of previous gout flares.  Given the extreme sensitivity to light touch and worsening pain on with activity, doubt underlying OA as a cause.  Would expect more persistent symptoms with a septic arthritis. - colchicine for acute tx - continue tylenol as needed - CMC, uric acid - f/u by phone in next 1-2 days

## 2018-12-14 ENCOUNTER — Encounter: Payer: Self-pay | Admitting: Family Medicine

## 2018-12-14 LAB — CBC WITH DIFFERENTIAL/PLATELET
Basophils Absolute: 0 10*3/uL (ref 0.0–0.2)
Basos: 0 %
EOS (ABSOLUTE): 0.1 10*3/uL (ref 0.0–0.4)
Eos: 1 %
Hematocrit: 39.5 % (ref 34.0–46.6)
Hemoglobin: 14.3 g/dL (ref 11.1–15.9)
Immature Grans (Abs): 0 10*3/uL (ref 0.0–0.1)
Immature Granulocytes: 0 %
Lymphocytes Absolute: 1.4 10*3/uL (ref 0.7–3.1)
Lymphs: 19 %
MCH: 34.6 pg — ABNORMAL HIGH (ref 26.6–33.0)
MCHC: 36.2 g/dL — ABNORMAL HIGH (ref 31.5–35.7)
MCV: 96 fL (ref 79–97)
Monocytes Absolute: 0.8 10*3/uL (ref 0.1–0.9)
Monocytes: 11 %
Neutrophils Absolute: 4.9 10*3/uL (ref 1.4–7.0)
Neutrophils: 69 %
Platelets: 239 10*3/uL (ref 150–450)
RBC: 4.13 x10E6/uL (ref 3.77–5.28)
RDW: 11.9 % (ref 11.7–15.4)
WBC: 7.2 10*3/uL (ref 3.4–10.8)

## 2018-12-14 LAB — URIC ACID: Uric Acid: 5 mg/dL (ref 2.5–7.1)

## 2018-12-15 ENCOUNTER — Telehealth: Payer: Self-pay | Admitting: Family Medicine

## 2018-12-15 NOTE — Telephone Encounter (Signed)
Ok, great. Thanks

## 2018-12-15 NOTE — Telephone Encounter (Signed)
Patient calling with an update. States she has not had any flare ups in her toe. States she will continue to take the medication prescribed until Monday

## 2018-12-17 ENCOUNTER — Ambulatory Visit
Admission: RE | Admit: 2018-12-17 | Discharge: 2018-12-17 | Disposition: A | Discharge status: Home / Self Care | Payer: Medicare HMO | Source: Ambulatory Visit | Attending: Family Medicine | Admitting: Family Medicine

## 2018-12-17 ENCOUNTER — Other Ambulatory Visit: Payer: Self-pay

## 2018-12-17 DIAGNOSIS — M48061 Spinal stenosis, lumbar region without neurogenic claudication: Secondary | ICD-10-CM | POA: Diagnosis not present

## 2018-12-17 DIAGNOSIS — M545 Low back pain, unspecified: Secondary | ICD-10-CM

## 2018-12-21 ENCOUNTER — Other Ambulatory Visit: Payer: Self-pay

## 2018-12-21 ENCOUNTER — Encounter: Payer: Self-pay | Admitting: Family Medicine

## 2018-12-21 ENCOUNTER — Ambulatory Visit (INDEPENDENT_AMBULATORY_CARE_PROVIDER_SITE_OTHER): Payer: Medicare HMO | Admitting: Family Medicine

## 2018-12-21 VITALS — Ht 64.0 in | Wt 116.0 lb

## 2018-12-21 DIAGNOSIS — M545 Low back pain, unspecified: Secondary | ICD-10-CM

## 2018-12-21 DIAGNOSIS — M79674 Pain in right toe(s): Secondary | ICD-10-CM | POA: Diagnosis not present

## 2018-12-21 NOTE — Progress Notes (Signed)
Virtual Visit via Telephone Note  I connected with Doris Ford on 12/21/18 at 11:30 AM EDT by telephone and verified that I am speaking with the correct person using two identifiers.  Location: Patient: home Provider: [place]   I discussed the limitations, risks, security and privacy concerns of performing an evaluation and management service by telephone and the availability of in person appointments. I also discussed with the patient that there may be a patient responsible charge related to this service. The patient expressed understanding and agreed to proceed.   History of Present Illness: Patient reports she did well the first two days with colchicine and tylenol but pain has been worse again since Wednesday with sharp pain waking her up early in the morning. No fevers, chills, sweats, redness. Her back pain into left buttock has improved but still present.   Observations/Objective: NAD, no respiratory distress.  Assessment and Plan: Right great toe pain - will see in the office tomorrow for trial of 1st MTP injection.  We also discussed consideration of prednisone dose pack but with her stress fracture of 5th metatarsal and increased risk of infection in current pandemic will try injection first.  Low back pain radiating to left hip - MRI reviewed and discussed with patient.  Current focus on more painful right great toe.  She does have area at L4-5 that is likely causing her pain with radiculopathy.    I discussed the assessment and treatment plan with the patient. The patient was provided an opportunity to ask questions and all were answered. The patient agreed with the plan and demonstrated an understanding of the instructions.   The patient was advised to call back or seek an in-person evaluation if the symptoms worsen or if the condition fails to improve as anticipated.  I provided 10 minutes of non-face-to-face time during this encounter from 1131  to 1141am.   Karlton Lemon, MD

## 2018-12-22 ENCOUNTER — Encounter: Payer: Self-pay | Admitting: Family Medicine

## 2018-12-22 ENCOUNTER — Ambulatory Visit (INDEPENDENT_AMBULATORY_CARE_PROVIDER_SITE_OTHER): Payer: Medicare HMO | Admitting: Family Medicine

## 2018-12-22 VITALS — BP 173/83 | HR 77 | Ht 64.0 in | Wt 116.0 lb

## 2018-12-22 DIAGNOSIS — M79674 Pain in right toe(s): Secondary | ICD-10-CM

## 2018-12-22 MED ORDER — METHYLPREDNISOLONE ACETATE 40 MG/ML IJ SUSP
20.0000 mg | Freq: Once | INTRAMUSCULAR | Status: AC
  Administered 2018-12-22: 20 mg via INTRA_ARTICULAR

## 2018-12-25 ENCOUNTER — Encounter: Payer: Self-pay | Admitting: Family Medicine

## 2018-12-25 NOTE — Progress Notes (Signed)
Patient returned today to trial right 1st MTP injection with ultrasound guidance as discussed in prior visit.    After informed written consent patient was seated in exam room.  Area overlying right 1st MTP prepped with alcohol swab.  Then utilizing ultrasound guidance patient's right 1st MTP was injected with 0.5:0.77mL bupivicaine: depomedrol.  Patient tolerated procedure well without immediate complications.

## 2018-12-27 ENCOUNTER — Telehealth: Payer: Self-pay | Admitting: Family Medicine

## 2018-12-27 MED ORDER — TRAMADOL HCL 50 MG PO TABS: 50.0000 mg | ORAL_TABLET | Freq: Three times a day (TID) | ORAL | 0 refills | Status: DC | PRN

## 2018-12-27 NOTE — Telephone Encounter (Signed)
The shot should have kicked in by now.    I think we need to do an MRI of her foot - Nevin Bloodgood can you make sure the MRI windows for this include the base of her big toe (1st MTP)?  She should continue tylenol, naproxen, and aspercreme.  We could do a steroid dose pack by mouth or a low dose narcotic pain medicine like tramadol.  I'd prefer she do the tramadol as needed for pain instead of the steroid if she's amenable to this while we wait on the MRI.

## 2018-12-27 NOTE — Telephone Encounter (Signed)
Patient wants to do the Tramadol. Please send to [facility].

## 2018-12-27 NOTE — Telephone Encounter (Signed)
Patient calling to report on toe pain. States she has not noticed much difference in the pain since she had the injection on 5/27. Stated 3 of the past 5 days she has had severe pain. She is still taking naproxen 2x a day, colchicine 1x a day, and tylenol 3x a day.   Patient asked how long does it take for the injection to start working and what else can she do for the pain.

## 2018-12-27 NOTE — Telephone Encounter (Signed)
Ok sent this in for her - let her know she can start with half a tablet to make sure she tolerates this ok.  It's different from codeine (which gave her nausea) so she should do ok with it.

## 2018-12-27 NOTE — Telephone Encounter (Signed)
Send Tramadol to the [facility] and gave her information.

## 2018-12-29 NOTE — Addendum Note (Signed)
Addended by: Sherrie George F on: 12/29/2018 03:58 PM   Modules accepted: Orders

## 2019-01-03 ENCOUNTER — Other Ambulatory Visit: Payer: Self-pay

## 2019-01-03 ENCOUNTER — Ambulatory Visit (HOSPITAL_COMMUNITY)
Admission: RE | Admit: 2019-01-03 | Discharge: 2019-01-03 | Disposition: A | Discharge status: Home / Self Care | Payer: Medicare HMO | Source: Ambulatory Visit | Attending: Family Medicine | Admitting: Family Medicine

## 2019-01-03 ENCOUNTER — Telehealth: Payer: Self-pay | Admitting: Family Medicine

## 2019-01-03 DIAGNOSIS — S92351A Displaced fracture of fifth metatarsal bone, right foot, initial encounter for closed fracture: Secondary | ICD-10-CM | POA: Diagnosis not present

## 2019-01-03 DIAGNOSIS — M79674 Pain in right toe(s): Secondary | ICD-10-CM | POA: Insufficient documentation

## 2019-01-03 NOTE — Telephone Encounter (Signed)
Patient called stating she is still in a lot of pain. She is asking if she needs to be referred to another type of specialist.   She is aware we are working on getting her MRI approved.

## 2019-01-03 NOTE — Addendum Note (Signed)
Addended by: Sherrie George F on: 01/03/2019 11:30 AM   Modules accepted: Orders

## 2019-01-03 NOTE — Telephone Encounter (Signed)
The MRI will dictate where we go next and if she needs to see a surgeon or other specialist.  What's the status of the MRI approval?

## 2019-01-04 ENCOUNTER — Other Ambulatory Visit: Payer: Self-pay | Admitting: Family Medicine

## 2019-01-04 NOTE — Telephone Encounter (Signed)
Patient's MRI has been scheduled for June 17th.

## 2019-01-04 NOTE — Telephone Encounter (Signed)
Ok sounds good.  She could call and let them know if there's a cancellation to let her know, possibly get in faster.

## 2019-01-06 ENCOUNTER — Other Ambulatory Visit: Payer: Self-pay | Admitting: Family Medicine

## 2019-01-10 ENCOUNTER — Other Ambulatory Visit: Payer: Self-pay

## 2019-01-10 ENCOUNTER — Ambulatory Visit (INDEPENDENT_AMBULATORY_CARE_PROVIDER_SITE_OTHER): Payer: Medicare HMO

## 2019-01-10 DIAGNOSIS — S92354A Nondisplaced fracture of fifth metatarsal bone, right foot, initial encounter for closed fracture: Secondary | ICD-10-CM | POA: Diagnosis not present

## 2019-01-10 DIAGNOSIS — M79674 Pain in right toe(s): Secondary | ICD-10-CM | POA: Diagnosis not present

## 2019-01-10 NOTE — Addendum Note (Signed)
Addended by: Sherrie George F on: 01/10/2019 10:27 AM   Modules accepted: Orders

## 2019-01-11 NOTE — Addendum Note (Signed)
Addended by: Sherrie George F on: 01/11/2019 11:45 AM   Modules accepted: Orders

## 2019-01-12 ENCOUNTER — Ambulatory Visit (HOSPITAL_COMMUNITY): Payer: Medicare HMO

## 2019-01-14 DIAGNOSIS — M898X7 Other specified disorders of bone, ankle and foot: Secondary | ICD-10-CM | POA: Diagnosis not present

## 2019-01-14 DIAGNOSIS — S92301A Fracture of unspecified metatarsal bone(s), right foot, initial encounter for closed fracture: Secondary | ICD-10-CM | POA: Diagnosis not present

## 2019-02-09 ENCOUNTER — Telehealth: Payer: Self-pay | Admitting: Family Medicine

## 2019-02-09 DIAGNOSIS — M79674 Pain in right toe(s): Secondary | ICD-10-CM | POA: Diagnosis not present

## 2019-02-09 DIAGNOSIS — M898X7 Other specified disorders of bone, ankle and foot: Secondary | ICD-10-CM | POA: Diagnosis not present

## 2019-02-09 MED ORDER — NAPROXEN 500 MG PO TABS: 500.0000 mg | ORAL_TABLET | Freq: Two times a day (BID) | ORAL | 1 refills | Status: DC | PRN

## 2019-02-09 NOTE — Telephone Encounter (Signed)
Patient requesting refill of Naproxen   Pharmacy: [facility]

## 2019-02-09 NOTE — Telephone Encounter (Signed)
Refilled this for her with 1 additional refill.

## 2019-03-16 DIAGNOSIS — M79674 Pain in right toe(s): Secondary | ICD-10-CM | POA: Diagnosis not present

## 2019-04-08 DIAGNOSIS — Z23 Encounter for immunization: Secondary | ICD-10-CM | POA: Diagnosis not present

## 2019-05-20 DIAGNOSIS — M545 Low back pain: Secondary | ICD-10-CM | POA: Diagnosis not present

## 2019-05-20 DIAGNOSIS — M546 Pain in thoracic spine: Secondary | ICD-10-CM | POA: Diagnosis not present

## 2019-05-20 DIAGNOSIS — M542 Cervicalgia: Secondary | ICD-10-CM | POA: Diagnosis not present

## 2019-05-20 DIAGNOSIS — M9903 Segmental and somatic dysfunction of lumbar region: Secondary | ICD-10-CM | POA: Diagnosis not present

## 2019-05-20 DIAGNOSIS — M9901 Segmental and somatic dysfunction of cervical region: Secondary | ICD-10-CM | POA: Diagnosis not present

## 2019-05-20 DIAGNOSIS — M9902 Segmental and somatic dysfunction of thoracic region: Secondary | ICD-10-CM | POA: Diagnosis not present

## 2019-05-23 DIAGNOSIS — M542 Cervicalgia: Secondary | ICD-10-CM | POA: Diagnosis not present

## 2019-05-23 DIAGNOSIS — M9903 Segmental and somatic dysfunction of lumbar region: Secondary | ICD-10-CM | POA: Diagnosis not present

## 2019-05-23 DIAGNOSIS — M546 Pain in thoracic spine: Secondary | ICD-10-CM | POA: Diagnosis not present

## 2019-05-23 DIAGNOSIS — M9901 Segmental and somatic dysfunction of cervical region: Secondary | ICD-10-CM | POA: Diagnosis not present

## 2019-05-23 DIAGNOSIS — M9902 Segmental and somatic dysfunction of thoracic region: Secondary | ICD-10-CM | POA: Diagnosis not present

## 2019-05-23 DIAGNOSIS — M545 Low back pain: Secondary | ICD-10-CM | POA: Diagnosis not present

## 2019-05-25 DIAGNOSIS — M546 Pain in thoracic spine: Secondary | ICD-10-CM | POA: Diagnosis not present

## 2019-05-25 DIAGNOSIS — M545 Low back pain: Secondary | ICD-10-CM | POA: Diagnosis not present

## 2019-05-25 DIAGNOSIS — M542 Cervicalgia: Secondary | ICD-10-CM | POA: Diagnosis not present

## 2019-05-25 DIAGNOSIS — M9903 Segmental and somatic dysfunction of lumbar region: Secondary | ICD-10-CM | POA: Diagnosis not present

## 2019-05-25 DIAGNOSIS — M9901 Segmental and somatic dysfunction of cervical region: Secondary | ICD-10-CM | POA: Diagnosis not present

## 2019-05-25 DIAGNOSIS — M9902 Segmental and somatic dysfunction of thoracic region: Secondary | ICD-10-CM | POA: Diagnosis not present

## 2019-05-31 ENCOUNTER — Inpatient Hospital Stay (HOSPITAL_COMMUNITY)
Admission: EM | Admit: 2019-05-31 | Discharge: 2019-06-03 | DRG: 378 | Disposition: A | Discharge status: Home / Self Care | Payer: Medicare HMO | Attending: Family Medicine | Admitting: Family Medicine

## 2019-05-31 ENCOUNTER — Other Ambulatory Visit: Payer: Self-pay

## 2019-05-31 ENCOUNTER — Encounter (HOSPITAL_COMMUNITY): Payer: Self-pay | Admitting: Emergency Medicine

## 2019-05-31 ENCOUNTER — Encounter (HOSPITAL_COMMUNITY)
Admission: EM | Disposition: A | Discharge status: Home / Self Care | Payer: Self-pay | Source: Home / Self Care | Attending: Family Medicine

## 2019-05-31 DIAGNOSIS — Z8262 Family history of osteoporosis: Secondary | ICD-10-CM

## 2019-05-31 DIAGNOSIS — K222 Esophageal obstruction: Secondary | ICD-10-CM | POA: Diagnosis not present

## 2019-05-31 DIAGNOSIS — K922 Gastrointestinal hemorrhage, unspecified: Secondary | ICD-10-CM | POA: Diagnosis not present

## 2019-05-31 DIAGNOSIS — I959 Hypotension, unspecified: Secondary | ICD-10-CM

## 2019-05-31 DIAGNOSIS — K250 Acute gastric ulcer with hemorrhage: Secondary | ICD-10-CM

## 2019-05-31 DIAGNOSIS — Z791 Long term (current) use of non-steroidal anti-inflammatories (NSAID): Secondary | ICD-10-CM

## 2019-05-31 DIAGNOSIS — Z9841 Cataract extraction status, right eye: Secondary | ICD-10-CM | POA: Diagnosis not present

## 2019-05-31 DIAGNOSIS — R000 Tachycardia, unspecified: Secondary | ICD-10-CM | POA: Diagnosis not present

## 2019-05-31 DIAGNOSIS — K254 Chronic or unspecified gastric ulcer with hemorrhage: Principal | ICD-10-CM | POA: Diagnosis present

## 2019-05-31 DIAGNOSIS — Z79891 Long term (current) use of opiate analgesic: Secondary | ICD-10-CM | POA: Diagnosis not present

## 2019-05-31 DIAGNOSIS — Z23 Encounter for immunization: Secondary | ICD-10-CM

## 2019-05-31 DIAGNOSIS — D62 Acute posthemorrhagic anemia: Secondary | ICD-10-CM | POA: Diagnosis present

## 2019-05-31 DIAGNOSIS — Z96641 Presence of right artificial hip joint: Secondary | ICD-10-CM | POA: Diagnosis present

## 2019-05-31 DIAGNOSIS — M8580 Other specified disorders of bone density and structure, unspecified site: Secondary | ICD-10-CM | POA: Diagnosis present

## 2019-05-31 DIAGNOSIS — K920 Hematemesis: Secondary | ICD-10-CM

## 2019-05-31 DIAGNOSIS — Z885 Allergy status to narcotic agent status: Secondary | ICD-10-CM | POA: Diagnosis not present

## 2019-05-31 DIAGNOSIS — Z90710 Acquired absence of both cervix and uterus: Secondary | ICD-10-CM

## 2019-05-31 DIAGNOSIS — Z79899 Other long term (current) drug therapy: Secondary | ICD-10-CM | POA: Diagnosis not present

## 2019-05-31 DIAGNOSIS — Z9842 Cataract extraction status, left eye: Secondary | ICD-10-CM | POA: Diagnosis not present

## 2019-05-31 DIAGNOSIS — Z803 Family history of malignant neoplasm of breast: Secondary | ICD-10-CM

## 2019-05-31 DIAGNOSIS — I10 Essential (primary) hypertension: Secondary | ICD-10-CM | POA: Diagnosis not present

## 2019-05-31 DIAGNOSIS — K449 Diaphragmatic hernia without obstruction or gangrene: Secondary | ICD-10-CM | POA: Diagnosis not present

## 2019-05-31 DIAGNOSIS — Z20828 Contact with and (suspected) exposure to other viral communicable diseases: Secondary | ICD-10-CM | POA: Diagnosis not present

## 2019-05-31 DIAGNOSIS — Z8249 Family history of ischemic heart disease and other diseases of the circulatory system: Secondary | ICD-10-CM

## 2019-05-31 DIAGNOSIS — Z853 Personal history of malignant neoplasm of breast: Secondary | ICD-10-CM

## 2019-05-31 HISTORY — PX: ESOPHAGOGASTRODUODENOSCOPY: SHX5428

## 2019-05-31 HISTORY — PX: BIOPSY: SHX5522

## 2019-05-31 LAB — PROTIME-INR
INR: 1.1 (ref 0.8–1.2)
Prothrombin Time: 13.8 seconds (ref 11.4–15.2)

## 2019-05-31 LAB — CBC WITH DIFFERENTIAL/PLATELET
Abs Immature Granulocytes: 0.04 10*3/uL (ref 0.00–0.07)
Basophils Absolute: 0 10*3/uL (ref 0.0–0.1)
Basophils Relative: 0 %
Eosinophils Absolute: 0 10*3/uL (ref 0.0–0.5)
Eosinophils Relative: 0 %
HCT: 38.8 % (ref 36.0–46.0)
Hemoglobin: 12.6 g/dL (ref 12.0–15.0)
Immature Granulocytes: 0 %
Lymphocytes Relative: 7 %
Lymphs Abs: 1 10*3/uL (ref 0.7–4.0)
MCH: 33.2 pg (ref 26.0–34.0)
MCHC: 32.5 g/dL (ref 30.0–36.0)
MCV: 102.1 fL — ABNORMAL HIGH (ref 80.0–100.0)
Monocytes Absolute: 0.6 10*3/uL (ref 0.1–1.0)
Monocytes Relative: 5 %
Neutro Abs: 11.5 10*3/uL — ABNORMAL HIGH (ref 1.7–7.7)
Neutrophils Relative %: 88 %
Platelets: 299 10*3/uL (ref 150–400)
RBC: 3.8 MIL/uL — ABNORMAL LOW (ref 3.87–5.11)
RDW: 11.9 % (ref 11.5–15.5)
WBC: 13.1 10*3/uL — ABNORMAL HIGH (ref 4.0–10.5)
nRBC: 0 % (ref 0.0–0.2)

## 2019-05-31 LAB — COMPREHENSIVE METABOLIC PANEL
ALT: 20 U/L (ref 0–44)
AST: 26 U/L (ref 15–41)
Albumin: 4 g/dL (ref 3.5–5.0)
Alkaline Phosphatase: 48 U/L (ref 38–126)
Anion gap: 11 (ref 5–15)
BUN: 64 mg/dL — ABNORMAL HIGH (ref 8–23)
CO2: 25 mmol/L (ref 22–32)
Calcium: 9.2 mg/dL (ref 8.9–10.3)
Chloride: 102 mmol/L (ref 98–111)
Creatinine, Ser: 0.73 mg/dL (ref 0.44–1.00)
GFR calc Af Amer: >60 mL/min (ref >60)
GFR calc non Af Amer: >60 mL/min (ref >60)
Glucose, Bld: 166 mg/dL — ABNORMAL HIGH (ref 70–99)
Potassium: 4.1 mmol/L (ref 3.5–5.1)
Sodium: 138 mmol/L (ref 135–145)
Total Bilirubin: 0.5 mg/dL (ref 0.3–1.2)
Total Protein: 6.8 g/dL (ref 6.5–8.1)

## 2019-05-31 LAB — URINALYSIS, ROUTINE W REFLEX MICROSCOPIC
Bacteria, UA: NONE SEEN
Bilirubin Urine: NEGATIVE
Glucose, UA: NEGATIVE mg/dL
Ketones, ur: 20 mg/dL — AB
Leukocytes,Ua: NEGATIVE
Nitrite: NEGATIVE
Protein, ur: NEGATIVE mg/dL
Specific Gravity, Urine: 1.016 (ref 1.005–1.030)
pH: 5 (ref 5.0–8.0)

## 2019-05-31 LAB — TYPE AND SCREEN
ABO/RH(D): O POS
Antibody Screen: NEGATIVE

## 2019-05-31 LAB — HEMOGLOBIN AND HEMATOCRIT, BLOOD
HCT: 33.4 % — ABNORMAL LOW (ref 36.0–46.0)
HCT: 30.7 % — ABNORMAL LOW (ref 36.0–46.0)
Hemoglobin: 10.7 g/dL — ABNORMAL LOW (ref 12.0–15.0)
Hemoglobin: 9.9 g/dL — ABNORMAL LOW (ref 12.0–15.0)

## 2019-05-31 LAB — LIPASE, BLOOD: Lipase: 36 U/L (ref 11–51)

## 2019-05-31 LAB — SARS CORONAVIRUS 2 BY RT PCR (HOSPITAL ORDER, PERFORMED IN ~~LOC~~ HOSPITAL LAB): SARS Coronavirus 2: NEGATIVE

## 2019-05-31 LAB — POC OCCULT BLOOD, ED: Fecal Occult Bld: POSITIVE — AB

## 2019-05-31 SURGERY — EGD (ESOPHAGOGASTRODUODENOSCOPY)
Anesthesia: Moderate Sedation

## 2019-05-31 MED ORDER — ACETAMINOPHEN 325 MG PO TABS: 650.0000 mg | ORAL_TABLET | Freq: Four times a day (QID) | ORAL | Status: DC | PRN

## 2019-05-31 MED ORDER — SODIUM CHLORIDE 0.9 % IV SOLN
INTRAVENOUS | Status: DC
  Administered 2019-05-31 – 2019-06-02 (×3): via INTRAVENOUS

## 2019-05-31 MED ORDER — SODIUM CHLORIDE 0.9 % IV BOLUS
500.0000 mL | Freq: Once | INTRAVENOUS | Status: AC
  Administered 2019-05-31: 12:00:00 500 mL via INTRAVENOUS

## 2019-05-31 MED ORDER — LIDOCAINE VISCOUS HCL 2 % MT SOLN
OROMUCOSAL | Status: AC
  Filled 2019-05-31: qty 15

## 2019-05-31 MED ORDER — SODIUM CHLORIDE (PF) 0.9 % IJ SOLN
PREFILLED_SYRINGE | INTRAMUSCULAR | Status: DC | PRN
  Administered 2019-05-31: 7 mL

## 2019-05-31 MED ORDER — CYCLOSPORINE 0.05 % OP EMUL
1.0000 [drp] | Freq: Two times a day (BID) | OPHTHALMIC | Status: DC
  Administered 2019-05-31 – 2019-06-03 (×6): 1 [drp] via OPHTHALMIC
  Filled 2019-05-31 (×6): qty 30

## 2019-05-31 MED ORDER — ONDANSETRON HCL 4 MG/2ML IJ SOLN
INTRAMUSCULAR | Status: AC
  Filled 2019-05-31: qty 2

## 2019-05-31 MED ORDER — PANTOPRAZOLE SODIUM 40 MG IV SOLR
80.0000 mg | Freq: Once | INTRAVENOUS | Status: AC
  Administered 2019-05-31: 80 mg via INTRAVENOUS
  Filled 2019-05-31: qty 80

## 2019-05-31 MED ORDER — ACETAMINOPHEN 650 MG RE SUPP: 650.0000 mg | Freq: Four times a day (QID) | RECTAL | Status: DC | PRN

## 2019-05-31 MED ORDER — SODIUM CHLORIDE 0.9 % IV SOLN
INTRAVENOUS | Status: DC
  Administered 2019-05-31: 15:00:00 via INTRAVENOUS

## 2019-05-31 MED ORDER — TRAZODONE HCL 50 MG PO TABS: 50.0000 mg | ORAL_TABLET | Freq: Every evening | ORAL | Status: DC | PRN

## 2019-05-31 MED ORDER — PANTOPRAZOLE SODIUM 40 MG IV SOLR: 40.0000 mg | Freq: Two times a day (BID) | INTRAVENOUS | Status: DC

## 2019-05-31 MED ORDER — ONDANSETRON HCL 4 MG PO TABS: 4.0000 mg | ORAL_TABLET | Freq: Four times a day (QID) | ORAL | Status: DC | PRN

## 2019-05-31 MED ORDER — SODIUM CHLORIDE 0.9 % IV SOLN
8.0000 mg/h | INTRAVENOUS | Status: DC
  Administered 2019-05-31 – 2019-06-03 (×7): 8 mg/h via INTRAVENOUS
  Filled 2019-05-31 (×10): qty 80

## 2019-05-31 MED ORDER — POLYETHYLENE GLYCOL 3350 17 G PO PACK: 17.0000 g | PACK | Freq: Every day | ORAL | Status: DC | PRN

## 2019-05-31 MED ORDER — LORATADINE 10 MG PO TABS
10.0000 mg | ORAL_TABLET | Freq: Every day | ORAL | Status: DC
  Administered 2019-06-01 – 2019-06-03 (×3): 10 mg via ORAL
  Filled 2019-05-31 (×3): qty 1

## 2019-05-31 MED ORDER — MEPERIDINE HCL 50 MG/ML IJ SOLN
INTRAMUSCULAR | Status: AC
  Filled 2019-05-31: qty 1

## 2019-05-31 MED ORDER — MEPERIDINE HCL 100 MG/ML IJ SOLN
INTRAMUSCULAR | Status: DC | PRN
  Administered 2019-05-31: 25 mg via INTRAVENOUS
  Administered 2019-05-31: 15 mg via INTRAVENOUS

## 2019-05-31 MED ORDER — SODIUM CHLORIDE 0.9 % IV SOLN: 250.0000 mL | INTRAVENOUS | Status: DC | PRN

## 2019-05-31 MED ORDER — LIDOCAINE VISCOUS HCL 2 % MT SOLN
OROMUCOSAL | Status: DC | PRN
  Administered 2019-05-31: 1 via OROMUCOSAL

## 2019-05-31 MED ORDER — SODIUM CHLORIDE 0.9% FLUSH: 3.0000 mL | INTRAVENOUS | Status: DC | PRN

## 2019-05-31 MED ORDER — MIDAZOLAM HCL 5 MG/5ML IJ SOLN
INTRAMUSCULAR | Status: AC
  Filled 2019-05-31: qty 10

## 2019-05-31 MED ORDER — ONDANSETRON HCL 4 MG/2ML IJ SOLN: 4.0000 mg | Freq: Four times a day (QID) | INTRAMUSCULAR | Status: DC | PRN

## 2019-05-31 MED ORDER — SODIUM CHLORIDE 0.9% FLUSH
3.0000 mL | Freq: Two times a day (BID) | INTRAVENOUS | Status: DC
  Administered 2019-05-31 – 2019-06-03 (×2): 3 mL via INTRAVENOUS

## 2019-05-31 MED ORDER — MIDAZOLAM HCL 5 MG/5ML IJ SOLN
INTRAMUSCULAR | Status: DC | PRN
  Administered 2019-05-31 (×3): 1 mg via INTRAVENOUS

## 2019-05-31 MED ORDER — EPINEPHRINE 1 MG/10ML IJ SOSY
PREFILLED_SYRINGE | INTRAMUSCULAR | Status: AC
  Filled 2019-05-31: qty 10

## 2019-05-31 MED ORDER — ONDANSETRON HCL 4 MG/2ML IJ SOLN
INTRAMUSCULAR | Status: DC | PRN
  Administered 2019-05-31: 4 mg via INTRAVENOUS

## 2019-05-31 MED ORDER — ALBUTEROL SULFATE (2.5 MG/3ML) 0.083% IN NEBU: 2.5000 mg | INHALATION_SOLUTION | RESPIRATORY_TRACT | Status: DC | PRN

## 2019-05-31 NOTE — ED Triage Notes (Signed)
Patient complaining of vomiting bright red blood and black stools this morning.

## 2019-05-31 NOTE — Consult Note (Signed)
Referring Provider: Nanda Quinton, MD Primary Care Physician:  Haywood Pao, MD Primary Gastroenterologist:  Dr. Gala Romney   Date of Admission: Patient has not been admitted yet. Consult to admit has been placed. Will likely be admitted after EGD today.  Date of Consultation: 05/31/19  Reason for Consultation:  GI bleed with hypotension  HPI:  Doris Ford is a 78 y.o. year old female with no significant past GI medical history who presented to the ED today with hematemesis and melena. Hypotensive and tachycardic on arrival. Improved slightly with fluids.   This morning she woke up to urinate around 7am. When she raised up, she was lightheaded. She went to the rest room and began vomiting in the waste basket. Vomit was bright red. She returned to bed. When waking up again around 9 am, she was lightheaded and felt like she had to have a BM. She had soft mushy stool, not watery. Stool was black.  She then presented to the ED. No further vomiting or BMs.  No bright red blood per rectum.  No abdominal pain. No acid reflux or heartburn. No dysphagia. No nausea.  Never experienced anything like this before.  BMs are typically daily to every other day, soft and formed.  Had been taking Naproxen 500 mg for stress fracture in her right foot starting in July. None since August. Was taking BID with meals x 1 month then once a day with meals x 1 month. Has only been taking Tylenol since.  Denies any ibuprofen, Aleve, Advil, or Goody powder use.  States she almost passed out at home. Feels like she would be lightheaded if she sat up. No chest pain, heart palpitations, shortness of breath, or cough.   Last ate at 6pm last night. Had one swallow of Dr. Malachi Bonds and one swallow of ginger ale around 10am.  No tramadol in the last 3-4 years.  Took this when she had her hip replaced. Thinks her prior colonoscopies were with the [place]. States her primary care keeps up with this. Not sure when last one was.  Doesn't think she ever had polyps.   Past Medical History:  Diagnosis Date  . Arthritis   . Cancer ([place]) 12/07   right breast/tx with lumpectomy and tamoxifen  . Hypertension    under control  . Interstitial cystitis   . Osteopenia    no change    Past Surgical History:  Procedure Laterality Date  . BILATERAL SALPINGOOPHORECTOMY  age 44s  . BREAST SURGERY Right    lumpectomy-multiple times  . BUNIONECTOMY WITH HAMMERTOE RECONSTRUCTION Bilateral ~2000  . CATARACT EXTRACTION Bilateral 2007  . KNEE SURGERY Bilateral as teen  . TONSILLECTOMY AND ADENOIDECTOMY  age 32  . 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 TOTAL VAGINAL HYSTERECTOMY  age 73    Prior to Admission medications   Medication Sig Start Date End Date Taking? Authorizing Provider  Acetaminophen (TYLENOL ARTHRITIS PAIN PO) Take 650 mg by mouth as needed.    [provider]  Ascorbic Acid (VITAMIN C PO) Take 1,000 mg by mouth daily.     [provider]  Calcium-Vitamin D (CALTRATE 600 PLUS-VIT D PO) Take 600 mg by mouth daily.    [provider]  Cholecalciferol (VITAMIN D PO) Take 1,000 Units by mouth daily.     [provider]  colchicine 0.6 MG tablet Take 1 tablet (0.6 mg total) by mouth daily. 12/13/18   Hudnall, Sharyn Lull, MD  hydrochlorothiazide (HYDRODIURIL) 12.5 MG tablet Take 1 tablet by mouth every morning. 12/28/18   [provider]  loratadine (CLARITIN) 10 MG tablet Take 10 mg by mouth daily.    [provider]  Multiple Vitamins-Minerals (OCUVITE PO) Take 1 tablet by mouth daily.     [provider]  naproxen (NAPROSYN) 500 MG tablet Take 1 tablet (500 mg total) by mouth 2 (two) times daily as needed. 02/09/19   Hudnall, Sharyn Lull, MD  naproxen sodium (ANAPROX) 220 MG tablet Take 220 mg by  mouth as needed.    [provider]  OVER THE COUNTER MEDICATION Take 1 tablet by mouth 2 (two) times daily. Cysta Q supplyment    [provider]  quinapril (ACCUPRIL) 20 MG tablet Take 20 mg by mouth 2 (two) times daily.    [provider]  RESTASIS 0.05 % ophthalmic emulsion INSTILL 1 DROP INTO BOTH EYES TWICE A DAY 12/11/14   [provider]  traMADol (ULTRAM) 50 MG tablet TAKE 1 TABLET BY MOUTH EVERY 8 HOURS AS NEEDED. 01/04/19   Hudnall, Sharyn Lull, MD    No current facility-administered medications for this encounter.    Current Outpatient Medications  Medication Sig Dispense Refill  . Acetaminophen (TYLENOL ARTHRITIS PAIN PO) Take 650 mg by mouth as needed.    . Ascorbic Acid (VITAMIN C PO) Take 1,000 mg by mouth daily.     . Calcium-Vitamin D (CALTRATE 600 PLUS-VIT D PO) Take 600 mg by mouth daily.    . Cholecalciferol (VITAMIN D PO) Take 1,000 Units by mouth daily.     . colchicine 0.6 MG tablet Take 1 tablet (0.6 mg total) by mouth daily. 30 tablet 0  . hydrochlorothiazide (HYDRODIURIL) 12.5 MG tablet Take 1 tablet by mouth every morning.    . loratadine (CLARITIN) 10 MG tablet Take 10 mg by mouth daily.    . Multiple Vitamins-Minerals (OCUVITE PO) Take 1 tablet by mouth daily.     . naproxen (NAPROSYN) 500 MG tablet Take 1 tablet (500 mg total) by mouth 2 (two) times daily as needed. 60 tablet 1  . naproxen sodium (ANAPROX) 220 MG tablet Take 220 mg by mouth as needed.    [name] OVER THE COUNTER MEDICATION Take 1 tablet by mouth 2 (two) times daily. Cysta Q supplyment    . quinapril (ACCUPRIL) 20 MG tablet Take 20 mg by mouth 2 (two) times daily.    . RESTASIS 0.05 % ophthalmic emulsion INSTILL 1 DROP INTO BOTH EYES TWICE A DAY  6  . traMADol (ULTRAM) 50 MG tablet TAKE 1 TABLET BY MOUTH EVERY 8 HOURS AS NEEDED. 20 tablet 0    Allergies as of 05/31/2019 - Review Complete 05/31/2019  Allergen Reaction Noted  . Codeine  01/10/2014    Family History   Problem Relation Age of Onset  . Cancer Mother        breast/mastectomy  . Heart disease Mother        poor circulation  . Deep vein thrombosis Mother   . Osteoporosis Mother   . Hypertension Father     Social History   Socioeconomic History  . Marital status: Married    Spouse name: Not on file  . Number of children: Not on file  . Years of education: Not on file  . Highest education level: Not on file  Occupational  History  . Not on file  Social Needs  . Financial resource strain: Not on file  . Food insecurity    Worry: Not on file    Inability: Not on file  . Transportation needs    Medical: Not on file    Non-medical: Not on file  Tobacco Use  . Smoking status: Never Smoker  . Smokeless tobacco: Never Used  Substance and Sexual Activity  . Alcohol use: No  . Drug use: No  . Sexual activity: Not Currently    Partners: Male    Birth control/protection: Surgical    Comment: TVH  Lifestyle  . Physical activity    Days per week: Not on file    Minutes per session: Not on file  . Stress: Not on file  Relationships  . Social Herbalist on phone: Not on file    Gets together: Not on file    Attends religious service: Not on file    Active member of club or organization: Not on file    Attends meetings of clubs or organizations: Not on file    Relationship status: Not on file  . Intimate partner violence    Fear of current or ex partner: Not on file    Emotionally abused: Not on file    Physically abused: Not on file    Forced sexual activity: Not on file  Other Topics Concern  . Not on file  Social History Narrative  . Not on file    Review of Systems: Gen: Denies fever, chills CV: See HPI Resp: See HPI GI: See HPI GU : Denies urinary burning, urinary frequency, urinary incontinence.  MS: Denies joint pain Derm: Denies rash Psych: Denies depression, anxiety Heme: See HPI   Physical Exam: Vital signs in last 24 hours: Temp:  [98.4 F  (36.9 C)] 98.4 F (36.9 C) (11/03 1127) Pulse Rate:  [106-126] 106 (11/03 1204) Resp:  [20-25] 23 (11/03 1204) BP: (83-140)/(72-76) 140/76 (11/03 1204) SpO2:  [97 %-98 %] 97 % (11/03 1204) Weight:  [53.5 kg] 53.5 kg (11/03 1129)   General:   Alert,  Well-developed, well-nourished, pleasant and cooperative in NAD Head:  Normocephalic and atraumatic. Eyes:  Sclera clear, no icterus.   Conjunctiva pink. Ears:  Normal auditory acuity. Nose:  No deformity, discharge,  or lesions. Mouth:  No deformity or lesions, dentition normal. Neck:  Supple; no masses or thyromegaly. Lungs:  Clear throughout to auscultation.   No wheezes, crackles, or rhonchi. No acute distress. Heart:  Regular rate and rhythm; no murmurs, clicks, rubs,  or gallops. Abdomen:  Soft, nontender and nondistended. No masses, hepatosplenomegaly or hernias noted. Normal bowel sounds, without guarding, and without rebound.   Rectal:  Deferred until time of colonoscopy.   Msk:  Symmetrical without gross deformities. Normal posture. Pulses:  Normal pulses noted. Extremities:  Without clubbing or edema. Neurologic:  Alert and  oriented x4;  grossly normal neurologically. Skin:  Intact without significant lesions or rashes. Cervical Nodes:  No significant cervical adenopathy. Psych:  Alert and cooperative. Normal mood and affect.   Lab Results: Recent Labs    05/31/19 1204  WBC 13.1*  HGB 12.6  HCT 38.8  PLT 299   BMET Recent Labs    05/31/19 1204  NA 138  K 4.1  CL 102  CO2 25  GLUCOSE 166*  BUN 64*  CREATININE 0.73  CALCIUM 9.2   LFT Recent Labs    05/31/19 1204  PROT  6.8  ALBUMIN 4.0  AST 26  ALT 20  ALKPHOS 48  BILITOT 0.5   PT/INR Recent Labs    05/31/19 1204  LABPROT 13.8  INR 1.1    Impression: 78 year old female with no significant past GI history who presented to the ED on 04/30/2019 after 1 episode of hematemesis and melena.  Upon presentation to the ED, she was hypotensive with BP  83/72 and tachycardic with HR 126.  She received 500 mL bolus of fluid and IV Protonix 80 mg bolus.  Blood pressure and tachycardia improved. HR remains in the 90s.  No further nausea, vomiting, or BM since this morning.  FOBT positive.  Hemoglobin 12.6. Starting in July, she had been taking naproxen 500 mg twice daily x1 month then daily x1 month.  No naproxen or other NSAIDs since August.  Not on any blood thinners.  No history of GI bleed.  Denies abdominal pain, heartburn, acid reflux, or other significant upper or lower GI symptoms.   DDx: Suspect upper GI bleed with possible NSAID induced gastritis, esophagitis, duodenitis, or PUD.   Patient has not had anything to eat since 6 PM yesterday.  Has only had a couple sips of soda today, last at 10 AM.  Discussed with Dr. Buford Dresser. Will proceed with EGD today for further evaluation of hematemesis, melena, and positive FOBT.  Further recommendations to follow.  Plan: Proceed with upper endoscopy today with Dr. Gala Romney. The risks, benefits, and alternatives have been discussed in detail with patient. They have stated understanding and desire to proceed.  Further recommendations to follow.    LOS: 0 days    05/31/2019, 1:10 PM   Aliene Altes, [facility]

## 2019-05-31 NOTE — Op Note (Signed)
[facility] Patient Name: Doris Ford Procedure Date: 05/31/2019 2:19 PM MRN: [id number] Date of Birth: September 04, 1940 Attending MD: Norvel Richards , MD CSN: [id number] Age: 78 Admit Type: Outpatient Procedure:                Upper GI endoscopy Indications:              Hematemesis, Melena Providers:                Norvel Richards, MD, Charlsie Quest. Theda Sers RN, RN,                            Aram Candela Referring MD:              Medicines:                Meperidine 40 mg IV, Midazolam 3 mg IV, Propofol                            per Anesthesia, Ondansetron 4 mg IV Complications:            No immediate complications. Estimated Blood Loss:     Estimated blood loss was minimal. Procedure:                Pre-Anesthesia Assessment:                           - Prior to the procedure, a History and Physical                            was performed, and patient medications and                            allergies were reviewed. The patient's tolerance of                            previous anesthesia was also reviewed. The risks                            and benefits of the procedure and the sedation                            options and risks were discussed with the patient.                            All questions were answered, and informed consent                            was obtained. Prior Anticoagulants: The patient has                            taken no previous anticoagulant or antiplatelet                            agents. ASA Grade Assessment: III - A patient with  severe systemic disease. After reviewing the risks                            and benefits, the patient was deemed in                            satisfactory condition to undergo the procedure.                           After obtaining informed consent, the endoscope was                            passed under direct vision. Throughout the   procedure, the patient's blood pressure, pulse, and                            oxygen saturations were monitored continuously. The                            [id number]) scope was introduced through the                            mouth, and advanced to the second part of duodenum.                            The upper GI endoscopy was accomplished without                            difficulty. The patient tolerated the procedure                            well. Scope In: 2:55:46 PM Scope Out: 3:11:37 PM Total Procedure Duration: 0 hours 15 minutes 51 seconds  Findings:      A widely patent Schatzki ring was found at the gastroesophageal junction.      One non-bleeding cratered gastric ulcer with adherent clot/pigmented       protuberance was found in the gastric antrum. The lesion was 10 mm in       largest dimension. Area was successfully circumferentially injected with       7 mL of a 1:10,000 solution of epinephrine for improved access (by       lifting the lesion prior to destruction). Utilizing 7 French gold probe       at 20 J each. Multiple applications were employed to seal the vessel in       the base of the crater. Estimated blood loss was minimal. Patient had       scattered patchy gastric erythema and mottling of the mucosa. No       infiltrating process seen. Biopsies of the mucosa taken for of H. pylori       testing Impression:               - Widely patent Schatzki ring. Small hiatal hernia.                           - Non-bleeding gastric ulcer with adherent clot.  Injected/ thermally sealed..                           -Status post gastric mucosal biopsy Moderate Sedation:      Moderate (conscious) sedation was administered by the endoscopy nurse       and supervised by the endoscopist. The following parameters were       monitored: oxygen saturation, heart rate, blood pressure, respiratory       rate, EKG, adequacy of pulmonary  ventilation, and response to care.      Moderate (conscious) sedation was administered by the endoscopy nurse       and supervised by the endoscopist. The following parameters were       monitored: oxygen saturation, heart rate, blood pressure, respiratory       rate, EKG, adequacy of pulmonary ventilation, and response to care.       Total physician intraservice time was 27 minutes. Recommendation:           - Patient has a contact number available for                            emergencies. The signs and symptoms of potential                            delayed complications were discussed with the                            patient. Return to normal activities tomorrow.                            Written discharge instructions were provided to the                            patient.                           - Advance diet as tolerated. IV PPI infusion x72                            hours. Clear liquid diet. Trend H&H. Follow-up on                            pathology. Avoid NSAIDs.                           - Return patient to hospital ward for ongoing care. Procedure Code(s):        --- Professional ---                           [id number], Esophagogastroduodenoscopy, flexible,                            transoral; with directed submucosal injection(s),                            any substance  [id number], Moderate sedation; each additional 15                            minutes intraservice time                           G0500, Moderate sedation services provided by the                            same physician or other qualified health care                            professional performing a gastrointestinal                            endoscopic service that sedation supports,                            requiring the presence of an independent trained                            observer to assist in the monitoring of the                            patient's level of  consciousness and physiological                            status; initial 15 minutes of intra-service time;                            patient age 36 years or older (additional time may                            be reported with [phone], as appropriate) Diagnosis Code(s):        --- Professional ---                           K22.2, Esophageal obstruction                           K25.4, Chronic or unspecified gastric ulcer with                            hemorrhage                           K92.0, Hematemesis                           K92.1, Melena (includes Hematochezia) CPT copyright 2019 [employer]. All rights reserved. The codes documented in this report are preliminary and upon coder review may  be revised to meet current compliance requirements. Cristopher Estimable. Elliannah Wayment, MD Norvel Richards, MD 05/31/2019 3:23:37 PM This report has been signed electronically. Number of Addenda: 0

## 2019-05-31 NOTE — ED Provider Notes (Signed)
Emergency Department Provider Note   I have reviewed the triage vital signs and the nursing notes.   HISTORY  Chief Complaint Hematemesis   HPI Doris Ford is a 78 y.o. female with no prior history of GI bleeding presents to the ED with acute onset hematemesis and black bowel movements this AM. Patient has been taking Naproxen for foot pain but no anticoagulation. She woke up this AM for urination and then felt acutely lightheaded and nauseated. She had one episode of vomiting dark red blood. She felt weak and returned to bed. She got up again and felt abdominal cramping at which point she had black diarrhea x 1. She presented to the ED at that time.    Past Medical History:  Diagnosis Date  . Arthritis   . Cancer ([place]) 12/07   right breast/tx with lumpectomy and tamoxifen  . Hypertension    under control  . Interstitial cystitis   . Osteopenia    no change    Patient Active Problem List   Diagnosis Date Noted  . GI bleed 05/31/2019  . Gastric ulcer with hemorrhage 05/31/2019  . HTN (hypertension) 05/31/2019  . Arthritis of right hip 12/16/2013  . 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 Chronic interstitial cystitis 11/26/2011    Past Surgical History:  Procedure Laterality Date  . BILATERAL SALPINGOOPHORECTOMY  age 19s  . BREAST SURGERY Right    lumpectomy-multiple times  . BUNIONECTOMY WITH HAMMERTOE RECONSTRUCTION Bilateral ~2000  . CATARACT EXTRACTION Bilateral 2007  . KNEE SURGERY Bilateral as teen  . TONSILLECTOMY AND ADENOIDECTOMY  age 52  . 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 TOTAL VAGINAL HYSTERECTOMY  age 68    Allergies Codeine  Family History  Problem Relation Age of Onset  . Cancer Mother        breast/mastectomy  . Heart disease Mother        poor  circulation  . Deep vein thrombosis Mother   . Osteoporosis Mother   . Hypertension Father   . Colon cancer Neg Hx     Social History Social History   Tobacco Use  . Smoking status: Never Smoker  . Smokeless tobacco: Never Used  Substance Use Topics  . Alcohol use: No  . Drug use: No    Review of Systems  Constitutional: No fever/chills. Positive lightheadedness.  Eyes: No visual changes. ENT: No sore throat. Cardiovascular: Denies chest pain. Respiratory: Denies shortness of breath. Gastrointestinal: Diffuse cramping abdominal pain. Positive nausea, vomiting, and black diarrhea.  No constipation. Genitourinary: Negative for dysuria. Musculoskeletal: Negative for back pain. Skin: Negative for rash. Neurological: Negative for headaches, focal weakness or numbness.  10-point ROS otherwise negative.  ____________________________________________   PHYSICAL EXAM:  VITAL SIGNS: ED Triage Vitals  Enc Vitals Group     BP 05/31/19 1127 (!) 83/72     Pulse Rate 05/31/19 1127 (!) 126     Resp 05/31/19 1127 20     Temp 05/31/19 1127 98.4 F (36.9 C)     Temp Source 05/31/19 1127 Oral     SpO2 05/31/19 1127 97 %     Weight 05/31/19 1129 118 lb (53.5 kg)     Height 05/31/19 1129 5\' 4"  (1.626 m)   Constitutional: Alert and oriented. Well appearing and in no acute distress. Eyes: Conjunctivae are normal.  Head: Atraumatic. Nose: No congestion/rhinnorhea. Mouth/Throat: Mucous membranes are moist.   Neck: No stridor.   Cardiovascular: Tachycardia. Good peripheral circulation. Grossly normal heart sounds.   Respiratory: Normal respiratory effort.  No retractions. Lungs CTAB. Gastrointestinal: Soft and nontender. No distention. Exam performed with patient consent and nurse chaperone. Black stool on rectal exam. No BRBPR.  Musculoskeletal: No gross deformities of extremities. Neurologic:  Normal speech and language. No gross focal neurologic deficits are appreciated.  Skin:   Skin is warm, dry and intact. No rash noted.  ____________________________________________   LABS (all labs ordered are listed, but only abnormal results are displayed)  Labs Reviewed  COMPREHENSIVE METABOLIC PANEL - Abnormal; Notable for the following components:      Result Value   Glucose, Bld 166 (*)    BUN 64 (*)    All other components within normal limits  CBC WITH DIFFERENTIAL/PLATELET - Abnormal; Notable for the following components:   WBC 13.1 (*)    RBC 3.80 (*)    MCV 102.1 (*)    Neutro Abs 11.5 (*)    All other components within normal limits  HEMOGLOBIN AND HEMATOCRIT, BLOOD - Abnormal; Notable for the following components:   Hemoglobin 10.7 (*)    HCT 33.4 (*)    All other components within normal limits  POC OCCULT BLOOD, ED - Abnormal; Notable for the following components:   Fecal Occult Bld POSITIVE (*)    All other components within normal limits  SARS CORONAVIRUS 2 BY RT PCR (HOSPITAL ORDER, [facility])  LIPASE, BLOOD  PROTIME-INR  OCCULT BLOOD X 1 CARD TO LAB, STOOL  HEMOGLOBIN AND HEMATOCRIT, BLOOD  URINALYSIS, ROUTINE W REFLEX MICROSCOPIC  BASIC METABOLIC PANEL  HEMOGLOBIN AND HEMATOCRIT, BLOOD  TYPE AND SCREEN  SURGICAL PATHOLOGY   ____________________________________________  EKG   EKG Interpretation  Date/Time:  Tuesday May 31 2019 11:41:08 EST Ventricular Rate:  115 PR Interval:    QRS Duration: 130 QT Interval:  325 QTC Calculation: 450 R Axis:   -52 Text Interpretation: Sinus tachycardia RBBB and LAFB No STEMI Confirmed by Nanda Quinton [phone]) on 05/31/2019 11:49:53 AM       ____________________________________________  RADIOLOGY  None ____________________________________________   PROCEDURES  Procedure(s) performed:   Procedures  CRITICAL CARE Performed by: Margette Fast Total critical care time: 35 minutes Critical care time was exclusive of separately billable procedures and  treating other patients. Critical care was necessary to treat or prevent imminent or life-threatening deterioration. Critical care was time spent personally by me on the following activities: development of treatment plan with patient and/or surrogate as well as nursing, discussions with consultants, evaluation of patient's response to treatment, examination of patient, obtaining history from patient or surrogate, ordering and performing treatments and interventions, ordering and review of laboratory studies, ordering and review of radiographic studies, pulse oximetry and re-evaluation of patient's condition.  Nanda Quinton, MD Emergency Medicine  ____________________________________________   INITIAL IMPRESSION / ASSESSMENT AND PLAN / ED COURSE  Pertinent labs & imaging results that were available during my care of the patient were reviewed by me and considered in my medical decision making (see chart for details).   Patient presents to the emergency department with hematemesis and melena.  Melena noted on exam.  Patient's initial blood pressure on arrival was hypotensive and heart rate tachycardic.  Improved slightly with fluids.  Suspect acute upper GI bleed.  Patient given Protonix bolus and blood sent for type and screen.  No emergency release blood required has vital signs have stabilized once bedded in the acute care area.   Spoke with Dr. Gala Romney. Hb reassuring but with melena and vital sign abnormalities, plan for upper endoscopy this afternoon. Will admit.   Discussed patient's case with [facility] to request admission. Patient and family (if present) updated with plan. Care transferred to [facility] service.  I reviewed all nursing notes, vitals, pertinent old records, EKGs, labs, imaging (as available).  ____________________________________________  FINAL CLINICAL IMPRESSION(S) / ED DIAGNOSES  Final diagnoses:  Gastrointestinal hemorrhage, unspecified gastrointestinal hemorrhage type   Hypotension, unspecified hypotension type     MEDICATIONS GIVEN DURING THIS VISIT:  Medications  cycloSPORINE (RESTASIS) 0.05 % ophthalmic emulsion 1 drop (has no administration in time range)  loratadine (CLARITIN) tablet 10 mg (10 mg Oral Not Given 05/31/19 1613)  sodium chloride flush (NS) 0.9 % injection 3 mL (has no administration in time range)  sodium chloride flush (NS) 0.9 % injection 3 mL (has no administration in time range)  0.9 %  sodium chloride infusion (has no administration in time range)  acetaminophen (TYLENOL) tablet 650 mg (has no administration in time range)    Or  acetaminophen (TYLENOL) suppository 650 mg (has no administration in time range)  traZODone (DESYREL) tablet 50 mg (has no administration in time range)  polyethylene glycol (MIRALAX / GLYCOLAX) packet 17 g (has no administration in time range)  ondansetron (ZOFRAN) tablet 4 mg (has no administration in time range)    Or  ondansetron (ZOFRAN) injection 4 mg (has no administration in time range)  albuterol (PROVENTIL) (2.5 MG/3ML) 0.083% nebulizer solution 2.5 mg (has no administration in time range)  0.9 %  sodium chloride infusion ( Intravenous Rate/Dose Verify 05/31/19 1800)  0.9 %  sodium chloride infusion ( Intravenous New Bag/Given 05/31/19 1431)  midazolam (VERSED) 5 MG/5ML injection (has no administration in time range)  ondansetron (ZOFRAN) 4 MG/2ML injection (has no administration in time range)  meperidine (DEMEROL) 50 MG/ML injection (has no administration in time range)  lidocaine (XYLOCAINE) 2 % viscous mouth solution (has no administration in time range)  EPINEPHrine (ADRENALIN) 1 MG/10ML injection (has no administration in time range)  pantoprazole (PROTONIX) 80 mg in sodium chloride 0.9 % 250 mL (0.32 mg/mL) infusion (8 mg/hr Intravenous Rate/Dose Verify 05/31/19 1800)  pantoprazole (PROTONIX) injection 40 mg (has no administration in time range)  sodium chloride 0.9 % bolus 500 mL (0 mLs  Intravenous Stopped 05/31/19 1321)  pantoprazole (PROTONIX) injection 80 mg (80 mg Intravenous Given 05/31/19 1200)    Note:  This document was prepared using Dragon voice recognition software and may include unintentional dictation errors.  Nanda Quinton, MD, [facility] Emergency Medicine    Long, W[facility], MD 05/31/19 Drema Halon

## 2019-05-31 NOTE — H&P (Signed)
Patient Demographics:    Doris Ford, is a 78 y.o. female  MRN: [id number]   DOB - [birth year]  Admit Date - 05/31/2019  Outpatient Primary MD for the patient is [name], [name], MD   Assessment & Plan:    Principal Problem:   GI bleed Active Problems:   Gastric ulcer with hemorrhage   HTN (hypertension)  EGD on 05/31/2019- Non-bleeding gastric ulcer with adherent clot.                            Injected/ thermally sealed..                           -Status post gastric mucosal biopsy   1) acute GI bleed secondary to bleeding from gastric ulcer-status post EGD with intervention as above -Continue IV Protonix -Serial H&H -Transfuse as clinically indicated  2)HTN-patient had transient hypotension on admission -Hold HCTZ -Allow some permissive hypertension at this time  3) leukocytosis--suspect reactive in the setting of GI bleed, check UA   With History of - Reviewed by me  Past Medical History:  Diagnosis Date  . Arthritis   . Cancer ([facility]) 12/07   right breast/tx with lumpectomy and tamoxifen  . Hypertension    under control  . Interstitial cystitis   . Osteopenia    no change      Past Surgical History:  Procedure Laterality Date  . BILATERAL SALPINGOOPHORECTOMY  age 53s  . BREAST SURGERY Right    lumpectomy-multiple times  . BUNIONECTOMY WITH HAMMERTOE RECONSTRUCTION Bilateral ~2000  . CATARACT EXTRACTION Bilateral 2007  . KNEE SURGERY Bilateral as teen  . TONSILLECTOMY AND ADENOIDECTOMY  age 35  . 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 TOTAL VAGINAL HYSTERECTOMY  age 64      Chief Complaint  Patient presents  with  . Hematemesis      HPI:    Doris Ford  is a 78 y.o. female medical history relevant for HTN and history of breast cancer presents to the ED on 06/17/2019 with one episode of hematemesis/bright red blood followed by abdominal cramp and melanotic stools --In ED patient was hypotensive with systolic blood pressure of around 67mmhg -Responded well to IV fluids -Initial hemoglobin 12.6 from a baseline of 14  -Additional history obtained from patient's daughter at bedside  -Apparently patient took naproxen in July and August after she had a foot fracture.... No NSAIDs since then  -Patient does not recall when she had her last colonoscopy but it was a while ago  No fever no chills  No urinary symptoms -No chest pains, no productive cough WBC of 13.1 noted  In ED--rectal exam by EDP with melanotic stool heme positive  Review of systems:    In addition to the HPI above,   A full Review of  Systems was done, all other systems reviewed are negative except as noted above in HPI , .    Social History:  Reviewed by me    Social History   Tobacco Use  . Smoking status: Never Smoker  . Smokeless tobacco: Never Used  Substance Use Topics  . Alcohol use: No       Family History :  Reviewed by me    Family History  Problem Relation Age of Onset  . Cancer Mother        breast/mastectomy  . Heart disease Mother        poor circulation  . Deep vein thrombosis Mother   . Osteoporosis Mother   . Hypertension Father   . Colon cancer Neg Hx      Home Medications:   Prior to Admission medications   Medication Sig Start Date End Date Taking? Authorizing Provider  Acetaminophen (TYLENOL ARTHRITIS PAIN PO) Take 650 mg by mouth as needed.   Yes [provider]  Ascorbic Acid (VITAMIN C PO) Take 1,000 mg by mouth daily.    Yes [provider]  Calcium-Vitamin D (CALTRATE 600 PLUS-VIT D PO) Take 600 mg by mouth daily.   Yes [provider]   Cholecalciferol (VITAMIN D PO) Take 1,000 Units by mouth daily.    Yes [provider]  colchicine 0.6 MG tablet Take 1 tablet (0.6 mg total) by mouth daily. 12/13/18  Yes Hudnall, Sharyn Lull, MD  hydrochlorothiazide (HYDRODIURIL) 12.5 MG tablet Take 1 tablet by mouth every morning. 12/28/18  Yes [provider]  loratadine (CLARITIN) 10 MG tablet Take 10 mg by mouth daily.   Yes [provider]  Multiple Vitamins-Minerals (OCUVITE PO) Take 1 tablet by mouth daily.    Yes [provider]  naproxen (NAPROSYN) 500 MG tablet Take 1 tablet (500 mg total) by mouth 2 (two) times daily as needed. 02/09/19  Yes Hudnall, Sharyn Lull, MD  naproxen sodium (ANAPROX) 220 MG tablet Take 220 mg by mouth as needed.   Yes [provider]  OVER THE COUNTER MEDICATION Take 1 tablet by mouth 2 (two) times daily. Cysta Q supplyment   Yes [provider]  quinapril (ACCUPRIL) 20 MG tablet Take 20 mg by mouth 2 (two) times daily.   Yes [provider]  RESTASIS 0.05 % ophthalmic emulsion INSTILL 1 DROP INTO BOTH EYES TWICE A DAY 12/11/14  Yes [provider]  traMADol (ULTRAM) 50 MG tablet TAKE 1 TABLET BY MOUTH EVERY 8 HOURS AS NEEDED. 01/04/19   Hudnall, Sharyn Lull, MD     Allergies:     Allergies  Allergen Reactions  . Codeine     Severe nausea.  Dizziness.     Physical Exam:   Vitals  Blood pressure (!) 152/75, pulse 82, temperature 98.2 F (36.8 C), temperature source Oral, resp. rate 16, height 5\' 4"  (1.626 m), weight 53.5 kg, SpO2 99 %.   Temp:  [98.2 F (36.8 C)-99.1 F (37.3 C)] 98.2 F (36.8 C) (11/03 1544) Pulse Rate:  [82-126] 82 (11/03 1544) Resp:  [12-25] 16 (11/03 1544) BP: (83-153)/(43-98) 152/75 (11/03 1544) SpO2:  [95 %-100 %] 99 % (11/03 1544) Weight:  [53.5 kg] 53.5 kg (11/03 1129)   Physical Examination: General appearance - alert, well appearing, and in no distress Mental status - alert, oriented to person, place, and  time,  Eyes - sclera anicteric Neck - supple, no JVD elevation , Chest - clear  to auscultation bilaterally, symmetrical air movement,  Heart - S1 and S2 normal, regular  Abdomen - soft, nontender, nondistended, no masses or organomegaly Neurological - screening mental status exam normal, neck supple without rigidity, cranial nerves II through XII intact, DTR's normal and symmetric Extremities - no pedal edema noted, intact peripheral pulses  Skin - warm, dry     Data Review:    CBC Recent Labs  Lab 05/31/19 1204  WBC 13.1*  HGB 12.6  HCT 38.8  PLT 299  MCV 102.1*  MCH 33.2  MCHC 32.5  RDW 11.9  LYMPHSABS 1.0  MONOABS 0.6  EOSABS 0.0  BASOSABS 0.0   ------------------------------------------------------------------------------------------------------------------  Chemistries  Recent Labs  Lab 05/31/19 1204  NA 138  K 4.1  CL 102  CO2 25  GLUCOSE 166*  BUN 64*  CREATININE 0.73  CALCIUM 9.2  AST 26  ALT 20  ALKPHOS 48  BILITOT 0.5   ------------------------------------------------------------------------------------------------------------------ estimated creatinine clearance is 47.4 mL/min (by C-G formula based on SCr of 0.73 mg/dL). ------------------------------------------------------------------------------------------------------------------ No results for input(s): TSH, T4TOTAL, T3FREE, THYROIDAB in the last 72 hours.  Invalid input(s): FREET3   Coagulation profile Recent Labs  Lab 05/31/19 1204  INR 1.1   ------------------------------------------------------------------------------------------------------------------- No results for input(s): DDIMER in the last 72 hours. -------------------------------------------------------------------------------------------------------------------  Cardiac Enzymes No results for input(s): CKMB, TROPONINI, MYOGLOBIN in the last 168 hours.  Invalid input(s): CK  ------------------------------------------------------------------------------------------------------------------ No results found for: BNP   ---------------------------------------------------------------------------------------------------------------  Urinalysis    Component Value Date/Time   COLORURINE YELLOW 12/12/2013 [place] 12/12/2013 1122   LABSPEC 1.015 12/12/2013 1122   PHURINE 7.5 12/12/2013 1122   GLUCOSEU NEGATIVE 12/12/2013 1122   HGBUR NEGATIVE 12/12/2013 1122   BILIRUBINUR n 07/16/2018 1314   KETONESUR NEGATIVE 12/12/2013 1122   PROTEINUR Positive (A) 07/16/2018 1314   PROTEINUR NEGATIVE 12/12/2013 1122   UROBILINOGEN 0.2 07/16/2018 1314   UROBILINOGEN 0.2 12/12/2013 1122   NITRITE n 07/16/2018 1314   NITRITE NEGATIVE 12/12/2013 1122   LEUKOCYTESUR Trace (A) 07/16/2018 1314    ----------------------------------------------------------------------------------------------------------------   Imaging Results:    No results found.  Radiological Exams on Admission: No results found.  DVT Prophylaxis -SCD  (Gi bleed) AM Labs Ordered, also please review Full Orders  Family Communication: Admission, patients condition and plan of care including tests being ordered have been discussed with the patient and daughter at bedside who indicate understanding and agree with the plan   Code Status - Full Code  Likely DC to  Home   Condition   stable  Roxan Hockey M.D on 05/31/2019 at 3:59 PM Go to [email or web address] -  for contact info  [facility] - Office  [phone]

## 2019-06-01 DIAGNOSIS — I10 Essential (primary) hypertension: Secondary | ICD-10-CM | POA: Diagnosis not present

## 2019-06-01 DIAGNOSIS — K254 Chronic or unspecified gastric ulcer with hemorrhage: Principal | ICD-10-CM

## 2019-06-01 LAB — CBC
HCT: 28.7 % — ABNORMAL LOW (ref 36.0–46.0)
Hemoglobin: 9.1 g/dL — ABNORMAL LOW (ref 12.0–15.0)
MCH: 33.3 pg (ref 26.0–34.0)
MCHC: 31.7 g/dL (ref 30.0–36.0)
MCV: 105.1 fL — ABNORMAL HIGH (ref 80.0–100.0)
Platelets: 188 10*3/uL (ref 150–400)
RBC: 2.73 MIL/uL — ABNORMAL LOW (ref 3.87–5.11)
RDW: 12.4 % (ref 11.5–15.5)
WBC: 6 10*3/uL (ref 4.0–10.5)
nRBC: 0 % (ref 0.0–0.2)

## 2019-06-01 LAB — BASIC METABOLIC PANEL
Anion gap: 10 (ref 5–15)
BUN: 36 mg/dL — ABNORMAL HIGH (ref 8–23)
CO2: 23 mmol/L (ref 22–32)
Calcium: 8.6 mg/dL — ABNORMAL LOW (ref 8.9–10.3)
Chloride: 111 mmol/L (ref 98–111)
Creatinine, Ser: 0.64 mg/dL (ref 0.44–1.00)
GFR calc Af Amer: >60 mL/min (ref >60)
GFR calc non Af Amer: >60 mL/min (ref >60)
Glucose, Bld: 104 mg/dL — ABNORMAL HIGH (ref 70–99)
Potassium: 3.6 mmol/L (ref 3.5–5.1)
Sodium: 144 mmol/L (ref 135–145)

## 2019-06-01 LAB — HEMOGLOBIN AND HEMATOCRIT, BLOOD
HCT: 28.7 % — ABNORMAL LOW (ref 36.0–46.0)
Hemoglobin: 9.2 g/dL — ABNORMAL LOW (ref 12.0–15.0)

## 2019-06-01 MED ORDER — LABETALOL HCL 5 MG/ML IV SOLN: 10.0000 mg | INTRAVENOUS | Status: DC | PRN

## 2019-06-01 NOTE — Progress Notes (Addendum)
    Subjective: No abdominal pain. No melena. No BM since admission. No N/V. Would like to have something to drink. Reports she will avoid NSAIDs going forward.   Objective: Vital signs in last 24 hours: Temp:  [97.9 F (36.6 C)-99.4 F (37.4 C)] 98.9 F (37.2 C) (11/04 0433) Pulse Rate:  [82-126] 91 (11/04 0433) Resp:  [12-25] 16 (11/04 0433) BP: (83-153)/(43-98) 136/60 (11/04 0433) SpO2:  [95 %-100 %] 95 % (11/04 0433) Weight:  [53.5 kg-57.3 kg] 57.3 kg (11/04 0433) Last BM Date: 05/31/19 General:   Alert and oriented, pleasant, appears younger than stated age Abdomen:  Bowel sounds present, soft, non-tender, non-distended. No HSM or hernias noted. No rebound or guarding. No masses appreciated . Neurologic:  Alert and  oriented x4 Psych:  Normal mood and affect.  Intake/Output from previous day: 11/03 0701 - 11/04 0700 In: 1339.8 [I.V.:839.8; IV Piggyback:500] Out: 650 [Urine:650] Intake/Output this shift: No intake/output data recorded.  Lab Results: Recent Labs    05/31/19 1204 05/31/19 1752 05/31/19 2248 06/01/19 0418  WBC 13.1*  --   --   --   HGB 12.6 10.7* 9.9* 9.2*  HCT 38.8 33.4* 30.7* 28.7*  PLT 299  --   --   --    BMET Recent Labs    05/31/19 1204 06/01/19 0418  NA 138 144  K 4.1 3.6  CL 102 111  CO2 25 23  GLUCOSE 166* 104*  BUN 64* 36*  CREATININE 0.73 0.64  CALCIUM 9.2 8.6*   LFT Recent Labs    05/31/19 1204  PROT 6.8  ALBUMIN 4.0  AST 26  ALT 20  ALKPHOS 48  BILITOT 0.5   PT/INR Recent Labs    05/31/19 1204  LABPROT 13.8  INR 1.1    Assessment: 78 year old very pleasant female admitted with acute blood loss anemia due to UGI bleed, and EGD yesterday with non-bleeding gastric ulcer with adherent clot s/p epi injection and thermal sealing. No further overt GI bleeding. Hgb 9.2 this morning. Appropriate for clear liquids now with advancement to soft diet this evening as tolerated.    Plan: Continue PPI infusion for a total  of 72 hours (ending 11/6), with PPI BID thereafter Clear liquids now. May advance to soft diet this evening if doing well Follow H/H Follow-up on biopsy pending Avoid NSAIDs Surveillance EGD in 3 months  Annitta Needs, PhD, ANP-BC [facility]      LOS: 0 days    06/01/2019, 8:41 AM

## 2019-06-01 NOTE — Progress Notes (Signed)
Patient Demographics:    Doris Ford, is a 78 y.o. female, DOB - 1941-01-19, [id number]  Admit date - 05/31/2019   Admitting Physician Tung Pustejovsky Denton Brick, MD  Outpatient Primary MD for the patient is [name], [name], MD  LOS - 0   Chief Complaint  Patient presents with  . Hematemesis        Subjective:    Doris Ford today has no fevers, no emesis,  No chest pain,  Daughter at bedside,  Feels hungry Wants to eat   Assessment  & Plan :    Principal Problem:   GI bleed Active Problems:   Gastric ulcer with hemorrhage   HTN (hypertension)   - Brief Summary 78 y.o. female medical history relevant for HTN and history of breast cancer admitted on 06/17/2019 with one episode of hematemesis/bright red blood followed by abdominal cramp and melanotic  EGD on 05/31/2019- Non-bleeding gastric ulcer with adherent clot.  Injected/ thermally sealed.. -Status post gastric mucosal biopsy   A/p 1) acute GI bleed secondary to bleeding from gastric ulcer-status post EGD with intervention as above -Continue IV Protonix per GI service for 72 hrs -Serial H&H -Hemoglobin down to 9.2 from a baseline around 14 -Transfuse as clinically indicated -Try soft diet  2)HTN-patient had transient hypotension on admission -Hold HCTZ - 3) leukocytosis--suspect reactive in the setting of GI bleed, UA does not look infected -No respiratory symptoms  Disposition/Need for in-Hospital Stay- patient unable to be discharged at this time due to --- GI bleed requiring IV Protonix per GI service*  Code Status : full  Family Communication:   (patient is alert, awake and coherent) Daughter at bedside  Disposition Plan  : tbd  Consults  :  Gi   DVT Prophylaxis  :  SCDs   Lab Results  Component Value Date   PLT 188 06/01/2019    Inpatient Medications   Scheduled Meds: . cycloSPORINE  1 drop Both Eyes BID  . loratadine  10 mg Oral Daily  . [START ON 06/04/2019] pantoprazole  40 mg Intravenous Q12H  . sodium chloride flush  3 mL Intravenous Q12H   Continuous Infusions: . sodium chloride    . sodium chloride 50 mL/hr at 06/01/19 1031  . sodium chloride 20 mL/hr at 05/31/19 1431  . pantoprozole (PROTONIX) infusion 8 mg/hr (06/01/19 1146)   PRN Meds:.sodium chloride, acetaminophen **OR** acetaminophen, albuterol, ondansetron **OR** ondansetron (ZOFRAN) IV, polyethylene glycol, sodium chloride flush, traZODone    Anti-infectives (From admission, onward)   None        Objective:   Vitals:   05/31/19 1700 05/31/19 2105 06/01/19 0433 06/01/19 1506  BP: (!) 141/72 (!) 149/64 136/60 133/60  Pulse: 82 88 91 75  Resp: 16 16 16 20   Temp: 97.9 F (36.6 C) 99.4 F (37.4 C) 98.9 F (37.2 C) 98 F (36.7 C)  TempSrc: Oral Oral Oral Oral  SpO2: 99% 95% 95% 100%  Weight:   57.3 kg   Height:        Wt Readings from Last 3 Encounters:  06/01/19 57.3 kg  12/22/18 52.6 kg  12/21/18 52.6 kg     Intake/Output Summary (Last 24 hours) at 06/01/2019 1936 Last data filed at 06/01/2019 1000 Gross per 24 hour  Intake 939.64 ml  Output 400 ml  Net 539.64 ml     Physical Exam  Gen:- Awake Alert,  In no apparent distress  HEENT:- Doris Ford.AT, No sclera icterus Neck-Supple Neck,No JVD,.  Lungs-  CTAB , fair symmetrical air movement CV- S1, S2 normal, regular  Abd-  +ve B.Sounds, Abd Soft, epigastric tenderness,    Extremity/Skin:- No  edema, pedal pulses present  Psych-affect is appropriate, oriented x3 Neuro-no new focal deficits, no tremors   Data Review:   Micro Results Recent Results (from the past 240 hour(s))  SARS Coronavirus 2 by RT PCR (hospital order, performed in [facility] hospital lab) Nasopharyngeal Nasopharyngeal Swab     Status: None   Collection Time: 05/31/19 11:50 AM   Specimen: Nasopharyngeal Swab  Result Value  Ref Range Status   SARS Coronavirus 2 NEGATIVE NEGATIVE Final    Comment: (NOTE) If result is NEGATIVE SARS-CoV-2 target nucleic acids are NOT DETECTED. The SARS-CoV-2 RNA is generally detectable in upper and lower  respiratory specimens during the acute phase of infection. The lowest  concentration of SARS-CoV-2 viral copies this assay can detect is 250  copies / mL. A negative result does not preclude SARS-CoV-2 infection  and should not be used as the sole basis for treatment or other  patient management decisions.  A negative result may occur with  improper specimen collection / handling, submission of specimen other  than nasopharyngeal swab, presence of viral mutation(s) within the  areas targeted by this assay, and inadequate number of viral copies  (<250 copies / mL). A negative result must be combined with clinical  observations, patient history, and epidemiological information. If result is POSITIVE SARS-CoV-2 target nucleic acids are DETECTED. The SARS-CoV-2 RNA is generally detectable in upper and lower  respiratory specimens dur ing the acute phase of infection.  Positive  results are indicative of active infection with SARS-CoV-2.  Clinical  correlation with patient history and other diagnostic information is  necessary to determine patient infection status.  Positive results do  not rule out bacterial infection or co-infection with other viruses. If result is PRESUMPTIVE POSTIVE SARS-CoV-2 nucleic acids MAY BE PRESENT.   A presumptive positive result was obtained on the submitted specimen  and confirmed on repeat testing.  While 2019 novel coronavirus  (SARS-CoV-2) nucleic acids may be present in the submitted sample  additional confirmatory testing may be necessary for epidemiological  and / or clinical management purposes  to differentiate between  SARS-CoV-2 and other Sarbecovirus currently known to infect humans.  If clinically indicated additional testing with an  alternate test  methodology [phone]) is advised. The SARS-CoV-2 RNA is generally  detectable in upper and lower respiratory sp ecimens during the acute  phase of infection. The expected result is Negative. Fact Sheet for Patients:  StrictlyIdeas.no Fact Sheet for Healthcare Providers: BankingDealers.co.za This test is not yet approved or cleared by the [place] FDA and has been authorized for detection and/or diagnosis of SARS-CoV-2 by FDA under an Emergency Use Authorization (EUA).  This EUA will remain in effect (meaning this test can be used) for the duration of the COVID-19 declaration under Section 564(b)(1) of the Act, 21 U.S.C. section 360bbb-3(b)(1), unless the authorization is terminated or revoked sooner. Performed at [facility], [address] [place],  [address]     Radiology Reports No results found.   CBC Recent Labs  Lab 05/31/19 1204 05/31/19 1752 05/31/19 2248 06/01/19 0418 06/01/19 1249  WBC 13.1*  --   --   --  6.0  HGB 12.6 10.7* 9.9* 9.2* 9.1*  HCT 38.8 33.4* 30.7* 28.7* 28.7*  PLT 299  --   --   --  188  MCV 102.1*  --   --   --  105.1*  MCH 33.2  --   --   --  33.3  MCHC 32.5  --   --   --  31.7  RDW 11.9  --   --   --  12.4  LYMPHSABS 1.0  --   --   --   --   MONOABS 0.6  --   --   --   --   EOSABS 0.0  --   --   --   --   BASOSABS 0.0  --   --   --   --     Chemistries  Recent Labs  Lab 05/31/19 1204 06/01/19 0418  NA 138 144  K 4.1 3.6  CL 102 111  CO2 25 23  GLUCOSE 166* 104*  BUN 64* 36*  CREATININE 0.73 0.64  CALCIUM 9.2 8.6*  AST 26  --   ALT 20  --   ALKPHOS 48  --   BILITOT 0.5  --    ------------------------------------------------------------------------------------------------------------------ No results for input(s): CHOL, HDL, LDLCALC, TRIG, CHOLHDL, LDLDIRECT in the last 72 hours.  No results found for: HGBA1C  ------------------------------------------------------------------------------------------------------------------ No results for input(s): TSH, T4TOTAL, T3FREE, THYROIDAB in the last 72 hours.  Invalid input(s): FREET3 ------------------------------------------------------------------------------------------------------------------ No results for input(s): VITAMINB12, FOLATE, FERRITIN, TIBC, IRON, RETICCTPCT in the last 72 hours.  Coagulation profile Recent Labs  Lab 05/31/19 1204  INR 1.1    No results for input(s): DDIMER in the last 72 hours.  Cardiac Enzymes No results for input(s): CKMB, TROPONINI, MYOGLOBIN in the last 168 hours.  Invalid input(s): CK ------------------------------------------------------------------------------------------------------------------ No results found for: BNP   Roxan Hockey M.D on 06/01/2019 at 7:36 PM  Go to [email or web address] - for contact info  [facility] - Office  [phone]

## 2019-06-02 ENCOUNTER — Encounter: Payer: Self-pay | Admitting: Internal Medicine

## 2019-06-02 ENCOUNTER — Telehealth: Payer: Self-pay | Admitting: Gastroenterology

## 2019-06-02 ENCOUNTER — Encounter: Payer: Self-pay | Admitting: Gastroenterology

## 2019-06-02 DIAGNOSIS — Z79891 Long term (current) use of opiate analgesic: Secondary | ICD-10-CM | POA: Diagnosis not present

## 2019-06-02 DIAGNOSIS — Z79899 Other long term (current) drug therapy: Secondary | ICD-10-CM | POA: Diagnosis not present

## 2019-06-02 DIAGNOSIS — Z791 Long term (current) use of non-steroidal anti-inflammatories (NSAID): Secondary | ICD-10-CM | POA: Diagnosis not present

## 2019-06-02 DIAGNOSIS — Z8262 Family history of osteoporosis: Secondary | ICD-10-CM | POA: Diagnosis not present

## 2019-06-02 DIAGNOSIS — M8580 Other specified disorders of bone density and structure, unspecified site: Secondary | ICD-10-CM | POA: Diagnosis present

## 2019-06-02 DIAGNOSIS — Z803 Family history of malignant neoplasm of breast: Secondary | ICD-10-CM | POA: Diagnosis not present

## 2019-06-02 DIAGNOSIS — D62 Acute posthemorrhagic anemia: Secondary | ICD-10-CM | POA: Diagnosis present

## 2019-06-02 DIAGNOSIS — K254 Chronic or unspecified gastric ulcer with hemorrhage: Secondary | ICD-10-CM | POA: Diagnosis present

## 2019-06-02 DIAGNOSIS — Z23 Encounter for immunization: Secondary | ICD-10-CM | POA: Diagnosis present

## 2019-06-02 DIAGNOSIS — Z885 Allergy status to narcotic agent status: Secondary | ICD-10-CM | POA: Diagnosis not present

## 2019-06-02 DIAGNOSIS — K222 Esophageal obstruction: Secondary | ICD-10-CM | POA: Diagnosis present

## 2019-06-02 DIAGNOSIS — Z9841 Cataract extraction status, right eye: Secondary | ICD-10-CM | POA: Diagnosis not present

## 2019-06-02 DIAGNOSIS — I10 Essential (primary) hypertension: Secondary | ICD-10-CM | POA: Diagnosis present

## 2019-06-02 DIAGNOSIS — Z8249 Family history of ischemic heart disease and other diseases of the circulatory system: Secondary | ICD-10-CM | POA: Diagnosis not present

## 2019-06-02 DIAGNOSIS — Z9842 Cataract extraction status, left eye: Secondary | ICD-10-CM | POA: Diagnosis not present

## 2019-06-02 DIAGNOSIS — Z853 Personal history of malignant neoplasm of breast: Secondary | ICD-10-CM | POA: Diagnosis not present

## 2019-06-02 DIAGNOSIS — Z96641 Presence of right artificial hip joint: Secondary | ICD-10-CM | POA: Diagnosis present

## 2019-06-02 DIAGNOSIS — Z90710 Acquired absence of both cervix and uterus: Secondary | ICD-10-CM | POA: Diagnosis not present

## 2019-06-02 DIAGNOSIS — K449 Diaphragmatic hernia without obstruction or gangrene: Secondary | ICD-10-CM | POA: Diagnosis present

## 2019-06-02 DIAGNOSIS — K250 Acute gastric ulcer with hemorrhage: Secondary | ICD-10-CM | POA: Diagnosis not present

## 2019-06-02 DIAGNOSIS — Z20828 Contact with and (suspected) exposure to other viral communicable diseases: Secondary | ICD-10-CM | POA: Diagnosis present

## 2019-06-02 DIAGNOSIS — K920 Hematemesis: Secondary | ICD-10-CM | POA: Diagnosis present

## 2019-06-02 DIAGNOSIS — I959 Hypotension, unspecified: Secondary | ICD-10-CM | POA: Diagnosis present

## 2019-06-02 LAB — SURGICAL PATHOLOGY

## 2019-06-02 LAB — BASIC METABOLIC PANEL
Anion gap: 7 (ref 5–15)
BUN: 17 mg/dL (ref 8–23)
CO2: 25 mmol/L (ref 22–32)
Calcium: 8.7 mg/dL — ABNORMAL LOW (ref 8.9–10.3)
Chloride: 110 mmol/L (ref 98–111)
Creatinine, Ser: 0.56 mg/dL (ref 0.44–1.00)
GFR calc Af Amer: >60 mL/min (ref >60)
GFR calc non Af Amer: >60 mL/min (ref >60)
Glucose, Bld: 126 mg/dL — ABNORMAL HIGH (ref 70–99)
Potassium: 3.5 mmol/L (ref 3.5–5.1)
Sodium: 142 mmol/L (ref 135–145)

## 2019-06-02 LAB — CBC
HCT: 31 % — ABNORMAL LOW (ref 36.0–46.0)
Hemoglobin: 9.9 g/dL — ABNORMAL LOW (ref 12.0–15.0)
MCH: 33 pg (ref 26.0–34.0)
MCHC: 31.9 g/dL (ref 30.0–36.0)
MCV: 103.3 fL — ABNORMAL HIGH (ref 80.0–100.0)
Platelets: 203 10*3/uL (ref 150–400)
RBC: 3 MIL/uL — ABNORMAL LOW (ref 3.87–5.11)
RDW: 12.3 % (ref 11.5–15.5)
WBC: 6.3 10*3/uL (ref 4.0–10.5)
nRBC: 0 % (ref 0.0–0.2)

## 2019-06-02 MED ORDER — POTASSIUM CHLORIDE CRYS ER 20 MEQ PO TBCR
40.0000 meq | EXTENDED_RELEASE_TABLET | Freq: Once | ORAL | Status: AC
  Administered 2019-06-02: 40 meq via ORAL
  Filled 2019-06-02: qty 2

## 2019-06-02 MED ORDER — POLYVINYL ALCOHOL 1.4 % OP SOLN
1.0000 [drp] | OPHTHALMIC | Status: DC | PRN
  Administered 2019-06-02: 1 [drp] via OPHTHALMIC
  Filled 2019-06-02: qty 15

## 2019-06-02 NOTE — Progress Notes (Addendum)
Subjective:  Feels well. No abdominal pain. No stool in two days. Tolerating advanced diet.   Objective: Vital signs in last 24 hours: Temp:  [98 F (36.7 C)-98.3 F (36.8 C)] 98 F (36.7 C) (11/05 0535) Pulse Rate:  [75-81] 81 (11/05 0535) Resp:  [16-20] 16 (11/05 0535) BP: (120-133)/(60-63) 120/63 (11/05 0535) SpO2:  [98 %-100 %] 98 % (11/05 0535) Weight:  [51.8 kg] 51.8 kg (11/05 0500) Last BM Date: 05/31/19 General:   Alert,  Well-developed, well-nourished, pleasant and cooperative in NAD Head:  Normocephalic and atraumatic. Eyes:  Sclera clear, no icterus.  Abdomen:  Soft Extremities:  Without clubbing, deformity or edema. Neurologic:  Alert and  oriented x4;  grossly normal neurologically. Psych:  Alert and cooperative. Normal mood and affect.  Intake/Output from previous day: 11/04 0701 - 11/05 0700 In: 2455.8 [P.O.:240; I.V.:2215.8] Out: -  Intake/Output this shift: No intake/output data recorded.  Lab Results: CBC Recent Labs    05/31/19 1204  05/31/19 2248 06/01/19 0418 06/01/19 1249  WBC 13.1*  --   --   --  6.0  HGB 12.6   < > 9.9* 9.2* 9.1*  HCT 38.8   < > 30.7* 28.7* 28.7*  MCV 102.1*  --   --   --  105.1*  PLT 299  --   --   --  188   < > = values in this interval not displayed.   BMET Recent Labs    05/31/19 1204 06/01/19 0418  NA 138 144  K 4.1 3.6  CL 102 111  CO2 25 23  GLUCOSE 166* 104*  BUN 64* 36*  CREATININE 0.73 0.64  CALCIUM 9.2 8.6*   LFTs Recent Labs    05/31/19 1204  BILITOT 0.5  ALKPHOS 48  AST 26  ALT 20  PROT 6.8  ALBUMIN 4.0   Recent Labs    05/31/19 1204  LIPASE 36   PT/INR Recent Labs    05/31/19 1204  LABPROT 13.8  INR 1.1      Imaging Studies: No results found.[2 weeks]   Assessment: 78 year old female admitted with acute blood loss anemia due to upper GI bleed.  EGD November 3 with nonbleeding gastric ulcer with adherent clot status post epi injection and normal saline.  No further overt GI  bleeding noted.  Hemoglobin yesterday stable at 9.1.  Plan: 1. Complete 72-hour course of PPI infusion (ending November 6), with PPI twice daily thereafter. 2. Monitor for further GI bleeding. 3. Avoid NSAIDs. 4. Surveillance EGD in 3 months with Dr. Gala Romney. 5. Follow-up pending path.  Laureen Ochs. Bernarda Caffey [facility] [phone] 11/5/20209:21 AM  Gastric biopsies negative for H. pylori   LOS: 0 days

## 2019-06-02 NOTE — Telephone Encounter (Signed)
Please arrange for hospital follow-up in 6 to 8 weeks to schedule 81-month follow-up EGD for history of gastric ulcer.

## 2019-06-02 NOTE — Telephone Encounter (Signed)
PATIENT SCHEDULED AND LETTER SENT  °

## 2019-06-02 NOTE — Progress Notes (Signed)
Patient Demographics:    Doris Ford, is a 78 y.o. female, DOB - [birth year], OR:[id number]  Admit date - 05/31/2019   Admitting Physician Joelle Flessner Denton Brick, MD  Outpatient Primary MD for the patient is [name], [name], MD  LOS - 0   Chief Complaint  Patient presents with  . Hematemesis        Subjective:    Doris Ford today has no fevers, no emesis,  No chest pain,  Nursing students at bedsides Eating ok   Assessment  & Plan :    Principal Problem:   Gastric ulcer with hemorrhage but without obstruction Active Problems:   GI bleed   Gastric ulcer with hemorrhage   HTN (hypertension)   - Brief Summary 78 y.o. female medical history relevant for HTN and history of breast cancer admitted on 06/17/2019 with one episode of hematemesis/bright red blood followed by abdominal cramp and melanotic  EGD on 05/31/2019- Non-bleeding gastric ulcer with adherent clot.  Injected/ thermally sealed.. -Status post gastric mucosal biopsy   A/p 1)Acute GI bleed secondary to bleeding from gastric ulcer-status post EGD with intervention as above -Continue IV Protonix per GI service for 72 hrs (ending 06/03/19 afternoon) Then transition 40 mg bid -Serial H&H -Hemoglobin down to 9.9 from a baseline around 14 -Transfuse as clinically indicated -Advance diet to soft  2)HTN-patient had transient hypotension on admission BP currently stable -Hold HCTZ -  3)Leukocytosis--suspect reactive in the setting of GI bleed, UA does not look infected -No respiratory symptoms WBC is down to 6.3 from 13.1 on admission  Disposition/Need for in-Hospital Stay- patient unable to be discharged at this time due to --- GI bleed requiring IV Protonix per GI service  Code Status : full  Family Communication:   (patient is alert, awake and coherent) Daughter at  bedside  Disposition Plan  : Possibly home on 06/03/2019 Consults  :  Gi   DVT Prophylaxis  :  SCDs   Lab Results  Component Value Date   PLT 203 06/02/2019    Inpatient Medications  Scheduled Meds: . cycloSPORINE  1 drop Both Eyes BID  . loratadine  10 mg Oral Daily  . [START ON 06/04/2019] pantoprazole  40 mg Intravenous Q12H  . potassium chloride  40 mEq Oral Once  . sodium chloride flush  3 mL Intravenous Q12H   Continuous Infusions: . sodium chloride    . sodium chloride 20 mL/hr at 05/31/19 1431  . pantoprozole (PROTONIX) infusion 8 mg/hr (06/02/19 0856)   PRN Meds:.sodium chloride, acetaminophen **OR** acetaminophen, albuterol, labetalol, ondansetron **OR** ondansetron (ZOFRAN) IV, polyethylene glycol, polyvinyl alcohol, sodium chloride flush, traZODone    Anti-infectives (From admission, onward)   None        Objective:   Vitals:   06/02/19 0500 06/02/19 0535 06/02/19 0803 06/02/19 1445  BP:  120/63 132/68 (!) 152/65  Pulse:  81 81 80  Resp:  16 18 16   Temp:  98 F (36.7 C) 98 F (36.7 C)   TempSrc:  Oral Oral   SpO2:  98% 96% 98%  Weight: 51.8 kg     Height:        Wt Readings from Last 3 Encounters:  06/02/19 51.8 kg  12/22/18 52.6 kg  12/21/18  52.6 kg     Intake/Output Summary (Last 24 hours) at 06/02/2019 1453 Last data filed at 06/02/2019 0900 Gross per 24 hour  Intake 2813.76 ml  Output -  Net 2813.76 ml     Physical Exam  Gen:- Awake Alert,  In no apparent distress  HEENT:- [place].AT, No sclera icterus Neck-Supple Neck,No JVD,.  Lungs-  CTAB , fair symmetrical air movement CV- S1, S2 normal, regular  Abd-  +ve B.Sounds, Abd Soft, resolving epigastric tenderness,    Extremity/Skin:- No  edema, pedal pulses present  Psych-affect is appropriate, oriented x3 Neuro-no new focal deficits, no tremors   Data Review:   Micro Results Recent Results (from the past 240 hour(s))  SARS Coronavirus 2 by RT PCR (hospital order, performed in  [facility] hospital lab) Nasopharyngeal Nasopharyngeal Swab     Status: None   Collection Time: 05/31/19 11:50 AM   Specimen: Nasopharyngeal Swab  Result Value Ref Range Status   SARS Coronavirus 2 NEGATIVE NEGATIVE Final    Comment: (NOTE) If result is NEGATIVE SARS-CoV-2 target nucleic acids are NOT DETECTED. The SARS-CoV-2 RNA is generally detectable in upper and lower  respiratory specimens during the acute phase of infection. The lowest  concentration of SARS-CoV-2 viral copies this assay can detect is 250  copies / mL. A negative result does not preclude SARS-CoV-2 infection  and should not be used as the sole basis for treatment or other  patient management decisions.  A negative result may occur with  improper specimen collection / handling, submission of specimen other  than nasopharyngeal swab, presence of viral mutation(s) within the  areas targeted by this assay, and inadequate number of viral copies  (<250 copies / mL). A negative result must be combined with clinical  observations, patient history, and epidemiological information. If result is POSITIVE SARS-CoV-2 target nucleic acids are DETECTED. The SARS-CoV-2 RNA is generally detectable in upper and lower  respiratory specimens dur ing the acute phase of infection.  Positive  results are indicative of active infection with SARS-CoV-2.  Clinical  correlation with patient history and other diagnostic information is  necessary to determine patient infection status.  Positive results do  not rule out bacterial infection or co-infection with other viruses. If result is PRESUMPTIVE POSTIVE SARS-CoV-2 nucleic acids MAY BE PRESENT.   A presumptive positive result was obtained on the submitted specimen  and confirmed on repeat testing.  While 2019 novel coronavirus  (SARS-CoV-2) nucleic acids may be present in the submitted sample  additional confirmatory testing may be necessary for epidemiological  and / or clinical  management purposes  to differentiate between  SARS-CoV-2 and other Sarbecovirus currently known to infect humans.  If clinically indicated additional testing with an alternate test  methodology [phone]) is advised. The SARS-CoV-2 RNA is generally  detectable in upper and lower respiratory sp ecimens during the acute  phase of infection. The expected result is Negative. Fact Sheet for Patients:  StrictlyIdeas.no Fact Sheet for Healthcare Providers: BankingDealers.co.za This test is not yet approved or cleared by the [place] FDA and has been authorized for detection and/or diagnosis of SARS-CoV-2 by FDA under an Emergency Use Authorization (EUA).  This EUA will remain in effect (meaning this test can be used) for the duration of the COVID-19 declaration under Section 564(b)(1) of the Act, 21 U.S.C. section 360bbb-3(b)(1), unless the authorization is terminated or revoked sooner. Performed at [facility], [address] [place], [place] [address]     Radiology Reports No  results found.   CBC Recent Labs  Lab 05/31/19 1204 05/31/19 1752 05/31/19 2248 06/01/19 0418 06/01/19 1249 06/02/19 0858  WBC 13.1*  --   --   --  6.0 6.3  HGB 12.6 10.7* 9.9* 9.2* 9.1* 9.9*  HCT 38.8 33.4* 30.7* 28.7* 28.7* 31.0*  PLT 299  --   --   --  188 203  MCV 102.1*  --   --   --  105.1* 103.3*  MCH 33.2  --   --   --  33.3 33.0  MCHC 32.5  --   --   --  31.7 31.9  RDW 11.9  --   --   --  12.4 12.3  LYMPHSABS 1.0  --   --   --   --   --   MONOABS 0.6  --   --   --   --   --   EOSABS 0.0  --   --   --   --   --   BASOSABS 0.0  --   --   --   --   --     Chemistries  Recent Labs  Lab 05/31/19 1204 06/01/19 0418 06/02/19 0858  NA 138 144 142  K 4.1 3.6 3.5  CL 102 111 110  CO2 25 23 25   GLUCOSE 166* 104* 126*  BUN 64* 36* 17  CREATININE 0.73 0.64 0.56  CALCIUM 9.2 8.6* 8.7*  AST 26  --   --   ALT 20  --   --   ALKPHOS 48  --   --    BILITOT 0.5  --   --    ------------------------------------------------------------------------------------------------------------------ No results for input(s): CHOL, HDL, LDLCALC, TRIG, CHOLHDL, LDLDIRECT in the last 72 hours.  No results found for: HGBA1C ------------------------------------------------------------------------------------------------------------------ No results for input(s): TSH, T4TOTAL, T3FREE, THYROIDAB in the last 72 hours.  Invalid input(s): FREET3 ------------------------------------------------------------------------------------------------------------------ No results for input(s): VITAMINB12, FOLATE, FERRITIN, TIBC, IRON, RETICCTPCT in the last 72 hours.  Coagulation profile Recent Labs  Lab 05/31/19 1204  INR 1.1    No results for input(s): DDIMER in the last 72 hours.  Cardiac Enzymes No results for input(s): CKMB, TROPONINI, MYOGLOBIN in the last 168 hours.  Invalid input(s): CK ------------------------------------------------------------------------------------------------------------------ No results found for: BNP   Roxan Hockey M.D on 06/02/2019 at 2:53 PM  Go to [email or web address] - for contact info  [facility] - Office  [phone]

## 2019-06-03 ENCOUNTER — Encounter (HOSPITAL_COMMUNITY): Payer: Self-pay | Admitting: Internal Medicine

## 2019-06-03 LAB — CBC
HCT: 34.3 % — ABNORMAL LOW (ref 36.0–46.0)
Hemoglobin: 10.7 g/dL — ABNORMAL LOW (ref 12.0–15.0)
MCH: 32.5 pg (ref 26.0–34.0)
MCHC: 31.2 g/dL (ref 30.0–36.0)
MCV: 104.3 fL — ABNORMAL HIGH (ref 80.0–100.0)
Platelets: 245 10*3/uL (ref 150–400)
RBC: 3.29 MIL/uL — ABNORMAL LOW (ref 3.87–5.11)
RDW: 12.3 % (ref 11.5–15.5)
WBC: 7.1 10*3/uL (ref 4.0–10.5)
nRBC: 0 % (ref 0.0–0.2)

## 2019-06-03 MED ORDER — TRAZODONE HCL 50 MG PO TABS: 50.0000 mg | ORAL_TABLET | Freq: Every evening | ORAL | 1 refills | Status: DC | PRN

## 2019-06-03 MED ORDER — POLYETHYLENE GLYCOL 3350 17 G PO PACK: 17.0000 g | PACK | Freq: Every day | ORAL | 0 refills | Status: AC | PRN

## 2019-06-03 MED ORDER — ONDANSETRON HCL 4 MG PO TABS: 4.0000 mg | ORAL_TABLET | Freq: Four times a day (QID) | ORAL | 0 refills | Status: DC | PRN

## 2019-06-03 MED ORDER — PNEUMOCOCCAL VAC POLYVALENT 25 MCG/0.5ML IJ INJ
0.5000 mL | INJECTION | INTRAMUSCULAR | Status: AC
  Administered 2019-06-03: 0.5 mL via INTRAMUSCULAR
  Filled 2019-06-03: qty 0.5

## 2019-06-03 MED ORDER — PANTOPRAZOLE SODIUM 40 MG PO TBEC: 40.0000 mg | DELAYED_RELEASE_TABLET | Freq: Two times a day (BID) | ORAL | 3 refills | Status: DC

## 2019-06-03 NOTE — Progress Notes (Signed)
Nsg Discharge Note  Admit Date:  05/31/2019 Discharge date: 06/03/2019   LOYDENE SCHROCK to be D/C'd home per MD order.  AVS completed.  Copy for chart, and copy for patient signed, and dated. Patient/caregiver able to verbalize understanding.  Discharge Medication: Allergies as of 06/03/2019      Reactions   Codeine    Severe nausea.  Dizziness.      Medication List    STOP taking these medications   colchicine 0.6 MG tablet   CYSTEX URINARY PAIN RELIEF PO   naproxen 500 MG tablet Commonly known as: NAPROSYN   traMADol 50 MG tablet Commonly known as: ULTRAM     TAKE these medications   CALTRATE 600 PLUS-VIT D PO Take 600 mg by mouth daily.   hydrochlorothiazide 12.5 MG tablet Commonly known as: HYDRODIURIL Take 1 tablet by mouth every morning.   loratadine 10 MG tablet Commonly known as: CLARITIN Take 10 mg by mouth daily.   OCUVITE PO Take 1 tablet by mouth daily.   ondansetron 4 MG tablet Commonly known as: ZOFRAN Take 1 tablet (4 mg total) by mouth every 6 (six) hours as needed for nausea or vomiting.   pantoprazole 40 MG tablet Commonly known as: Protonix Take 1 tablet (40 mg total) by mouth 2 (two) times daily before a meal.   polyethylene glycol 17 g packet Commonly known as: MIRALAX / GLYCOLAX Take 17 g by mouth daily as needed for mild constipation.   quinapril 20 MG tablet Commonly known as: ACCUPRIL Take 20 mg by mouth 2 (two) times daily.   Restasis 0.05 % ophthalmic emulsion Generic drug: cycloSPORINE INSTILL 1 DROP INTO BOTH EYES TWICE A DAY   traZODone 50 MG tablet Commonly known as: DESYREL Take 1 tablet (50 mg total) by mouth at bedtime as needed for sleep.   TYLENOL ARTHRITIS PAIN PO Take 650 mg by mouth as needed.   VITAMIN D PO Take 1,000 Units by mouth daily.       Discharge Assessment: Vitals:   06/03/19 0758 06/03/19 1412  BP: (!) 147/82 109/69  Pulse: (!) 108 75  Resp: 16 19  Temp: 97.7 F (36.5 C) 98 F (36.7  C)  SpO2: 98% 96%   Skin clean, dry and intact without evidence of skin break down, no evidence of skin tears noted. IV catheter discontinued intact. Site without signs and symptoms of complications - no redness or edema noted at insertion site, patient denies c/o pain - only slight tenderness at site.  Dressing with slight pressure applied.  D/c Instructions-Education: Discharge instructions given to patient/family with verbalized understanding. D/c education completed with patient/family including follow up instructions, medication list, d/c activities limitations if indicated, with other d/c instructions as indicated by MD - patient able to verbalize understanding, all questions fully answered. Patient instructed to return to ED, call 911, or call MD for any changes in condition.  Patient escorted via [place], and D/C home via private auto.  Carney Corners, RN 06/03/2019 3:13 PM

## 2019-06-03 NOTE — Discharge Instructions (Addendum)
Gastrointestinal Bleeding Gastrointestinal (GI) bleeding is bleeding somewhere along the path that food travels through the body (digestive tract). This path is anywhere between the mouth and the opening of the butt (anus). You may have blood in your poop (stool) or have black poop. If you throw up (vomit), there may be blood in it. This condition can be mild, serious, or even life-threatening. If you have a lot of bleeding, you may need to stay in the hospital. What are the causes? This condition may be caused by:  Irritation and swelling of the esophagus (esophagitis). The esophagus is part of the body that moves food from your mouth to your stomach.  Swollen veins in the butt (hemorrhoids).  Areas of painful tearing in the opening of the butt (anal fissures). These are often caused by passing hard poop.  Pouches that form on the colon over time (diverticulosis).  Irritation and swelling (diverticulitis) in areas where pouches have formed on the colon.  Growths (polyps) or cancer. Colon cancer often starts out as growths that are not cancer.  Irritation of the stomach lining (gastritis).  Sores (ulcers) in the stomach. What increases the risk? You are more likely to develop this condition if you:  Have a certain type of infection in your stomach (Helicobacter pylori infection).  Take certain medicines.  Smoke.  Drink alcohol. What are the signs or symptoms? Common symptoms of this condition include:  Throwing up (vomiting) material that has bright red blood in it. It may look like coffee grounds.  Changes in your poop. The poop may: ? Have red blood in it. ? Be black, look like tar, and smell stronger than normal. ? Be red.  Pain or cramping in the belly (abdomen). How is this treated? Treatment for this condition depends on the cause of the bleeding. For example:  Sometimes, the bleeding can be stopped during a procedure that is done to find the problem (endoscopy  or colonoscopy).  Medicines can be used to: ? Help control irritation, swelling, or infection. ? Reduce acid in your stomach.  Certain problems can be treated with: ? Creams. ? Medicines that are put in the butt (suppositories). ? Warm baths.  Surgery is sometimes needed.  If you lose a lot of blood, you may need a blood transfusion. If bleeding is mild, you may be allowed to go home. If there is a lot of bleeding, you will need to stay in the hospital. Follow these instructions at home:   Take over-the-counter and prescription medicines only as told by your doctor.  Eat foods that have a lot of fiber in them. These foods include beans, whole grains, and fresh fruits and vegetables. You can also try eating 1-3 prunes each day.  Drink enough fluid to keep your pee (urine) pale yellow.  Keep all follow-up visits as told by your doctor. This is important. Contact a doctor if:  Your symptoms do not get better. Get help right away if:  Your bleeding does not stop.  You feel dizzy or you pass out (faint).  You feel weak.  You have very bad cramps in your back or belly.  You pass large clumps of blood (clots) in your poop.  Your symptoms are getting worse.  You have chest pain or fast heartbeats. Summary  GI bleeding is bleeding somewhere along the path that food travels through the body (digestive tract).  This bleeding can be caused by many things. Treatment depends on the cause of the bleeding.  Take medicines only as told by your doctor.  Keep all follow-up visits as told by your doctor. This is important. This information is not intended to replace advice given to you by your health care provider. Make sure you discuss any questions you have with your health care provider. Document Released: 04/22/2008 Document Revised: 02/24/2018 Document Reviewed: 02/24/2018 Elsevier Patient Education  2020 [place] ibuprofen/Advil/Aleve/Motrin/Goody  Powders/Naproxen/BC powders/Meloxicam/Diclofenac/Indomethacin and other Nonsteroidal anti-inflammatory medications as these will make you more likely to bleed and can cause stomach ulcers, can also cause Kidney problems.    Hypotension As your heart beats, it forces blood through your body. This force is called blood pressure. If you have hypotension, you have low blood pressure. When your blood pressure is too low, you may not get enough blood to your brain or other parts of your body. This may cause you to feel weak, light-headed, have a fast heartbeat, or even pass out (faint). Low blood pressure may be harmless, or it may cause serious problems. What are the causes?  Blood loss.  Not enough water in the body (dehydration).  Heart problems.  Hormone problems.  Pregnancy.  A very bad infection.  Not having enough of certain nutrients.  Very bad allergic reactions.  Certain medicines. What increases the risk?  Age. The risk increases as you get older.  Conditions that affect the heart or the brain and spinal cord (central nervous system).  Taking certain medicines.  Being pregnant. What are the signs or symptoms?  Feeling: ? Weak. ? Light-headed. ? Dizzy. ? Tired (fatigued).  Blurred vision.  Fast heartbeat.  Passing out, in very bad cases. How is this treated?  Changing your diet. This may involve eating more salt (sodium) or drinking more water.  Taking medicines to raise your blood pressure.  Changing how much you take (the dosage) of some of your medicines.  Wearing compression stockings. These stockings help to prevent blood clots and reduce swelling in your legs. In some cases, you may need to go to the hospital for:  Fluid replacement. This means you will receive fluids through an IV tube.  Blood replacement. This means you will receive donated blood through an IV tube (transfusion).  Treating an infection or heart problems, if this  applies.  Monitoring. You may need to be monitored while medicines that you are taking wear off. Follow these instructions at home: Eating and drinking   Drink enough fluids to keep your pee (urine) pale yellow.  Eat a healthy diet. Follow instructions from your doctor about what you can eat or drink. A healthy diet includes: ? Fresh fruits and vegetables. ? Whole grains. ? Low-fat (lean) meats. ? Low-fat dairy products.  Eat extra salt only as told. Do not add extra salt to your diet unless your doctor tells you to.  Eat small meals often.  Avoid standing up quickly after you eat. Medicines  Take over-the-counter and prescription medicines only as told by your doctor. ? Follow instructions from your doctor about changing how much you take of your medicines, if this applies. ? Do not stop or change any of your medicines on your own. General instructions   Wear compression stockings as told by your doctor.  Get up slowly from lying down or sitting.  Avoid hot showers and a lot of heat as told by your doctor.  Return to your normal activities as told by your doctor. Ask what activities are safe for you.  Do not use any  products that contain nicotine or tobacco, such as cigarettes, e-cigarettes, and chewing tobacco. If you need help quitting, ask your doctor.  Keep all follow-up visits as told by your doctor. This is important. Contact a doctor if:  You throw up (vomit).  You have watery poop (diarrhea).  You have a fever for more than 2-3 days.  You feel more thirsty than normal.  You feel weak and tired. Get help right away if:  You have chest pain.  You have a fast or uneven heartbeat.  You lose feeling (have numbness) in any part of your body.  You cannot move your arms or your legs.  You have trouble talking.  You get sweaty or feel light-headed.  You pass out.  You have trouble breathing.  You have trouble staying awake.  You feel mixed up  (confused). Summary  Hypotension is also called low blood pressure. It is when the force of blood pumping through your arteries is too weak.  Hypotension may be harmless, or it may cause serious problems.  Treatment may include changing your diet and medicines, and wearing compression stockings.  In very bad cases, you may need to go to the hospital. This information is not intended to replace advice given to you by your health care provider. Make sure you discuss any questions you have with your health care provider. Document Released: 10/08/2009 Document Revised: 01/07/2018 Document Reviewed: 01/07/2018 Elsevier Patient Education  [place].  2)Take Protonix 40 mg twice a day  3)Follow-up with Dr. Gala Romney, Cristopher Estimable, MD--- the gastroenterologist in about 6 weeks for recheck and reevaluation  4)Please follow-up with your primary care physician in 1 week for recheck, reevaluation and repeat complete blood count/CBC test  5)Please avoid coffee/caffeinated beverages, Tea, chocolate, carbonated drinks/soda,  spicy food, Milk,  Alcohol, acidic foods- such as citrus (Lemon, oranges), juices  and tomatoes, Meats with a high fat content. High-fat condiments and  Fried foods     Gastrointestinal Bleeding Gastrointestinal (GI) bleeding is bleeding somewhere along the path that food travels through the body (digestive tract). This path is anywhere between the mouth and the opening of the butt (anus). You may have blood in your poop (stool) or have black poop. If you throw up (vomit), there may be blood in it. This condition can be mild, serious, or even life-threatening. If you have a lot of bleeding, you may need to stay in the hospital. What are the causes? This condition may be caused by:  Irritation and swelling of the esophagus (esophagitis). The esophagus is part of the body that moves food from your mouth to your stomach.  Swollen veins in the butt (hemorrhoids).  Areas of  painful tearing in the opening of the butt (anal fissures). These are often caused by passing hard poop.  Pouches that form on the colon over time (diverticulosis).  Irritation and swelling (diverticulitis) in areas where pouches have formed on the colon.  Growths (polyps) or cancer. Colon cancer often starts out as growths that are not cancer.  Irritation of the stomach lining (gastritis).  Sores (ulcers) in the stomach. What increases the risk? You are more likely to develop this condition if you:  Have a certain type of infection in your stomach (Helicobacter pylori infection).  Take certain medicines.  Smoke.  Drink alcohol. What are the signs or symptoms? Common symptoms of this condition include:  Throwing up (vomiting) material that has bright red blood in it. It may look like coffee grounds.  Changes in your poop. The poop may: ? Have red blood in it. ? Be black, look like tar, and smell stronger than normal. ? Be red.  Pain or cramping in the belly (abdomen). How is this treated? Treatment for this condition depends on the cause of the bleeding. For example:  Sometimes, the bleeding can be stopped during a procedure that is done to find the problem (endoscopy or colonoscopy).  Medicines can be used to: ? Help control irritation, swelling, or infection. ? Reduce acid in your stomach.  Certain problems can be treated with: ? Creams. ? Medicines that are put in the butt (suppositories). ? Warm baths.  Surgery is sometimes needed.  If you lose a lot of blood, you may need a blood transfusion. If bleeding is mild, you may be allowed to go home. If there is a lot of bleeding, you will need to stay in the hospital. Follow these instructions at home:   Take over-the-counter and prescription medicines only as told by your doctor.  Eat foods that have a lot of fiber in them. These foods include beans, whole grains, and fresh fruits and vegetables. You can also  try eating 1-3 prunes each day.  Drink enough fluid to keep your pee (urine) pale yellow.  Keep all follow-up visits as told by your doctor. This is important. Contact a doctor if:  Your symptoms do not get better. Get help right away if:  Your bleeding does not stop.  You feel dizzy or you pass out (faint).  You feel weak.  You have very bad cramps in your back or belly.  You pass large clumps of blood (clots) in your poop.  Your symptoms are getting worse.  You have chest pain or fast heartbeats. Summary  GI bleeding is bleeding somewhere along the path that food travels through the body (digestive tract).  This bleeding can be caused by many things. Treatment depends on the cause of the bleeding.  Take medicines only as told by your doctor.  Keep all follow-up visits as told by your doctor. This is important. This information is not intended to replace advice given to you by your health care provider. Make sure you discuss any questions you have with your health care provider. Document Released: 04/22/2008 Document Revised: 02/24/2018 Document Reviewed: 02/24/2018 Elsevier Patient Education  [place].   Hypotension As your heart beats, it forces blood through your body. This force is called blood pressure. If you have hypotension, you have low blood pressure. When your blood pressure is too low, you may not get enough blood to your brain or other parts of your body. This may cause you to feel weak, light-headed, have a fast heartbeat, or even pass out (faint). Low blood pressure may be harmless, or it may cause serious problems. What are the causes?  Blood loss.  Not enough water in the body (dehydration).  Heart problems.  Hormone problems.  Pregnancy.  A very bad infection.  Not having enough of certain nutrients.  Very bad allergic reactions.  Certain medicines. What increases the risk?  Age. The risk increases as you get  older.  Conditions that affect the heart or the brain and spinal cord (central nervous system).  Taking certain medicines.  Being pregnant. What are the signs or symptoms?  Feeling: ? Weak. ? Light-headed. ? Dizzy. ? Tired (fatigued).  Blurred vision.  Fast heartbeat.  Passing out, in very bad cases. How is this treated?  Changing your diet.  This may involve eating more salt (sodium) or drinking more water.  Taking medicines to raise your blood pressure.  Changing how much you take (the dosage) of some of your medicines.  Wearing compression stockings. These stockings help to prevent blood clots and reduce swelling in your legs. In some cases, you may need to go to the hospital for:  Fluid replacement. This means you will receive fluids through an IV tube.  Blood replacement. This means you will receive donated blood through an IV tube (transfusion).  Treating an infection or heart problems, if this applies.  Monitoring. You may need to be monitored while medicines that you are taking wear off. Follow these instructions at home: Eating and drinking   Drink enough fluids to keep your pee (urine) pale yellow.  Eat a healthy diet. Follow instructions from your doctor about what you can eat or drink. A healthy diet includes: ? Fresh fruits and vegetables. ? Whole grains. ? Low-fat (lean) meats. ? Low-fat dairy products.  Eat extra salt only as told. Do not add extra salt to your diet unless your doctor tells you to.  Eat small meals often.  Avoid standing up quickly after you eat. Medicines  Take over-the-counter and prescription medicines only as told by your doctor. ? Follow instructions from your doctor about changing how much you take of your medicines, if this applies. ? Do not stop or change any of your medicines on your own. General instructions   Wear compression stockings as told by your doctor.  Get up slowly from lying down or sitting.  Avoid  hot showers and a lot of heat as told by your doctor.  Return to your normal activities as told by your doctor. Ask what activities are safe for you.  Do not use any products that contain nicotine or tobacco, such as cigarettes, e-cigarettes, and chewing tobacco. If you need help quitting, ask your doctor.  Keep all follow-up visits as told by your doctor. This is important. Contact a doctor if:  You throw up (vomit).  You have watery poop (diarrhea).  You have a fever for more than 2-3 days.  You feel more thirsty than normal.  You feel weak and tired. Get help right away if:  You have chest pain.  You have a fast or uneven heartbeat.  You lose feeling (have numbness) in any part of your body.  You cannot move your arms or your legs.  You have trouble talking.  You get sweaty or feel light-headed.  You pass out.  You have trouble breathing.  You have trouble staying awake.  You feel mixed up (confused). Summary  Hypotension is also called low blood pressure. It is when the force of blood pumping through your arteries is too weak.  Hypotension may be harmless, or it may cause serious problems.  Treatment may include changing your diet and medicines, and wearing compression stockings.  In very bad cases, you may need to go to the hospital.  --- 1)Avoid ibuprofen/Advil/Aleve/Motrin/Goody Powders/Naproxen/BC powders/Meloxicam/Diclofenac/Indomethacin and other Nonsteroidal anti-inflammatory medications as these will make you more likely to bleed and can cause stomach ulcers, can also cause Kidney problems.   2)Take Protonix 40 mg twice a day  3)Follow-up with Dr. Gala Romney, Cristopher Estimable, MD--- the gastroenterologist in about 6 weeks for recheck and reevaluation  4)Please follow-up with your primary care physician in 1 week for recheck, reevaluation and repeat complete blood count/CBC test  5)Please avoid coffee/caffeinated beverages, Tea, chocolate, carbonated  drinks/soda,  spicy food, Milk,  Alcohol, acidic foods- such as citrus (Lemon, oranges), juices  and tomatoes, Meats with a high fat content. High-fat condiments and  Fried foods

## 2019-06-03 NOTE — Care Management Important Message (Signed)
Important Message  Patient Details  Name: Doris Ford MRN: [id number] Date of Birth: [birth year]   Medicare Important Message Given:  Yes     Tommy Medal 06/03/2019, 1:09 PM

## 2019-06-03 NOTE — Discharge Summary (Signed)
Doris Ford, is a 78 y.o. female  DOB Apr 21, 1941  MRN [id number].  Admission date:  05/31/2019  Admitting Physician  Roxan Hockey, MD  Discharge Date:  06/03/2019   Primary MD  [name], [name], MD  Recommendations for primary care physician for things to follow:   - 1)Avoid ibuprofen/Advil/Aleve/Motrin/Goody Powders/Naproxen/BC powders/Meloxicam/Diclofenac/Indomethacin and other Nonsteroidal anti-inflammatory medications as these will make you more likely to bleed and can cause stomach ulcers, can also cause Kidney problems.   2)Take Protonix 40 mg twice a day  3)Follow-up with Dr. Gala Romney, Cristopher Estimable, MD--- the gastroenterologist in about 6 weeks for recheck and reevaluation  4)Please follow-up with your primary care physician in 1 week for recheck, reevaluation and repeat complete blood count/CBC test  5)Please avoid coffee/caffeinated beverages, Tea, chocolate, carbonated drinks/soda,  spicy food, Milk,  Alcohol, acidic foods- such as citrus (Lemon, oranges), juices  and tomatoes, Meats with a high fat content. High-fat condiments and  Fried foods    Admission Diagnosis  Hypotension, unspecified hypotension type [I95.9] Gastrointestinal hemorrhage, unspecified gastrointestinal hemorrhage type [K92.2]   Discharge Diagnosis  Hypotension, unspecified hypotension type [I95.9] Gastrointestinal hemorrhage, unspecified gastrointestinal hemorrhage type [K92.2]   Principal Problem:   Gastric ulcer with hemorrhage but without obstruction Active Problems:   GI bleed   Gastric ulcer with hemorrhage   HTN (hypertension)     Past Medical History:  Diagnosis Date  . Arthritis   . Cancer ([place]) 12/07   right breast/tx with lumpectomy and tamoxifen  . Hypertension    under control  . Interstitial cystitis   . Osteopenia    no change    Past Surgical History:  Procedure Laterality Date  .  BILATERAL SALPINGOOPHORECTOMY  age 75s  . BIOPSY  05/31/2019   Procedure: BIOPSY;  Surgeon: Daneil Dolin, MD;  Location: [facility];  Service: Endoscopy;;  . BREAST SURGERY Right    lumpectomy-multiple times  . BUNIONECTOMY WITH HAMMERTOE RECONSTRUCTION Bilateral ~2000  . CATARACT EXTRACTION Bilateral 2007  . ESOPHAGOGASTRODUODENOSCOPY N/A 05/31/2019   Procedure: ESOPHAGOGASTRODUODENOSCOPY (EGD);  Surgeon: Daneil Dolin, MD;  Location: [facility];  Service: Endoscopy;  Laterality: N/A;  . KNEE SURGERY Bilateral as teen  . TONSILLECTOMY AND ADENOIDECTOMY  age 33  . 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 TOTAL VAGINAL HYSTERECTOMY  age 45     HPI  from the history and physical done on the day of admission:    - Doris Ford  is a 78 y.o. female medical history relevant for HTN and history of breast cancer presents to the ED on 06/17/2019 with one episode of hematemesis/bright red blood followed by abdominal cramp and melanotic stools --In ED patient was hypotensive with systolic blood pressure of around 43mmhg -Responded well to IV fluids -Initial hemoglobin 12.6 from a baseline of 14  -Additional history obtained from patient's daughter at bedside  -  Apparently patient took naproxen in July and August after she had a foot fracture.... No NSAIDs since then  -Patient does not recall when she had her last colonoscopy but it was a while ago  No fever no chills  No urinary symptoms -No chest pains, no productive cough WBC of 13.1 noted  In ED--rectal exam by EDP with melanotic stool heme positive    Hospital Course:   -Brief Summary 78 y.o.femalemedical history relevant for HTN and history of breast cancer admitted on 06/17/2019 with one episode of hematemesis/bright red blood followed by abdominal  cramp and melanotic  EGD on 05/31/2019- Non-bleeding gastric ulcer with adherent clot.  Injected/ thermally sealed.. -Status post gastric mucosal biopsy   A/p 1)Acute GI bleed secondary to bleeding from gastric ulcer-status post EGD with intervention as above -Treated with IV Protonix per GI service for 72 hr- Then transition 40 mg bid -H&H stable, after initially drop  2)HTN-stable, resume home meds  3)Leukocytosis--suspect reactive in the setting of GI bleed, UA does not look infected -No respiratory symptoms WBC is down to 6.3 from 13.1 on admission  Disposition-discharge home with outpatient GI follow-up as advised  Code Status : full  Family Communication:   (patient is alert, awake and coherent) Daughter at bedside  Consults  :  Gi   Discharge Condition: stable  Follow UP--GI as advised Diet and Activity recommendation:  As advised  Discharge Instructions    Discharge Instructions    Call MD for:  difficulty breathing, headache or visual disturbances   Complete by: As directed    Call MD for:  persistant dizziness or light-headedness   Complete by: As directed    Call MD for:  persistant nausea and vomiting   Complete by: As directed    Call MD for:  severe uncontrolled pain   Complete by: As directed    Call MD for:  temperature >100.4   Complete by: As directed    Diet - low sodium heart healthy   Complete by: As directed    Soft diet advised   Discharge instructions   Complete by: As directed    1)Avoid ibuprofen/Advil/Aleve/Motrin/Goody Powders/Naproxen/BC powders/Meloxicam/Diclofenac/Indomethacin and other Nonsteroidal anti-inflammatory medications as these will make you more likely to bleed and can cause stomach ulcers, can also cause Kidney problems.   2)Take Protonix 40 mg twice a day  3)Follow-up with Dr. Gala Romney, Cristopher Estimable, MD--- the gastroenterologist number 6 weeks for recheck and  reevaluation  4)Please follow-up with your primary care physician in 1 week for recheck, reevaluation and repeat complete blood count/CBC test   5)Please avoid coffee/caffeinated beverages, Tea, chocolate, carbonated drinks/soda,  spicy food, Milk,  Alcohol, acidic foods- such as citrus (Lemon, oranges), juices  and tomatoes, Meats with a high fat content. High-fat condiments and  Fried foods   Increase activity slowly   Complete by: As directed       Discharge Medications     Allergies as of 06/03/2019      Reactions   Codeine    Severe nausea.  Dizziness.      Medication List    STOP taking these medications   colchicine 0.6 MG tablet   CYSTEX URINARY PAIN RELIEF PO   naproxen 500 MG tablet Commonly known as: NAPROSYN   traMADol 50 MG tablet Commonly known as: ULTRAM     TAKE these medications   CALTRATE 600 PLUS-VIT D PO Take 600 mg by mouth daily.   hydrochlorothiazide 12.5 MG tablet Commonly known as:  HYDRODIURIL Take 1 tablet by mouth every morning.   loratadine 10 MG tablet Commonly known as: CLARITIN Take 10 mg by mouth daily.   OCUVITE PO Take 1 tablet by mouth daily.   ondansetron 4 MG tablet Commonly known as: ZOFRAN Take 1 tablet (4 mg total) by mouth every 6 (six) hours as needed for nausea or vomiting.   pantoprazole 40 MG tablet Commonly known as: Protonix Take 1 tablet (40 mg total) by mouth 2 (two) times daily before a meal.   polyethylene glycol 17 g packet Commonly known as: MIRALAX / GLYCOLAX Take 17 g by mouth daily as needed for mild constipation.   quinapril 20 MG tablet Commonly known as: ACCUPRIL Take 20 mg by mouth 2 (two) times daily.   Restasis 0.05 % ophthalmic emulsion Generic drug: cycloSPORINE INSTILL 1 DROP INTO BOTH EYES TWICE A DAY   traZODone 50 MG tablet Commonly known as: DESYREL Take 1 tablet (50 mg total) by mouth at bedtime as needed for sleep.   TYLENOL ARTHRITIS PAIN PO Take 650 mg by mouth as needed.    VITAMIN D PO Take 1,000 Units by mouth daily.      Major procedures and Radiology Reports - PLEASE review detailed and final reports for all details, in brief -   No results found.  Micro Results   Recent Results (from the past 240 hour(s))  SARS Coronavirus 2 by RT PCR (hospital order, performed in [facility] hospital lab) Nasopharyngeal Nasopharyngeal Swab     Status: None   Collection Time: 05/31/19 11:50 AM   Specimen: Nasopharyngeal Swab  Result Value Ref Range Status   SARS Coronavirus 2 NEGATIVE NEGATIVE Final    Comment: (NOTE) If result is NEGATIVE SARS-CoV-2 target nucleic acids are NOT DETECTED. The SARS-CoV-2 RNA is generally detectable in upper and lower  respiratory specimens during the acute phase of infection. The lowest  concentration of SARS-CoV-2 viral copies this assay can detect is 250  copies / mL. A negative result does not preclude SARS-CoV-2 infection  and should not be used as the sole basis for treatment or other  patient management decisions.  A negative result may occur with  improper specimen collection / handling, submission of specimen other  than nasopharyngeal swab, presence of viral mutation(s) within the  areas targeted by this assay, and inadequate number of viral copies  (<250 copies / mL). A negative result must be combined with clinical  observations, patient history, and epidemiological information. If result is POSITIVE SARS-CoV-2 target nucleic acids are DETECTED. The SARS-CoV-2 RNA is generally detectable in upper and lower  respiratory specimens dur ing the acute phase of infection.  Positive  results are indicative of active infection with SARS-CoV-2.  Clinical  correlation with patient history and other diagnostic information is  necessary to determine patient infection status.  Positive results do  not rule out bacterial infection or co-infection with other viruses. If result is PRESUMPTIVE POSTIVE SARS-CoV-2 nucleic acids  MAY BE PRESENT.   A presumptive positive result was obtained on the submitted specimen  and confirmed on repeat testing.  While 2019 novel coronavirus  (SARS-CoV-2) nucleic acids may be present in the submitted sample  additional confirmatory testing may be necessary for epidemiological  and / or clinical management purposes  to differentiate between  SARS-CoV-2 and other Sarbecovirus currently known to infect humans.  If clinically indicated additional testing with an alternate test  methodology [phone]) is advised. The SARS-CoV-2 RNA is generally  detectable in  upper and lower respiratory sp ecimens during the acute  phase of infection. The expected result is Negative. Fact Sheet for Patients:  StrictlyIdeas.no Fact Sheet for Healthcare Providers: BankingDealers.co.za This test is not yet approved or cleared by the [place] FDA and has been authorized for detection and/or diagnosis of SARS-CoV-2 by FDA under an Emergency Use Authorization (EUA).  This EUA will remain in effect (meaning this test can be used) for the duration of the COVID-19 declaration under Section 564(b)(1) of the Act, 21 U.S.C. section 360bbb-3(b)(1), unless the authorization is terminated or revoked sooner. Performed at [facility], [address] [place], [place] [address]    Today   Subjective    Doris Ford today has no new complaints - No Nausea, Vomiting or Diarrhea       Patient has been seen and examined prior to discharge   Objective   Blood pressure 109/69, pulse 75, temperature 98 F (36.7 C), temperature source Oral, resp. rate 19, height 5\' 4"  (1.626 m), weight 51.8 kg, SpO2 96 %.   Intake/Output Summary (Last 24 hours) at 06/03/2019 1450 Last data filed at 06/03/2019 0505 Gross per 24 hour  Intake 1271.59 ml  Output 500 ml  Net 771.59 ml    Exam Gen:- Awake Alert, no acute distress  HEENT:- [place].AT, No sclera icterus  Neck-Supple Neck,No JVD,.  Lungs-  CTAB , good air movement bilaterally  CV- S1, S2 normal, regular Abd-  +ve B.Sounds, Abd Soft, resolved epigastric tenderness,    Extremity/Skin:- No  edema,   good pulses Psych-affect is appropriate, oriented x3 Neuro-no new focal deficits, no tremors    Data Review   CBC w Diff:  Lab Results  Component Value Date   WBC 7.1 06/03/2019   HGB 10.7 (L) 06/03/2019   HGB 14.3 12/13/2018   HCT 34.3 (L) 06/03/2019   HCT 39.5 12/13/2018   PLT 245 06/03/2019   PLT 239 12/13/2018   LYMPHOPCT 7 05/31/2019   MONOPCT 5 05/31/2019   EOSPCT 0 05/31/2019   BASOPCT 0 05/31/2019    CMP:  Lab Results  Component Value Date   NA 142 06/02/2019   K 3.5 06/02/2019   CL 110 06/02/2019   CO2 25 06/02/2019   BUN 17 06/02/2019   CREATININE 0.56 06/02/2019   PROT 6.8 05/31/2019   ALBUMIN 4.0 05/31/2019   BILITOT 0.5 05/31/2019   ALKPHOS 48 05/31/2019   AST 26 05/31/2019   ALT 20 05/31/2019  .   Total Discharge time is about 33 minutes  Roxan Hockey M.D on 06/03/2019 at 2:50 PM  Go to [email or web address] -  for contact info  [facility] - Office  [phone]

## 2019-06-06 ENCOUNTER — Other Ambulatory Visit: Payer: Self-pay | Admitting: *Deleted

## 2019-06-06 NOTE — Patient Outreach (Signed)
[facility]) Care Management  06/06/2019  SHANISHA YANO 07-09-1941 [id number]   EMMI-GENERAL DISCHARGE RED ON EMMI ALERT Day #1 Date: 06/05/2019 Red Alert Reason: No Scheduled follow up  Outreach #1 RN attempted outreach call however unsuccessful. RN able to leave a HIPAA approved voice message requesting a call back.  PLAN: Will further engage on the reported EMMI at that time. Will also send outreach letter and attempted another call within the next week.  Raina Mina, RN Care Management Coordinator [place] Office [phone]

## 2019-06-07 DIAGNOSIS — K922 Gastrointestinal hemorrhage, unspecified: Secondary | ICD-10-CM | POA: Diagnosis not present

## 2019-06-07 DIAGNOSIS — M545 Low back pain: Secondary | ICD-10-CM | POA: Diagnosis not present

## 2019-06-07 DIAGNOSIS — D500 Iron deficiency anemia secondary to blood loss (chronic): Secondary | ICD-10-CM | POA: Diagnosis not present

## 2019-06-07 DIAGNOSIS — R5381 Other malaise: Secondary | ICD-10-CM | POA: Diagnosis not present

## 2019-06-07 DIAGNOSIS — K259 Gastric ulcer, unspecified as acute or chronic, without hemorrhage or perforation: Secondary | ICD-10-CM | POA: Diagnosis not present

## 2019-06-09 ENCOUNTER — Other Ambulatory Visit: Payer: Self-pay | Admitting: *Deleted

## 2019-06-09 NOTE — Patient Outreach (Signed)
Doris Ford) Care Management  06/09/2019  Doris Ford January 31, 1941 [id number]    EMMI-GENERAL DISCHARGE RED ON EMMI ALERT Day # 1 Date: 06/05/2019 Red Alert Reason:SCHEDULE FOLLOW UP APPOINTMENT  Outreach #2 RN attempted outreach call today however pt was not available. RN requested to call back. Will plan another outreach call within the next 4 business days.  Raina Mina, RN Care Management Coordinator [place] Office [phone]

## 2019-06-09 NOTE — Patient Outreach (Signed)
[facility]) Care Management  06/09/2019  LICET EBNET [birth year] [id number]   NOTE PREVIOUS DAY #1 EMMI ON 06/05/2019 FOR SCHEDULE FOLLOW UP HAS BEEN RESOLVED.   EMMI-GENERAL DISCHARGE RED ON EMMI ALERT Day # 4 Date:06/08/2019 Red Alert Reason: SAD/HOPLESS/ANXIOUS/EMPTY    OUTREACH: RN spoke with pt today and verified pt is all clear on the reported above EMMI. Pt denies any sadness/hopless/anxious/empty. Pt states she answer NO but the way is captured it was incorrect. Pt has a pending appointment and states no other issues or needs at this time.  PLAN: Will close this case from any further calls or inquires.  Raina Mina, RN Care Management Coordinator [place] Office [phone]

## 2019-06-10 DIAGNOSIS — D500 Iron deficiency anemia secondary to blood loss (chronic): Secondary | ICD-10-CM | POA: Diagnosis not present

## 2019-06-10 DIAGNOSIS — K259 Gastric ulcer, unspecified as acute or chronic, without hemorrhage or perforation: Secondary | ICD-10-CM | POA: Diagnosis not present

## 2019-07-07 DIAGNOSIS — R69 Illness, unspecified: Secondary | ICD-10-CM | POA: Diagnosis not present

## 2019-07-29 ENCOUNTER — Emergency Department (HOSPITAL_COMMUNITY)
Admission: EM | Admit: 2019-07-29 | Discharge: 2019-07-29 | Disposition: A | Discharge status: Home / Self Care | Payer: Medicare HMO | Attending: Emergency Medicine | Admitting: Emergency Medicine

## 2019-07-29 ENCOUNTER — Encounter (HOSPITAL_COMMUNITY): Payer: Self-pay

## 2019-07-29 ENCOUNTER — Other Ambulatory Visit: Payer: Self-pay

## 2019-07-29 DIAGNOSIS — I10 Essential (primary) hypertension: Secondary | ICD-10-CM | POA: Diagnosis not present

## 2019-07-29 DIAGNOSIS — Z853 Personal history of malignant neoplasm of breast: Secondary | ICD-10-CM | POA: Diagnosis not present

## 2019-07-29 DIAGNOSIS — Z96641 Presence of right artificial hip joint: Secondary | ICD-10-CM | POA: Insufficient documentation

## 2019-07-29 DIAGNOSIS — R319 Hematuria, unspecified: Secondary | ICD-10-CM | POA: Diagnosis not present

## 2019-07-29 DIAGNOSIS — Z79899 Other long term (current) drug therapy: Secondary | ICD-10-CM | POA: Insufficient documentation

## 2019-07-29 DIAGNOSIS — I639 Cerebral infarction, unspecified: Secondary | ICD-10-CM

## 2019-07-29 HISTORY — DX: Gastrointestinal hemorrhage, unspecified: K92.2

## 2019-07-29 HISTORY — DX: Cerebral infarction, unspecified: I63.9

## 2019-07-29 LAB — CBC WITH DIFFERENTIAL/PLATELET
Abs Immature Granulocytes: 0.02 10*3/uL (ref 0.00–0.07)
Basophils Absolute: 0.1 10*3/uL (ref 0.0–0.1)
Basophils Relative: 1 %
Eosinophils Absolute: 0.1 10*3/uL (ref 0.0–0.5)
Eosinophils Relative: 1 %
HCT: 51.9 % — ABNORMAL HIGH (ref 36.0–46.0)
Hemoglobin: 16.3 g/dL — ABNORMAL HIGH (ref 12.0–15.0)
Immature Granulocytes: 0 %
Lymphocytes Relative: 26 %
Lymphs Abs: 1.9 10*3/uL (ref 0.7–4.0)
MCH: 31.8 pg (ref 26.0–34.0)
MCHC: 31.4 g/dL (ref 30.0–36.0)
MCV: 101.2 fL — ABNORMAL HIGH (ref 80.0–100.0)
Monocytes Absolute: 0.7 10*3/uL (ref 0.1–1.0)
Monocytes Relative: 10 %
Neutro Abs: 4.5 10*3/uL (ref 1.7–7.7)
Neutrophils Relative %: 62 %
Platelets: 272 10*3/uL (ref 150–400)
RBC: 5.13 MIL/uL — ABNORMAL HIGH (ref 3.87–5.11)
RDW: 12.6 % (ref 11.5–15.5)
WBC: 7.3 10*3/uL (ref 4.0–10.5)
nRBC: 0 % (ref 0.0–0.2)

## 2019-07-29 LAB — URINALYSIS, ROUTINE W REFLEX MICROSCOPIC
Bilirubin Urine: NEGATIVE
Glucose, UA: NEGATIVE mg/dL
Ketones, ur: NEGATIVE mg/dL
Leukocytes,Ua: NEGATIVE
Nitrite: NEGATIVE
Protein, ur: 100 mg/dL — AB
RBC / HPF: >50 RBC/hpf — ABNORMAL HIGH (ref 0–5)
Specific Gravity, Urine: 1.008 (ref 1.005–1.030)
WBC, UA: >50 WBC/hpf — ABNORMAL HIGH (ref 0–5)
pH: 6 (ref 5.0–8.0)

## 2019-07-29 LAB — BASIC METABOLIC PANEL
Anion gap: 12 (ref 5–15)
BUN: 20 mg/dL (ref 8–23)
CO2: 26 mmol/L (ref 22–32)
Calcium: 9.8 mg/dL (ref 8.9–10.3)
Chloride: 101 mmol/L (ref 98–111)
Creatinine, Ser: 0.7 mg/dL (ref 0.44–1.00)
GFR calc Af Amer: >60 mL/min (ref >60)
GFR calc non Af Amer: >60 mL/min (ref >60)
Glucose, Bld: 118 mg/dL — ABNORMAL HIGH (ref 70–99)
Potassium: 3.6 mmol/L (ref 3.5–5.1)
Sodium: 139 mmol/L (ref 135–145)

## 2019-07-29 LAB — CK: Total CK: 42 U/L (ref 38–234)

## 2019-07-29 LAB — APTT: aPTT: 28 seconds (ref 24–36)

## 2019-07-29 LAB — PROTIME-INR
INR: 0.9 (ref 0.8–1.2)
Prothrombin Time: 12 seconds (ref 11.4–15.2)

## 2019-07-29 MED ORDER — CEPHALEXIN 500 MG PO CAPS: 500.0000 mg | ORAL_CAPSULE | Freq: Two times a day (BID) | ORAL | 0 refills | Status: AC

## 2019-07-29 MED ORDER — CEPHALEXIN 500 MG PO CAPS
500.0000 mg | ORAL_CAPSULE | Freq: Once | ORAL | Status: AC
  Administered 2019-07-29: 10:00:00 500 mg via ORAL
  Filled 2019-07-29: qty 1

## 2019-07-29 MED ORDER — CEPHALEXIN 500 MG PO CAPS: 500.0000 mg | ORAL_CAPSULE | Freq: Two times a day (BID) | ORAL | 0 refills | Status: DC

## 2019-07-29 NOTE — Discharge Instructions (Signed)
Your testing reveals normal blood levels.  You are not losing blood at this time except for a small amount in your urine which is inconsequential.  This may be related to a urinary infection as we did find some bacteria in your urine.  We have sent for a urine culture which will take approximately 48 hours to return.  If you need an antibiotic other than the Keflex which I have prescribed we will call you to let you know.  Please take Keflex, 1 capsule twice a day for the next 7 days to treat for a potential infection.  Seek a medical exam for severe or worsening pain, fever, vomiting or any other severe or worsening symptoms, otherwise follow-up with your doctor this week

## 2019-07-29 NOTE — ED Provider Notes (Signed)
[place] Provider Note   CSN: [id number] Arrival date & time: 07/29/19  0813     History Chief Complaint  Patient presents with  . Hematuria    Doris Ford is a 79 y.o. female.  HPI   This patient is an 79 year old female, she has a known history of right-sided breast cancer status post treatment, history of well-controlled hypertension and a history of upper gastrointestinal bleeding, in fact she had been admitted to the hospital approximately 2 months ago with upper gastrointestinal bleeding, she had been given Protonix which she has continued to take.  Her EGD on November 3 of 2020 showed a nonbleeding gastric ulcer with adherent clot, this was treated by endoscopy and the patient did very well.  She was placed on iron and states that she continues to have some dark stools but they are well formed.  She noticed last night that she was having some darker urine with tinges of blood.  This was not painful and she has not had any frequency or urgency but as she got up overnight to urinate and then again this morning she noticed no changes in the color of her urine as it still continues to be a dark and red color.  She denies myalgias arthralgias or fevers or chills and has had no nausea vomiting or diarrhea.  She has not had any recent history of urinary tract infections and no history of bladder cancer.  She has not taken any medications for this prior to arrival.  Past Medical History:  Diagnosis Date  . Arthritis   . Cancer ([place]) 12/07   right breast/tx with lumpectomy and tamoxifen  . GI hemorrhage   . Hypertension    under control  . Interstitial cystitis   . Osteopenia    no change    Patient Active Problem List   Diagnosis Date Noted  . Gastric ulcer with hemorrhage but without obstruction 06/02/2019  . GI bleed 05/31/2019  . Gastric ulcer with hemorrhage 05/31/2019  . HTN (hypertension) 05/31/2019  . Arthritis of right hip 12/16/2013  .  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 Chronic interstitial cystitis 11/26/2011    Past Surgical History:  Procedure Laterality Date  . BILATERAL SALPINGOOPHORECTOMY  age 67s  . BIOPSY  05/31/2019   Procedure: BIOPSY;  Surgeon: Daneil Dolin, MD;  [facility];  Service: Endoscopy;;  . BREAST SURGERY Right    lumpectomy-multiple times  . BUNIONECTOMY WITH HAMMERTOE RECONSTRUCTION Bilateral ~2000  . CATARACT EXTRACTION Bilateral 2007  . ESOPHAGOGASTRODUODENOSCOPY N/A 05/31/2019   Procedure: ESOPHAGOGASTRODUODENOSCOPY (EGD);  Surgeon: Daneil Dolin, MD;  Location: [facility];  Service: Endoscopy;  Laterality: N/A;  . KNEE SURGERY Bilateral as teen  . TONSILLECTOMY AND ADENOIDECTOMY  age 30  . 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 TOTAL VAGINAL HYSTERECTOMY  age 72     OB History    Gravida  2   Para  2   Term      Preterm      AB      Living  2     SAB      TAB      Ectopic      Multiple      Live Births  Family History  Problem Relation Age of Onset  . Cancer Mother        breast/mastectomy  . Heart disease Mother        poor circulation  . Deep vein thrombosis Mother   . Osteoporosis Mother   . Hypertension Father   . Colon cancer Neg Hx     Social History   Tobacco Use  . Smoking status: Never Smoker  . Smokeless tobacco: Never Used  Substance Use Topics  . Alcohol use: No  . Drug use: No    Home Medications Prior to Admission medications   Medication Sig Start Date End Date Taking? Authorizing Provider  Acetaminophen (TYLENOL ARTHRITIS PAIN PO) Take 650 mg by mouth as needed.    [provider]  Calcium-Vitamin D (CALTRATE 600 PLUS-VIT D PO) Take 600 mg by mouth daily.    [provider]  cephALEXin (KEFLEX) 500 MG  capsule Take 1 capsule (500 mg total) by mouth 2 (two) times daily for 7 days. 07/29/19 08/05/19  Noemi Chapel, MD  Cholecalciferol (VITAMIN D PO) Take 1,000 Units by mouth daily.     [provider]  hydrochlorothiazide (HYDRODIURIL) 12.5 MG tablet Take 1 tablet by mouth every morning. 12/28/18   [provider]  loratadine (CLARITIN) 10 MG tablet Take 10 mg by mouth daily.    [provider]  Multiple Vitamins-Minerals (OCUVITE PO) Take 1 tablet by mouth daily.     [provider]  ondansetron (ZOFRAN) 4 MG tablet Take 1 tablet (4 mg total) by mouth every 6 (six) hours as needed for nausea or vomiting. 06/03/19   Roxan Hockey, MD  pantoprazole (PROTONIX) 40 MG tablet Take 1 tablet (40 mg total) by mouth 2 (two) times daily before a meal. 06/03/19 06/02/20  Emokpae, Courage, MD  polyethylene glycol (MIRALAX / GLYCOLAX) 17 g packet Take 17 g by mouth daily as needed for mild constipation. 06/03/19   Roxan Hockey, MD  quinapril (ACCUPRIL) 20 MG tablet Take 20 mg by mouth 2 (two) times daily.    [provider]  RESTASIS 0.05 % ophthalmic emulsion INSTILL 1 DROP INTO BOTH EYES TWICE A DAY 12/11/14   [provider]  traZODone (DESYREL) 50 MG tablet Take 1 tablet (50 mg total) by mouth at bedtime as needed for sleep. 06/03/19   Roxan Hockey, MD    Allergies    Codeine  Review of Systems   Review of Systems  All other systems reviewed and are negative.   Physical Exam Updated Vital Signs BP (!) 185/96 (BP Location: Right Arm)   Pulse 94   Temp 98 F (36.7 C) (Oral)   Resp 16   Ht 1.651 m (5\' 5" )   Wt 53.5 kg   LMP  (LMP Unknown)   SpO2 97%   BMI 19.64 kg/m   Physical Exam Vitals and nursing note reviewed.  Constitutional:      General: She is not in acute distress.    Appearance: She is well-developed.  HENT:     Head: Normocephalic and atraumatic.     Mouth/Throat:     Pharynx: No oropharyngeal exudate.  Eyes:      General: No scleral icterus.       Right eye: No discharge.        Left eye: No discharge.     Conjunctiva/sclera: Conjunctivae normal.     Pupils: Pupils are equal, round, and reactive to light.  Neck:     Thyroid: No thyromegaly.  Vascular: No JVD.  Cardiovascular:     Rate and Rhythm: Normal rate and regular rhythm.     Heart sounds: Normal heart sounds. No murmur. No friction rub. No gallop.   Pulmonary:     Effort: Pulmonary effort is normal. No respiratory distress.     Breath sounds: Normal breath sounds. No wheezing or rales.  Abdominal:     General: Bowel sounds are normal. There is no distension.     Palpations: Abdomen is soft. There is no mass.     Tenderness: There is no abdominal tenderness.     Comments: There is no abdominal tenderness to palpation  Genitourinary:    Comments: No CVA tenderness Musculoskeletal:        General: No tenderness. Normal range of motion.     Cervical back: Normal range of motion and neck supple.  Lymphadenopathy:     Cervical: No cervical adenopathy.  Skin:    General: Skin is warm and dry.     Findings: No erythema or rash.  Neurological:     Mental Status: She is alert.     Coordination: Coordination normal.  Psychiatric:        Behavior: Behavior normal.     ED Results / Procedures / Treatments   Labs (all labs ordered are listed, but only abnormal results are displayed) Labs Reviewed  URINALYSIS, ROUTINE W REFLEX MICROSCOPIC - Abnormal; Notable for the following components:      Result Value   Color, Urine AMBER (*)    Hgb urine dipstick LARGE (*)    Protein, ur 100 (*)    RBC / HPF >50 (*)    WBC, UA >50 (*)    Bacteria, UA RARE (*)    All other components within normal limits  CBC WITH DIFFERENTIAL/PLATELET - Abnormal; Notable for the following components:   RBC 5.13 (*)    Hemoglobin 16.3 (*)    HCT 51.9 (*)    MCV 101.2 (*)    All other components within normal limits  BASIC METABOLIC PANEL - Abnormal;  Notable for the following components:   Glucose, Bld 118 (*)    All other components within normal limits  URINE CULTURE  CK  PROTIME-INR  APTT    EKG None  Radiology No results found.  Procedures Procedures (including critical care time)  Medications Ordered in ED Medications  cephALEXin (KEFLEX) capsule 500 mg (has no administration in time range)    ED Course  I have reviewed the triage vital signs and the nursing notes.  Pertinent labs & imaging results that were available during my care of the patient were reviewed by me and considered in my medical decision making (see chart for details).  Clinical Course as of Jul 28 953  Fri Jul 29, 2019  [id number] Lab work shows no leukocytosis, no anemia and normal renal function.  Her creatinine kinase is 42 and the urinalysis reveals large blood, greater than 50 white blood cells in the presence of bacteria suggestive of a possible infection.  The patient is asymptomatic other than the appearance of her urine, culture has been sent, she will be treated with an antibiotic.   [BM]  [phone] The patient was noted to be hypertensive, she will need to take her daily medications but I do not think this has any plan her symptoms at this time, she was encouraged and instructed on care for home and agreeable to the plan   [BM]    Clinical Course User  Index [BM] Noemi Chapel, MD   MDM Rules/Calculators/A&P                      The patient is essentially asymptomatic with regards to subjective complaints.  She has had some intermittent hematuria over the last 24 hours, rule out rhabdo with urinalysis basic metabolic panel and a creatinine kinase, urinalysis with a culture pending, the patient is otherwise well-appearing, given her recent history of gastrointestinal bleeding will check INR, CBC to make sure she is not had recurrence of anemia.  Additionally with her dark-colored stools which I suspect are iron related this will help to elucidate  whether she is having ongoing GI bleeding.  Final Clinical Impression(s) / ED Diagnoses Final diagnoses:  Hematuria, unspecified type    Rx / DC Orders ED Discharge Orders         Ordered    cephALEXin (KEFLEX) 500 MG capsule  2 times daily     07/29/19 0954           Noemi Chapel, MD 07/29/19 [phone]

## 2019-07-29 NOTE — ED Triage Notes (Signed)
Pt reports blood in urine since yesterday. No pain or urinary symptoms

## 2019-07-30 LAB — URINE CULTURE: Culture: NO GROWTH

## 2019-08-01 ENCOUNTER — Telehealth: Payer: Self-pay | Admitting: Gastroenterology

## 2019-08-01 ENCOUNTER — Encounter: Payer: Self-pay | Admitting: Gastroenterology

## 2019-08-01 ENCOUNTER — Ambulatory Visit (INDEPENDENT_AMBULATORY_CARE_PROVIDER_SITE_OTHER): Payer: Medicare HMO | Admitting: Gastroenterology

## 2019-08-01 ENCOUNTER — Other Ambulatory Visit: Payer: Self-pay

## 2019-08-01 DIAGNOSIS — K254 Chronic or unspecified gastric ulcer with hemorrhage: Secondary | ICD-10-CM

## 2019-08-01 DIAGNOSIS — Z8711 Personal history of peptic ulcer disease: Secondary | ICD-10-CM

## 2019-08-01 DIAGNOSIS — Z8719 Personal history of other diseases of the digestive system: Secondary | ICD-10-CM

## 2019-08-01 NOTE — Progress Notes (Signed)
Primary Care Physician:  [name], [name], MD Primary GI:  Garfield Cornea, MD   Patient Location: Home  Provider Location: [facility] office  Reason for Visit:  Chief Complaint  Patient presents with  . hx gastric ulcer    doing fine.     Persons present on the virtual encounter, with roles: Patient, myself (provider),Mindy Estudillo (updated meds and allergies)  Total time (minutes) spent on medical discussion: 10 minutes  Due to COVID-19, visit was conducted using [email or web address] method.  Visit was requested by patient.  Virtual Visit via [email or web address]  I connected with Doris Ford on 08/01/19 at  9:30 AM EST by [email or web address] and verified that I am speaking with the correct person using two identifiers.   I discussed the limitations, risks, security and privacy concerns of performing an evaluation and management service by telephone/video and the availability of in person appointments. I also discussed with the patient that there may be a patient responsible charge related to this service. The patient expressed understanding and agreed to proceed.   HPI:   Doris Ford is a 79 y.o. female who presents for virtual visit regarding gastric ulcer.   Patient seen in 05/2019 during admission for GI bleeding. Presented with hematemesis and melena. She had been on naproxen 500mg  for couple of months for stress fracture in her right foot. Her hemoglobin on admission was 12.6 but nadir at 9.2 within first 24 hours. Seen in ED last week for gross hematuria and Hgb 16.3. treated for UTI.  EGD 05/31/2019: widely patent Schatzki ring, non-bleeding cratered gastric ulcer with adherent clot s/p injected/thermally sealed. Mottled gastric mucosa with benign biopsy. Recommended 3 month surveillance EGD.  Patient states it took about a month to get her strength back. She is still on iron. Stools are dark. No abdominal pain. Appetite is good. no constipation, diarrhea, dysphagia, vomiting. Seen in ED 07/29/19  due to gross hematuria. Still having bloody urine. No dysuria. Plans to follow up with PCP. Does not have a urologist. On antibiotic for possible UTI, urine culture negative.     Current Outpatient Medications  Medication Sig Dispense Refill  . Acetaminophen (TYLENOL ARTHRITIS PAIN PO) Take 650 mg by mouth as needed.    . Calcium-Vitamin D (CALTRATE 600 PLUS-VIT D PO) Take 600 mg by mouth daily.    . cephALEXin (KEFLEX) 500 MG capsule Take 1 capsule (500 mg total) by mouth 2 (two) times daily for 7 days. 14 capsule 0  . Cholecalciferol (VITAMIN D PO) Take 1,000 Units by mouth daily.     [name] docusate sodium (STOOL SOFTENER) 100 MG capsule Take 100 mg by mouth daily.    . ferrous sulfate 325 (65 FE) MG tablet Take 325 mg by mouth daily with breakfast.    . loratadine (CLARITIN) 10 MG tablet Take 10 mg by mouth daily.    . Multiple Vitamins-Minerals (OCUVITE PO) Take 1 tablet by mouth daily.     . NON FORMULARY Cysta-Q once a day    . pantoprazole (PROTONIX) 40 MG tablet Take 1 tablet (40 mg total) by mouth 2 (two) times daily before a meal. 60 tablet 3  . polyethylene glycol (MIRALAX / GLYCOLAX) 17 g packet Take 17 g by mouth daily as needed for mild constipation. 14 each 0  . quinapril (ACCUPRIL) 20 MG tablet Take 20 mg by mouth 2 (two) times daily.    . RESTASIS 0.05 % ophthalmic emulsion INSTILL 1 DROP INTO BOTH EYES TWICE  A DAY  6   No current facility-administered medications for this visit.    Past Medical History:  Diagnosis Date  . Arthritis   . Cancer ([place]) 12/07   right breast/tx with lumpectomy and tamoxifen  . GI hemorrhage   . Hypertension    under control  . Interstitial cystitis   . Osteopenia    no change    Past Surgical History:  Procedure Laterality Date  . BILATERAL SALPINGOOPHORECTOMY  age 1s  . BIOPSY  05/31/2019   Procedure: BIOPSY;  Surgeon: Daneil Dolin, MD;  [facility];  Service: Endoscopy;;  . BREAST SURGERY Right    lumpectomy-multiple  times  . BUNIONECTOMY WITH HAMMERTOE RECONSTRUCTION Bilateral ~2000  . CATARACT EXTRACTION Bilateral 2007  . ESOPHAGOGASTRODUODENOSCOPY N/A 05/31/2019   Dr. Gala Romney: widely patent Schatzki ring, non-bleeding cratered ulcer with adherent clot injected/thermally sealed. motled gastric mucosa with benign biopsy negative for H.pylori. plans for 3 month surveillance EGD.  [name] KNEE SURGERY Bilateral as teen  . TONSILLECTOMY AND ADENOIDECTOMY  age 53  . 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 TOTAL VAGINAL HYSTERECTOMY  age 89    Family History  Problem Relation Age of Onset  . Cancer Mother        breast/mastectomy  . Heart disease Mother        poor circulation  . Deep vein thrombosis Mother   . Osteoporosis Mother   . Hypertension Father   . Colon cancer Neg Hx     Social History   Socioeconomic History  . Marital status: Married    Spouse name: Not on file  . Number of children: Not on file  . Years of education: Not on file  . Highest education level: Not on file  Occupational History  . Not on file  Tobacco Use  . Smoking status: Never Smoker  . Smokeless tobacco: Never Used  Substance and Sexual Activity  . Alcohol use: No  . Drug use: No  . Sexual activity: Not Currently    Partners: Male    Birth control/protection: Surgical    Comment: [facility]  Other Topics Concern  . Not on file  Social History Narrative  . Not on file   Social Determinants of Health   Financial Resource Strain:   . Difficulty of Paying Living Expenses: Not on file  Food Insecurity:   . Worried About Charity fundraiser in the Last Year: Not on file  . Ran Out of Food in the Last Year: Not on file  Transportation Needs:   . Lack of Transportation (Medical): Not on file  . Lack of Transportation (Non-Medical): Not on file  Physical  Activity:   . Days of Exercise per Week: Not on file  . Minutes of Exercise per Session: Not on file  Stress:   . Feeling of Stress : Not on file  Social Connections:   . Frequency of Communication with Friends and Family: Not on file  . Frequency of Social Gatherings with Friends and Family: Not on file  . Attends Religious Services: Not on file  . Active Member of Clubs or Organizations: Not on file  . Attends Archivist Meetings: Not on file  . Marital Status: Not on file  Intimate Partner Violence:   . Fear of Current or Ex-Partner: Not on file  .  Emotionally Abused: Not on file  . Physically Abused: Not on file  . Sexually Abused: Not on file      ROS:  General: Negative for anorexia, weight loss, fever, chills, fatigue, weakness. Eyes: Negative for vision changes.  ENT: Negative for hoarseness, difficulty swallowing , nasal congestion. CV: Negative for chest pain, angina, palpitations, dyspnea on exertion, peripheral edema.  Respiratory: Negative for dyspnea at rest, dyspnea on exertion, cough, sputum, wheezing.  GI: See history of present illness. GU:  Negative for dysuria, urinary incontinence, urinary frequency, nocturnal urination. +hematuria. See hpi MS: Negative for joint pain, low back pain.  Derm: Negative for rash or itching.  Neuro: Negative for weakness, abnormal sensation, seizure, frequent headaches, memory loss, confusion.  Psych: Negative for anxiety, depression, suicidal ideation, hallucinations.  Endo: Negative for unusual weight change.  Heme: Negative for bruising or bleeding. Allergy: Negative for rash or hives.   Observations/Objective: Pleasant well nourished female in no acute distress. Color is good. No evidence of labored breathing.   Lab Results  Component Value Date   WBC 7.3 07/29/2019   HGB 16.3 (H) 07/29/2019   HCT 51.9 (H) 07/29/2019   MCV 101.2 (H) 07/29/2019   PLT 272 07/29/2019   Lab Results  Component Value Date    CREATININE 0.70 07/29/2019   BUN 20 07/29/2019   NA 139 07/29/2019   K 3.6 07/29/2019   CL 101 07/29/2019   CO2 26 07/29/2019   Lab Results  Component Value Date   ALT 20 05/31/2019   AST 26 05/31/2019   ALKPHOS 48 05/31/2019   BILITOT 0.5 05/31/2019   Lab Results  Component Value Date   INR 0.9 07/29/2019   INR 1.1 05/31/2019   INR 0.94 12/12/2013     Assessment and Plan: Pleasant 79 y/o female with history of UGI bleed in 05/2019 due to cratered gastric ulcer. Clinically doing well from GI standpoint. She will be due for 3 month surveillance endoscopy next month. Plan for conscious sedation.  I have discussed the risks, alternatives, benefits with regards to but not limited to the risk of reaction to medication, bleeding, infection, perforation and the patient is agreeable to proceed. Written consent to be obtained.  She will continue pantoprazole 40mg  daily for now, at least until her endoscopy.   Continue to avoid NSAIDS.  Continue iron for one more month.   Address hematuria with her PCP.   Follow Up Instructions:    I discussed the assessment and treatment plan with the patient. The patient was provided an opportunity to ask questions and all were answered. The patient agreed with the plan and demonstrated an understanding of the instructions. AVS mailed to patient's home address.   The patient was advised to call back or seek an in-person evaluation if the symptoms worsen or if the condition fails to improve as anticipated.  I provided 10 minutes of virtual face-to-face time during this encounter.   Neil Crouch, PA-C

## 2019-08-01 NOTE — Telephone Encounter (Signed)
Called pt, EGD w/RMR scheduled for 10/05/19 at 2:00pm. COVID test scheduled 10/03/19 at 11:00am. Appt letter mailed with procedure instructions. Orders entered.

## 2019-08-01 NOTE — Telephone Encounter (Signed)
Patient seen during virtual visit today. Needs EGD with RMR with conscious sedation for history of gastric ulcer. No sooner than February. Please schedule.

## 2019-08-01 NOTE — Patient Instructions (Signed)
1. Continue pantoprazole 40mg  daily at least until your upper endoscopy. You will be advised regarding further use after the procedure.  2. Avoid anti-inflammatories like naproxen (Aleve), ibuprofen (Advil), aspirin powders.  3. Continue iron for one more month.  4. Follow up with your PCP regarding blood in urine.  5. Upper endoscopy as scheduled. See separate instructions.

## 2019-08-12 ENCOUNTER — Telehealth: Payer: Self-pay | Admitting: Nurse Practitioner

## 2019-08-12 DIAGNOSIS — Z8616 Personal history of COVID-19: Secondary | ICD-10-CM | POA: Insufficient documentation

## 2019-08-12 DIAGNOSIS — U071 COVID-19: Secondary | ICD-10-CM | POA: Diagnosis not present

## 2019-08-12 DIAGNOSIS — Z20818 Contact with and (suspected) exposure to other bacterial communicable diseases: Secondary | ICD-10-CM | POA: Diagnosis not present

## 2019-08-12 DIAGNOSIS — J1282 Pneumonia due to coronavirus disease 2019: Secondary | ICD-10-CM | POA: Diagnosis not present

## 2019-08-12 DIAGNOSIS — R509 Fever, unspecified: Secondary | ICD-10-CM | POA: Diagnosis not present

## 2019-08-12 DIAGNOSIS — R05 Cough: Secondary | ICD-10-CM | POA: Diagnosis not present

## 2019-08-12 NOTE — Telephone Encounter (Signed)
Called to Discuss with patient about Covid symptoms and the use of bamlanivimab, a monoclonal antibody infusion for those with mild to moderate Covid symptoms and at a high risk of hospitalization.     Pt is qualified for this infusion at the [facility] infusion center due to co-morbid conditions and/or a member of an at-risk group.     Patient Active Problem List   Diagnosis Date Noted  . Gastric ulcer with hemorrhage but without obstruction 06/02/2019  . GI bleed 05/31/2019  . Gastric ulcer with hemorrhage 05/31/2019  . HTN (hypertension) 05/31/2019  . Arthritis of right hip 12/16/2013  . 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 Chronic interstitial cystitis 11/26/2011    Patient would like to think about it and call us back. Symptoms tier reviewed as well as criteria for ending isolation. Preventative practices reviewed. Patient verbalized understanding.    Patient advised to call back if he decides that he does want to get infusion. Callback number to the infusion center given. Patient advised to go to Urgent care or ED with severe symptoms.

## 2019-08-15 ENCOUNTER — Ambulatory Visit (HOSPITAL_COMMUNITY)
Admission: RE | Admit: 2019-08-15 | Discharge: 2019-08-15 | Disposition: A | Discharge status: Home / Self Care | Payer: Medicare Other | Source: Ambulatory Visit | Attending: Pulmonary Disease | Admitting: Pulmonary Disease

## 2019-08-15 ENCOUNTER — Other Ambulatory Visit: Payer: Self-pay | Admitting: Nurse Practitioner

## 2019-08-15 DIAGNOSIS — Z23 Encounter for immunization: Secondary | ICD-10-CM | POA: Diagnosis not present

## 2019-08-15 DIAGNOSIS — U071 COVID-19: Secondary | ICD-10-CM | POA: Diagnosis present

## 2019-08-15 MED ORDER — EPINEPHRINE 0.3 MG/0.3ML IJ SOAJ: 0.3000 mg | Freq: Once | INTRAMUSCULAR | Status: DC | PRN

## 2019-08-15 MED ORDER — SODIUM CHLORIDE 0.9 % IV SOLN
700.0000 mg | Freq: Once | INTRAVENOUS | Status: AC
  Administered 2019-08-15: 700 mg via INTRAVENOUS
  Filled 2019-08-15: qty 20

## 2019-08-15 MED ORDER — DIPHENHYDRAMINE HCL 50 MG/ML IJ SOLN: 50.0000 mg | Freq: Once | INTRAMUSCULAR | Status: DC | PRN

## 2019-08-15 MED ORDER — METHYLPREDNISOLONE SODIUM SUCC 125 MG IJ SOLR: 125.0000 mg | Freq: Once | INTRAMUSCULAR | Status: DC | PRN

## 2019-08-15 MED ORDER — SODIUM CHLORIDE 0.9 % IV SOLN
INTRAVENOUS | Status: DC | PRN
  Administered 2019-08-15: 250 mL via INTRAVENOUS

## 2019-08-15 MED ORDER — ALBUTEROL SULFATE HFA 108 (90 BASE) MCG/ACT IN AERS: 2.0000 | INHALATION_SPRAY | Freq: Once | RESPIRATORY_TRACT | Status: DC | PRN

## 2019-08-15 MED ORDER — FAMOTIDINE IN NACL 20-0.9 MG/50ML-% IV SOLN: 20.0000 mg | Freq: Once | INTRAVENOUS | Status: DC | PRN

## 2019-08-15 NOTE — Progress Notes (Signed)
  I connected by phone with Doris Ford on 08/15/2019 at 12:51 PM to discuss the potential use of an new treatment for mild to moderate COVID-19 viral infection in non-hospitalized patients.  This patient is a 79 y.o. female that meets the FDA criteria for Emergency Use Authorization of bamlanivimab or casirivimab\imdevimab.  Has a (+) direct SARS-CoV-2 viral test result  Has mild or moderate COVID-19   Is ? 81 years of age and weighs ? 40 kg  Is NOT hospitalized due to COVID-19  Is NOT requiring oxygen therapy or requiring an increase in baseline oxygen flow rate due to COVID-19  Is within 10 days of symptom onset  Has at least one of the high risk factor(s) for progression to severe COVID-19 and/or hospitalization as defined in EUA.  Specific high risk criteria : >/= 81 yo   I have spoken and communicated the following to the patient or parent/caregiver:  1. FDA has authorized the emergency use of bamlanivimab and casirivimab\imdevimab for the treatment of mild to moderate COVID-19 in adults and pediatric patients with positive results of direct SARS-CoV-2 viral testing who are 51 years of age and older weighing at least 40 kg, and who are at high risk for progressing to severe COVID-19 and/or hospitalization.  2. The significant known and potential risks and benefits of bamlanivimab and casirivimab\imdevimab, and the extent to which such potential risks and benefits are unknown.  3. Information on available alternative treatments and the risks and benefits of those alternatives, including clinical trials.  4. Patients treated with bamlanivimab and casirivimab\imdevimab should continue to self-isolate and use infection control measures (e.g., wear mask, isolate, social distance, avoid sharing personal items, clean and disinfect "high touch" surfaces, and frequent handwashing) according to CDC guidelines.   5. The patient or parent/caregiver has the option to accept or refuse  bamlanivimab or casirivimab\imdevimab .  After reviewing this information with the patient, The patient agreed to proceed with receiving the bamlanimivab infusion and will be provided a copy of the Fact sheet prior to receiving the infusion.Doris Ford 08/15/2019 12:51 PM

## 2019-08-15 NOTE — Progress Notes (Signed)
  Diagnosis: COVID-19  Physician: DR. Joya Gaskins  Procedure: [facility] Infusion Clinic Med: bamlanivimab infusion - Provided patient with bamlanimivab fact sheet for patients, parents and caregivers prior to infusion.  Complications: No immediate complications noted.  Discharge: Discharged home   [place] 08/15/2019

## 2019-08-15 NOTE — Discharge Instructions (Signed)
10 Things You Can Do to Manage Your COVID-19 Symptoms at Home If you have possible or confirmed COVID-19: 1. Stay home from work and school. And stay away from other public places. If you must go out, avoid using any kind of public transportation, ridesharing, or taxis. 2. Monitor your symptoms carefully. If your symptoms get worse, call your healthcare provider immediately. 3. Get rest and stay hydrated. 4. If you have a medical appointment, call the healthcare provider ahead of time and tell them that you have or may have COVID-19. 5. For medical emergencies, call 911 and notify the dispatch personnel that you have or may have COVID-19. 6. Cover your cough and sneezes with a tissue or use the inside of your elbow. 7. Wash your hands often with soap and water for at least 20 seconds or clean your hands with an alcohol-based hand sanitizer that contains at least 60% alcohol. 8. As much as possible, stay in a specific room and away from other people in your home. Also, you should use a separate bathroom, if available. If you need to be around other people in or outside of the home, wear a mask. 9. Avoid sharing personal items with other people in your household, like dishes, towels, and bedding. 10. Clean all surfaces that are touched often, like counters, tabletops, and doorknobs. Use household cleaning sprays or wipes according to the label instructions. [email or web address] 01/26/2019 This information is not intended to replace advice given to you by your health care provider. Make sure you discuss any questions you have with your health care provider. Document Revised: 06/30/2019 Document Reviewed: 06/30/2019 Elsevier Patient Education  2020 [place].   COVID-19 COVID-19 is a respiratory infection that is caused by a virus called severe acute respiratory syndrome coronavirus 2 (SARS-CoV-2). The disease is also known as coronavirus disease or novel coronavirus. In some people, the virus  may not cause any symptoms. In others, it may cause a serious infection. The infection can get worse quickly and can lead to complications, such as:  Pneumonia, or infection of the lungs.  Acute respiratory distress syndrome or ARDS. This is a condition in which fluid build-up in the lungs prevents the lungs from filling with air and passing oxygen into the blood.  Acute respiratory failure. This is a condition in which there is not enough oxygen passing from the lungs to the body or when carbon dioxide is not passing from the lungs out of the body.  Sepsis or septic shock. This is a serious bodily reaction to an infection.  Blood clotting problems.  Secondary infections due to bacteria or fungus.  Organ failure. This is when your body's organs stop working. The virus that causes COVID-19 is contagious. This means that it can spread from person to person through droplets from coughs and sneezes (respiratory secretions). What are the causes? This illness is caused by a virus. You may catch the virus by:  Breathing in droplets from an infected person. Droplets can be spread by a person breathing, speaking, singing, coughing, or sneezing.  Touching something, like a table or a doorknob, that was exposed to the virus (contaminated) and then touching your mouth, nose, or eyes. What increases the risk? Risk for infection You are more likely to be infected with this virus if you:  Are within 6 feet (2 meters) of a person with COVID-19.  Provide care for or live with a person who is infected with COVID-19.  Spend time in crowded indoor spaces or  live in shared housing. Risk for serious illness You are more likely to become seriously ill from the virus if you:  Are 11 years of age or older. The higher your age, the more you are at risk for serious illness.  Live in a nursing home or long-term care facility.  Have cancer.  Have a long-term (chronic) disease such as: ? Chronic lung  disease, including chronic obstructive pulmonary disease or asthma. ? A long-term disease that lowers your body's ability to fight infection (immunocompromised). ? Heart disease, including heart failure, a condition in which the arteries that lead to the heart become narrow or blocked (coronary artery disease), a disease which makes the heart muscle thick, weak, or stiff (cardiomyopathy). ? Diabetes. ? Chronic kidney disease. ? Sickle cell disease, a condition in which red blood cells have an abnormal "sickle" shape. ? Liver disease.  Are obese. What are the signs or symptoms? Symptoms of this condition can range from mild to severe. Symptoms may appear any time from 2 to 14 days after being exposed to the virus. They include:  A fever or chills.  A cough.  Difficulty breathing.  Headaches, body aches, or muscle aches.  Runny or stuffy (congested) nose.  A sore throat.  New loss of taste or smell. Some people may also have stomach problems, such as nausea, vomiting, or diarrhea. Other people may not have any symptoms of COVID-19. How is this diagnosed? This condition may be diagnosed based on:  Your signs and symptoms, especially if: ? You live in an area with a COVID-19 outbreak. ? You recently traveled to or from an area where the virus is common. ? You provide care for or live with a person who was diagnosed with COVID-19. ? You were exposed to a person who was diagnosed with COVID-19.  A physical exam.  Lab tests, which may include: ? Taking a sample of fluid from the back of your nose and throat (nasopharyngeal fluid), your nose, or your throat using a swab. ? A sample of mucus from your lungs (sputum). ? Blood tests.  Imaging tests, which may include, X-rays, CT scan, or ultrasound. How is this treated? At present, there is no medicine to treat COVID-19. Medicines that treat other diseases are being used on a trial basis to see if they are effective against  COVID-19. Your health care provider will talk with you about ways to treat your symptoms. For most people, the infection is mild and can be managed at home with rest, fluids, and over-the-counter medicines. Treatment for a serious infection usually takes places in a hospital intensive care unit (ICU). It may include one or more of the following treatments. These treatments are given until your symptoms improve.  Receiving fluids and medicines through an IV.  Supplemental oxygen. Extra oxygen is given through a tube in the nose, a face mask, or a hood.  Positioning you to lie on your stomach (prone position). This makes it easier for oxygen to get into the lungs.  Continuous positive airway pressure (CPAP) or bi-level positive airway pressure (BPAP) machine. This treatment uses mild air pressure to keep the airways open. A tube that is connected to a motor delivers oxygen to the body.  Ventilator. This treatment moves air into and out of the lungs by using a tube that is placed in your windpipe.  Tracheostomy. This is a procedure to create a hole in the neck so that a breathing tube can be inserted.  Extracorporeal membrane  oxygenation (ECMO). This procedure gives the lungs a chance to recover by taking over the functions of the heart and lungs. It supplies oxygen to the body and removes carbon dioxide. Follow these instructions at home: Lifestyle  If you are sick, stay home except to get medical care. Your health care provider will tell you how long to stay home. Call your health care provider before you go for medical care.  Rest at home as told by your health care provider.  Do not use any products that contain nicotine or tobacco, such as cigarettes, e-cigarettes, and chewing tobacco. If you need help quitting, ask your health care provider.  Return to your normal activities as told by your health care provider. Ask your health care provider what activities are safe for you. General  instructions  Take over-the-counter and prescription medicines only as told by your health care provider.  Drink enough fluid to keep your urine pale yellow.  Keep all follow-up visits as told by your health care provider. This is important. How is this prevented?  There is no vaccine to help prevent COVID-19 infection. However, there are steps you can take to protect yourself and others from this virus. To protect yourself:   Do not travel to areas where COVID-19 is a risk. The areas where COVID-19 is reported change often. To identify high-risk areas and travel restrictions, check the CDC travel website: wwwnc.cdc.gov/travel/notices  If you live in, or must travel to, an area where COVID-19 is a risk, take precautions to avoid infection. ? Stay away from people who are sick. ? Wash your hands often with soap and water for 20 seconds. If soap and water are not available, use an alcohol-based hand sanitizer. ? Avoid touching your mouth, face, eyes, or nose. ? Avoid going out in public, follow guidance from your state and local health authorities. ? If you must go out in public, wear a cloth face covering or face mask. Make sure your mask covers your nose and mouth. ? Avoid crowded indoor spaces. Stay at least 6 feet (2 meters) away from others. ? Disinfect objects and surfaces that are frequently touched every day. This may include:  Counters and tables.  Doorknobs and light switches.  Sinks and faucets.  Electronics, such as phones, remote controls, keyboards, computers, and tablets. To protect others: If you have symptoms of COVID-19, take steps to prevent the virus from spreading to others.  If you think you have a COVID-19 infection, contact your health care provider right away. Tell your health care team that you think you may have a COVID-19 infection.  Stay home. Leave your house only to seek medical care. Do not use public transport.  Do not travel while you are  sick.  Wash your hands often with soap and water for 20 seconds. If soap and water are not available, use alcohol-based hand sanitizer.  Stay away from other members of your household. Let healthy household members care for children and pets, if possible. If you have to care for children or pets, wash your hands often and wear a mask. If possible, stay in your own room, separate from others. Use a different bathroom.  Make sure that all people in your household wash their hands well and often.  Cough or sneeze into a tissue or your sleeve or elbow. Do not cough or sneeze into your hand or into the air.  Wear a cloth face covering or face mask. Make sure your mask covers your nose   and mouth. Where to find more information  Centers for Disease Control and Prevention: www.cdc.gov/coronavirus/2019-ncov/index.html  World Health Organization: www.who.int/health-topics/coronavirus Contact a health care provider if:  You live in or have traveled to an area where COVID-19 is a risk and you have symptoms of the infection.  You have had contact with someone who has COVID-19 and you have symptoms of the infection. Get help right away if:  You have trouble breathing.  You have pain or pressure in your chest.  You have confusion.  You have bluish lips and fingernails.  You have difficulty waking from sleep.  You have symptoms that get worse. These symptoms may represent a serious problem that is an emergency. Do not wait to see if the symptoms will go away. Get medical help right away. Call your local emergency services (911 in the [place] Do not drive yourself to the hospital. Let the emergency medical personnel know if you think you have COVID-19. Summary  COVID-19 is a respiratory infection that is caused by a virus. It is also known as coronavirus disease or novel coronavirus. It can cause serious infections, such as pneumonia, acute respiratory distress syndrome, acute respiratory failure,  or sepsis.  The virus that causes COVID-19 is contagious. This means that it can spread from person to person through droplets from breathing, speaking, singing, coughing, or sneezing.  You are more likely to develop a serious illness if you are 50 years of age or older, have a weak immune system, live in a nursing home, or have chronic disease.  There is no medicine to treat COVID-19. Your health care provider will talk with you about ways to treat your symptoms.  Take steps to protect yourself and others from infection. Wash your hands often and disinfect objects and surfaces that are frequently touched every day. Stay away from people who are sick and wear a mask if you are sick. This information is not intended to replace advice given to you by your health care provider. Make sure you discuss any questions you have with your health care provider. Document Revised: 05/13/2019 Document Reviewed: 08/19/2018 Elsevier Patient Education  2020 [employer]. 10 Things You Can Do to Manage Your COVID-19 Symptoms at Home If you have possible or confirmed COVID-19: 11. Stay home from work and school. And stay away from other public places. If you must go out, avoid using any kind of public transportation, ridesharing, or taxis. 12. Monitor your symptoms carefully. If your symptoms get worse, call your healthcare provider immediately. 13. Get rest and stay hydrated. 14. If you have a medical appointment, call the healthcare provider ahead of time and tell them that you have or may have COVID-19. 15. For medical emergencies, call 911 and notify the dispatch personnel that you have or may have COVID-19. 16. Cover your cough and sneezes with a tissue or use the inside of your elbow. 17. Wash your hands often with soap and water for at least 20 seconds or clean your hands with an alcohol-based hand sanitizer that contains at least 60% alcohol. 18. As much as possible, stay in a specific room and away from  other people in your home. Also, you should use a separate bathroom, if available. If you need to be around other people in or outside of the home, wear a mask. 19. Avoid sharing personal items with other people in your household, like dishes, towels, and bedding. 20. Clean all surfaces that are touched often, like counters, tabletops, and doorknobs. Use   household cleaning sprays or wipes according to the label instructions. [email or web address] 01/26/2019 This information is not intended to replace advice given to you by your health care provider. Make sure you discuss any questions you have with your health care provider. Document Revised: 06/30/2019 Document Reviewed: 06/30/2019 Elsevier Patient Education  [place]. What types of side effects do monoclonal antibody drugs cause?  Common side effects  In general, the more common side effects caused by monoclonal antibody drugs include: . Allergic reactions, such as hives or itching . Flu-like signs and symptoms, including chills, fatigue, fever, and muscle aches and pains . Nausea, vomiting . Diarrhea . Skin rashes . Low blood pressure   The CDC is recommending patients who receive monoclonal antibody treatments wait at least 90 days before being vaccinated.  Currently, there are no data on the safety and efficacy of mRNA COVID-19 vaccines in persons who received monoclonal antibodies or convalescent plasma as part of COVID-19 treatment. Based on the estimated half-life of such therapies as well as evidence suggesting that reinfection is uncommon in the 90 days after initial infection, vaccination should be deferred for at least 90 days, as a precautionary measure until additional information becomes available, to avoid interference of the antibody treatment with vaccine-induced immune responses.  What types of side effects do monoclonal antibody drugs cause?  Common side effects  In general, the more common side effects caused  by monoclonal antibody drugs include: . Allergic reactions, such as hives or itching . Flu-like signs and symptoms, including chills, fatigue, fever, and muscle aches and pains . Nausea, vomiting . Diarrhea . Skin rashes . Low blood pressure   The CDC is recommending patients who receive monoclonal antibody treatments wait at least 90 days before being vaccinated.  Currently, there are no data on the safety and efficacy of mRNA COVID-19 vaccines in persons who received monoclonal antibodies or convalescent plasma as part of COVID-19 treatment. Based on the estimated half-life of such therapies as well as evidence suggesting that reinfection is uncommon in the 90 days after initial infection, vaccination should be deferred for at least 90 days, as a precautionary measure until additional information becomes available, to avoid interference of the antibody treatment with vaccine-induced immune responses.

## 2019-08-31 DIAGNOSIS — J1282 Pneumonia due to coronavirus disease 2019: Secondary | ICD-10-CM | POA: Diagnosis not present

## 2019-08-31 DIAGNOSIS — H6591 Unspecified nonsuppurative otitis media, right ear: Secondary | ICD-10-CM | POA: Diagnosis not present

## 2019-09-13 DIAGNOSIS — H66004 Acute suppurative otitis media without spontaneous rupture of ear drum, recurrent, right ear: Secondary | ICD-10-CM | POA: Diagnosis not present

## 2019-09-13 DIAGNOSIS — H6501 Acute serous otitis media, right ear: Secondary | ICD-10-CM | POA: Diagnosis not present

## 2019-09-19 ENCOUNTER — Other Ambulatory Visit (HOSPITAL_COMMUNITY): Payer: Self-pay | Admitting: Obstetrics & Gynecology

## 2019-09-19 DIAGNOSIS — Z1231 Encounter for screening mammogram for malignant neoplasm of breast: Secondary | ICD-10-CM

## 2019-09-26 ENCOUNTER — Other Ambulatory Visit: Payer: Self-pay

## 2019-09-26 ENCOUNTER — Ambulatory Visit (INDEPENDENT_AMBULATORY_CARE_PROVIDER_SITE_OTHER): Payer: Medicare HMO | Admitting: Obstetrics & Gynecology

## 2019-09-26 ENCOUNTER — Encounter: Payer: Self-pay | Admitting: Obstetrics & Gynecology

## 2019-09-26 VITALS — BP 142/78 | HR 84 | Temp 97.3°F | Resp 12 | Ht 62.75 in | Wt 119.0 lb

## 2019-09-26 DIAGNOSIS — R310 Gross hematuria: Secondary | ICD-10-CM | POA: Diagnosis not present

## 2019-09-26 DIAGNOSIS — Z01419 Encounter for gynecological examination (general) (routine) without abnormal findings: Secondary | ICD-10-CM

## 2019-09-26 DIAGNOSIS — R3129 Other microscopic hematuria: Secondary | ICD-10-CM

## 2019-09-26 NOTE — Progress Notes (Signed)
79 y.o. G2P2 Married White or Caucasian female here for annual exam.  She had Covid in January.  She had had a lot of head congestion, weakness, fatigue.  She had IV infusion in 08/15/2019.  She's been advised not to be vaccinated until April 18.    Has endoscopy scheduled 3/310/2021.  She had issues with throwing up blood after taking anti-inflammatories for toe pain, stress fracture in right food.  This endoscopy is scheduled for follow-up from 05/31/2019 where an ulcer was noted.  Is on Protonix.  She is on iron due to the bleeding.  She is having some constipation with the iron.    Denies vaginal bleeding.    Had ER visit 07/29/2019 due to hematuria.  She was treated with Keflex 572m twice daily x 7 days.  Blood in urine seems to have gone away.  She did not have a urine culture.    No LMP recorded (lmp unknown). Patient has had a hysterectomy.          Sexually active: No.  The current method of family planning is status post hysterectomy.    Exercising: Yes.    yoga and tai chi Smoker:  no  Health Maintenance: Pap:  2011 Normal  History of abnormal Pap:  no MMG:  12/17/17 BIRADS 1 negative/density c -- scheduled 09/29/19 Colonoscopy:  11/2010 f/u 10 years  BMD:   2017 (will do with Dr. TOsborne Casco TDaP:  UTD with PCP Pneumonia vaccine(s):  06/03/19 Shingrix:   completed Hep C testing: n/a  Screening Labs: lab work done 07/2019   reports that she has never smoked. She has never used smokeless tobacco. She reports that she does not drink alcohol or use drugs.  Past Medical History:  Diagnosis Date  . Arthritis   . Cancer [place]   right breast/tx with lumpectomy and tamoxifen  . GI hemorrhage   . Hypertension    under control  . Interstitial cystitis   . Osteopenia    no change    Past Surgical History:  Procedure Laterality Date  . BILATERAL SALPINGOOPHORECTOMY  age [age]s . BIOPSY  05/31/2019   Procedure: BIOPSY;  Surgeon: RDaneil Dolin MD;  Location: [facility];   Service: Endoscopy;;  . BREAST SURGERY Right    lumpectomy-multiple times  . BUNIONECTOMY WITH HAMMERTOE RECONSTRUCTION Bilateral ~2000  . CATARACT EXTRACTION Bilateral 2007  . ESOPHAGOGASTRODUODENOSCOPY N/A 05/31/2019   Dr. RGala Romney widely patent Schatzki ring, non-bleeding cratered ulcer with adherent clot injected/thermally sealed. motled gastric mucosa with benign biopsy negative for H.pylori. plans for 3 month surveillance EGD.  .Marland KitchenKNEE SURGERY Bilateral as teen  . TONSILLECTOMY AND ADENOIDECTOMY  age 79 . TOTAL HIP ARTHROPLASTY Right 12/16/2013   Procedure: RIGHT TOTAL HIP ARTHROPLASTY ANTERIOR APPROACH;  Surgeon: CMcarthur Rossetti MD;  Location: [facility];  Service: Orthopedics;  Laterality: Right;  . TOTAL VAGINAL HYSTERECTOMY  age 79   Current Outpatient Medications  Medication Sig Dispense Refill  . Acetaminophen (TYLENOL ARTHRITIS PAIN PO) Take 650 mg by mouth as needed.    . Calcium-Vitamin D (CALTRATE 600 PLUS-VIT D PO) Take 600 mg by mouth daily.    . Cholecalciferol (VITAMIN D PO) Take 1,000 Units by mouth daily.     .Marland Kitchendocusate sodium (STOOL SOFTENER) 100 MG capsule Take 100 mg by mouth daily.    . ferrous sulfate 325 (65 FE) MG tablet Take 325 mg by mouth daily with breakfast.    . loratadine (CLARITIN) 10  MG tablet Take 10 mg by mouth daily.    . Multiple Vitamins-Minerals (OCUVITE PO) Take 1 tablet by mouth daily.     . NON FORMULARY Cysta-Q once a day    . pantoprazole (PROTONIX) 40 MG tablet Take 1 tablet (40 mg total) by mouth 2 (two) times daily before a meal. 60 tablet 3  . polyethylene glycol (MIRALAX / GLYCOLAX) 17 g packet Take 17 g by mouth daily as needed for mild constipation. 14 each 0  . quinapril (ACCUPRIL) 20 MG tablet Take 20 mg by mouth 2 (two) times daily.    . RESTASIS 0.05 % ophthalmic emulsion INSTILL 1 DROP INTO BOTH EYES TWICE A DAY  6   No current facility-administered medications for this visit.    Family History  Problem Relation Age of  Onset  . Cancer Mother        breast/mastectomy  . Heart disease Mother        poor circulation  . Deep vein thrombosis Mother   . Osteoporosis Mother   . Hypertension Father   . Colon cancer Neg Hx     Review of Systems  All other systems reviewed and are negative.   Exam:   BP (!) 142/78 (BP Location: Left Arm, Patient Position: Sitting, Cuff Size: Normal)   Pulse 84   Temp (!) 97.3 F (36.3 C) (Temporal)   Resp 12   Ht 5' 2.75" (1.594 m)   Wt 119 lb (54 kg)   LMP  (LMP Unknown)   BMI 21.25 kg/m   Height: 5' 2.75" (159.4 cm)  Ht Readings from Last 3 Encounters:  09/26/19 5' 2.75" (1.594 m)  07/29/19 5' 5"  (1.651 m)  05/31/19 5' 4"  (1.626 m)    General appearance: alert, cooperative and appears stated age Head: Normocephalic, without obvious abnormality, atraumatic Neck: no adenopathy, supple, symmetrical, trachea midline and thyroid normal to inspection and palpation Lungs: clear to auscultation bilaterally Breasts: normal appearance, no masses or tenderness Heart: regular rate and rhythm Abdomen: soft, non-tender; bowel sounds normal; no masses,  no organomegaly Extremities: extremities normal, atraumatic, no cyanosis or edema Skin: Skin color, texture, turgor normal. No rashes or lesions Lymph nodes: Cervical, supraclavicular, and axillary nodes normal. No abnormal inguinal nodes palpated Neurologic: Grossly normal   Pelvic: External genitalia:  no lesions              Urethra:  normal appearing urethra with no masses, tenderness or lesions              Bartholins and Skenes: normal                 Vagina: normal appearing vagina with normal color and discharge, no lesions              Cervix: absent              Pap taken: No. Bimanual Exam:  Uterus:  uterus absent              Adnexa: no mass, fullness, tenderness               Rectovaginal: Confirms               Anus:  normal sphincter tone, no lesions  Chaperone, Terence Lux, CMA, was present for  exam.  A:  Well Woman with normal exam PMP, no HRT H/o TVH/BSO Vaginal atrophy Hypertension Family hx of breast cancer in mother H/o right breast cancer s/p lumpectomy, Tamoxifen until  12/07 H/o gross hematuria  P:   Mammogram guidelines reviewed.  She is still doing yearly MMG.  Has appt scheduled 09/29/2019. pap smear not indicated Lab work done with Dr. Osborne Casco 10/2018 BMD is scheduled for April Urine micro pending Return annually or prn

## 2019-09-27 LAB — URINALYSIS, MICROSCOPIC ONLY: WBC, UA: >30 /hpf — AB (ref 0–5)

## 2019-09-28 ENCOUNTER — Other Ambulatory Visit: Payer: Self-pay | Admitting: *Deleted

## 2019-09-28 MED ORDER — NITROFURANTOIN MONOHYD MACRO 100 MG PO CAPS: 100.0000 mg | ORAL_CAPSULE | Freq: Two times a day (BID) | ORAL | 0 refills | Status: DC

## 2019-09-28 NOTE — Addendum Note (Signed)
Addended by: Megan Salon on: 09/28/2019 06:10 AM   Modules accepted: Orders

## 2019-09-29 ENCOUNTER — Ambulatory Visit (HOSPITAL_COMMUNITY)
Admission: RE | Admit: 2019-09-29 | Discharge: 2019-09-29 | Disposition: A | Discharge status: Home / Self Care | Payer: Medicare HMO | Source: Ambulatory Visit | Attending: Obstetrics & Gynecology | Admitting: Obstetrics & Gynecology

## 2019-09-29 ENCOUNTER — Other Ambulatory Visit: Payer: Self-pay

## 2019-09-29 DIAGNOSIS — Z1231 Encounter for screening mammogram for malignant neoplasm of breast: Secondary | ICD-10-CM | POA: Diagnosis not present

## 2019-10-03 ENCOUNTER — Ambulatory Visit (INDEPENDENT_AMBULATORY_CARE_PROVIDER_SITE_OTHER): Payer: Medicare HMO | Admitting: *Deleted

## 2019-10-03 ENCOUNTER — Other Ambulatory Visit: Payer: Self-pay

## 2019-10-03 ENCOUNTER — Other Ambulatory Visit (HOSPITAL_COMMUNITY)
Admission: RE | Admit: 2019-10-03 | Discharge: 2019-10-03 | Disposition: A | Discharge status: Home / Self Care | Payer: Medicare HMO | Source: Ambulatory Visit | Attending: Internal Medicine | Admitting: Internal Medicine

## 2019-10-03 ENCOUNTER — Ambulatory Visit: Payer: Self-pay

## 2019-10-03 DIAGNOSIS — R3129 Other microscopic hematuria: Secondary | ICD-10-CM

## 2019-10-03 DIAGNOSIS — Z01812 Encounter for preprocedural laboratory examination: Secondary | ICD-10-CM | POA: Insufficient documentation

## 2019-10-03 DIAGNOSIS — Z20822 Contact with and (suspected) exposure to covid-19: Secondary | ICD-10-CM | POA: Diagnosis not present

## 2019-10-03 NOTE — Progress Notes (Signed)
Patient here for follow up urine culture and micro. Patient states she completed the macrobid this am. Denies urinary symptoms. Clean catch urine provided and sent for UC and micro. Patient advised would be contacted with results once received and reviewed by Dr. Sabra Heck.   Routing to provider and will close encounter.

## 2019-10-04 ENCOUNTER — Telehealth: Payer: Self-pay | Admitting: *Deleted

## 2019-10-04 LAB — URINALYSIS, MICROSCOPIC ONLY
Bacteria, UA: NONE SEEN
Casts: NONE SEEN /lpf
Epithelial Cells (non renal): NONE SEEN /hpf (ref 0–10)
RBC, Urine: NONE SEEN /hpf (ref 0–2)
WBC, UA: NONE SEEN /hpf (ref 0–5)

## 2019-10-04 LAB — SARS CORONAVIRUS 2 (TAT 6-24 HRS): SARS Coronavirus 2: NEGATIVE

## 2019-10-04 NOTE — Telephone Encounter (Signed)
Received call from endo and wants to see if patient can move procedure time up to 1:00pm tomorrow.    Called pt and she was agreeable. Aware to arrive at 12:00pm. She can have clear liquids until 9:00am.. she voiced understanding. Called endo and LMOVM making aware

## 2019-10-05 ENCOUNTER — Other Ambulatory Visit: Payer: Self-pay

## 2019-10-05 ENCOUNTER — Ambulatory Visit (HOSPITAL_COMMUNITY)
Admission: RE | Admit: 2019-10-05 | Discharge: 2019-10-05 | Disposition: A | Discharge status: Home / Self Care | Payer: Medicare HMO | Attending: Internal Medicine | Admitting: Internal Medicine

## 2019-10-05 ENCOUNTER — Encounter (HOSPITAL_COMMUNITY): Payer: Self-pay | Admitting: Internal Medicine

## 2019-10-05 ENCOUNTER — Encounter (HOSPITAL_COMMUNITY)
Admission: RE | Disposition: A | Discharge status: Home / Self Care | Payer: Self-pay | Source: Home / Self Care | Attending: Internal Medicine

## 2019-10-05 DIAGNOSIS — Z79899 Other long term (current) drug therapy: Secondary | ICD-10-CM | POA: Diagnosis not present

## 2019-10-05 DIAGNOSIS — I10 Essential (primary) hypertension: Secondary | ICD-10-CM | POA: Insufficient documentation

## 2019-10-05 DIAGNOSIS — Z09 Encounter for follow-up examination after completed treatment for conditions other than malignant neoplasm: Secondary | ICD-10-CM | POA: Diagnosis present

## 2019-10-05 DIAGNOSIS — Z853 Personal history of malignant neoplasm of breast: Secondary | ICD-10-CM | POA: Insufficient documentation

## 2019-10-05 DIAGNOSIS — Z8711 Personal history of peptic ulcer disease: Secondary | ICD-10-CM

## 2019-10-05 DIAGNOSIS — K222 Esophageal obstruction: Secondary | ICD-10-CM | POA: Insufficient documentation

## 2019-10-05 DIAGNOSIS — Z8719 Personal history of other diseases of the digestive system: Secondary | ICD-10-CM

## 2019-10-05 DIAGNOSIS — Z96641 Presence of right artificial hip joint: Secondary | ICD-10-CM | POA: Insufficient documentation

## 2019-10-05 HISTORY — PX: ESOPHAGOGASTRODUODENOSCOPY: SHX5428

## 2019-10-05 LAB — URINE CULTURE: Organism ID, Bacteria: NO GROWTH

## 2019-10-05 SURGERY — EGD (ESOPHAGOGASTRODUODENOSCOPY)
Anesthesia: Moderate Sedation

## 2019-10-05 MED ORDER — LIDOCAINE VISCOUS HCL 2 % MT SOLN
OROMUCOSAL | Status: DC | PRN
  Administered 2019-10-05: 1 via OROMUCOSAL

## 2019-10-05 MED ORDER — SODIUM CHLORIDE 0.9 % IV SOLN: INTRAVENOUS | Status: DC

## 2019-10-05 MED ORDER — ONDANSETRON HCL 4 MG/2ML IJ SOLN
INTRAMUSCULAR | Status: AC
  Filled 2019-10-05: qty 2

## 2019-10-05 MED ORDER — MEPERIDINE HCL 100 MG/ML IJ SOLN
INTRAMUSCULAR | Status: DC | PRN
  Administered 2019-10-05: 25 mg

## 2019-10-05 MED ORDER — MIDAZOLAM HCL 5 MG/5ML IJ SOLN
INTRAMUSCULAR | Status: DC | PRN
  Administered 2019-10-05: 1 mg via INTRAVENOUS
  Administered 2019-10-05: 2 mg via INTRAVENOUS

## 2019-10-05 MED ORDER — MEPERIDINE HCL 50 MG/ML IJ SOLN
INTRAMUSCULAR | Status: AC
  Filled 2019-10-05: qty 1

## 2019-10-05 MED ORDER — LIDOCAINE VISCOUS HCL 2 % MT SOLN
OROMUCOSAL | Status: AC
  Filled 2019-10-05: qty 15

## 2019-10-05 MED ORDER — ONDANSETRON HCL 4 MG/2ML IJ SOLN
INTRAMUSCULAR | Status: DC | PRN
  Administered 2019-10-05: 4 mg via INTRAVENOUS

## 2019-10-05 MED ORDER — MIDAZOLAM HCL 5 MG/5ML IJ SOLN
INTRAMUSCULAR | Status: AC
  Filled 2019-10-05: qty 5

## 2019-10-05 NOTE — Discharge Instructions (Signed)
EGD Discharge instructions Please read the instructions outlined below and refer to this sheet in the next few weeks. These discharge instructions provide you with general information on caring for yourself after you leave the hospital. Your doctor may also give you specific instructions. While your treatment has been planned according to the most current medical practices available, unavoidable complications occasionally occur. If you have any problems or questions after discharge, please call your doctor. ACTIVITY  You may resume your regular activity but move at a slower pace for the next 24 hours.   Take frequent rest periods for the next 24 hours.   Walking will help expel (get rid of) the air and reduce the bloated feeling in your abdomen.   No driving for 24 hours (because of the anesthesia (medicine) used during the test).   You may shower.   Do not sign any important legal documents or operate any machinery for 24 hours (because of the anesthesia used during the test).  NUTRITION  Drink plenty of fluids.   You may resume your normal diet.   Begin with a light meal and progress to your normal diet.   Avoid alcoholic beverages for 24 hours or as instructed by your caregiver.  MEDICATIONS  You may resume your normal medications unless your caregiver tells you otherwise.  WHAT YOU CAN EXPECT TODAY  You may experience abdominal discomfort such as a feeling of fullness or gas pains.  FOLLOW-UP  Your doctor will discuss the results of your test with you.  SEEK IMMEDIATE MEDICAL ATTENTION IF ANY OF THE FOLLOWING OCCUR:  Excessive nausea (feeling sick to your stomach) and/or vomiting.   Severe abdominal pain and distention (swelling).   Trouble swallowing.   Temperature over 101 F (37.8 C).   Rectal bleeding or vomiting of blood.    Previously noted ulcer completely healed  Avoid all NSAIDs like Naprosyn and ibuprofen in the future  May stop pantoprazole  entirely as long as you do not take any more NSAIDs  Return to my office as needed.  At patient request, I called [name] at [phone] and left a message with the results

## 2019-10-05 NOTE — H&P (Signed)
@LOGO @   Primary Care Physician:  [name], [name], MD Primary Gastroenterologist:  Dr. Gala Romney  Pre-Procedure History & Physical: HPI:  Doris Ford is a 79 y.o. female here for for surveillance EGD.  History of a non-H. pylori related gastric ulcer (path) presenting as upper GI bleed requiring therapeutic intervention in November 2020.  She was taking Naprosyn.  Clinically doing well now.  Here for surveillance examination.  Past Medical History:  Diagnosis Date  . Arthritis   . Cancer ([facility]) 12/07   right breast/tx with lumpectomy and tamoxifen  . GI hemorrhage   . Hypertension    under control  . Interstitial cystitis   . Osteopenia    no change    Past Surgical History:  Procedure Laterality Date  . BILATERAL SALPINGOOPHORECTOMY  age 33s  . BIOPSY  05/31/2019   Procedure: BIOPSY;  Surgeon: Daneil Dolin, MD;  Location: [facility];  Service: Endoscopy;;  . BREAST SURGERY Right    lumpectomy-multiple times  . BUNIONECTOMY WITH HAMMERTOE RECONSTRUCTION Bilateral ~2000  . CATARACT EXTRACTION Bilateral 2007  . ESOPHAGOGASTRODUODENOSCOPY N/A 05/31/2019   Dr. Gala Romney: widely patent Schatzki ring, non-bleeding cratered ulcer with adherent clot injected/thermally sealed. motled gastric mucosa with benign biopsy negative for H.pylori. plans for 3 month surveillance EGD.  [name] KNEE SURGERY Bilateral as teen  . TONSILLECTOMY AND ADENOIDECTOMY  age 79  . 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 TOTAL VAGINAL HYSTERECTOMY  age 57    Prior to Admission medications   Medication Sig Start Date End Date Taking? Authorizing Provider  acetaminophen (TYLENOL) 650 MG CR tablet Take 650-1,300 mg by mouth 2 (two) times daily as needed for pain.   Yes [provider]  Ascorbic Acid (VITAMIN C) 1000 MG tablet Take 1,000 mg  by mouth daily.   Yes [provider]  beta carotene w/minerals (OCUVITE) tablet Take 1 tablet by mouth daily.   Yes [provider]  Calcium-Vitamin D (CALTRATE 600 PLUS-VIT D PO) Take 1 tablet by mouth daily.    Yes [provider]  carboxymethylcellul-glycerin (OPTIVE) 0.5-0.9 % ophthalmic solution Place 1 drop into both eyes daily as needed for dry eyes.   Yes [provider]  Cholecalciferol (VITAMIN D3) 50 MCG (2000 UT) TABS Take 2,000 Units by mouth daily.   Yes [provider]  docusate sodium (STOOL SOFTENER) 100 MG capsule Take 100 mg by mouth daily as needed (constipation.).    Yes [provider]  fluticasone (FLONASE) 50 MCG/ACT nasal spray Place 1-2 sprays into both nostrils daily as needed for allergies or rhinitis.   Yes [provider]  loratadine (CLARITIN) 10 MG tablet Take 10 mg by mouth daily.   Yes [provider]  NON FORMULARY Take 1 capsule by mouth daily. Cysta-Q   Yes [provider]  pantoprazole (PROTONIX) 40 MG tablet Take 1 tablet (40 mg total) by mouth 2 (two) times daily before a meal. 06/03/19 06/02/20 Yes Emokpae, Courage, MD  polyethylene glycol (MIRALAX / GLYCOLAX) 17 g packet Take 17 g by mouth daily as needed for mild constipation. 06/03/19  Yes Roxan Hockey, MD  Probiotic Product (ALIGN) CHEW Chew 2 tablets by mouth daily.   Yes [provider]  quinapril (ACCUPRIL) 20 MG tablet Take 20 mg by mouth 2 (two) times daily.   Yes [provider]    Allergies as of 08/01/2019 - Review Complete 08/01/2019  Allergen Reaction Noted  . Codeine  01/10/2014    Family History  Problem Relation Age of Onset  . Cancer Mother        breast/mastectomy  . Heart disease Mother        poor circulation  . Deep vein thrombosis Mother   . Osteoporosis Mother   . Hypertension Father   . Colon cancer Neg Hx     Social History   Socioeconomic History  . Marital status:  Married    Spouse name: Not on file  . Number of children: Not on file  . Years of education: Not on file  . Highest education level: Not on file  Occupational History  . Not on file  Tobacco Use  . Smoking status: Never Smoker  . Smokeless tobacco: Never Used  Substance and Sexual Activity  . Alcohol use: No  . Drug use: No  . Sexual activity: Not Currently    Partners: Male    Birth control/protection: Surgical    Comment: [facility]  Other Topics Concern  . Not on file  Social History Narrative  . Not on file   Social Determinants of Health   Financial Resource Strain:   . Difficulty of Paying Living Expenses: Not on file  Food Insecurity:   . Worried About Charity fundraiser in the Last Year: Not on file  . Ran Out of Food in the Last Year: Not on file  Transportation Needs:   . Lack of Transportation (Medical): Not on file  . Lack of Transportation (Non-Medical): Not on file  Physical Activity:   . Days of Exercise per Week: Not on file  . Minutes of Exercise per Session: Not on file  Stress:   . Feeling of Stress : Not on file  Social Connections:   . Frequency of Communication with Friends and Family: Not on file  . Frequency of Social Gatherings with Friends and Family: Not on file  . Attends Religious Services: Not on file  . Active Member of Clubs or Organizations: Not on file  . Attends Archivist Meetings: Not on file  . Marital Status: Not on file  Intimate Partner Violence:   . Fear of Current or Ex-Partner: Not on file  . Emotionally Abused: Not on file  . Physically Abused: Not on file  . Sexually Abused: Not on file    Review of Systems: See HPI, otherwise negative ROS  Physical Exam: BP (!) 158/84   Pulse 88   Temp 97.7 F (36.5 C) (Oral)   Resp 17   Ht 5\' 2"  (1.575 m)   Wt 53.5 kg   LMP  (LMP Unknown)   SpO2 98%   BMI 21.58 kg/m  General:   Alert,  Well-developed, well-nourished, pleasant and cooperative in NAD Neck:  Supple;  no masses or thyromegaly. No significant cervical adenopathy. Lungs:  Clear throughout to auscultation.   No wheezes, crackles, or rhonchi. No acute distress. Heart:  Regular rate and rhythm; no murmurs, clicks, rubs,  or gallops. Abdomen: Non-distended, normal bowel sounds.  Soft and nontender without appreciable mass or hepatosplenomegaly.  Pulses:  Normal pulses noted. Extremities:  Without clubbing or edema.  Impression/Plan: Pleasant 79 year old lady here for surveillance EGD.  History of upper GI bleed secondary NSAID induced gastric ulcer last fall.  No GI symptoms at this time.  I have offered the patient a surveillance EGD per  plan.  The risks, benefits, limitations, alternatives and imponderables have been reviewed with the patient. Potential for esophageal dilation, biopsy, etc. have also been reviewed.  Questions have been answered. All parties agreeable.     Notice: This dictation was prepared with Dragon dictation along with smaller phrase technology. Any transcriptional errors that result from this process are unintentional and may not be corrected upon review.

## 2019-10-05 NOTE — Op Note (Signed)
[facility] Patient Name: Doris Ford Procedure Date: 10/05/2019 12:45 PM MRN: [id number] Date of Birth: [birth year] Attending MD: Norvel Richards , MD CSN: [id number] Age: 79 Admit Type: Outpatient Procedure:                Upper GI endoscopy Indications:              Surveillance procedure; Follow up gastric ulcer Providers:                Norvel Richards, MD, Janeece Riggers, RN, Nelma Rothman, Technician Referring MD:              Medicines:                Midazolam 3 mg IV, Meperidine 25 mg IV, Ondansetron                            4 mg IV Complications:            No immediate complications. Estimated Blood Loss:     Estimated blood loss: none. Procedure:                Pre-Anesthesia Assessment:                           - Prior to the procedure, a History and Physical                            was performed, and patient medications and                            allergies were reviewed. The patient's tolerance of                            previous anesthesia was also reviewed. The risks                            and benefits of the procedure and the sedation                            options and risks were discussed with the patient.                            All questions were answered, and informed consent                            was obtained. Prior Anticoagulants: The patient has                            taken no previous anticoagulant or antiplatelet                            agents. ASA Grade Assessment: II - A patient with  mild systemic disease. After reviewing the risks                            and benefits, the patient was deemed in                            satisfactory condition to undergo the procedure.                           After obtaining informed consent, the endoscope was                            passed under direct vision. Throughout the                            procedure,  the patient's blood pressure, pulse, and                            oxygen saturations were monitored continuously. The                            [id number]) was introduced through the                            mouth, and advanced to the second part of duodenum.                            The upper GI endoscopy was accomplished without                            difficulty. The patient tolerated the procedure                            well. Scope In: 12:54:43 PM Scope Out: 12:58:46 PM Total Procedure Duration: 0 hours 4 minutes 3 seconds  Findings:      A non-obstructing Schatzki ring was found at the gastroesophageal       junction.      The exam was otherwise without abnormality.      The entire examined stomach was normal aside from area of antral scar       representing site of previously noted gastric ulcer which has completely       healed.      The duodenal bulb and second portion of the duodenum were normal. Impression:               - Non-obstructing Schatzki ring.                           - The examination was otherwise normal.                           -Previously noted gastric ulcer is completely healed                           - Normal duodenal bulb and second portion of the  duodenum.                           - No specimens collected. Moderate Sedation:      Moderate (conscious) sedation was administered by the endoscopy nurse       and supervised by the endoscopist. The following parameters were       monitored: oxygen saturation, heart rate, blood pressure, respiratory       rate, EKG, adequacy of pulmonary ventilation, and response to care.       Total physician intraservice time was 13 minutes. Recommendation:           - Patient has a contact number available for                            emergencies. The signs and symptoms of potential                            delayed complications were discussed with the                             patient. Return to normal activities tomorrow.                            Written discharge instructions were provided to the                            patient.                           - Advance diet as tolerated. Avoid nonsteroidal                            agents definitely. If nonsteroidal agents are                            avoided, may stop taking pantoprazole. Return to my                            office on an as-needed basis. Procedure Code(s):        --- Professional ---                           [phone], Esophagogastroduodenoscopy, flexible,                            transoral; diagnostic, including collection of                            specimen(s) by brushing or washing, when performed                            (separate procedure)                           G0500, Moderate sedation services provided by the  same physician or other qualified health care                            professional performing a gastrointestinal                            endoscopic service that sedation supports,                            requiring the presence of an independent trained                            observer to assist in the monitoring of the                            patient's level of consciousness and physiological                            status; initial 15 minutes of intra-service time;                            patient age 25 years or older (additional time may                            be reported with [phone], as appropriate) Diagnosis Code(s):        --- Professional ---                           K22.2, Esophageal obstruction CPT copyright 2019 [employer] Medical Association. All rights reserved. The codes documented in this report are preliminary and upon coder review may  be revised to meet current compliance requirements. Cristopher Estimable. Osamah Schmader, MD Norvel Richards, MD 10/05/2019 1:08:40 PM This report has been signed  electronically. Number of Addenda: 0

## 2019-10-06 ENCOUNTER — Other Ambulatory Visit: Payer: Self-pay | Admitting: Obstetrics & Gynecology

## 2019-10-06 MED ORDER — BETAMETHASONE DIPROPIONATE AUG 0.05 % EX CREA: TOPICAL_CREAM | Freq: Two times a day (BID) | CUTANEOUS | 0 refills | Status: DC

## 2019-10-10 ENCOUNTER — Telehealth: Payer: Self-pay

## 2019-10-10 NOTE — Telephone Encounter (Signed)
Patient called to let Dr. Sabra Heck know about the medication cream BETAMETHASONE DP .05%. Patient stated the rash is still there but is doing better.

## 2019-10-10 NOTE — Telephone Encounter (Signed)
It's safe to use at least 7-10 days so I'd keep using it until it's gone.  Can you put in the skin eruption medication allergy?  Thanks.

## 2019-10-10 NOTE — Telephone Encounter (Signed)
Spoke with patient. Advised per Dr. Sabra Heck. Allergies updated, confirmed abx taken was Macrobid.   Patient verbalizes understanding and is agreeable.   Encounter closed.

## 2019-10-10 NOTE — Telephone Encounter (Signed)
Spoke with patient. Patient calling to provide update. Patient states she  was prescribed Rx for topical cream for what she thought was a reaction to abx, started medication on 3/11. Patient reports symptoms have improved, not completely resolved. Denies itching, wheezing, SOB. Denies any new symptoms. Patient reports most of the redness in from pelvis, down both extremities to the tops of her feet.  Patient is unsure how long she needs to use the topical cream.   Advised patient ok to continue Rx for now, will provide update to Dr. Sabra Heck and f/u with recommendations. ER precautions reviewed for new or worsening symptoms.   Routing to Dr. Sabra Heck to review.

## 2019-11-03 DIAGNOSIS — Z0100 Encounter for examination of eyes and vision without abnormal findings: Secondary | ICD-10-CM | POA: Diagnosis not present

## 2019-11-03 DIAGNOSIS — H5200 Hypermetropia, unspecified eye: Secondary | ICD-10-CM | POA: Diagnosis not present

## 2019-11-22 DIAGNOSIS — M859 Disorder of bone density and structure, unspecified: Secondary | ICD-10-CM | POA: Diagnosis not present

## 2019-11-22 DIAGNOSIS — M8589 Other specified disorders of bone density and structure, multiple sites: Secondary | ICD-10-CM | POA: Diagnosis not present

## 2019-11-22 DIAGNOSIS — E7800 Pure hypercholesterolemia, unspecified: Secondary | ICD-10-CM | POA: Diagnosis not present

## 2019-11-22 DIAGNOSIS — I10 Essential (primary) hypertension: Secondary | ICD-10-CM | POA: Diagnosis not present

## 2019-11-22 DIAGNOSIS — Z0000 Encounter for general adult medical examination without abnormal findings: Secondary | ICD-10-CM | POA: Diagnosis not present

## 2019-11-24 ENCOUNTER — Ambulatory Visit: Payer: Medicare HMO | Admitting: Obstetrics & Gynecology

## 2019-11-29 DIAGNOSIS — D500 Iron deficiency anemia secondary to blood loss (chronic): Secondary | ICD-10-CM | POA: Diagnosis not present

## 2019-11-29 DIAGNOSIS — E7800 Pure hypercholesterolemia, unspecified: Secondary | ICD-10-CM | POA: Diagnosis not present

## 2019-11-29 DIAGNOSIS — M1990 Unspecified osteoarthritis, unspecified site: Secondary | ICD-10-CM | POA: Diagnosis not present

## 2019-11-29 DIAGNOSIS — M419 Scoliosis, unspecified: Secondary | ICD-10-CM | POA: Diagnosis not present

## 2019-11-29 DIAGNOSIS — M859 Disorder of bone density and structure, unspecified: Secondary | ICD-10-CM | POA: Diagnosis not present

## 2019-11-29 DIAGNOSIS — K259 Gastric ulcer, unspecified as acute or chronic, without hemorrhage or perforation: Secondary | ICD-10-CM | POA: Diagnosis not present

## 2019-11-29 DIAGNOSIS — R82998 Other abnormal findings in urine: Secondary | ICD-10-CM | POA: Diagnosis not present

## 2019-11-29 DIAGNOSIS — H6980 Other specified disorders of Eustachian tube, unspecified ear: Secondary | ICD-10-CM | POA: Diagnosis not present

## 2019-11-29 DIAGNOSIS — Z0000 Encounter for general adult medical examination without abnormal findings: Secondary | ICD-10-CM | POA: Diagnosis not present

## 2019-11-29 DIAGNOSIS — Z853 Personal history of malignant neoplasm of breast: Secondary | ICD-10-CM | POA: Diagnosis not present

## 2019-11-29 DIAGNOSIS — N3010 Interstitial cystitis (chronic) without hematuria: Secondary | ICD-10-CM | POA: Diagnosis not present

## 2019-12-28 DIAGNOSIS — M1990 Unspecified osteoarthritis, unspecified site: Secondary | ICD-10-CM | POA: Diagnosis not present

## 2019-12-28 DIAGNOSIS — Z96649 Presence of unspecified artificial hip joint: Secondary | ICD-10-CM | POA: Diagnosis not present

## 2019-12-28 DIAGNOSIS — G8929 Other chronic pain: Secondary | ICD-10-CM | POA: Diagnosis not present

## 2019-12-28 DIAGNOSIS — Z8249 Family history of ischemic heart disease and other diseases of the circulatory system: Secondary | ICD-10-CM | POA: Diagnosis not present

## 2019-12-28 DIAGNOSIS — I10 Essential (primary) hypertension: Secondary | ICD-10-CM | POA: Diagnosis not present

## 2019-12-28 DIAGNOSIS — Z881 Allergy status to other antibiotic agents status: Secondary | ICD-10-CM | POA: Diagnosis not present

## 2019-12-28 DIAGNOSIS — Z008 Encounter for other general examination: Secondary | ICD-10-CM | POA: Diagnosis not present

## 2019-12-28 DIAGNOSIS — Z853 Personal history of malignant neoplasm of breast: Secondary | ICD-10-CM | POA: Diagnosis not present

## 2019-12-28 DIAGNOSIS — Z803 Family history of malignant neoplasm of breast: Secondary | ICD-10-CM | POA: Diagnosis not present

## 2020-01-01 ENCOUNTER — Emergency Department (HOSPITAL_COMMUNITY): Payer: Medicare HMO

## 2020-01-01 ENCOUNTER — Encounter (HOSPITAL_COMMUNITY): Payer: Self-pay | Admitting: Emergency Medicine

## 2020-01-01 ENCOUNTER — Inpatient Hospital Stay (HOSPITAL_COMMUNITY): Payer: Medicare HMO

## 2020-01-01 ENCOUNTER — Inpatient Hospital Stay (HOSPITAL_COMMUNITY)
Admission: EM | Admit: 2020-01-01 | Discharge: 2020-01-04 | DRG: 041 | Disposition: A | Discharge status: Home / Self Care | Payer: Medicare HMO | Attending: Internal Medicine | Admitting: Internal Medicine

## 2020-01-01 ENCOUNTER — Other Ambulatory Visit: Payer: Self-pay

## 2020-01-01 DIAGNOSIS — Z96641 Presence of right artificial hip joint: Secondary | ICD-10-CM | POA: Diagnosis not present

## 2020-01-01 DIAGNOSIS — E876 Hypokalemia: Secondary | ICD-10-CM | POA: Diagnosis present

## 2020-01-01 DIAGNOSIS — D751 Secondary polycythemia: Secondary | ICD-10-CM | POA: Diagnosis present

## 2020-01-01 DIAGNOSIS — R4701 Aphasia: Secondary | ICD-10-CM | POA: Diagnosis present

## 2020-01-01 DIAGNOSIS — R64 Cachexia: Secondary | ICD-10-CM | POA: Diagnosis present

## 2020-01-01 DIAGNOSIS — I63411 Cerebral infarction due to embolism of right middle cerebral artery: Principal | ICD-10-CM | POA: Diagnosis present

## 2020-01-01 DIAGNOSIS — E785 Hyperlipidemia, unspecified: Secondary | ICD-10-CM | POA: Diagnosis present

## 2020-01-01 DIAGNOSIS — I10 Essential (primary) hypertension: Secondary | ICD-10-CM | POA: Diagnosis present

## 2020-01-01 DIAGNOSIS — K219 Gastro-esophageal reflux disease without esophagitis: Secondary | ICD-10-CM | POA: Diagnosis present

## 2020-01-01 DIAGNOSIS — I63413 Cerebral infarction due to embolism of bilateral middle cerebral arteries: Secondary | ICD-10-CM | POA: Diagnosis not present

## 2020-01-01 DIAGNOSIS — I6389 Other cerebral infarction: Secondary | ICD-10-CM | POA: Diagnosis not present

## 2020-01-01 DIAGNOSIS — R29701 NIHSS score 1: Secondary | ICD-10-CM | POA: Diagnosis present

## 2020-01-01 DIAGNOSIS — Z03818 Encounter for observation for suspected exposure to other biological agents ruled out: Secondary | ICD-10-CM | POA: Diagnosis not present

## 2020-01-01 DIAGNOSIS — R4781 Slurred speech: Secondary | ICD-10-CM | POA: Diagnosis not present

## 2020-01-01 DIAGNOSIS — K254 Chronic or unspecified gastric ulcer with hemorrhage: Secondary | ICD-10-CM | POA: Diagnosis not present

## 2020-01-01 DIAGNOSIS — M8580 Other specified disorders of bone density and structure, unspecified site: Secondary | ICD-10-CM | POA: Diagnosis present

## 2020-01-01 DIAGNOSIS — Z853 Personal history of malignant neoplasm of breast: Secondary | ICD-10-CM

## 2020-01-01 DIAGNOSIS — G8194 Hemiplegia, unspecified affecting left nondominant side: Secondary | ICD-10-CM | POA: Diagnosis present

## 2020-01-01 DIAGNOSIS — Z90710 Acquired absence of both cervix and uterus: Secondary | ICD-10-CM | POA: Diagnosis not present

## 2020-01-01 DIAGNOSIS — R479 Unspecified speech disturbances: Secondary | ICD-10-CM | POA: Diagnosis present

## 2020-01-01 DIAGNOSIS — Z681 Body mass index (BMI) 19 or less, adult: Secondary | ICD-10-CM

## 2020-01-01 DIAGNOSIS — I639 Cerebral infarction, unspecified: Secondary | ICD-10-CM | POA: Diagnosis not present

## 2020-01-01 DIAGNOSIS — R002 Palpitations: Secondary | ICD-10-CM | POA: Diagnosis not present

## 2020-01-01 DIAGNOSIS — Z8616 Personal history of COVID-19: Secondary | ICD-10-CM

## 2020-01-01 DIAGNOSIS — Z79899 Other long term (current) drug therapy: Secondary | ICD-10-CM

## 2020-01-01 DIAGNOSIS — I63233 Cerebral infarction due to unspecified occlusion or stenosis of bilateral carotid arteries: Secondary | ICD-10-CM | POA: Diagnosis not present

## 2020-01-01 HISTORY — DX: COVID-19: U07.1

## 2020-01-01 LAB — DIFFERENTIAL
Abs Immature Granulocytes: 0.03 10*3/uL (ref 0.00–0.07)
Basophils Absolute: 0 10*3/uL (ref 0.0–0.1)
Basophils Relative: 0 %
Eosinophils Absolute: 0 10*3/uL (ref 0.0–0.5)
Eosinophils Relative: 1 %
Immature Granulocytes: 0 %
Lymphocytes Relative: 15 %
Lymphs Abs: 1.1 10*3/uL (ref 0.7–4.0)
Monocytes Absolute: 0.6 10*3/uL (ref 0.1–1.0)
Monocytes Relative: 9 %
Neutro Abs: 5.5 10*3/uL (ref 1.7–7.7)
Neutrophils Relative %: 75 %

## 2020-01-01 LAB — COMPREHENSIVE METABOLIC PANEL
ALT: 19 U/L (ref 0–44)
AST: 26 U/L (ref 15–41)
Albumin: 4.5 g/dL (ref 3.5–5.0)
Alkaline Phosphatase: 53 U/L (ref 38–126)
Anion gap: 11 (ref 5–15)
BUN: 23 mg/dL (ref 8–23)
CO2: 26 mmol/L (ref 22–32)
Calcium: 9.5 mg/dL (ref 8.9–10.3)
Chloride: 102 mmol/L (ref 98–111)
Creatinine, Ser: 0.76 mg/dL (ref 0.44–1.00)
GFR calc Af Amer: >60 mL/min (ref >60)
GFR calc non Af Amer: >60 mL/min (ref >60)
Glucose, Bld: 133 mg/dL — ABNORMAL HIGH (ref 70–99)
Potassium: 3.9 mmol/L (ref 3.5–5.1)
Sodium: 139 mmol/L (ref 135–145)
Total Bilirubin: 0.5 mg/dL (ref 0.3–1.2)
Total Protein: 7.3 g/dL (ref 6.5–8.1)

## 2020-01-01 LAB — RAPID URINE DRUG SCREEN, HOSP PERFORMED
Amphetamines: NOT DETECTED
Barbiturates: NOT DETECTED
Benzodiazepines: NOT DETECTED
Cocaine: NOT DETECTED
Opiates: NOT DETECTED
Tetrahydrocannabinol: NOT DETECTED

## 2020-01-01 LAB — APTT: aPTT: 29 seconds (ref 24–36)

## 2020-01-01 LAB — URINALYSIS, ROUTINE W REFLEX MICROSCOPIC
Bacteria, UA: NONE SEEN
Bilirubin Urine: NEGATIVE
Glucose, UA: NEGATIVE mg/dL
Ketones, ur: NEGATIVE mg/dL
Nitrite: NEGATIVE
Protein, ur: NEGATIVE mg/dL
Specific Gravity, Urine: 1.005 (ref 1.005–1.030)
pH: 6 (ref 5.0–8.0)

## 2020-01-01 LAB — CBC
HCT: 47 % — ABNORMAL HIGH (ref 36.0–46.0)
Hemoglobin: 15.7 g/dL — ABNORMAL HIGH (ref 12.0–15.0)
MCH: 33.3 pg (ref 26.0–34.0)
MCHC: 33.4 g/dL (ref 30.0–36.0)
MCV: 99.8 fL (ref 80.0–100.0)
Platelets: 220 10*3/uL (ref 150–400)
RBC: 4.71 MIL/uL (ref 3.87–5.11)
RDW: 13 % (ref 11.5–15.5)
WBC: 7.4 10*3/uL (ref 4.0–10.5)
nRBC: 0 % (ref 0.0–0.2)

## 2020-01-01 LAB — HEMOGLOBIN A1C
Hgb A1c MFr Bld: 5.9 % — ABNORMAL HIGH (ref 4.8–5.6)
Mean Plasma Glucose: 122.63 mg/dL

## 2020-01-01 LAB — PROTIME-INR
INR: 1 (ref 0.8–1.2)
Prothrombin Time: 12.4 seconds (ref 11.4–15.2)

## 2020-01-01 LAB — LDL CHOLESTEROL, DIRECT: Direct LDL: 111.2 mg/dL — ABNORMAL HIGH (ref 0–99)

## 2020-01-01 LAB — SEDIMENTATION RATE: Sed Rate: 10 mm/hr (ref 0–22)

## 2020-01-01 LAB — C-REACTIVE PROTEIN: CRP: 0.6 mg/dL (ref <1.0)

## 2020-01-01 LAB — ETHANOL: Alcohol, Ethyl (B): <10 mg/dL (ref <10)

## 2020-01-01 LAB — SARS CORONAVIRUS 2 BY RT PCR (HOSPITAL ORDER, PERFORMED IN ~~LOC~~ HOSPITAL LAB): SARS Coronavirus 2: NEGATIVE

## 2020-01-01 MED ORDER — ACETAMINOPHEN 650 MG RE SUPP: 650.0000 mg | RECTAL | Status: DC | PRN

## 2020-01-01 MED ORDER — LORAZEPAM 0.5 MG PO TABS: 0.5000 mg | ORAL_TABLET | Freq: Once | ORAL | Status: DC | PRN

## 2020-01-01 MED ORDER — STROKE: EARLY STAGES OF RECOVERY BOOK
Freq: Once | Status: DC
  Filled 2020-01-01: qty 1

## 2020-01-01 MED ORDER — TRIAMCINOLONE ACETONIDE 0.5 % EX CREA
TOPICAL_CREAM | Freq: Two times a day (BID) | CUTANEOUS | Status: DC
  Filled 2020-01-01: qty 15

## 2020-01-01 MED ORDER — ACETAMINOPHEN 500 MG PO TABS
500.0000 mg | ORAL_TABLET | Freq: Once | ORAL | Status: AC
  Administered 2020-01-01: 500 mg via ORAL
  Filled 2020-01-01: qty 1

## 2020-01-01 MED ORDER — IOHEXOL 350 MG/ML SOLN
40.0000 mL | Freq: Once | INTRAVENOUS | Status: AC | PRN
  Administered 2020-01-01: 40 mL via INTRAVENOUS

## 2020-01-01 MED ORDER — IOHEXOL 350 MG/ML SOLN
100.0000 mL | Freq: Once | INTRAVENOUS | Status: AC | PRN
  Administered 2020-01-01: 60 mL via INTRAVENOUS

## 2020-01-01 MED ORDER — SODIUM CHLORIDE 0.9 % IV SOLN: INTRAVENOUS | Status: DC

## 2020-01-01 MED ORDER — FLUTICASONE PROPIONATE 50 MCG/ACT NA SUSP
1.0000 | Freq: Every day | NASAL | Status: DC | PRN
  Filled 2020-01-01: qty 16

## 2020-01-01 MED ORDER — ACETAMINOPHEN 325 MG PO TABS
650.0000 mg | ORAL_TABLET | ORAL | Status: DC | PRN
  Administered 2020-01-02 – 2020-01-04 (×3): 650 mg via ORAL
  Filled 2020-01-01 (×3): qty 2

## 2020-01-01 MED ORDER — CLOPIDOGREL BISULFATE 75 MG PO TABS
75.0000 mg | ORAL_TABLET | Freq: Every day | ORAL | Status: DC
  Administered 2020-01-02 – 2020-01-04 (×3): 75 mg via ORAL
  Filled 2020-01-01 (×3): qty 1

## 2020-01-01 MED ORDER — ACETAMINOPHEN 160 MG/5ML PO SOLN: 650.0000 mg | ORAL | Status: DC | PRN

## 2020-01-01 MED ORDER — PANTOPRAZOLE SODIUM 40 MG IV SOLR
40.0000 mg | Freq: Two times a day (BID) | INTRAVENOUS | Status: DC
  Administered 2020-01-02: 40 mg via INTRAVENOUS
  Filled 2020-01-01: qty 40

## 2020-01-01 MED ORDER — ENOXAPARIN SODIUM 40 MG/0.4ML ~~LOC~~ SOLN
40.0000 mg | SUBCUTANEOUS | Status: DC
  Administered 2020-01-02 – 2020-01-03 (×2): 40 mg via SUBCUTANEOUS
  Filled 2020-01-01 (×2): qty 0.4

## 2020-01-01 NOTE — ED Notes (Signed)
Pt transferred to CT.

## 2020-01-01 NOTE — Consult Note (Signed)
[facility] TeleNeurology Consult Services  Stat Consult  Date of Service:   01/01/2020 10:56:27  Impression:       I63.9 - Cerebrovascular accident (CVA), unspecified mechanism ([place])  Comments/Sign-Out: [name] is an 79 y.o. lady with a pmh of HTN, GI hemorrhage 2/2 to bleeding gastric ulcer not related to H. Pylori, interstitial cystitis, breast cancer, and other medical issues who presents to the ER with symptoms concerning for stroke. Exam at this time shows some mild dysarthria, but otherwise appears non-focal. She does have ongoing trouble focusing her vision.  The previously noted left facial droop and LUE drift have now resolved. Her deficits overall would localize to the R MCA territory and would be consistent with ischemic stroke. There could be b/l PCA territory involvement as well, but I see no VF deficit.  Her clinical hypertension is also consistent with this. CT scan shows some subtle loss of gray/white differentiation in the R MCA territory on my review of the images. She is not an Alteplase candidate as her LKW is >4.5 hours by the time she presented to the hospital for evaluation. CTA head and neck were obtained. These scans ultimately showed no large vessel occlusion, but did show per final radiology read multi-focal areas of stenosis that are moderate to severe involving the b/l MCA branch vessels, L A2 vessel, and b/l PCA vessels. This could suggest atherosclerotic disease (less likely given the lack of calcification or extracranial atherosclerosis noted) or vasculitis. There is no headache at this time to suggest RCVS. I am avoiding DAP given her history of gastric ulcer and GI hemorrhage. Presenting labs in the ER are essentially unremarkable except for some mild hyperglycemia and slight polycythemia. At this time I do recommend she be transferred to a Neuro IR capable center for possible diagnostic cerebral angiogram and for further evaluation and  treatment. I spoke to Dr. Laverta Baltimore of the ER about my assessment and recommendations who reported understanding.  CT HEAD: Reviewed Shows some subtle loss of gray/white matter differentiation along the border of the M4/M5 ASPECT territories. Otherwise no acute intracranial process.  Metrics: [facility] Notification Time: 01/01/2020 10:55:46 Stamp Time: 01/01/2020 10:56:27 Callback Response Time: 01/01/2020 10:57:53  Our recommendations are outlined below.  Recommendations:       Recommend Euglycemia and Euthermia       Recommend cardiac telemetry       Recommend at least q4h neuro-checks       Recommend transfer to Neuro IR capable center for possible diagnostic angiogram       Recommend MRI Brain W/WO to evaluate for stroke or other intracranial pathology       Recommend TTE, A1c, and LDL       Recommend sending for ESR and CRP       Further work-up per follow up Neurology consultation at accepting center       Recommend Plavix 14m PO daily pending SLP eval, if fails can do Aspirin suppository [id number]       Recommend Atorvastatin 4075mO daily pending SLP eval, titrate to a goal LDL <70       Recommend permissive HTN up to 220/120 for now, if >20>49QPRFange in systolic BP or >15>16BWGYange in diastolic BP please ensure nursing notifies the primary team and then Neurology if change in exam or other concern by primary team       Evaluation and treatment of small right mastoid effusion and mild polycythemia per ER/primary team  Recommend tobacco cessation       Alert Neurology immediately with any neuro-worsening   Imaging Studies:       MRI Head with and Without Contrast       Echocardiogram - Transthoracic Echocardiogram  Therapies:       Physical Therapy       Occupational Therapy       Speech Therapy  Other WorkUp:       Infectious/metabolic workup per primary team  Disposition: Neurology Follow Up Recommended  Sign Out:       Discussed  with Emergency Department Provider  ----------------------------------------------------------------------------------------------------  Chief Complaint: left sided facial droop and trouble speaking  History of Present Illness: Patient is a 79 year old Female.  [name] is an 79 y.o. lady with a pmh of HTN, GI hemorrhage 2/2 to bleeding gastric ulcer not related to H. Pylori, interstitial cystitis, breast cancer, and other medical issues who presents to the ER with symptoms concerning for stroke. The patient reports that she was LKW at about 2230 last night when she went to bed. She awoke at 0300 this morning and drank some water at which point she noted that the water was dribbling down the left side of her face. She didn't think much of it, and went back to sleep. She then awoke this morning and noted at around 1030 that she was slurring her speech and did not sound right. She had not spoken to anyone before that time and the last time she is sure she was speaking normally before was last night at 2230. She also noted some trouble focusing her vision. There is no double vision or loss of vision, and she does not clearly describe it as blurry vision. Upon arrival to the ER she was noted to have LUE drift and left lower facial weakness. On my evaluation she denies any focal weakness or numbness. She denies any vertigo or headaches. She denies any recent bleeding or melena. She has never had a stroke before and does not take any blood thinning medications at home. She still feels like her speech is not normal. Note, my evaluation of the patient was delayed due to cart 1 of 2 being down for sometime and the 2nd cart was being used in another stroke code. I spoke to the ER physician over the phone as soon as I received the consult to recommend advanced imaging. The patient was not called in by the ER as a stroke code.    Past Medical History:      Hypertension      There is NO history of  Diabetes Mellitus      There is NO history of Hyperlipidemia      There is NO history of Atrial Fibrillation      There is NO history of Coronary Artery Disease      There is NO history of Stroke  Anticoagulant use:  No  Antiplatelet use: No  Radiology:  CT Head 01/01/2020 IMPRESSION: 1. No evidence for acute intracranial abnormality. 2. Minimal small vessel disease. 3. Small RIGHT mastoid effusion.  CTA Head and Neck 01/01/2020 IMPRESSION: CTA neck:  1. The origins of the innominate and left common carotid arteries are excluded from the field of view. 2. Within this limitation, the bilateral common and internal carotid arteries are patent within the neck without significant stenosis (50% or greater). Mild atherosclerotic plaque within the carotid systems as described. 3. The vertebral arteries are patent within the neck  bilaterally without significant stenosis.  CTA head:  1. No intracranial large vessel occlusion is identified. 2. Intracranial atherosclerotic disease with multifocal stenoses most notably as follows. 3. Severe focal stenosis within a superior division proximal M2 left MCA branch vessel. 4. Moderate to moderately severe focal stenosis within a superior division mid M2 left MCA branch. 5. High-grade focal stenosis within a superior division mid M2 right MCA branch vessel. 6. High-grade focal stenosis within a distal inferior division right MCA branch. 7. Moderate/severe focal stenosis within the A2 left anterior cerebral artery. 8. High-grade stenosis within the P3 right posterior cerebral artery. 9. Multifocal high-grade stenoses within P2 and more distal left PCA branch vessels.  Examination: BP(196/88), Pulse(89), Blood Glucose(133) 1A: Level of Consciousness - Alert; keenly responsive + 0 1B: Ask Month and Age - Both Questions Right + 0 1C: Blink Eyes & Squeeze Hands - Performs Both Tasks + 0 2: Test Horizontal Extraocular Movements -  Normal + 0 3: Test Visual Fields - No Visual Loss + 0 4: Test Facial Palsy (Use Grimace if Obtunded) - Normal symmetry + 0 5A: Test Left Arm Motor Drift - No Drift for 10 Seconds + 0 5B: Test Right Arm Motor Drift - No Drift for 10 Seconds + 0 6A: Test Left Leg Motor Drift - No Drift for 5 Seconds + 0 6B: Test Right Leg Motor Drift - No Drift for 5 Seconds + 0 7: Test Limb Ataxia (FNF/Heel-Shin) - No Ataxia + 0 8: Test Sensation - Normal; No sensory loss + 0 9: Test Language/Aphasia - Normal; No aphasia + 0 10: Test Dysarthria - Mild-Moderate Dysarthria: Slurring but can be understood + 1 11: Test Extinction/Inattention - No abnormality + 0  NIHSS Score: 1  Mental status is intact.  Patient is A&Ox3.   There is no receptive or expressive aphasia.  Repetition is intact.  No anomia.       EOMI without nystagmus.  VF full OU.  There is no facial weakness.   Mild dysarthria.   Tongue midline. Sensation intact in V1-V3 to light touch b/l.     Easily anti-gravity in all 4 extremities.  There is no pronator drift. Strength is 5/5 in b/l grip, biceps, triceps, hip flexors, plantarflexors, and dorsiflexors.    Sensation intact to light touch in all 4 extremities.     No dysmetria on FNF b/l.   No dysmetria on heel to shin b/l.     Patient/Family was informed the Neurology Consult would occur via TeleHealth consult by way of interactive audio and video telecommunications and consented to receiving care in this manner.  Patient is being evaluated for possible acute neurologic impairment and high probability of imminent or life-threatening deterioration. I spent total of 60 minutes providing care to this patient, including time for face to face visit via telemedicine, review of medical records, imaging studies and discussion of findings with providers, the patient and/or family.   Dr Kathee Delton   [facility] [phone]  Case [id number]

## 2020-01-01 NOTE — ED Triage Notes (Signed)
Patient c/o slurred speech, facial numbness, and blurred vision. Per patient she was fine last night at 10:30 pm and woke approx 3am to go to the bathroom. Per patient felt like face and mouth was numb. Patient reports trying to drink water but water came out of both sides of mouth. Patient states this morning had blurred vision and slurred speech in which her husband also noticed. Denies any headache or weakness on a certain side. Denies any hx of stroke/tia but does have hx of HTN.

## 2020-01-01 NOTE — H&P (Signed)
History and Physical  AIJAH LATTNER NKN:[id number] DOB: 08-10-1940 DOA: 01/01/2020  Referring physician: Nuala Alpha, PA-C, EDP PCP: Haywood Pao, MD  Outpatient Specialists:   Patient Coming From: home  Chief Complaint: slurred speech  HPI: FLOYCE BUJAK is a 79 y.o. female with a history of hypertension, history of GI bleed, history of right breast cancer status post lumpectomy and tamoxifen, osteopenia.  Patient seen for slurred speech that started this morning and seems to be resolving.  Last seen well last night.  She does report report waking up in the middle of the night and having some difficulty drinking water with some numbness around her mouth.  Her husband noticed that she did have some slurred speech when she was brought to the hospital for evaluation.  No palliating or provoking factors.  Patient's daughter was present during the interview and notes that her speech is slowed but no slurring was observed.  She denies focal neurological deficit including numbness, weakness, paresthesias.  Emergency Department Course: CT head was normal.  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  Teleneurology was consulted  Review of Systems:   Pt denies any fevers, chills, nausea, vomiting, diarrhea, constipation, abdominal pain, shortness of breath, dyspnea on exertion, orthopnea, cough, wheezing, palpitations, headache, vision changes, lightheadedness, dizziness, melena, rectal bleeding.  Review of systems are otherwise negative  Past Medical History:  Diagnosis Date  . Arthritis   . Cancer ([place]) 12/07   right breast/tx with lumpectomy and tamoxifen  . COVID-19   . GI hemorrhage   . Hypertension    under control  . Interstitial cystitis   . Osteopenia    no change   Past Surgical History:  Procedure Laterality Date  . BILATERAL SALPINGOOPHORECTOMY  age 28s  . BIOPSY  05/31/2019   Procedure: BIOPSY;  Surgeon: Daneil Dolin, MD;  Location: [facility];  Service: Endoscopy;;  . BREAST SURGERY Right    lumpectomy-multiple times  . BUNIONECTOMY WITH HAMMERTOE RECONSTRUCTION Bilateral ~2000  . CATARACT EXTRACTION Bilateral 2007  . ESOPHAGOGASTRODUODENOSCOPY N/A 05/31/2019   Dr. Gala Romney: widely patent Schatzki ring, non-bleeding cratered ulcer with adherent clot injected/thermally sealed. motled gastric mucosa with benign biopsy negative for H.pylori. plans for 3 month surveillance EGD.  [name] ESOPHAGOGASTRODUODENOSCOPY N/A 10/05/2019   Procedure: ESOPHAGOGASTRODUODENOSCOPY (EGD);  Surgeon: Daneil Dolin, MD;  Location: [facility];  Service: Endoscopy;  Laterality: N/A;  2:00pm - moved up per office  . KNEE SURGERY Bilateral as teen  . TONSILLECTOMY AND ADENOIDECTOMY  age 62  . 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 TOTAL VAGINAL HYSTERECTOMY  age 75   Social History:  reports that she has never smoked. She has never used smokeless tobacco. She reports that she does not drink alcohol or use drugs. Patient lives at home  Allergies  Allergen Reactions  . Codeine     Severe nausea.  Dizziness.  Santiago Bur [Nitrofurantoin] Other (See Comments)    Skin eruption    Family History  Problem Relation Age of Onset  . Cancer Mother        breast/mastectomy  . Heart disease Mother        poor circulation  . Deep vein thrombosis Mother   . Osteoporosis Mother   . Hypertension Father   . Colon cancer Neg Hx  Prior to Admission medications   Medication Sig Start Date End Date Taking? Authorizing Provider  acetaminophen (TYLENOL) 650 MG CR tablet Take 650-1,300 mg by mouth 2 (two) times daily as needed for pain.    [provider]  Ascorbic Acid (VITAMIN C) 1000 MG tablet Take 1,000 mg by mouth daily.    [provider]   augmented betamethasone dipropionate (DIPROLENE AF) 0.05 % cream Apply topically 2 (two) times daily. 10/06/19   Megan Salon, MD  beta carotene w/minerals (OCUVITE) tablet Take 1 tablet by mouth daily.    [provider]  Calcium-Vitamin D (CALTRATE 600 PLUS-VIT D PO) Take 1 tablet by mouth daily.     [provider]  carboxymethylcellul-glycerin (OPTIVE) 0.5-0.9 % ophthalmic solution Place 1 drop into both eyes daily as needed for dry eyes.    [provider]  Cholecalciferol (VITAMIN D3) 50 MCG (2000 UT) TABS Take 2,000 Units by mouth daily.    [provider]  docusate sodium (STOOL SOFTENER) 100 MG capsule Take 100 mg by mouth daily as needed (constipation.).     [provider]  fluticasone (FLONASE) 50 MCG/ACT nasal spray Place 1-2 sprays into both nostrils daily as needed for allergies or rhinitis.    [provider]  loratadine (CLARITIN) 10 MG tablet Take 10 mg by mouth daily.    [provider]  NON FORMULARY Take 1 capsule by mouth daily. Cysta-Q    [provider]  pantoprazole (PROTONIX) 40 MG tablet Take 1 tablet (40 mg total) by mouth 2 (two) times daily before a meal. 06/03/19 06/02/20  Emokpae, Courage, MD  polyethylene glycol (MIRALAX / GLYCOLAX) 17 g packet Take 17 g by mouth daily as needed for mild constipation. 06/03/19   Roxan Hockey, MD  Probiotic Product (ALIGN) CHEW Chew 2 tablets by mouth daily.    [provider]  quinapril (ACCUPRIL) 20 MG tablet Take 20 mg by mouth 2 (two) times daily.    [provider]    Physical Exam: BP (!) 169/76 (BP Location: Right Arm)   Pulse 74   Temp 98.6 F (37 C) (Oral)   Resp 16   Ht 5' 4"  (1.626 m)   Wt 53.5 kg   LMP  (LMP Unknown)   SpO2 97%   BMI 20.25 kg/m   . General: Elderly female. Awake and alert and oriented x3. No acute cardiopulmonary distress.  [name] HEENT: Normocephalic atraumatic.  Right and left ears normal in appearance.   Pupils equal, round, reactive to light. Extraocular muscles are intact. Sclerae anicteric and noninjected.  Moist mucosal membranes. No mucosal lesions.  . Neck: Neck supple without lymphadenopathy. No carotid bruits. No masses palpated.  . Cardiovascular: Regular rate with normal S1-S2 sounds. No murmurs, rubs, gallops auscultated. No JVD.  [name] Respiratory: Good respiratory effort with no wheezes, rales, rhonchi. Lungs clear to auscultation bilaterally.  No accessory muscle use. . Abdomen: Soft, nontender, nondistended. Active bowel sounds. No masses or hepatosplenomegaly  . Skin: No rashes, lesions, or ulcerations.  Dry, warm to touch. 2+ dorsalis pedis and radial pulses. . Musculoskeletal: No calf or leg pain. All major joints not erythematous nontender.  No upper or lower joint deformation.  Good ROM.  No contractures  . Psychiatric: Intact judgment and insight. Pleasant and cooperative. . Neurologic: No focal neurological deficits. Strength is 5/5 and symmetric in upper and lower extremities.  Cranial nerves II through XII are grossly intact.  Labs on Admission: I have personally reviewed following labs and imaging studies  CBC: Recent Labs  Lab 01/01/20 1055  WBC 7.4  NEUTROABS 5.5  HGB 15.7*  HCT 47.0*  MCV 99.8  PLT 485   Basic Metabolic Panel: Recent Labs  Lab 01/01/20 1055  NA 139  K 3.9  CL 102  CO2 26  GLUCOSE 133*  BUN 23  CREATININE 0.76  CALCIUM 9.5   GFR: Estimated Creatinine Clearance: 47.4 mL/min (by C-G formula based on SCr of 0.76 mg/dL). Liver Function Tests: Recent Labs  Lab 01/01/20 1055  AST 26  ALT 19  ALKPHOS 53  BILITOT 0.5  PROT 7.3  ALBUMIN 4.5   No results for input(s): LIPASE, AMYLASE in the last 168 hours. No results for input(s): AMMONIA in the last 168 hours. Coagulation Profile: Recent Labs  Lab 01/01/20 1055  INR 1.0   Cardiac Enzymes: No results for input(s): CKTOTAL, CKMB, CKMBINDEX, TROPONINI in the last 168  hours. BNP (last 3 results) No results for input(s): PROBNP in the last 8760 hours. HbA1C: No results for input(s): HGBA1C in the last 72 hours. CBG: No results for input(s): GLUCAP in the last 168 hours. Lipid Profile: No results for input(s): CHOL, HDL, LDLCALC, TRIG, CHOLHDL, LDLDIRECT in the last 72 hours. Thyroid Function Tests: No results for input(s): TSH, T4TOTAL, FREET4, T3FREE, THYROIDAB in the last 72 hours. Anemia Panel: No results for input(s): VITAMINB12, FOLATE, FERRITIN, TIBC, IRON, RETICCTPCT in the last 72 hours. Urine analysis:    Component Value Date/Time   COLORURINE STRAW (A) 01/01/2020 1047   APPEARANCEUR CLEAR 01/01/2020 1047   LABSPEC 1.005 01/01/2020 1047   PHURINE 6.0 01/01/2020 1047   GLUCOSEU NEGATIVE 01/01/2020 1047   HGBUR MODERATE (A) 01/01/2020 1047   BILIRUBINUR NEGATIVE 01/01/2020 1047   BILIRUBINUR n 07/16/2018 1314   KETONESUR NEGATIVE 01/01/2020 1047   PROTEINUR NEGATIVE 01/01/2020 1047   UROBILINOGEN 0.2 07/16/2018 1314   UROBILINOGEN 0.2 12/12/2013 1122   NITRITE NEGATIVE 01/01/2020 1047   LEUKOCYTESUR TRACE (A) 01/01/2020 1047   Sepsis Labs: @LABRCNTIP (procalcitonin:4,lacticidven:4) ) Recent Results (from the past 240 hour(s))  SARS Coronavirus 2 by RT PCR (hospital order, performed in [place] hospital lab) Nasopharyngeal Nasopharyngeal Swab     Status: None   Collection Time: 01/01/20  3:07 PM   Specimen: Nasopharyngeal Swab  Result Value Ref Range Status   SARS Coronavirus 2 NEGATIVE NEGATIVE Final    Comment: (NOTE) SARS-CoV-2 target nucleic acids are NOT DETECTED. The SARS-CoV-2 RNA is generally detectable in upper and lower respiratory specimens during the acute phase of infection. The lowest concentration of SARS-CoV-2 viral copies this assay can detect is 250 copies / mL. A negative result does not preclude SARS-CoV-2 infection and should not be used as the sole basis for treatment or other patient management  decisions.  A negative result may occur with improper specimen collection / handling, submission of specimen other than nasopharyngeal swab, presence of viral mutation(s) within the areas targeted by this assay, and inadequate number of viral copies (<250 copies / mL). A negative result must be combined with clinical observations, patient history, and epidemiological information. Fact Sheet for Patients:   StrictlyIdeas.no Fact Sheet for Healthcare Providers: BankingDealers.co.za This test is not yet approved or cleared  by the [place] FDA and has been authorized for detection and/or diagnosis of SARS-CoV-2 by FDA under an Emergency Use Authorization (EUA).  This EUA will remain in effect (meaning this test can be used) for the  duration of the COVID-19 declaration under Section 564(b)(1) of the Act, 21 U.S.C. section 360bbb-3(b)(1), unless the authorization is terminated or revoked sooner. Performed at [facility], [address] [place], [place] [address]      Radiological Exams on Admission: CT Angio Head W or Wo Contrast  Result Date: 01/01/2020 CLINICAL DATA:  Carotid artery stenosis; stroke, follow-up. Additional provided: Slurred speech, facial numbness, blurred vision. EXAM: CT ANGIOGRAPHY HEAD AND NECK TECHNIQUE: Multidetector CT imaging of the head and neck was performed using the standard protocol during bolus administration of intravenous contrast. Multiplanar CT image reconstructions and MIPs were obtained to evaluate the vascular anatomy. Carotid stenosis measurements (when applicable) are obtained utilizing NASCET criteria, using the distal internal carotid diameter as the denominator. CONTRAST:  18m OMNIPAQUE IOHEXOL 350 MG/ML SOLN COMPARISON:  Noncontrast head CT performed earlier the same day 01/01/2020 FINDINGS: CTA NECK FINDINGS Aortic arch: The origins of the innominate and left common carotid arteries are excluded from  the field of view. Minimal atherosclerotic plaque within the visualized aortic arch. No hemodynamically significant innominate or proximal subclavian artery stenosis within described limitations. Right carotid system: CCA and ICA patent within the neck without significant stenosis (50% or greater). Mild mixed plaque within the carotid bifurcation and proximal ICA. Left carotid system: Within described limitations, CCA and ICA patent within the neck without significant stenosis (50% or greater). Mild mixed plaque within the carotid bifurcation. Vertebral arteries: Codominant and patent within the neck without significant stenosis Skeleton: No acute bony abnormality or aggressive osseous lesion. Cervical spondylosis without high-grade bony spinal canal narrowing. Other neck: No neck mass or cervical lymphadenopathy. Upper chest: No consolidation within the imaged lung apices. Review of the MIP images confirms the above findings CTA HEAD FINDINGS Anterior circulation: The intracranial internal carotid arteries are patent. Calcified plaque within these vessels without significant stenosis. The M1 middle cerebral arteries are patent without significant stenosis. There is a high-grade focal stenosis within a superior division mid M2 right MCA branch vessel (series 13, image 14). Additionally, there is a high-grade focal stenosis within a distal inferior division right MCA branch vessel (series 13, image 12). Severe focal stenosis within a superior division proximal M2 left MCA branch (series 8, image 106) (series 10, image 137) (series 12, image 17). Moderate to moderately severe focal stenosis within a superior division mid M2 left MCA branch (series 13, image 29). The anterior cerebral arteries are patent. Moderate/severe focal stenosis within the A2 left anterior cerebral artery (series 12, image 13). No intracranial aneurysm is identified. Posterior circulation: The intracranial vertebral arteries are patent without  significant stenosis, as is the basilar artery. Fetal origin left posterior cerebral artery. The right posterior cerebral artery is patent. There is a high-grade stenosis within the P3 right PCA (series 11, image 21). Additionally, there are multifocal high-grade stenoses within distal P2 and more distal left PCA branch vessels (series 11, image 21) (series 13, image 24). The right posterior communicating artery is hypoplastic or absent. Venous sinuses: Within limitations of contrast timing, no convincing thrombus. Anatomic variants: As described Review of the MIP images confirms the above findings These results were called by telephone at the time of interpretation on 01/01/2020 at 1:48 pm to provider JOSHUA LONG , who verbally acknowledged these results. IMPRESSION: CTA neck: 1. The origins of the innominate and left common carotid arteries are excluded from the field of view. 2. Within this limitation, the bilateral common and internal carotid arteries are patent within the neck without  significant stenosis (50% or greater). Mild atherosclerotic plaque within the carotid systems as described. 3. The vertebral arteries are patent within the neck bilaterally without significant stenosis. CTA head: 1. No intracranial large vessel occlusion is identified. 2. Intracranial atherosclerotic disease with multifocal stenoses most notably as follows. 3. Severe focal stenosis within a superior division proximal M2 left MCA branch vessel. 4. Moderate to moderately severe focal stenosis within a superior division mid M2 left MCA branch. 5. High-grade focal stenosis within a superior division mid M2 right MCA branch vessel. 6. High-grade focal stenosis within a distal inferior division right MCA branch. 7. Moderate/severe focal stenosis within the A2 left anterior cerebral artery. 8. High-grade stenosis within the P3 right posterior cerebral artery. 9. Multifocal high-grade stenoses within P2 and more distal left PCA branch  vessels. Electronically Signed   By: Kellie Simmering DO   On: 01/01/2020 13:49   CT HEAD WO CONTRAST  Result Date: 01/01/2020 CLINICAL DATA:  Slurred speech. Facial numbness and blurred vision. Last seen normal last night. EXAM: CT HEAD WITHOUT CONTRAST TECHNIQUE: Contiguous axial images were obtained from the base of the skull through the vertex without intravenous contrast. COMPARISON:  03/31/2017 FINDINGS: Brain: There is minimal periventricular white matter change consistent with small vessel disease. There is no intra or extra-axial fluid collection or mass lesion. The basilar cisterns and ventricles have a normal appearance. There is no CT evidence for acute infarction or hemorrhage. Vascular: Significant atherosclerotic calcification of the internal carotid arteries. No hyperdense vessel. Skull: Normal. Negative for fracture or focal lesion. Sinuses/Orbits: Small RIGHT mastoid effusion. Other: None IMPRESSION: 1. No evidence for acute intracranial abnormality. 2. Minimal small vessel disease. 3. Small RIGHT mastoid effusion. Electronically Signed   By: Nolon Nations M.D.   On: 01/01/2020 11:48   CT Angio Neck W and/or Wo Contrast  Result Date: 01/01/2020 CLINICAL DATA:  Carotid artery stenosis; stroke, follow-up. Additional provided: Slurred speech, facial numbness, blurred vision. EXAM: CT ANGIOGRAPHY HEAD AND NECK TECHNIQUE: Multidetector CT imaging of the head and neck was performed using the standard protocol during bolus administration of intravenous contrast. Multiplanar CT image reconstructions and MIPs were obtained to evaluate the vascular anatomy. Carotid stenosis measurements (when applicable) are obtained utilizing NASCET criteria, using the distal internal carotid diameter as the denominator. CONTRAST:  27m OMNIPAQUE IOHEXOL 350 MG/ML SOLN COMPARISON:  Noncontrast head CT performed earlier the same day 01/01/2020 FINDINGS: CTA NECK FINDINGS Aortic arch: The origins of the innominate and  left common carotid arteries are excluded from the field of view. Minimal atherosclerotic plaque within the visualized aortic arch. No hemodynamically significant innominate or proximal subclavian artery stenosis within described limitations. Right carotid system: CCA and ICA patent within the neck without significant stenosis (50% or greater). Mild mixed plaque within the carotid bifurcation and proximal ICA. Left carotid system: Within described limitations, CCA and ICA patent within the neck without significant stenosis (50% or greater). Mild mixed plaque within the carotid bifurcation. Vertebral arteries: Codominant and patent within the neck without significant stenosis Skeleton: No acute bony abnormality or aggressive osseous lesion. Cervical spondylosis without high-grade bony spinal canal narrowing. Other neck: No neck mass or cervical lymphadenopathy. Upper chest: No consolidation within the imaged lung apices. Review of the MIP images confirms the above findings CTA HEAD FINDINGS Anterior circulation: The intracranial internal carotid arteries are patent. Calcified plaque within these vessels without significant stenosis. The M1 middle cerebral arteries are patent without significant stenosis. There is a high-grade focal stenosis  within a superior division mid M2 right MCA branch vessel (series 13, image 14). Additionally, there is a high-grade focal stenosis within a distal inferior division right MCA branch vessel (series 13, image 12). Severe focal stenosis within a superior division proximal M2 left MCA branch (series 8, image 106) (series 10, image 137) (series 12, image 17). Moderate to moderately severe focal stenosis within a superior division mid M2 left MCA branch (series 13, image 29). The anterior cerebral arteries are patent. Moderate/severe focal stenosis within the A2 left anterior cerebral artery (series 12, image 13). No intracranial aneurysm is identified. Posterior circulation: The  intracranial vertebral arteries are patent without significant stenosis, as is the basilar artery. Fetal origin left posterior cerebral artery. The right posterior cerebral artery is patent. There is a high-grade stenosis within the P3 right PCA (series 11, image 21). Additionally, there are multifocal high-grade stenoses within distal P2 and more distal left PCA branch vessels (series 11, image 21) (series 13, image 24). The right posterior communicating artery is hypoplastic or absent. Venous sinuses: Within limitations of contrast timing, no convincing thrombus. Anatomic variants: As described Review of the MIP images confirms the above findings These results were called by telephone at the time of interpretation on 01/01/2020 at 1:48 pm to provider JOSHUA LONG , who verbally acknowledged these results. IMPRESSION: CTA neck: 1. The origins of the innominate and left common carotid arteries are excluded from the field of view. 2. Within this limitation, the bilateral common and internal carotid arteries are patent within the neck without significant stenosis (50% or greater). Mild atherosclerotic plaque within the carotid systems as described. 3. The vertebral arteries are patent within the neck bilaterally without significant stenosis. CTA head: 1. No intracranial large vessel occlusion is identified. 2. Intracranial atherosclerotic disease with multifocal stenoses most notably as follows. 3. Severe focal stenosis within a superior division proximal M2 left MCA branch vessel. 4. Moderate to moderately severe focal stenosis within a superior division mid M2 left MCA branch. 5. High-grade focal stenosis within a superior division mid M2 right MCA branch vessel. 6. High-grade focal stenosis within a distal inferior division right MCA branch. 7. Moderate/severe focal stenosis within the A2 left anterior cerebral artery. 8. High-grade stenosis within the P3 right posterior cerebral artery. 9. Multifocal high-grade  stenoses within P2 and more distal left PCA branch vessels. Electronically Signed   By: Kellie Simmering DO   On: 01/01/2020 13:49    EKG: Independently reviewed.  Sinus rhythm with PVCs.  Right bundle branch block.  No acute ST changes  Assessment/Plan: Active Problems:   Gastric ulcer with hemorrhage   HTN (hypertension)   Slurred speech    This patient was discussed with the ED physician, including pertinent vitals, physical exam findings, labs, and imaging.  We also discussed care given by the ED provider.  1. Slurred speech a. Telemetry monitoring MRI head Echocardiogram tomorrow Hemoglobin A1c, lipid panel  PT/OT/speech therapy consult Start plavix ESR/CRP pending 2. Hypertension a. Permissive hypertension 3. History of gastric ulcer a. Hold aspirin  DVT prophylaxis: lovenox Consultants: neurology Code Status: full Family Communication: daughter present  Disposition Plan: pending   Truett Mainland, DO

## 2020-01-01 NOTE — ED Provider Notes (Signed)
[facility] Provider Note   CSN: [id number] Arrival date & time: 01/01/20  1032     History Chief Complaint  Patient presents with  . Aphasia    Doris Ford is a 79 y.o. female.\History of GI hemorrhage, hypertension, arthritis, interstitial cystitis.  Patient presents today for concern of slurred speech facial numbness and blurred vision.  Patient reports that she was feeling well last night when she went to bed around 10:30 AM.  She woke up around 3 AM to use the bathroom and drink some water, she reports at that time her lower face felt numb.  She attempted to drink some water but the water poured out of her mouth and down her chin.  She then went back to sleep, when symptoms persisted this morning she came to the ER for evaluation.  Associated symptoms intermittent blurry vision.  Denies recent illness, fall/injury, fever/chills, chest pain shortness breath, abdominal pain, numbness/weakness of extremities or any additional concerns.  HPI     Past Medical History:  Diagnosis Date  . Arthritis   . Cancer ([place]) 12/07   right breast/tx with lumpectomy and tamoxifen  . COVID-19   . GI hemorrhage   . Hypertension    under control  . Interstitial cystitis   . Osteopenia    no change    Patient Active Problem List   Diagnosis Date Noted  . Slurred speech 01/01/2020  . Gastric ulcer with hemorrhage but without obstruction 06/02/2019  . GI bleed 05/31/2019  . Gastric ulcer with hemorrhage 05/31/2019  . HTN (hypertension) 05/31/2019  . Arthritis of right hip 12/16/2013  . 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 Chronic interstitial cystitis 11/26/2011    Past Surgical History:  Procedure Laterality Date  . BILATERAL SALPINGOOPHORECTOMY  age 80s  . BIOPSY  05/31/2019   Procedure: BIOPSY;  Surgeon: Daneil Dolin, MD;  Location: [facility];  Service: Endoscopy;;  . BREAST SURGERY Right    lumpectomy-multiple times  .  BUNIONECTOMY WITH HAMMERTOE RECONSTRUCTION Bilateral ~2000  . CATARACT EXTRACTION Bilateral 2007  . ESOPHAGOGASTRODUODENOSCOPY N/A 05/31/2019   Dr. Gala Romney: widely patent Schatzki ring, non-bleeding cratered ulcer with adherent clot injected/thermally sealed. motled gastric mucosa with benign biopsy negative for H.pylori. plans for 3 month surveillance EGD.  [name] ESOPHAGOGASTRODUODENOSCOPY N/A 10/05/2019   Procedure: ESOPHAGOGASTRODUODENOSCOPY (EGD);  Surgeon: Daneil Dolin, MD;  Location: [facility];  Service: Endoscopy;  Laterality: N/A;  2:00pm - moved up per office  . KNEE SURGERY Bilateral as teen  . TONSILLECTOMY AND ADENOIDECTOMY  age 62  . 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 TOTAL VAGINAL HYSTERECTOMY  age 72     OB History    Gravida  2   Para  2   Term      Preterm      AB      Living  2     SAB      TAB      Ectopic      Multiple      Live Births              Family History  Problem Relation Age of Onset  . Cancer Mother        breast/mastectomy  . Heart disease Mother        poor  circulation  . Deep vein thrombosis Mother   . Osteoporosis Mother   . Hypertension Father   . Colon cancer Neg Hx     Social History   Tobacco Use  . Smoking status: Never Smoker  . Smokeless tobacco: Never Used  Substance Use Topics  . Alcohol use: No  . Drug use: No    Home Medications Prior to Admission medications   Medication Sig Start Date End Date Taking? Authorizing Provider  acetaminophen (TYLENOL) 650 MG CR tablet Take 650-1,300 mg by mouth 2 (two) times daily as needed for pain.    [provider]  Ascorbic Acid (VITAMIN C) 1000 MG tablet Take 1,000 mg by mouth daily.    [provider]  augmented betamethasone dipropionate (DIPROLENE AF) 0.05 % cream Apply topically 2  (two) times daily. 10/06/19   Megan Salon, MD  beta carotene w/minerals (OCUVITE) tablet Take 1 tablet by mouth daily.    [provider]  Calcium-Vitamin D (CALTRATE 600 PLUS-VIT D PO) Take 1 tablet by mouth daily.     [provider]  carboxymethylcellul-glycerin (OPTIVE) 0.5-0.9 % ophthalmic solution Place 1 drop into both eyes daily as needed for dry eyes.    [provider]  Cholecalciferol (VITAMIN D3) 50 MCG (2000 UT) TABS Take 2,000 Units by mouth daily.    [provider]  docusate sodium (STOOL SOFTENER) 100 MG capsule Take 100 mg by mouth daily as needed (constipation.).     [provider]  fluticasone (FLONASE) 50 MCG/ACT nasal spray Place 1-2 sprays into both nostrils daily as needed for allergies or rhinitis.    [provider]  loratadine (CLARITIN) 10 MG tablet Take 10 mg by mouth daily.    [provider]  NON FORMULARY Take 1 capsule by mouth daily. Cysta-Q    [provider]  pantoprazole (PROTONIX) 40 MG tablet Take 1 tablet (40 mg total) by mouth 2 (two) times daily before a meal. 06/03/19 06/02/20  Emokpae, Courage, MD  polyethylene glycol (MIRALAX / GLYCOLAX) 17 g packet Take 17 g by mouth daily as needed for mild constipation. 06/03/19   Roxan Hockey, MD  Probiotic Product (ALIGN) CHEW Chew 2 tablets by mouth daily.    [provider]  quinapril (ACCUPRIL) 20 MG tablet Take 20 mg by mouth 2 (two) times daily.    [provider]    Allergies    Codeine and Macrobid [nitrofurantoin]  Review of Systems   Review of Systems Ten systems are reviewed and are negative for acute change except as noted in the HPI  Physical Exam Updated Vital Signs BP (!) 169/76 (BP Location: Right Arm)   Pulse 74   Temp 98.6 F (37 C) (Oral)   Resp 16   Ht 5\' 4"  (1.626 m)   Wt 53.5 kg   LMP  (LMP Unknown)   SpO2 97%   BMI 20.25 kg/m   Physical Exam Constitutional:      General: She is not  in acute distress.    Appearance: Normal appearance. She is well-developed. She is not ill-appearing or diaphoretic.  HENT:     Head: Normocephalic and atraumatic.  Eyes:     General: Vision grossly intact. Gaze aligned appropriately.     Pupils: Pupils are equal, round, and reactive to light.  Neck:     Trachea: Trachea and phonation normal.  Pulmonary:     Effort: Pulmonary effort is normal. No respiratory distress.  Abdominal:  General: There is no distension.     Palpations: Abdomen is soft.     Tenderness: There is no abdominal tenderness. There is no guarding or rebound.  Musculoskeletal:        General: Normal range of motion.     Cervical back: Normal range of motion.  Skin:    General: Skin is warm and dry.  Neurological:     Mental Status: She is alert.     GCS: GCS eye subscore is 4. GCS verbal subscore is 5. GCS motor subscore is 6.     Comments: Mental Status: Alert, oriented, thought content appropriate, able to give a coherent history. Able to follow 2 step commands without difficulty.Moderately slurred speech. Cranial Nerves: II: Peripheral visual fields grossly normal, pupils equal, round, reactive to light III,IV, VI: ptosis not present, extra-ocular motions intact bilaterally V,VII: Left Facial Droop, eyebrows raise symmetric, facial light touch sensation equal VIII: hearing grossly normal to voice X: uvula elevates symmetrically XI: bilateral shoulder shrug symmetric and strong XII: midline tongue extension without fassiculations Motor: Normal tone. Equal upper extremity strength bilaterally. Equal lower extremity strength bilaterally. Sensory: Sensation intact to light touch in all extremities. Cerebellar: normal finger-to-nose with bilateral upper extremities. No pronator drift.  CV: distal pulses palpable throughout  Psychiatric:        Behavior: Behavior normal.     ED Results / Procedures / Treatments   Labs (all labs ordered are  listed, but only abnormal results are displayed) Labs Reviewed  CBC - Abnormal; Notable for the following components:      Result Value   Hemoglobin 15.7 (*)    HCT 47.0 (*)    All other components within normal limits  COMPREHENSIVE METABOLIC PANEL - Abnormal; Notable for the following components:   Glucose, Bld 133 (*)    All other components within normal limits  URINALYSIS, ROUTINE W REFLEX MICROSCOPIC - Abnormal; Notable for the following components:   Color, Urine STRAW (*)    Hgb urine dipstick MODERATE (*)    Leukocytes,Ua TRACE (*)    All other components within normal limits  SARS CORONAVIRUS 2 BY RT PCR (HOSPITAL ORDER, [facility])  ETHANOL  PROTIME-INR  APTT  DIFFERENTIAL  RAPID URINE DRUG SCREEN, HOSP PERFORMED  SEDIMENTATION RATE  C-REACTIVE PROTEIN  LDL CHOLESTEROL, DIRECT  HEMOGLOBIN A1C    EKG EKG Interpretation  Date/Time:  Sunday January 01 2020 10:51:47 EDT Ventricular Rate:  83 PR Interval:    QRS Duration: 146 QT Interval:  389 QTC Calculation: 458 R Axis:   -30 Text Interpretation: Sinus rhythm Multiform ventricular premature complexes Right bundle branch block Anteroseptal infarct, age indeterminate No STEMI Confirmed by Nanda Quinton [phone]) on 01/01/2020 11:25:28 AM   Radiology CT Angio Head W or Wo Contrast  Result Date: 01/01/2020 CLINICAL DATA:  Carotid artery stenosis; stroke, follow-up. Additional provided: Slurred speech, facial numbness, blurred vision. EXAM: CT ANGIOGRAPHY HEAD AND NECK TECHNIQUE: Multidetector CT imaging of the head and neck was performed using the standard protocol during bolus administration of intravenous contrast. Multiplanar CT image reconstructions and MIPs were obtained to evaluate the vascular anatomy. Carotid stenosis measurements (when applicable) are obtained utilizing NASCET criteria, using the distal internal carotid diameter as the denominator. CONTRAST:  21mL OMNIPAQUE IOHEXOL 350 MG/ML  SOLN COMPARISON:  Noncontrast head CT performed earlier the same day 01/01/2020 FINDINGS: CTA NECK FINDINGS Aortic arch: The origins of the innominate and left common carotid arteries are excluded from the  field of view. Minimal atherosclerotic plaque within the visualized aortic arch. No hemodynamically significant innominate or proximal subclavian artery stenosis within described limitations. Right carotid system: CCA and ICA patent within the neck without significant stenosis (50% or greater). Mild mixed plaque within the carotid bifurcation and proximal ICA. Left carotid system: Within described limitations, CCA and ICA patent within the neck without significant stenosis (50% or greater). Mild mixed plaque within the carotid bifurcation. Vertebral arteries: Codominant and patent within the neck without significant stenosis Skeleton: No acute bony abnormality or aggressive osseous lesion. Cervical spondylosis without high-grade bony spinal canal narrowing. Other neck: No neck mass or cervical lymphadenopathy. Upper chest: No consolidation within the imaged lung apices. Review of the MIP images confirms the above findings CTA HEAD FINDINGS Anterior circulation: The intracranial internal carotid arteries are patent. Calcified plaque within these vessels without significant stenosis. The M1 middle cerebral arteries are patent without significant stenosis. There is a high-grade focal stenosis within a superior division mid M2 right MCA branch vessel (series 13, image 14). Additionally, there is a high-grade focal stenosis within a distal inferior division right MCA branch vessel (series 13, image 12). Severe focal stenosis within a superior division proximal M2 left MCA branch (series 8, image 106) (series 10, image 137) (series 12, image 17). Moderate to moderately severe focal stenosis within a superior division mid M2 left MCA branch (series 13, image 29). The anterior cerebral arteries are patent. Moderate/severe  focal stenosis within the A2 left anterior cerebral artery (series 12, image 13). No intracranial aneurysm is identified. Posterior circulation: The intracranial vertebral arteries are patent without significant stenosis, as is the basilar artery. Fetal origin left posterior cerebral artery. The right posterior cerebral artery is patent. There is a high-grade stenosis within the P3 right PCA (series 11, image 21). Additionally, there are multifocal high-grade stenoses within distal P2 and more distal left PCA branch vessels (series 11, image 21) (series 13, image 24). The right posterior communicating artery is hypoplastic or absent. Venous sinuses: Within limitations of contrast timing, no convincing thrombus. Anatomic variants: As described Review of the MIP images confirms the above findings These results were called by telephone at the time of interpretation on 01/01/2020 at 1:48 pm to provider JOSHUA LONG , who verbally acknowledged these results. IMPRESSION: CTA neck: 1. The origins of the innominate and left common carotid arteries are excluded from the field of view. 2. Within this limitation, the bilateral common and internal carotid arteries are patent within the neck without significant stenosis (50% or greater). Mild atherosclerotic plaque within the carotid systems as described. 3. The vertebral arteries are patent within the neck bilaterally without significant stenosis. CTA head: 1. No intracranial large vessel occlusion is identified. 2. Intracranial atherosclerotic disease with multifocal stenoses most notably as follows. 3. Severe focal stenosis within a superior division proximal M2 left MCA branch vessel. 4. Moderate to moderately severe focal stenosis within a superior division mid M2 left MCA branch. 5. High-grade focal stenosis within a superior division mid M2 right MCA branch vessel. 6. High-grade focal stenosis within a distal inferior division right MCA branch. 7. Moderate/severe focal  stenosis within the A2 left anterior cerebral artery. 8. High-grade stenosis within the P3 right posterior cerebral artery. 9. Multifocal high-grade stenoses within P2 and more distal left PCA branch vessels. Electronically Signed   By: Kellie Simmering DO   On: 01/01/2020 13:49   CT HEAD WO CONTRAST  Result Date: 01/01/2020 CLINICAL DATA:  Slurred speech. Facial  numbness and blurred vision. Last seen normal last night. EXAM: CT HEAD WITHOUT CONTRAST TECHNIQUE: Contiguous axial images were obtained from the base of the skull through the vertex without intravenous contrast. COMPARISON:  03/31/2017 FINDINGS: Brain: There is minimal periventricular white matter change consistent with small vessel disease. There is no intra or extra-axial fluid collection or mass lesion. The basilar cisterns and ventricles have a normal appearance. There is no CT evidence for acute infarction or hemorrhage. Vascular: Significant atherosclerotic calcification of the internal carotid arteries. No hyperdense vessel. Skull: Normal. Negative for fracture or focal lesion. Sinuses/Orbits: Small RIGHT mastoid effusion. Other: None IMPRESSION: 1. No evidence for acute intracranial abnormality. 2. Minimal small vessel disease. 3. Small RIGHT mastoid effusion. Electronically Signed   By: Nolon Nations M.D.   On: 01/01/2020 11:48   CT Angio Neck W and/or Wo Contrast  Result Date: 01/01/2020 CLINICAL DATA:  Carotid artery stenosis; stroke, follow-up. Additional provided: Slurred speech, facial numbness, blurred vision. EXAM: CT ANGIOGRAPHY HEAD AND NECK TECHNIQUE: Multidetector CT imaging of the head and neck was performed using the standard protocol during bolus administration of intravenous contrast. Multiplanar CT image reconstructions and MIPs were obtained to evaluate the vascular anatomy. Carotid stenosis measurements (when applicable) are obtained utilizing NASCET criteria, using the distal internal carotid diameter as the denominator.  CONTRAST:  80mL OMNIPAQUE IOHEXOL 350 MG/ML SOLN COMPARISON:  Noncontrast head CT performed earlier the same day 01/01/2020 FINDINGS: CTA NECK FINDINGS Aortic arch: The origins of the innominate and left common carotid arteries are excluded from the field of view. Minimal atherosclerotic plaque within the visualized aortic arch. No hemodynamically significant innominate or proximal subclavian artery stenosis within described limitations. Right carotid system: CCA and ICA patent within the neck without significant stenosis (50% or greater). Mild mixed plaque within the carotid bifurcation and proximal ICA. Left carotid system: Within described limitations, CCA and ICA patent within the neck without significant stenosis (50% or greater). Mild mixed plaque within the carotid bifurcation. Vertebral arteries: Codominant and patent within the neck without significant stenosis Skeleton: No acute bony abnormality or aggressive osseous lesion. Cervical spondylosis without high-grade bony spinal canal narrowing. Other neck: No neck mass or cervical lymphadenopathy. Upper chest: No consolidation within the imaged lung apices. Review of the MIP images confirms the above findings CTA HEAD FINDINGS Anterior circulation: The intracranial internal carotid arteries are patent. Calcified plaque within these vessels without significant stenosis. The M1 middle cerebral arteries are patent without significant stenosis. There is a high-grade focal stenosis within a superior division mid M2 right MCA branch vessel (series 13, image 14). Additionally, there is a high-grade focal stenosis within a distal inferior division right MCA branch vessel (series 13, image 12). Severe focal stenosis within a superior division proximal M2 left MCA branch (series 8, image 106) (series 10, image 137) (series 12, image 17). Moderate to moderately severe focal stenosis within a superior division mid M2 left MCA branch (series 13, image 29). The anterior  cerebral arteries are patent. Moderate/severe focal stenosis within the A2 left anterior cerebral artery (series 12, image 13). No intracranial aneurysm is identified. Posterior circulation: The intracranial vertebral arteries are patent without significant stenosis, as is the basilar artery. Fetal origin left posterior cerebral artery. The right posterior cerebral artery is patent. There is a high-grade stenosis within the P3 right PCA (series 11, image 21). Additionally, there are multifocal high-grade stenoses within distal P2 and more distal left PCA branch vessels (series 11, image 21) (series 13, image  24). The right posterior communicating artery is hypoplastic or absent. Venous sinuses: Within limitations of contrast timing, no convincing thrombus. Anatomic variants: As described Review of the MIP images confirms the above findings These results were called by telephone at the time of interpretation on 01/01/2020 at 1:48 pm to provider JOSHUA LONG , who verbally acknowledged these results. IMPRESSION: CTA neck: 1. The origins of the innominate and left common carotid arteries are excluded from the field of view. 2. Within this limitation, the bilateral common and internal carotid arteries are patent within the neck without significant stenosis (50% or greater). Mild atherosclerotic plaque within the carotid systems as described. 3. The vertebral arteries are patent within the neck bilaterally without significant stenosis. CTA head: 1. No intracranial large vessel occlusion is identified. 2. Intracranial atherosclerotic disease with multifocal stenoses most notably as follows. 3. Severe focal stenosis within a superior division proximal M2 left MCA branch vessel. 4. Moderate to moderately severe focal stenosis within a superior division mid M2 left MCA branch. 5. High-grade focal stenosis within a superior division mid M2 right MCA branch vessel. 6. High-grade focal stenosis within a distal inferior division  right MCA branch. 7. Moderate/severe focal stenosis within the A2 left anterior cerebral artery. 8. High-grade stenosis within the P3 right posterior cerebral artery. 9. Multifocal high-grade stenoses within P2 and more distal left PCA branch vessels. Electronically Signed   By: Kellie Simmering DO   On: 01/01/2020 13:49    Procedures .Critical Care Performed by: Deliah Boston, PA-C Authorized by: Deliah Boston, PA-C   Critical care provider statement:    Critical care time (minutes):  40   Critical care was necessary to treat or prevent imminent or life-threatening deterioration of the following conditions:  CNS failure or compromise   Critical care was time spent personally by me on the following activities:  Discussions with consultants, evaluation of patient's response to treatment, examination of patient, ordering and performing treatments and interventions, ordering and review of laboratory studies, ordering and review of radiographic studies, pulse oximetry, re-evaluation of patient's condition, obtaining history from patient or surrogate, review of old charts and development of treatment plan with patient or surrogate   (including critical care time)  Medications Ordered in ED Medications  acetaminophen (TYLENOL) tablet 500 mg (has no administration in time range)  iohexol (OMNIPAQUE) 350 MG/ML injection 100 mL (60 mLs Intravenous Contrast Given 01/01/20 1247)    ED Course  I have reviewed the triage vital signs and the nursing notes.  Pertinent labs & imaging results that were available during my care of the patient were reviewed by me and considered in my medical decision making (see chart for details).  Clinical Course as of Jan 01 1535  Sun Jan 01, 2020  1059 Cart 2   [BM]  44 Dr. Nehemiah Settle   [BM]    Clinical Course User Index [BM] Gari Crown   MDM Rules/Calculators/A&P                     Additional History Obtained: 1. Nursing notes from this  visit. 2. Patient's daughter at bedside ------------ On my initial evaluation patient resting comfortably no acute distress.  She has a left facial droop, moderate aphasia on exam. No neglect, Visual fiend cut or weakness on exam. VAN negative. LNW 10:30 last night, patient out of window for Code Stroke.  Stroke order set initiated, CT Noncon head ordered.  Consult placed to teleneurology.  Discussed  case with Dr. Laverta Baltimore who agrees with care plan. - Patient seen and evaluated by Dr. Laverta Baltimore agrees with plan.  Patient noted to be hypertensive at 196/88, will allow permissive hypertension at this time and monitor closely and await neurology recommendations. - 10:59 AM: Spoke with teleneurology nurse manager, advises that cart 2 be brought to room. - I have ordered reviewed and interpreted the following labs: CMP shows no emergent lecture derangement, evidence of acute kidney injury, acute elevation of LFTs or gap Ethanol negative no evidence of intoxication or withdrawal APTT and PT/INR within normal limits CBC shows no leukocytosis to suggest infection and no evidence of anemia UDS negative, doubt intoxication Urinalysis shows moderate hemoglobin trace leukocytes, no evidence of infection  CT head noncontrast:  IMPRESSION:  1. No evidence for acute intracranial abnormality.  2. Minimal small vessel disease.  3. Small RIGHT mastoid effusion.  I personally reviewed patient's head CT, no evidence of hemorrhage, agree with radiologist interpretation.  EKG: Sinus rhythm Multiform ventricular premature complexes Right bundle branch block Anteroseptal infarct, age indeterminate No STEMI Confirmed by Nanda Quinton [phone]) on 01/01/2020 11:25:28 AM - Consult to teleneurology, spoke with Dr. Laverta Baltimore the recommended CT angio head neck and perfusion study. - CT Angio Head/Neck:  IMPRESSION:  CTA neck:    1. The origins of the innominate and left common carotid arteries  are excluded from the field of view.   2. Within this limitation, the bilateral common and internal carotid  arteries are patent within the neck without significant stenosis  (50% or greater). Mild atherosclerotic plaque within the carotid  systems as described.  3. The vertebral arteries are patent within the neck bilaterally  without significant stenosis.    CTA head:    1. No intracranial large vessel occlusion is identified.  2. Intracranial atherosclerotic disease with multifocal stenoses  most notably as follows.  3. Severe focal stenosis within a superior division proximal M2 left  MCA branch vessel.  4. Moderate to moderately severe focal stenosis within a superior  division mid M2 left MCA branch.  5. High-grade focal stenosis within a superior division mid M2 right  MCA branch vessel.  6. High-grade focal stenosis within a distal inferior division right  MCA branch.  7. Moderate/severe focal stenosis within the A2 left anterior  cerebral artery.  8. High-grade stenosis within the P3 right posterior cerebral  artery.  9. Multifocal high-grade stenoses within P2 and more distal left PCA  branch vessels.  - Dr. Laverta Baltimore discussed the case with teleneurology, they recommend patient be transferred to [facility] for further evaluation.  Plan of care to admit to hospitalist service prior to transfer. - 3:13 PM: Discussed case with Dr. Sheran Lawless, patient has been admitted to hospitalist service. - Patient reassessed she is resting comfortably in bed no acute distress, her daughter is at bedside.  I discussed with them the plan of care they are agreeable to transfer down to [facility].  Patient reports she has a mild headache, will give 500 mg Tylenol.   Note: Portions of this report may have been transcribed using voice recognition software. Every effort was made to ensure accuracy; however, inadvertent computerized transcription errors may still be present. Final Clinical Impression(s) / ED Diagnoses Final  diagnoses:  Cerebrovascular accident (CVA), unspecified mechanism [facility])    Rx / DC Orders ED Discharge Orders    None       Gari Crown 01/01/20 1537    Long, Wonda Olds,  MD 01/02/20 1325

## 2020-01-02 ENCOUNTER — Inpatient Hospital Stay (HOSPITAL_COMMUNITY): Payer: Medicare HMO

## 2020-01-02 DIAGNOSIS — I6389 Other cerebral infarction: Secondary | ICD-10-CM

## 2020-01-02 DIAGNOSIS — I639 Cerebral infarction, unspecified: Secondary | ICD-10-CM | POA: Diagnosis present

## 2020-01-02 LAB — CBC WITH DIFFERENTIAL/PLATELET
Abs Immature Granulocytes: 0.01 10*3/uL (ref 0.00–0.07)
Basophils Absolute: 0 10*3/uL (ref 0.0–0.1)
Basophils Relative: 0 %
Eosinophils Absolute: 0.1 10*3/uL (ref 0.0–0.5)
Eosinophils Relative: 1 %
HCT: 41.2 % (ref 36.0–46.0)
Hemoglobin: 13.7 g/dL (ref 12.0–15.0)
Immature Granulocytes: 0 %
Lymphocytes Relative: 20 %
Lymphs Abs: 1.2 10*3/uL (ref 0.7–4.0)
MCH: 33.2 pg (ref 26.0–34.0)
MCHC: 33.3 g/dL (ref 30.0–36.0)
MCV: 99.8 fL (ref 80.0–100.0)
Monocytes Absolute: 0.6 10*3/uL (ref 0.1–1.0)
Monocytes Relative: 11 %
Neutro Abs: 4 10*3/uL (ref 1.7–7.7)
Neutrophils Relative %: 68 %
Platelets: 206 10*3/uL (ref 150–400)
RBC: 4.13 MIL/uL (ref 3.87–5.11)
RDW: 12.9 % (ref 11.5–15.5)
WBC: 5.8 10*3/uL (ref 4.0–10.5)
nRBC: 0 % (ref 0.0–0.2)

## 2020-01-02 LAB — COMPREHENSIVE METABOLIC PANEL
ALT: 12 U/L (ref 0–44)
AST: 23 U/L (ref 15–41)
Albumin: 3.4 g/dL — ABNORMAL LOW (ref 3.5–5.0)
Alkaline Phosphatase: 41 U/L (ref 38–126)
Anion gap: 10 (ref 5–15)
BUN: 13 mg/dL (ref 8–23)
CO2: 22 mmol/L (ref 22–32)
Calcium: 8.7 mg/dL — ABNORMAL LOW (ref 8.9–10.3)
Chloride: 109 mmol/L (ref 98–111)
Creatinine, Ser: 0.64 mg/dL (ref 0.44–1.00)
GFR calc Af Amer: >60 mL/min (ref >60)
GFR calc non Af Amer: >60 mL/min (ref >60)
Glucose, Bld: 160 mg/dL — ABNORMAL HIGH (ref 70–99)
Potassium: 3.3 mmol/L — ABNORMAL LOW (ref 3.5–5.1)
Sodium: 141 mmol/L (ref 135–145)
Total Bilirubin: 0.6 mg/dL (ref 0.3–1.2)
Total Protein: 5.7 g/dL — ABNORMAL LOW (ref 6.5–8.1)

## 2020-01-02 LAB — HEMOGLOBIN A1C
Hgb A1c MFr Bld: 5.9 % — ABNORMAL HIGH (ref 4.8–5.6)
Mean Plasma Glucose: 122.63 mg/dL

## 2020-01-02 LAB — ECHOCARDIOGRAM COMPLETE
Height: 64 in
Weight: 1795.43 oz

## 2020-01-02 MED ORDER — PANTOPRAZOLE SODIUM 40 MG PO TBEC
40.0000 mg | DELAYED_RELEASE_TABLET | Freq: Every day | ORAL | Status: DC
  Administered 2020-01-03 – 2020-01-04 (×2): 40 mg via ORAL
  Filled 2020-01-02 (×2): qty 1

## 2020-01-02 MED ORDER — CLOPIDOGREL BISULFATE 75 MG PO TABS
300.0000 mg | ORAL_TABLET | Freq: Once | ORAL | Status: AC
  Administered 2020-01-02: 300 mg via ORAL
  Filled 2020-01-02: qty 4

## 2020-01-02 NOTE — Evaluation (Addendum)
Speech Language Pathology Evaluation Patient Details Name: Doris Ford MRN: [id number] DOB: Apr 19, 1941 Today's Date: 01/02/2020 Time: 7341-9379 SLP Time Calculation (min) (ACUTE ONLY): 20 min  Problem List:  Patient Active Problem List   Diagnosis Date Noted  . Cerebrovascular accident (CVA) ([place])   . Slurred speech 01/01/2020  . Gastric ulcer with hemorrhage but without obstruction 06/02/2019  . GI bleed 05/31/2019  . Gastric ulcer with hemorrhage 05/31/2019  . HTN (hypertension) 05/31/2019  . Arthritis of right hip 12/16/2013  . 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 Chronic interstitial cystitis 11/26/2011   Past Medical History:  Past Medical History:  Diagnosis Date  . Arthritis   . Cancer ([place]) 12/07   right breast/tx with lumpectomy and tamoxifen  . COVID-19   . GI hemorrhage   . Hypertension    under control  . Interstitial cystitis   . Osteopenia    no change   Past Surgical History:  Past Surgical History:  Procedure Laterality Date  . BILATERAL SALPINGOOPHORECTOMY  age 34s  . BIOPSY  05/31/2019   Procedure: BIOPSY;  Surgeon: Daneil Dolin, MD;  Location: [facility] ENDO SUITE;  Service: Endoscopy;;  . BREAST SURGERY Right    lumpectomy-multiple times  . BUNIONECTOMY WITH HAMMERTOE RECONSTRUCTION Bilateral ~2000  . CATARACT EXTRACTION Bilateral 2007  . ESOPHAGOGASTRODUODENOSCOPY N/A 05/31/2019   Dr. Gala Romney: widely patent Schatzki ring, non-bleeding cratered ulcer with adherent clot injected/thermally sealed. motled gastric mucosa with benign biopsy negative for H.pylori. plans for 3 month surveillance EGD.  [name] ESOPHAGOGASTRODUODENOSCOPY N/A 10/05/2019   Procedure: ESOPHAGOGASTRODUODENOSCOPY (EGD);  Surgeon: Daneil Dolin, MD;  Location: [facility] ENDO SUITE;  Service: Endoscopy;  Laterality: N/A;  2:00pm - moved up per office  . KNEE SURGERY Bilateral as teen  . TONSILLECTOMY AND ADENOIDECTOMY  age 79  . TOTAL HIP ARTHROPLASTY Right 12/16/2013   Procedure:  RIGHT TOTAL HIP ARTHROPLASTY ANTERIOR APPROACH;  Surgeon: Mcarthur Rossetti, MD;  Location: [facility] ORS;  Service: Orthopedics;  Laterality: Right;  . TOTAL VAGINAL HYSTERECTOMY  age 43   HPI:  79 y.o. female with history of osteopenia, hypertension, GI hemorrhage, COVID-19, s/p R THA (2015), and R foot 5th MT stress fx (June 2020). She presented to the ED with c/o slurred speech and facial numbness. 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    Assessment / Plan / Recommendation Clinical Impression  Pt's language appears intact; she followed 3 step and abstract commands with 100% accuracy. Pt lives with husband, independent and states she is responsible for finances. Pt denies dysnomia and she was able to express her thoughts in conversation clearly, respond to questions and name items in category (responses were abstract). Pt and daughter concerned with her speech which is intelligible but mildly slow, at times hesitant with deliberate pronunciation of syllables which alters the prosody and rhythm (slightly). She and daughter feel like her cognition is intact. SLP plans to assess this further in diagnostic treatment. SLP will provide strategies for speech however suspect this will rapidly improve. Pt admitted late last night and did not sleep much. She may need outpatient ST.    SLP Assessment  SLP Recommendation/Assessment: Patient needs continued Speech Lanaguage Pathology Services SLP Visit Diagnosis: Cognitive communication deficit (R41.841)    Follow Up Recommendations  Outpatient SLP(?)    Frequency and Duration min 1 x/week  1 week      SLP Evaluation Cognition  Overall Cognitive Status: Within Functional Limits for  tasks assessed Arousal/Alertness: Awake/alert Orientation Level: Oriented X4 Attention: Sustained Sustained Attention: Appears intact Memory: (to be assessed further) Awareness: Appears intact Problem Solving: Appears  intact(for simple) Executive Function: (assess further) Safety/Judgment: Appears intact       Comprehension  Auditory Comprehension Overall Auditory Comprehension: Appears within functional limits for tasks assessed Commands: Within Functional Limits(3 step; abstract) Visual Recognition/Discrimination Discrimination: Not tested Reading Comprehension Reading Status: Not tested    Expression Expression Primary Mode of Expression: Verbal Verbal Expression Overall Verbal Expression: Appears within functional limits for tasks assessed Initiation: No impairment(slower, halting) Repetition: (NT) Naming: No impairment(divergent naming WNL) Pragmatics: No impairment Other Verbal Expression Comments: differences in prosody, sometimes halting  Written Expression Dominant Hand: Right Written Expression: Within Functional Limits   Oral / Motor  Oral Motor/Sensory Function Overall Oral Motor/Sensory Function: Within functional limits Motor Speech Overall Motor Speech: Appears within functional limits for tasks assessed Respiration: Within functional limits Phonation: Normal Resonance: Within functional limits Articulation: Within functional limitis Intelligibility: Intelligible Motor Planning: Witnin functional limits   GO                    Houston Siren 01/02/2020, 3:36 PM Orbie Pyo Rikita Grabert M.Ed Risk analyst [phone] Office [phone]

## 2020-01-02 NOTE — Progress Notes (Signed)
°  Echocardiogram 2D Echocardiogram has been performed.  Doris Ford 01/02/2020, 4:50 PM

## 2020-01-02 NOTE — Consult Note (Addendum)
Neurology Consultation  Reason for Consult: Stroke Referring Physician: Elodia Florence., MD  CC: Stroke, slurring of speech, facial numbness  History is obtained from: Patient  HPI: Doris Ford is a 79 y.o. female with history of osteopenia, hypertension, GI hemorrhage, COVID-19.  Patient states that she was on no antiplatelets prior to hospitalization.  Due to waking up yesterday and noticing numbness in her mouth, inability to drink water without water falling out the side of her mouth and dysarthria patient came to the hospital.  MRI confirmed stroke.  Neurology was consulted for stroke.  Patient states that she went to sleep at approximately 10:30 PM on Saturday night.  She got up at approximately 3 AM to go to the bathroom.  Prior to going back to bed she got a glass of water and attempted to drink the water.  She noticed that her mouth felt numb and the water was falling out to the left side of her mouth.  She went back to bed and upon awakening that morning her husband could not understand her and she noted that her speech was very slurred.  She also did not feel right, "she felt as though she could not focus well".  It was at that time she felt as though she should go to the hospital.  Currently she still feels as though she has slow thought processing.  She feels her speech has improved and the numbness around her mouth has dissipated.  She also states she has intermittent diplopia which is horizontal when she lays back (When her bed is 65 degrees) however, at current time this is not occurring.  ED course  Relevant labs include -CTA head neck, EKG, MRI brain CT head shows-no acute intracranial abnormalities  Chart review-teleneurologist on the date of 01/02/2020.  LKW: 12/31/2019 at approximately 10:30 PM tpa given?: no, out of window Premorbid modified Rankin scale (mRS): 0  Past Medical History:  Diagnosis Date   Arthritis    Cancer ([place]) 12/07   right breast/tx  with lumpectomy and tamoxifen   COVID-19    GI hemorrhage    Hypertension    under control   Interstitial cystitis    Osteopenia    no change    Family History  Problem Relation Age of Onset   Cancer Mother        breast/mastectomy   Heart disease Mother        poor circulation   Deep vein thrombosis Mother    Osteoporosis Mother    Hypertension Father    Colon cancer Neg Hx    Social History:   reports that she has never smoked. She has never used smokeless tobacco. She reports that she does not drink alcohol or use drugs.  Medications  Current Facility-Administered Medications:     stroke: mapping our early stages of recovery book, , Does not apply, Once, Truett Mainland, DO, Stopped at 01/01/20 1948   0.9 %  sodium chloride infusion, , Intravenous, Continuous, Truett Mainland, DO, Last Rate: 75 mL/hr at 01/01/20 1946, New Bag at 01/01/20 1946   acetaminophen (TYLENOL) tablet 650 mg, 650 mg, Oral, Q4H PRN **OR** acetaminophen (TYLENOL) 160 MG/5ML solution 650 mg, 650 mg, Per Tube, Q4H PRN **OR** acetaminophen (TYLENOL) suppository 650 mg, 650 mg, Rectal, Q4H PRN, Truett Mainland, DO   clopidogrel (PLAVIX) tablet 75 mg, 75 mg, Oral, Daily, Truett Mainland, DO   enoxaparin (LOVENOX) injection 40 mg, 40 mg, Subcutaneous, Q24H, Loma Boston  J, DO   fluticasone (FLONASE) 50 MCG/ACT nasal spray 1-2 spray, 1-2 spray, Each Nare, Daily PRN, Truett Mainland, DO   LORazepam (ATIVAN) tablet 0.5 mg, 0.5 mg, Oral, Once PRN, Truett Mainland, DO   pantoprazole (PROTONIX) injection 40 mg, 40 mg, Intravenous, Q12H, Stinson, Jacob J, DO   triamcinolone cream (KENALOG) 0.5 %, , Topical, BID, Stinson, Jacob J, DO  ROS: Unable to obtain due to altered mental status.   General ROS: negative for - chills, fatigue, fever, night sweats, weight gain or weight loss Psychological ROS: Positive for -slow thought processes Ophthalmic ROS: Positive for -intermittent double  vision ENT ROS: negative for - epistaxis, nasal discharge, oral lesions, sore throat, tinnitus or vertigo Allergy and Immunology ROS: negative for - hives or itchy/watery eyes Hematological and Lymphatic ROS: negative for - bleeding problems, bruising or swollen lymph nodes Endocrine ROS: negative for - galactorrhea, hair pattern changes, polydipsia/polyuria or temperature intolerance Respiratory ROS: negative for - cough, hemoptysis, shortness of breath or wheezing Cardiovascular ROS: negative for - chest pain, dyspnea on exertion, edema or irregular heartbeat Gastrointestinal ROS: negative for - abdominal pain, diarrhea, hematemesis, nausea/vomiting or stool incontinence Genito-Urinary ROS: negative for - dysuria, hematuria, incontinence or urinary frequency/urgency Musculoskeletal ROS: Positive for -joint deformity Neurological ROS: as noted in HPI Dermatological ROS: negative for rash and skin lesion changes  Exam: Current vital signs: BP (!) 153/73    Pulse 77    Temp 98.3 F (36.8 C) (Oral)    Resp 19    Ht 5\' 4"  (1.626 m)    Wt 50.9 kg    LMP  (LMP Unknown)    SpO2 95%    BMI 19.26 kg/m  Vital signs in last 24 hours: Temp:  [98.3 F (36.8 C)-98.9 F (37.2 C)] 98.3 F (36.8 C) (06/07 0745) Pulse Rate:  [69-89] 77 (06/07 0745) Resp:  [13-22] 19 (06/07 0745) BP: (135-196)/(64-88) 153/73 (06/07 0745) SpO2:  [94 %-99 %] 95 % (06/07 0745) Weight:  [50.9 kg-53.5 kg] 50.9 kg (06/07 0125)   Constitutional: Cachectic.  Psych: Affect appropriate to situation Eyes: No scleral injection HENT: No OP obstrucion Head: Normocephalic.  Cardiovascular: Normal rate and regular rhythm.  Respiratory: Effort normal, non-labored breathing GI: Soft.  No distension. There is no tenderness.  Skin: WDI  Neuro: Mental Status: Patient is awake, alert, oriented to person, place, slow thought processes Speech-intact for naming, repeating, comprehension Patient is able to give a clear and coherent  history. Cranial Nerves: II: Visual Fields are full.  III,IV, VI: EOMI without ptosis or diploplia. Pupils equal, round and reactive to light V: Facial sensation is symmetric to temperature VII: Slight left facial droop.  VIII: hearing is intact to voice X: Palat elevates symmetrically XI: Shoulder shrug is symmetric. XII: tongue is midline without atrophy or fasciculations.  Motor: Tone is normal. Bulk is normal. 5/5 strength was present in all four extremities.  No drift Sensory: Sensation is symmetric to light touch and temperature in the arms and legs. DSS positive Deep Tendon Reflexes: 2+ and symmetric in the biceps and patellae.  Plantars: Mute Cerebellar: FNF and HKS are intact bilaterally  Labs I have reviewed labs in epic and the results pertinent to this consultation are:  CBC    Component Value Date/Time   WBC 7.4 01/01/2020 1055   RBC 4.71 01/01/2020 1055   HGB 15.7 (H) 01/01/2020 1055   HGB 14.3 12/13/2018 1530   HCT 47.0 (H) 01/01/2020 1055   HCT 39.5  12/13/2018 1530   PLT 220 01/01/2020 1055   PLT 239 12/13/2018 1530   MCV 99.8 01/01/2020 1055   MCV 96 12/13/2018 1530   MCH 33.3 01/01/2020 1055   MCHC 33.4 01/01/2020 1055   RDW 13.0 01/01/2020 1055   RDW 11.9 12/13/2018 1530   LYMPHSABS 1.1 01/01/2020 1055   LYMPHSABS 1.4 12/13/2018 1530   MONOABS 0.6 01/01/2020 1055   EOSABS 0.0 01/01/2020 1055   EOSABS 0.1 12/13/2018 1530   BASOSABS 0.0 01/01/2020 1055   BASOSABS 0.0 12/13/2018 1530    CMP     Component Value Date/Time   NA 139 01/01/2020 1055   K 3.9 01/01/2020 1055   CL 102 01/01/2020 1055   CO2 26 01/01/2020 1055   GLUCOSE 133 (H) 01/01/2020 1055   BUN 23 01/01/2020 1055   CREATININE 0.76 01/01/2020 1055   CALCIUM 9.5 01/01/2020 1055   PROT 7.3 01/01/2020 1055   ALBUMIN 4.5 01/01/2020 1055   AST 26 01/01/2020 1055   ALT 19 01/01/2020 1055   ALKPHOS 53 01/01/2020 1055   BILITOT 0.5 01/01/2020 1055   GFRNONAA >60 01/01/2020 1055    GFRAA >60 01/01/2020 1055    Lipid Panel     Component Value Date/Time   LDLDIRECT 111.2 (H) 01/01/2020 1055   A1c 5.9  Imaging I have reviewed the images obtained:  CT-scan of the brain-no evidence for acute intracranial abnormality  CTA head neck-origins of the innominate and left common carotid arteries are excluded from the field-of-view during this CTA.  Bilateral common and internal carotid arteries are patent within the neck without significant stenosis.  Vertebral arteries are patent throughout.  Severe focal stenosis within the superior division proximal M2 left MCA branch vessel.  Moderate to moderately severe focal stenosis within the superior division mid M2 left MCA branch.  High-grade focal stenosis within a superior division mid M2 right MCA branch vessel.  High-grade focal stenosis within the distal inferior division right MCA branch.  Moderate/severe focal stenosis within the A2 left anterior cerebral artery.  High-grade stenosis within the P3 right posterior cerebral artery.  Negative CT perfusion with no evidence for acute cord infarct.  MRI examination of the brain-small volume acute ischemic right MCA territory infarcts as above.  Associated minimal petechial hemorrhage without hemorrhagic transformation.  [place]y remote right cerebellar infarct.   Assessment: 79 year old female presenting to the hospital with dysarthria, oral numbness and slow thought process.  Patient found to have a small volume acute ischemic right MCA territory infarct.  Currently majority of symptoms have resolved.  She still continues to have a left facial droop.  Given she has had a GI bleed would not initiate dual antiplatelets.    Impression: -Distal right MCA territory infarct.  Recommend -Transthoracic Echo, may need Holter monitor  -Start patient on Plavix 75 mg daily with 300mg  load -Start or continue Atorvastatin 80 mg/other high intensity statin -BP goal: permissive HTN upto 220/120  mmHg -Telemetry monitoring -Frequent neuro checks -NPO until passes stroke swallow screen -PT/OT # please page stroke NP  Or  PA  Or MD from 8am -4 pm  as this patient from this time will be  followed by the stroke.   You can look them up on [email or web address]  Password [email or web address]  Etta Quill PA-C [facility] [phone]  M-F  (9:00 am- 5:00 PM)  01/02/2020, 8:50 AM   I have seen the patient reviewed the above note.  The patient is mildly dysarthric with mild left arm drift.  The MRI reveals what appears to be an embolic infarct that broke up and affecting multiple areas.  Though it could be related to her stenosis, she needs an embolic work-up as well.  Plavix monotherapy as described above.  Roland Rack, MD [facility] [phone]  If 7pm- 7am, please page neurology on call as listed in [place].

## 2020-01-02 NOTE — Progress Notes (Signed)
PROGRESS NOTE    Doris Ford  ZOX:[id number] DOB: October 24, 1940 DOA: 01/01/2020 PCP: Haywood Pao, MD   Chief Complaint  Patient presents with  . Aphasia    Brief Narrative:  Doris Ford is Doris Ford 79 y.o. female with Alon Mazor history of hypertension, history of GI bleed, history of right breast cancer status post lumpectomy and tamoxifen, osteopenia.  Patient seen for slurred speech that started this morning and seems to be resolving.  Last seen well last night.  She does report report waking up in the middle of the night and having some difficulty drinking water with some numbness around her mouth.  Her husband noticed that she did have some slurred speech when she was brought to the hospital for evaluation.  No palliating or provoking factors.  Patient's daughter was present during the interview and notes that her speech is slowed but no slurring was observed.  She denies focal neurological deficit including numbness, weakness, paresthesias.  Assessment & Plan:   Active Problems:   Gastric ulcer with hemorrhage   HTN (hypertension)   Slurred speech  Stroke  Small Volume Acute Ischemic Right MCA Territory Infarcts: MRI with small volume acute ischemic R MCA territory infarct, minimal associated petechial hemorrhage without hemorrhagic transformation or mass effect CTA head/neck bilateral common and internal carotid arteries patent without significant stenosis, vertebral arteries patent without significant stenosis (origins of innominate and L common carotid arteries are excluded from the field of view) - no intracranial LVO, intracranial atherosclerotic disease with multifocal stenoses (see report) - Neuro c/s, appreciate recs - plavix daily, atorvastatin - Lipid panel, a1c - PT/OT/SLP - echo pending - permissive hypertension - continue telemetry - normal ESR, CRP  Hypertension: permissive hypertension, home BP meds on hold  History of Gastric Ulcer: EGD 10/05/19 with Dr Gala Romney  noted healed gastric ulcer noted, currently on bid protonix - will transition to PO PPI daily  DVT prophylaxis: lovenox Code Status: full Family Communication: daughter at bedside Disposition:   Status is: Inpatient  Remains inpatient appropriate because:Inpatient level of care appropriate due to severity of illness  Dispo: The patient is from: Home              Anticipated d/c is to: pending              Anticipated d/c date is: 1 day              Patient currently is not medically stable to d/c.  Consultants:   neurology  Procedures:  none  Antimicrobials:  Anti-infectives (From admission, onward)   None     Subjective: Still has double vision Perioral numbness improved Speech seems slowed  Objective: Vitals:   01/02/20 0125 01/02/20 0130 01/02/20 0739 01/02/20 0745  BP:  (!) 181/74  (!) 153/73  Pulse:    77  Resp:  18  19  Temp:  98.9 F (37.2 C) 98.3 F (36.8 C) 98.3 F (36.8 C)  TempSrc:  Oral Oral Oral  SpO2:  99%  95%  Weight: 50.9 kg     Height: 5' 4"  (1.626 m)       Intake/Output Summary (Last 24 hours) at 01/02/2020 1324 Last data filed at 01/02/2020 0500 Gross per 24 hour  Intake 225 ml  Output --  Net 225 ml   Filed Weights   01/01/20 1045 01/02/20 0125  Weight: 53.5 kg 50.9 kg    Examination:  General exam: Appears calm and comfortable  Respiratory system: Clear to auscultation.  Respiratory effort normal. Cardiovascular system: S1 & S2 heard, RRR Gastrointestinal system: Abdomen is nondistended, soft and nontender.  Central nervous system: Alert and oriented. Mild L sided facial asymmetry noted, double vision. Extremities: Symmetric 5 x 5 power. Skin: No rashes, lesions or ulcers Psychiatry: Judgement and insight appear normal. Mood & affect appropriate.     Data Reviewed: I have personally reviewed following labs and imaging studies  CBC: Recent Labs  Lab 01/01/20 1055 01/02/20 0953  WBC 7.4 5.8  NEUTROABS 5.5 4.0  HGB 15.7*  13.7  HCT 47.0* 41.2  MCV 99.8 99.8  PLT 220 749    Basic Metabolic Panel: Recent Labs  Lab 01/01/20 1055 01/02/20 0953  NA 139 141  K 3.9 3.3*  CL 102 109  CO2 26 22  GLUCOSE 133* 160*  BUN 23 13  CREATININE 0.76 0.64  CALCIUM 9.5 8.7*    GFR: Estimated Creatinine Clearance: 45.1 mL/min (by C-G formula based on SCr of 0.64 mg/dL).  Liver Function Tests: Recent Labs  Lab 01/01/20 1055 01/02/20 0953  AST 26 23  ALT 19 12  ALKPHOS 53 41  BILITOT 0.5 0.6  PROT 7.3 5.7*  ALBUMIN 4.5 3.4*    CBG: No results for input(s): GLUCAP in the last 168 hours.   Recent Results (from the past 240 hour(s))  SARS Coronavirus 2 by RT PCR (hospital order, performed in [facility] hospital lab) Nasopharyngeal Nasopharyngeal Swab     Status: None   Collection Time: 01/01/20  3:07 PM   Specimen: Nasopharyngeal Swab  Result Value Ref Range Status   SARS Coronavirus 2 NEGATIVE NEGATIVE Final    Comment: (NOTE) SARS-CoV-2 target nucleic acids are NOT DETECTED. The SARS-CoV-2 RNA is generally detectable in upper and lower respiratory specimens during the acute phase of infection. The lowest concentration of SARS-CoV-2 viral copies this assay can detect is 250 copies / mL. [name] negative result does not preclude SARS-CoV-2 infection and should not be used as the sole basis for treatment or other patient management decisions.  [name] negative result may occur with improper specimen collection / handling, submission of specimen other than nasopharyngeal swab, presence of viral mutation(s) within the areas targeted by this assay, and inadequate number of viral copies (<250 copies / mL). [name] negative result must be combined with clinical observations, patient history, and epidemiological information. Fact Sheet for Patients:   StrictlyIdeas.no Fact Sheet for Healthcare Providers: BankingDealers.co.za This test is not yet approved or cleared  by the  [place] FDA and has been authorized for detection and/or diagnosis of SARS-CoV-2 by FDA under an Emergency Use Authorization (EUA).  This EUA will remain in effect (meaning this test can be used) for the duration of the COVID-19 declaration under Section 564(b)(1) of the Act, 21 U.S.C. section 360bbb-3(b)(1), unless the authorization is terminated or revoked sooner. Performed at [facility], [address] [place], [place] [address]          Radiology Studies: CT Angio Head W or Wo Contrast  Result Date: 01/01/2020 CLINICAL DATA:  Carotid artery stenosis; stroke, follow-up. Additional provided: Slurred speech, facial numbness, blurred vision. EXAM: CT ANGIOGRAPHY HEAD AND NECK TECHNIQUE: Multidetector CT imaging of the head and neck was performed using the standard protocol during bolus administration of intravenous contrast. Multiplanar CT image reconstructions and MIPs were obtained to evaluate the vascular anatomy. Carotid stenosis measurements (when applicable) are obtained utilizing NASCET criteria, using the distal internal carotid diameter as the denominator. CONTRAST:  33m OMNIPAQUE IOHEXOL  350 MG/ML SOLN COMPARISON:  Noncontrast head CT performed earlier the same day 01/01/2020 FINDINGS: CTA NECK FINDINGS Aortic arch: The origins of the innominate and left common carotid arteries are excluded from the field of view. Minimal atherosclerotic plaque within the visualized aortic arch. No hemodynamically significant innominate or proximal subclavian artery stenosis within described limitations. Right carotid system: CCA and ICA patent within the neck without significant stenosis (50% or greater). Mild mixed plaque within the carotid bifurcation and proximal ICA. Left carotid system: Within described limitations, CCA and ICA patent within the neck without significant stenosis (50% or greater). Mild mixed plaque within the carotid bifurcation. Vertebral arteries: Codominant and patent  within the neck without significant stenosis Skeleton: No acute bony abnormality or aggressive osseous lesion. Cervical spondylosis without high-grade bony spinal canal narrowing. Other neck: No neck mass or cervical lymphadenopathy. Upper chest: No consolidation within the imaged lung apices. Review of the MIP images confirms the above findings CTA HEAD FINDINGS Anterior circulation: The intracranial internal carotid arteries are patent. Calcified plaque within these vessels without significant stenosis. The M1 middle cerebral arteries are patent without significant stenosis. There is Zaylon Bossier high-grade focal stenosis within Meckenzie Balsley superior division mid M2 right MCA branch vessel (series 13, image 14). Additionally, there is Mckell Riecke high-grade focal stenosis within Andrianna Manalang distal inferior division right MCA branch vessel (series 13, image 12). Severe focal stenosis within Robi Mitter superior division proximal M2 left MCA branch (series 8, image 106) (series 10, image 137) (series 12, image 17). Moderate to moderately severe focal stenosis within Amaani Guilbault superior division mid M2 left MCA branch (series 13, image 29). The anterior cerebral arteries are patent. Moderate/severe focal stenosis within the A2 left anterior cerebral artery (series 12, image 13). No intracranial aneurysm is identified. Posterior circulation: The intracranial vertebral arteries are patent without significant stenosis, as is the basilar artery. Fetal origin left posterior cerebral artery. The right posterior cerebral artery is patent. There is Berania Peedin high-grade stenosis within the P3 right PCA (series 11, image 21). Additionally, there are multifocal high-grade stenoses within distal P2 and more distal left PCA branch vessels (series 11, image 21) (series 13, image 24). The right posterior communicating artery is hypoplastic or absent. Venous sinuses: Within limitations of contrast timing, no convincing thrombus. Anatomic variants: As described Review of the MIP images confirms the  above findings These results were called by telephone at the time of interpretation on 01/01/2020 at 1:48 pm to provider JOSHUA LONG , who verbally acknowledged these results. IMPRESSION: CTA neck: 1. The origins of the innominate and left common carotid arteries are excluded from the field of view. 2. Within this limitation, the bilateral common and internal carotid arteries are patent within the neck without significant stenosis (50% or greater). Mild atherosclerotic plaque within the carotid systems as described. 3. The vertebral arteries are patent within the neck bilaterally without significant stenosis. CTA head: 1. No intracranial large vessel occlusion is identified. 2. Intracranial atherosclerotic disease with multifocal stenoses most notably as follows. 3. Severe focal stenosis within Louvenia Golomb superior division proximal M2 left MCA branch vessel. 4. Moderate to moderately severe focal stenosis within Jahmir Salo superior division mid M2 left MCA branch. 5. High-grade focal stenosis within Elizette Shek superior division mid M2 right MCA branch vessel. 6. High-grade focal stenosis within Hunter Pinkard distal inferior division right MCA branch. 7. Moderate/severe focal stenosis within the A2 left anterior cerebral artery. 8. High-grade stenosis within the P3 right posterior cerebral artery. 9. Multifocal high-grade stenoses within P2 and more distal  left PCA branch vessels. Electronically Signed   By: Kellie Simmering DO   On: 01/01/2020 13:49   CT HEAD WO CONTRAST  Result Date: 01/01/2020 CLINICAL DATA:  Slurred speech. Facial numbness and blurred vision. Last seen normal last night. EXAM: CT HEAD WITHOUT CONTRAST TECHNIQUE: Contiguous axial images were obtained from the base of the skull through the vertex without intravenous contrast. COMPARISON:  03/31/2017 FINDINGS: Brain: There is minimal periventricular white matter change consistent with small vessel disease. There is no intra or extra-axial fluid collection or mass lesion. The basilar  cisterns and ventricles have Sevag Shearn normal appearance. There is no CT evidence for acute infarction or hemorrhage. Vascular: Significant atherosclerotic calcification of the internal carotid arteries. No hyperdense vessel. Skull: Normal. Negative for fracture or focal lesion. Sinuses/Orbits: Small RIGHT mastoid effusion. Other: None IMPRESSION: 1. No evidence for acute intracranial abnormality. 2. Minimal small vessel disease. 3. Small RIGHT mastoid effusion. Electronically Signed   By: Nolon Nations M.D.   On: 01/01/2020 11:48   CT Angio Neck W and/or Wo Contrast  Result Date: 01/01/2020 CLINICAL DATA:  Carotid artery stenosis; stroke, follow-up. Additional provided: Slurred speech, facial numbness, blurred vision. EXAM: CT ANGIOGRAPHY HEAD AND NECK TECHNIQUE: Multidetector CT imaging of the head and neck was performed using the standard protocol during bolus administration of intravenous contrast. Multiplanar CT image reconstructions and MIPs were obtained to evaluate the vascular anatomy. Carotid stenosis measurements (when applicable) are obtained utilizing NASCET criteria, using the distal internal carotid diameter as the denominator. CONTRAST:  27m OMNIPAQUE IOHEXOL 350 MG/ML SOLN COMPARISON:  Noncontrast head CT performed earlier the same day 01/01/2020 FINDINGS: CTA NECK FINDINGS Aortic arch: The origins of the innominate and left common carotid arteries are excluded from the field of view. Minimal atherosclerotic plaque within the visualized aortic arch. No hemodynamically significant innominate or proximal subclavian artery stenosis within described limitations. Right carotid system: CCA and ICA patent within the neck without significant stenosis (50% or greater). Mild mixed plaque within the carotid bifurcation and proximal ICA. Left carotid system: Within described limitations, CCA and ICA patent within the neck without significant stenosis (50% or greater). Mild mixed plaque within the carotid  bifurcation. Vertebral arteries: Codominant and patent within the neck without significant stenosis Skeleton: No acute bony abnormality or aggressive osseous lesion. Cervical spondylosis without high-grade bony spinal canal narrowing. Other neck: No neck mass or cervical lymphadenopathy. Upper chest: No consolidation within the imaged lung apices. Review of the MIP images confirms the above findings CTA HEAD FINDINGS Anterior circulation: The intracranial internal carotid arteries are patent. Calcified plaque within these vessels without significant stenosis. The M1 middle cerebral arteries are patent without significant stenosis. There is Ritaj Dullea high-grade focal stenosis within Yee Joss superior division mid M2 right MCA branch vessel (series 13, image 14). Additionally, there is Shawniece Oyola high-grade focal stenosis within Slevin Gunby distal inferior division right MCA branch vessel (series 13, image 12). Severe focal stenosis within Arlone Lenhardt superior division proximal M2 left MCA branch (series 8, image 106) (series 10, image 137) (series 12, image 17). Moderate to moderately severe focal stenosis within Jordynne Mccown superior division mid M2 left MCA branch (series 13, image 29). The anterior cerebral arteries are patent. Moderate/severe focal stenosis within the A2 left anterior cerebral artery (series 12, image 13). No intracranial aneurysm is identified. Posterior circulation: The intracranial vertebral arteries are patent without significant stenosis, as is the basilar artery. Fetal origin left posterior cerebral artery. The right posterior cerebral artery is patent. There is Lauralyn Shadowens  high-grade stenosis within the P3 right PCA (series 11, image 21). Additionally, there are multifocal high-grade stenoses within distal P2 and more distal left PCA branch vessels (series 11, image 21) (series 13, image 24). The right posterior communicating artery is hypoplastic or absent. Venous sinuses: Within limitations of contrast timing, no convincing thrombus. Anatomic  variants: As described Review of the MIP images confirms the above findings These results were called by telephone at the time of interpretation on 01/01/2020 at 1:48 pm to provider JOSHUA LONG , who verbally acknowledged these results. IMPRESSION: CTA neck: 1. The origins of the innominate and left common carotid arteries are excluded from the field of view. 2. Within this limitation, the bilateral common and internal carotid arteries are patent within the neck without significant stenosis (50% or greater). Mild atherosclerotic plaque within the carotid systems as described. 3. The vertebral arteries are patent within the neck bilaterally without significant stenosis. CTA head: 1. No intracranial large vessel occlusion is identified. 2. Intracranial atherosclerotic disease with multifocal stenoses most notably as follows. 3. Severe focal stenosis within Vondell Sowell superior division proximal M2 left MCA branch vessel. 4. Moderate to moderately severe focal stenosis within Dezirae Service superior division mid M2 left MCA branch. 5. High-grade focal stenosis within Daylyn Azbill superior division mid M2 right MCA branch vessel. 6. High-grade focal stenosis within Mykell Genao distal inferior division right MCA branch. 7. Moderate/severe focal stenosis within the A2 left anterior cerebral artery. 8. High-grade stenosis within the P3 right posterior cerebral artery. 9. Multifocal high-grade stenoses within P2 and more distal left PCA branch vessels. Electronically Signed   By: Kellie Simmering DO   On: 01/01/2020 13:49   MR BRAIN WO CONTRAST  Result Date: 01/02/2020 CLINICAL DATA:  Follow-up examination for acute stroke. Neuro deficit. EXAM: MRI HEAD WITHOUT CONTRAST TECHNIQUE: Multiplanar, multiecho pulse sequences of the brain and surrounding structures were obtained without intravenous contrast. COMPARISON:  Comparison made with prior CTs from 01/01/2020. FINDINGS: Brain: Cerebral volume within normal limits for age. Minimal scattered T2/FLAIR hyperintensity  within the periventricular and deep white matter both cerebral hemispheres, nonspecific, but most like related chronic small vessel ischemic disease. Tiny remote right cerebellar infarct noted. Focal area of restricted diffusion seen involving the cortical right frontal operculum and underlying insula, consistent with an acute right MCA territory infarct. Few additional scattered apparently subcentimeter cortical/subcortical infarcts seen posteriorly within the right parietal region. Largest area of ischemia measures up to 2.8 cm. Associated minimal petechial hemorrhage at the right insula without hemorrhagic transformation (series 13, image 26). No significant mass effect. No other evidence for acute or subacute ischemia. Gray-white matter differentiation otherwise maintained. No other areas of remote cortical infarction. No other evidence for acute or chronic intracranial hemorrhage. No mass lesion, midline shift or mass effect. No hydrocephalus or extra-axial fluid collection. Pituitary gland suprasellar region within normal limits. Midline structures intact. Vascular: Major intracranial vascular flow voids are maintained. Skull and upper cervical spine: Craniocervical junction within normal limits. Bone marrow signal intensity within normal limits. No scalp soft tissue abnormality. Sinuses/Orbits: Patient status post bilateral ocular lens replacement. Paranasal sinuses are largely clear. Right greater than left mastoid effusions noted, of doubtful significance. Visualized nasopharynx within normal limits. Inner ear structures grossly normal. Other: None. IMPRESSION: 1. Small volume acute ischemic right MCA territory infarcts as above. Associated minimal petechial hemorrhage without hemorrhagic transformation or mass effect. 2. No other acute intracranial abnormality. 3. Tiny remote right cerebellar infarct. 4. Underlying mild chronic microvascular ischemic disease. Electronically Signed  By: Jeannine Boga M.D.   On: 01/02/2020 03:16   CT CEREBRAL PERFUSION W CONTRAST  Result Date: 01/01/2020 CLINICAL DATA:  Follow-up examination for acute stroke, carotid artery stenosis. EXAM: CT PERFUSION BRAIN TECHNIQUE: Multiphase CT imaging of the brain was performed following IV bolus contrast injection. Subsequent parametric perfusion maps were calculated using RAPID software. CONTRAST:  59m OMNIPAQUE IOHEXOL 350 MG/ML SOLN, 48mOMNIPAQUE IOHEXOL 350 MG/ML SOLN COMPARISON:  Comparison made with prior CTA from earlier the same day. FINDINGS: CT Brain Perfusion Findings: CBF (<30%) Volume: 85m1merfusion (Tmax>6.0s) volume: 85mL97msmatch Volume: 85mL 62mECTS: Not calculated on earlier exams. Infarct Core: 0 mL Infarction Location:Negative CT perfusion for acute core infarct. Note is made of Tavio Biegel small 19 cc mild greater than 4 seconds perfusion deficit involving the right cerebral hemisphere, primarily involving the right frontal operculum. Findings suspected to be related to the previously described proximal right M2 stenoses. No other discernible perfusion deficit. IMPRESSION: 1. Negative CT perfusion with no evidence for acute core infarct. 2. Small 19 CC perfusion deficit involving the right cerebral hemisphere, primarily involving the right frontal operculum, likely related to the previously described proximal right M2 stenoses. Electronically Signed   By: BenjaJeannine Boga   On: 01/01/2020 20:34        Scheduled Meds: .  stroke: mapping our early stages of recovery book   Does not apply Once  . clopidogrel  75 mg Oral Daily  . enoxaparin (LOVENOX) injection  40 mg Subcutaneous Q24H  . pantoprazole (PROTONIX) IV  40 mg Intravenous Q12H  . triamcinolone cream   Topical BID   Continuous Infusions: . sodium chloride 75 mL/hr at 01/02/20 0925     LOS: 1 day    Time spent: over 30 min    [facility]   To contact the attending provider between 7A-7P or the  covering provider during after hours 7P-7A, please log into the web site [email or web address] and access using universal [place] password for that web site. If you do not have the password, please call the hospital operator.  01/02/2020, 1:24 PM

## 2020-01-02 NOTE — Evaluation (Addendum)
Physical Therapy Evaluation Patient Details Name: Doris Ford MRN: [id number] DOB: [birth year] Today's Date: 01/02/2020   History of Present Illness  79 y.o. female with history of osteopenia, hypertension, GI hemorrhage, COVID-19, s/p R THA (2015), and R foot 5th MT stress fx (June 2020). She presented to the ED with c/o slurred speech and facial numbness. 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    Clinical Impression  Pt admitted with above diagnosis. PTA pt lived at home with her husband, active and independent. On eval, she required min guard assist transfers and ambulation 150' without AD. Balance deficits noted. Pt able to ambulate on level surfaces without assist, but requiring assist for stability during higher-level balance activities. Pt with c/o slight headache as well as visual changes. Pt currently with functional limitations due to the deficits listed below (see PT Problem List). Pt will benefit from skilled PT to increase their independence and safety with mobility to allow discharge to the venue listed below.       Follow Up Recommendations Outpatient PT    Equipment Recommendations  None recommended by PT    Recommendations for Other Services       Precautions / Restrictions Precautions Precautions: Fall      Mobility  Bed Mobility Overal bed mobility: Modified Independent             General bed mobility comments: HOB elevated, +rail, increased time  Transfers Overall transfer level: Needs assistance Equipment used: None Transfers: Sit to/from [employer] Transfers Sit to Stand: Min guard Stand pivot transfers: Min guard       General transfer comment: min guard for safety, no physical assist  Ambulation/Gait Ambulation/Gait assistance: Min guard Gait Distance (Feet): 150 Feet Assistive device: None Gait Pattern/deviations: Step-through pattern;Decreased stride length Gait velocity:  decreased Gait velocity interpretation: 1.31 - 2.62 ft/sec, indicative of limited community ambulator General Gait Details: slow, mildly guarded gait. Min guard assist for safety. Pt ambulating in gripper socks. Reports at baseline, she always walks with shoes on (even in the house).  Stairs            Wheelchair Mobility    Modified Rankin (Stroke Patients Only) Modified Rankin (Stroke Patients Only) Pre-Morbid Rankin Score: No symptoms Modified Rankin: Moderate disability     Balance Overall balance assessment: Needs assistance Sitting-balance support: No upper extremity supported;Feet supported Sitting balance-Leahy Scale: Good     Standing balance support: No upper extremity supported;During functional activity Standing balance-Leahy Scale: Fair Standing balance comment: Ambulate without AD. Unable to accept balance challenges during gait.                             Pertinent Vitals/Pain Pain Assessment: Faces Faces Pain Scale: Hurts a little bit Pain Location: headache Pain Descriptors / Indicators: Headache Pain Intervention(s): Monitored during session;Repositioned    Home Living Family/patient expects to be discharged to:: Private residence Living Arrangements: Spouse/significant other Available Help at Discharge: Family;Available 24 hours/day Type of Home: House Home Access: Stairs to enter Entrance Stairs-Rails: Psychiatric nurse of Steps: 2 Home Layout: Able to live on main level with bedroom/bathroom Home Equipment: Walker - 2 wheels      Prior Function Level of Independence: Independent         Comments: Active. Drives. Tai Chi at senior center.     Hand Dominance        Extremity/Trunk Assessment  Upper Extremity Assessment Upper Extremity Assessment: Defer to OT evaluation    Lower Extremity Assessment Lower Extremity Assessment: Overall WFL for tasks assessed(sensation intact, strength symmetrical)     Cervical / Trunk Assessment Cervical / Trunk Assessment: Kyphotic  Communication   Communication: No difficulties  Cognition Arousal/Alertness: Awake/alert Behavior During Therapy: WFL for tasks assessed/performed Overall Cognitive Status: Within Functional Limits for tasks assessed                                        General Comments General comments (skin integrity, edema, etc.): Pt with c/o double vision.    Exercises     Assessment/Plan    PT Assessment Patient needs continued PT services  PT Problem List Decreased mobility;Decreased coordination;Pain;Decreased balance;Decreased activity tolerance       PT Treatment Interventions Therapeutic activities;Gait training;Therapeutic exercise;Patient/family education;Stair training;Balance training;Functional mobility training    PT Goals (Current goals can be found in the Care Plan section)  Acute Rehab PT Goals Patient Stated Goal: be active again PT Goal Formulation: With patient Time For Goal Achievement: 01/16/20 Potential to Achieve Goals: Good    Frequency Min 4X/week   Barriers to discharge        Co-evaluation               AM-PAC PT "6 Clicks" Mobility  Outcome Measure Help needed turning from your back to your side while in a flat bed without using bedrails?: None Help needed moving from lying on your back to sitting on the side of a flat bed without using bedrails?: None Help needed moving to and from a bed to a chair (including a wheelchair)?: A Little Help needed standing up from a chair using your arms (e.g., wheelchair or bedside chair)?: A Little Help needed to walk in hospital room?: A Little Help needed climbing 3-5 steps with a railing? : A Little 6 Click Score: 20    End of Session Equipment Utilized During Treatment: Gait belt Activity Tolerance: Patient tolerated treatment well Patient left: in bed;with call bell/phone within reach;with family/visitor  present Nurse Communication: Mobility status PT Visit Diagnosis: Unsteadiness on feet (R26.81)    Time: 6759-1638 PT Time Calculation (min) (ACUTE ONLY): 24 min   Charges:   PT Evaluation $PT Eval Moderate Complexity: 1 Mod PT Treatments $Gait Training: 8-22 mins        Lorrin Goodell, PT  Office # [phone] Pager [phone]   Lorriane Shire 01/02/2020, 12:24 PM

## 2020-01-02 NOTE — Evaluation (Signed)
Occupational Therapy Evaluation Patient Details Name: Doris Ford MRN: [id number] DOB: 26-Feb-1941 Today's Date: 01/02/2020    History of Present Illness 79 y.o. female with history of osteopenia, hypertension, GI hemorrhage, COVID-19, s/p R THA (2015), and R foot 5th MT stress fx (June 2020). She presented to the ED with c/o slurred speech and facial numbness. 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   Clinical Impression   PTA, pt was living at home with her husband, pt reports she was independent with ADL/IADL and functional mobiltiy. Pt reports she was still driving and was doing tai chi classes. Pt currently requires minguard for functional mobility. She reports diplopia with images on top of each other in right visual field and images side by side in left visual field, resolves with one eye closed. Due to decline in current level of function, pt would benefit from acute OT to address established goals to facilitate safe D/C to venue listed below. At this time, recommend outpatient follow-up. Will continue to follow acutely.     Follow Up Recommendations  Outpatient OT;Supervision - Intermittent(with mobility)    Equipment Recommendations  None recommended by OT    Recommendations for Other Services       Precautions / Restrictions Precautions Precautions: Fall Restrictions Weight Bearing Restrictions: No      Mobility Bed Mobility Overal bed mobility: Modified Independent             General bed mobility comments: HOB elevated, +rail, increased time  Transfers Overall transfer level: Needs assistance Equipment used: None Transfers: Sit to/from [employer] Transfers Sit to Stand: Min guard Stand pivot transfers: Min guard       General transfer comment: min guard for safety, no physical assist    Balance Overall balance assessment: Needs assistance Sitting-balance support: No upper extremity  supported;Feet supported Sitting balance-Leahy Scale: Good     Standing balance support: No upper extremity supported;During functional activity Standing balance-Leahy Scale: Fair Standing balance comment: Ambulate without AD. Unable to accept balance challenges during gait.                           ADL either performed or assessed with clinical judgement   ADL Overall ADL's : Needs assistance/impaired Eating/Feeding: Set up;Sitting   Grooming: Min guard;Standing Grooming Details (indicate cue type and reason): washed hands while standing at sink Upper Body Bathing: Min guard;Sitting   Lower Body Bathing: Min guard;Sit to/from stand Lower Body Bathing Details (indicate cue type and reason): minguard for safety Upper Body Dressing : Set up;Sitting   Lower Body Dressing: Min guard;Sit to/from stand   Toilet Transfer: Programmer, systems Details (indicate cue type and reason): minguard for stability and safety no AD  Toileting- Clothing Manipulation and Hygiene: Sit to/from stand;Min guard Toileting - Clothing Manipulation Details (indicate cue type and reason): pt urinated on BSC     Functional mobility during ADLs: Min guard General ADL Comments: pt demonstrated preference for at least single UE support      Vision Baseline Vision/History: Wears glasses Wears Glasses: Reading only Patient Visual Report: Diplopia Vision Assessment?: Yes Eye Alignment: Within Functional Limits Ocular Range of Motion: Within Functional Limits Alignment/Gaze Preference: Within Defined Limits Tracking/Visual Pursuits: Able to track stimulus in all quads without difficulty Visual Fields: No apparent deficits Diplopia Assessment: Present in far gaze;Objects split side to side;Objects split on top of one another;Disappears with one eye closed;Present  all the time/all directions     Perception     Praxis      Pertinent Vitals/Pain Pain Assessment: 0-10 Pain  Score: 2  Faces Pain Scale: No hurt Pain Location: headache Pain Descriptors / Indicators: Headache Pain Intervention(s): Monitored during session;Limited activity within patient's tolerance     Hand Dominance Right   Extremity/Trunk Assessment Upper Extremity Assessment Upper Extremity Assessment: Overall WFL for tasks assessed(sensation in-tact;strength symmetrical)   Lower Extremity Assessment Lower Extremity Assessment: Overall WFL for tasks assessed   Cervical / Trunk Assessment Cervical / Trunk Assessment: Kyphotic   Communication Communication Communication: No difficulties   Cognition Arousal/Alertness: Awake/alert Behavior During Therapy: WFL for tasks assessed/performed Overall Cognitive Status: Within Functional Limits for tasks assessed                                     General Comments  BP 160/66 ;educated pt and daughter on BE FAST accronym with written handout    Exercises     Shoulder Instructions      Home Living Family/patient expects to be discharged to:: Private residence Living Arrangements: Spouse/significant other Available Help at Discharge: Family;Available 24 hours/day Type of Home: House Home Access: Stairs to enter [employer] of Steps: 2 Entrance Stairs-Rails: Right;Left Home Layout: Able to live on main level with bedroom/bathroom     Bathroom Shower/Tub: Occupational psychologist: Handicapped height     Home Equipment: Environmental consultant - 2 wheels      Lives With: Spouse    Prior Functioning/Environment Level of Independence: Independent        Comments: Active. Drives. Tai Chi at senior center.        OT Problem List: Decreased activity tolerance;Impaired balance (sitting and/or standing);Decreased safety awareness;Decreased knowledge of use of DME or AE      OT Treatment/Interventions: Self-care/ADL training;Therapeutic exercise;DME and/or AE instruction;Therapeutic  activities;Visual/perceptual remediation/compensation;Patient/family education;Balance training    OT Goals(Current goals can be found in the care plan section) Acute Rehab OT Goals Patient Stated Goal: be active again OT Goal Formulation: With patient Time For Goal Achievement: 01/16/20 Potential to Achieve Goals: Good ADL Goals Pt Will Perform Grooming: with modified independence;standing Pt Will Perform Lower Body Dressing: with modified independence;sit to/from stand Pt Will Transfer to Toilet: with modified independence;ambulating Additional ADL Goal #1: Pt will demonstrate independence with verbalizing BE FAST signs and symptoms of a stroke.  OT Frequency: Min 2X/week   Barriers to D/C:            Co-evaluation              AM-PAC OT "6 Clicks" Daily Activity     Outcome Measure Help from another person eating meals?: A Little Help from another person taking care of personal grooming?: A Little Help from another person toileting, which includes using toliet, bedpan, or urinal?: A Little Help from another person bathing (including washing, rinsing, drying)?: A Little Help from another person to put on and taking off regular upper body clothing?: A Little Help from another person to put on and taking off regular lower body clothing?: A Little 6 Click Score: 18   End of Session Equipment Utilized During Treatment: Gait belt Nurse Communication: Mobility status  Activity Tolerance: Patient tolerated treatment well Patient left: in bed;with call bell/phone within reach;with bed alarm set;with family/visitor present  OT Visit Diagnosis: Unsteadiness on feet (R26.81);Other abnormalities of gait  and mobility (R26.89);Pain;Low vision, both eyes (H54.2) Pain - part of body: (headache)                Time: 0488-8916 OT Time Calculation (min): 41 min Charges:  OT General Charges $OT Visit: 1 Visit OT Evaluation $OT Eval Moderate Complexity: 1 Mod OT Treatments $Self  Care/Home Management : 23-37 mins  Helene Kelp OTR/L Acute Rehabilitation Services Office: [phone]   Wyn Forster 01/02/2020, 3:50 PM

## 2020-01-03 LAB — LIPID PANEL
Cholesterol: 194 mg/dL (ref 0–200)
HDL: 62 mg/dL (ref >40)
LDL Cholesterol: 115 mg/dL — ABNORMAL HIGH (ref 0–99)
Total CHOL/HDL Ratio: 3.1 RATIO
Triglycerides: 87 mg/dL (ref <150)
VLDL: 17 mg/dL (ref 0–40)

## 2020-01-03 LAB — COMPREHENSIVE METABOLIC PANEL
ALT: 23 U/L (ref 0–44)
AST: 33 U/L (ref 15–41)
Albumin: 3.1 g/dL — ABNORMAL LOW (ref 3.5–5.0)
Alkaline Phosphatase: 40 U/L (ref 38–126)
Anion gap: 7 (ref 5–15)
BUN: 9 mg/dL (ref 8–23)
CO2: 24 mmol/L (ref 22–32)
Calcium: 8.6 mg/dL — ABNORMAL LOW (ref 8.9–10.3)
Chloride: 110 mmol/L (ref 98–111)
Creatinine, Ser: 0.57 mg/dL (ref 0.44–1.00)
GFR calc Af Amer: >60 mL/min (ref >60)
GFR calc non Af Amer: >60 mL/min (ref >60)
Glucose, Bld: 103 mg/dL — ABNORMAL HIGH (ref 70–99)
Potassium: 3.5 mmol/L (ref 3.5–5.1)
Sodium: 141 mmol/L (ref 135–145)
Total Bilirubin: 0.2 mg/dL — ABNORMAL LOW (ref 0.3–1.2)
Total Protein: 5.5 g/dL — ABNORMAL LOW (ref 6.5–8.1)

## 2020-01-03 LAB — CBC WITH DIFFERENTIAL/PLATELET
Abs Immature Granulocytes: 0.02 10*3/uL (ref 0.00–0.07)
Basophils Absolute: 0 10*3/uL (ref 0.0–0.1)
Basophils Relative: 1 %
Eosinophils Absolute: 0.1 10*3/uL (ref 0.0–0.5)
Eosinophils Relative: 2 %
HCT: 40.3 % (ref 36.0–46.0)
Hemoglobin: 13.6 g/dL (ref 12.0–15.0)
Immature Granulocytes: 0 %
Lymphocytes Relative: 23 %
Lymphs Abs: 1.3 10*3/uL (ref 0.7–4.0)
MCH: 33.6 pg (ref 26.0–34.0)
MCHC: 33.7 g/dL (ref 30.0–36.0)
MCV: 99.5 fL (ref 80.0–100.0)
Monocytes Absolute: 0.7 10*3/uL (ref 0.1–1.0)
Monocytes Relative: 13 %
Neutro Abs: 3.4 10*3/uL (ref 1.7–7.7)
Neutrophils Relative %: 61 %
Platelets: 200 10*3/uL (ref 150–400)
RBC: 4.05 MIL/uL (ref 3.87–5.11)
RDW: 13 % (ref 11.5–15.5)
WBC: 5.6 10*3/uL (ref 4.0–10.5)
nRBC: 0 % (ref 0.0–0.2)

## 2020-01-03 LAB — PHOSPHORUS: Phosphorus: 3.1 mg/dL (ref 2.5–4.6)

## 2020-01-03 LAB — MAGNESIUM: Magnesium: 1.8 mg/dL (ref 1.7–2.4)

## 2020-01-03 MED ORDER — ASPIRIN EC 81 MG PO TBEC
81.0000 mg | DELAYED_RELEASE_TABLET | Freq: Every day | ORAL | Status: DC
  Administered 2020-01-03 – 2020-01-04 (×2): 81 mg via ORAL
  Filled 2020-01-03 (×2): qty 1

## 2020-01-03 MED ORDER — ATORVASTATIN CALCIUM 40 MG PO TABS
40.0000 mg | ORAL_TABLET | Freq: Every day | ORAL | Status: DC
  Administered 2020-01-03 – 2020-01-04 (×2): 40 mg via ORAL
  Filled 2020-01-03 (×2): qty 1

## 2020-01-03 NOTE — Progress Notes (Addendum)
PROGRESS NOTE    Doris Ford  IOX:[id number] DOB: 1940-08-18 DOA: 01/01/2020 PCP: Haywood Pao, MD   Chief Complaint  Patient presents with  . Aphasia    Brief Narrative:  Doris Ford is Doris Ford 79 y.o. female with Doris Ford history of hypertension, history of GI bleed, history of right breast cancer status post lumpectomy and tamoxifen, osteopenia.  Patient seen for slurred speech that started this morning and seems to be resolving.  Last seen well last night.  She does report report waking up in the middle of the night and having some difficulty drinking water with some numbness around her mouth.  Her husband noticed that she did have some slurred speech when she was brought to the hospital for evaluation.  No palliating or provoking factors.  Patient's daughter was present during the interview and notes that her speech is slowed but no slurring was observed.  She denies focal neurological deficit including numbness, weakness, paresthesias.  She was admitted for an acute stroke with perioral numbness, slurred speech, and L sided facial droop.  Neurology recommending DAPTx3 weeks followed by plavix alone.  Cardiology has been c/s for loop recorder, after this, likely will be able to discharge with outpatient PT/OT/SLP.   Assessment & Plan:   Active Problems:   Gastric ulcer with hemorrhage   HTN (hypertension)   Slurred speech   Cerebrovascular accident (CVA) ([place])  Stroke  Small Volume Acute Ischemic Right MCA Territory Infarcts: MRI with small volume acute ischemic R MCA territory infarct, minimal associated petechial hemorrhage without hemorrhagic transformation or mass effect CTA head/neck bilateral common and internal carotid arteries patent without significant stenosis, vertebral arteries patent without significant stenosis (origins of innominate and L common carotid arteries are excluded from the field of view) - no intracranial LVO, intracranial atherosclerotic disease with  multifocal stenoses (see report) - Neuro c/s, appreciate recs - DAPT x 3 weeks then plavix alone.  Continue atorvastatin.  Final consult note pending. - Lipid panel (LDL 115), a1c (5.9) - PT/OT/SLP -> recommending outpatient therapy - echo -> EF 60-65%, mild LVH, grade 1 diastolic dysfunction (see report) - permissive hypertension - continue telemetry - normal ESR, CRP  Hypertension: permissive hypertension, home BP meds on hold  History of Gastric Ulcer: EGD 10/05/19 with Dr Gala Romney noted healed gastric ulcer noted continue PPI daily  DVT prophylaxis: lovenox Code Status: full Family Communication: daughter over phone 6/8 Disposition:   Status is: Inpatient  Remains inpatient appropriate because:Inpatient level of care appropriate due to severity of illness  Dispo: The patient is from: Home              Anticipated d/c is to: pending              Anticipated d/c date is: 1 day              Patient currently is not medically stable to d/c.  Consultants:   neurology  Procedures:  Echo IMPRESSIONS    1. Left ventricular ejection fraction, by estimation, is 60 to 65%. The  left ventricle has normal function. The left ventricle has no regional  wall motion abnormalities. There is mild left ventricular hypertrophy.  Left ventricular diastolic parameters  are consistent with Grade I diastolic dysfunction (impaired relaxation).  2. Right ventricular systolic function is normal. The right ventricular  size is normal. There is normal pulmonary artery systolic pressure.  3. The mitral valve is grossly normal. Trivial mitral valve  regurgitation.  4. The  aortic valve is tricuspid. Aortic valve regurgitation is not  visualized.  5. The inferior vena cava is normal in size with greater than 50%  respiratory variability, suggesting right atrial pressure of 3 mmHg.  6. Trivial to small pericardial effusion. No tamponade features.   Antimicrobials:  Anti-infectives (From  admission, onward)   None     Subjective: Sx improved, double vision persistent Objective:  Vitals:   01/02/20 2237 01/03/20 0346 01/03/20 0744 01/03/20 1607  BP: (!) 169/66  (!) 118/96 (!) 160/56  Pulse:    70  Resp: 18  20 (!) 21  Temp: 98.4 F (36.9 C) 98.3 F (36.8 C) 98.5 F (36.9 C) 98.8 F (37.1 C)  TempSrc: Oral Oral Oral Oral  SpO2: 96%  96% 91%  Weight:      Height:        Intake/Output Summary (Last 24 hours) at 01/03/2020 1646 Last data filed at 01/03/2020 0600 Gross per 24 hour  Intake 825 ml  Output 775 ml  Net 50 ml   Filed Weights   01/01/20 1045 01/02/20 0125  Weight: 53.5 kg 50.9 kg    Examination:  General: No acute distress. Cardiovascular: Heart sounds show Doris Ford regular rate, and rhythm.  Lungs: Clear to auscultation bilaterally  Abdomen: Soft, nontender, nondistended Neurological: Alert and oriented 3. L sided facial asymmetry, 4/5 strength to upper and lower extremities. Skin: Warm and dry. No rashes or lesions. Extremities: No clubbing or cyanosis. No edema.   Data Reviewed: I have personally reviewed following labs and imaging studies  CBC: Recent Labs  Lab 01/01/20 1055 01/02/20 0953 01/03/20 0506  WBC 7.4 5.8 5.6  NEUTROABS 5.5 4.0 3.4  HGB 15.7* 13.7 13.6  HCT 47.0* 41.2 40.3  MCV 99.8 99.8 99.5  PLT 220 206 808    Basic Metabolic Panel: Recent Labs  Lab 01/01/20 1055 01/02/20 0953 01/03/20 0506  NA 139 141 141  K 3.9 3.3* 3.5  CL 102 109 110  CO2 _0 GLUCOSE 133* 160* 103*  BUN _1 CREATININE 0.76 0.64 0.57  CALCIUM 9.5 8.7* 8.6*  MG  --   --  1.8  PHOS  --   --  3.1    GFR: Estimated Creatinine Clearance: 45.1 mL/min (by C-G formula based on SCr of 0.57 mg/dL).  Liver Function Tests: Recent Labs  Lab 01/01/20 1055 01/02/20 0953 01/03/20 0506  AST 26 23 33  ALT _2 ALKPHOS 53 41 40  BILITOT 0.5 0.6 0.2*  PROT 7.3 5.7* 5.5*  ALBUMIN 4.5 3.4* 3.1*    CBG: No results for input(s):  GLUCAP in the last 168 hours.   Recent Results (from the past 240 hour(s))  SARS Coronavirus 2 by RT PCR (hospital order, performed in [facility] hospital lab) Nasopharyngeal Nasopharyngeal Swab     Status: None   Collection Time: 01/01/20  3:07 PM   Specimen: Nasopharyngeal Swab  Result Value Ref Range Status   SARS Coronavirus 2 NEGATIVE NEGATIVE Final    Comment: (NOTE) SARS-CoV-2 target nucleic acids are NOT DETECTED. The SARS-CoV-2 RNA is generally detectable in upper and lower respiratory specimens during the acute phase of infection. The lowest concentration of SARS-CoV-2 viral copies this assay can detect is 250 copies / mL. Doris Ford negative result does not preclude SARS-CoV-2 infection and should not be used as the sole basis for treatment or other patient management decisions.  Doris Ford negative result may occur with improper specimen collection /  handling, submission of specimen other than nasopharyngeal swab, presence of viral mutation(s) within the areas targeted by this assay, and inadequate number of viral copies (<250 copies / mL). Doris Ford negative result must be combined with clinical observations, patient history, and epidemiological information. Fact Sheet for Patients:   StrictlyIdeas.no Fact Sheet for Healthcare Providers: BankingDealers.co.za This test is not yet approved or cleared  by the [place] FDA and has been authorized for detection and/or diagnosis of SARS-CoV-2 by FDA under an Emergency Use Authorization (EUA).  This EUA will remain in effect (meaning this test can be used) for the duration of the COVID-19 declaration under Section 564(b)(1) of the Act, 21 U.S.C. section 360bbb-3(b)(1), unless the authorization is terminated or revoked sooner. Performed at [facility], [address] [place], [place] [address]          Radiology Studies: MR BRAIN WO CONTRAST  Result Date: 01/02/2020 CLINICAL DATA:  Follow-up  examination for acute stroke. Neuro deficit. EXAM: MRI HEAD WITHOUT CONTRAST TECHNIQUE: Multiplanar, multiecho pulse sequences of the brain and surrounding structures were obtained without intravenous contrast. COMPARISON:  Comparison made with prior CTs from 01/01/2020. FINDINGS: Brain: Cerebral volume within normal limits for age. Minimal scattered T2/FLAIR hyperintensity within the periventricular and deep white matter both cerebral hemispheres, nonspecific, but most like related chronic small vessel ischemic disease. Tiny remote right cerebellar infarct noted. Focal area of restricted diffusion seen involving the cortical right frontal operculum and underlying insula, consistent with an acute right MCA territory infarct. Few additional scattered apparently subcentimeter cortical/subcortical infarcts seen posteriorly within the right parietal region. Largest area of ischemia measures up to 2.8 cm. Associated minimal petechial hemorrhage at the right insula without hemorrhagic transformation (series 13, image 26). No significant mass effect. No other evidence for acute or subacute ischemia. Gray-white matter differentiation otherwise maintained. No other areas of remote cortical infarction. No other evidence for acute or chronic intracranial hemorrhage. No mass lesion, midline shift or mass effect. No hydrocephalus or extra-axial fluid collection. Pituitary gland suprasellar region within normal limits. Midline structures intact. Vascular: Major intracranial vascular flow voids are maintained. Skull and upper cervical spine: Craniocervical junction within normal limits. Bone marrow signal intensity within normal limits. No scalp soft tissue abnormality. Sinuses/Orbits: Patient status post bilateral ocular lens replacement. Paranasal sinuses are largely clear. Right greater than left mastoid effusions noted, of doubtful significance. Visualized nasopharynx within normal limits. Inner ear structures grossly normal.  Other: None. IMPRESSION: 1. Small volume acute ischemic right MCA territory infarcts as above. Associated minimal petechial hemorrhage without hemorrhagic transformation or mass effect. 2. No other acute intracranial abnormality. 3. Tiny remote right cerebellar infarct. 4. Underlying mild chronic microvascular ischemic disease. Electronically Signed   By: Jeannine Boga M.D.   On: 01/02/2020 03:16   CT CEREBRAL PERFUSION W CONTRAST  Result Date: 01/01/2020 CLINICAL DATA:  Follow-up examination for acute stroke, carotid artery stenosis. EXAM: CT PERFUSION BRAIN TECHNIQUE: Multiphase CT imaging of the brain was performed following IV bolus contrast injection. Subsequent parametric perfusion maps were calculated using RAPID software. CONTRAST:  14m OMNIPAQUE IOHEXOL 350 MG/ML SOLN, 469mOMNIPAQUE IOHEXOL 350 MG/ML SOLN COMPARISON:  Comparison made with prior CTA from earlier the same day. FINDINGS: CT Brain Perfusion Findings: CBF (<30%) Volume: 48m82merfusion (Tmax>6.0s) volume: 48mL57msmatch Volume: 48mL 45mECTS: Not calculated on earlier exams. Infarct Core: 0 mL Infarction Location:Negative CT perfusion for acute core infarct. Note is made of Doris Ford small 19 cc mild greater than 4 seconds perfusion deficit involving  the right cerebral hemisphere, primarily involving the right frontal operculum. Findings suspected to be related to the previously described proximal right M2 stenoses. No other discernible perfusion deficit. IMPRESSION: 1. Negative CT perfusion with no evidence for acute core infarct. 2. Small 19 CC perfusion deficit involving the right cerebral hemisphere, primarily involving the right frontal operculum, likely related to the previously described proximal right M2 stenoses. Electronically Signed   By: Jeannine Boga M.D.   On: 01/01/2020 20:34   ECHOCARDIOGRAM COMPLETE  Result Date: 01/02/2020    ECHOCARDIOGRAM REPORT   Patient Name:   Doris Ford Date of Exam: 01/02/2020 Medical Rec #:   [id number]        Height:       64.0 in Accession #:    [id number]       Weight:       112.2 lb Date of Birth:  1941/04/03        BSA:          1.530 m Patient Age:    57 years         BP:           153/73 mmHg Patient Gender: F                HR:           77 bpm. Exam Location:  Inpatient Procedure: 2D Echo, Cardiac Doppler and Color Doppler Indications:    Stroke  History:        Patient has no prior history of Echocardiogram examinations.                 Risk Factors:Hypertension.  Sonographer:    Clayton Lefort RDCS (AE) Referring Phys: [place]  1. Left ventricular ejection fraction, by estimation, is 60 to 65%. The left ventricle has normal function. The left ventricle has no regional wall motion abnormalities. There is mild left ventricular hypertrophy. Left ventricular diastolic parameters are consistent with Grade I diastolic dysfunction (impaired relaxation).  2. Right ventricular systolic function is normal. The right ventricular size is normal. There is normal pulmonary artery systolic pressure.  3. The mitral valve is grossly normal. Trivial mitral valve regurgitation.  4. The aortic valve is tricuspid. Aortic valve regurgitation is not visualized.  5. The inferior vena cava is normal in size with greater than 50% respiratory variability, suggesting right atrial pressure of 3 mmHg.  6. Trivial to small pericardial effusion. No tamponade features. FINDINGS  Left Ventricle: Left ventricular ejection fraction, by estimation, is 60 to 65%. The left ventricle has normal function. The left ventricle has no regional wall motion abnormalities. The left ventricular internal cavity size was normal in size. There is  mild left ventricular hypertrophy. Left ventricular diastolic parameters are consistent with Grade I diastolic dysfunction (impaired relaxation). Indeterminate filling pressures. Right Ventricle: The right ventricular size is normal. No increase in right ventricular wall thickness.  Right ventricular systolic function is normal. There is normal pulmonary artery systolic pressure. The tricuspid regurgitant velocity is 1.98 m/s, and  with an assumed right atrial pressure of 3 mmHg, the estimated right ventricular systolic pressure is 06.2 mmHg. Left Atrium: Left atrial size was normal in size. Right Atrium: Right atrial size was normal in size. Pericardium: Doris Ford trivial to small pericardial effusion is present. The pericardial effusion is circumferential. There is no evidence of cardiac tamponade. Mitral Valve: The mitral valve is grossly normal. Trivial mitral valve regurgitation. MV peak gradient, 2.2 mmHg.  The mean mitral valve gradient is 1.0 mmHg. Tricuspid Valve: The tricuspid valve is grossly normal. Tricuspid valve regurgitation is trivial. Aortic Valve: The aortic valve is tricuspid. Aortic valve regurgitation is not visualized. Aortic valve mean gradient measures 3.0 mmHg. Aortic valve peak gradient measures 6.9 mmHg. Aortic valve area, by VTI measures 1.93 cm. Pulmonic Valve: The pulmonic valve was grossly normal. Pulmonic valve regurgitation is not visualized. Aorta: The aortic root and ascending aorta are structurally normal, with no evidence of dilitation. Venous: The inferior vena cava is normal in size with greater than 50% respiratory variability, suggesting right atrial pressure of 3 mmHg. IAS/Shunts: No atrial level shunt detected by color flow Doppler.  LEFT VENTRICLE PLAX 2D LVIDd:         4.18 cm  Diastology LVIDs:         2.69 cm  LV e' lateral:   5.12 cm/s LV PW:         1.24 cm  LV E/e' lateral: 7.7 LV IVS:        1.10 cm  LV e' medial:    4.05 cm/s LVOT diam:     1.90 cm  LV E/e' medial:  9.7 LV SV:         41 LV SV Index:   27 LVOT Area:     2.84 cm  RIGHT VENTRICLE            IVC RV Basal diam:  2.88 cm    IVC diam: 0.81 cm RV S prime:     8.05 cm/s TAPSE (M-mode): 2.1 cm LEFT ATRIUM           Index       RIGHT ATRIUM           Index LA diam:      2.10 cm 1.37 cm/m  RA  Area:     15.90 cm LA Vol (A2C): 29.0 ml 18.95 ml/m RA Volume:   45.50 ml  29.73 ml/m LA Vol (A4C): 30.5 ml 19.93 ml/m  AORTIC VALVE AV Area (Vmax):    1.61 cm AV Area (Vmean):   1.59 cm AV Area (VTI):     1.93 cm AV Vmax:           131.00 cm/s AV Vmean:          78.100 cm/s AV VTI:            0.215 m AV Peak Grad:      6.9 mmHg AV Mean Grad:      3.0 mmHg LVOT Vmax:         74.60 cm/s LVOT Vmean:        43.900 cm/s LVOT VTI:          0.146 m LVOT/AV VTI ratio: 0.68  AORTA Ao Root diam: 3.20 cm Ao Asc diam:  3.30 cm MITRAL VALVE               TRICUSPID VALVE MV Area (PHT): 1.89 cm    TR Peak grad:   15.7 mmHg MV Peak grad:  2.2 mmHg    TR Vmax:        198.00 cm/s MV Mean grad:  1.0 mmHg MV Vmax:       0.74 m/s    SHUNTS MV Vmean:      39.3 cm/s   Systemic VTI:  0.15 m MV Decel Time: 401 msec    Systemic Diam: 1.90 cm MV E velocity: 39.20 cm/s MV Doris Ford velocity: 59.40 cm/s  MV E/Doris Ford ratio:  0.66 Lyman Bishop MD Electronically signed by Lyman Bishop MD Signature Date/Time: 01/02/2020/5:06:28 PM    Final         Scheduled Meds: .  stroke: mapping our early stages of recovery book   Does not apply Once  . aspirin EC  81 mg Oral Daily  . atorvastatin  40 mg Oral Daily  . clopidogrel  75 mg Oral Daily  . enoxaparin (LOVENOX) injection  40 mg Subcutaneous Q24H  . pantoprazole  40 mg Oral Daily  . triamcinolone cream   Topical BID   Continuous Infusions: . sodium chloride 75 mL/hr at 01/02/20 2322     LOS: 2 days    Time spent: over 30 min    Doris Helper, MD [facility]   To contact the attending provider between 7A-7P or the covering provider during after hours 7P-7A, please log into the web site [email or web address] and access using universal [place] password for that web site. If you do not have the password, please call the hospital operator.  01/03/2020, 4:46 PM

## 2020-01-03 NOTE — Progress Notes (Signed)
Physical Therapy Treatment Patient Details Name: Doris Ford MRN: [id number] DOB: [birth year] Today's Date: 01/03/2020    History of Present Illness 79 y.o. female with history of osteopenia, hypertension, GI hemorrhage, COVID-19, s/p R THA (2015), and R foot 5th MT stress fx (June 2020). She presented to the ED with c/o slurred speech and facial numbness. 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    PT Comments    Pt with good tolerance for PT session this day, ambulating hallway distance and proficiently navigating 3 steps with supervision level of assist only. Pt scored 13/24 on DGI dynamic balance assessment, demonstrating pt is at increased risk of falls. PT continuing to recommend OPPT to address balance and strength deficits, will continue to follow acutely.    Follow Up Recommendations  Outpatient PT     Equipment Recommendations  None recommended by PT    Recommendations for Other Services       Precautions / Restrictions Precautions Precautions: Fall Restrictions Weight Bearing Restrictions: No    Mobility  Bed Mobility Overal bed mobility: Modified Independent             General bed mobility comments: increased time to perform, able to come to long sit without UE support  Transfers Overall transfer level: Needs assistance Equipment used: None Transfers: Sit to/from Stand;Stand Pivot Transfers Sit to Stand: Supervision Stand pivot transfers: Supervision       General transfer comment: for safety, no physical assist to perform stand or pivot to [facility]. sit to stand x2, from EOB and from [facility].  Ambulation/Gait Ambulation/Gait assistance: Supervision;Min guard Gait Distance (Feet): 210 Feet Assistive device: None Gait Pattern/deviations: Step-through pattern;Decreased stride length Gait velocity: slightly decr   General Gait Details: Supervision for safety during normal gait, min guard for dynamic balance  challenges (see balance section). Ambulation with shoes donned.   Stairs             Wheelchair Mobility    Modified Rankin (Stroke Patients Only) Modified Rankin (Stroke Patients Only) Pre-Morbid Rankin Score: No symptoms Modified Rankin: Moderate disability     Balance Overall balance assessment: Needs assistance Sitting-balance support: No upper extremity supported;Feet supported Sitting balance-Leahy Scale: Good     Standing balance support: No upper extremity supported;During functional activity Standing balance-Leahy Scale: Good Standing balance comment: accepts min challenge to dynamic standing balance                 Standardized Balance Assessment Standardized Balance Assessment : Dynamic Gait Index   Dynamic Gait Index Level Surface: Mild Impairment Change in Gait Speed: Mild Impairment Gait with Horizontal Head Turns: Moderate Impairment Gait with Vertical Head Turns: Mild Impairment Gait and Pivot Turn: Mild Impairment Step Over Obstacle: Moderate Impairment Step Around Obstacles: Mild Impairment Steps: Moderate Impairment Total Score: 13      Cognition Arousal/Alertness: Awake/alert Behavior During Therapy: WFL for tasks assessed/performed Overall Cognitive Status: Within Functional Limits for tasks assessed                                        Exercises      General Comments        Pertinent Vitals/Pain Pain Assessment: No/denies pain    Home Living                      Prior Function  PT Goals (current goals can now be found in the care plan section) Acute Rehab PT Goals Patient Stated Goal: be active again PT Goal Formulation: With patient Time For Goal Achievement: 01/16/20 Potential to Achieve Goals: Good Progress towards PT goals: Progressing toward goals    Frequency    Min 4X/week      PT Plan Current plan remains appropriate    Co-evaluation               AM-PAC PT "6 Clicks" Mobility   Outcome Measure  Help needed turning from your back to your side while in a flat bed without using bedrails?: None Help needed moving from lying on your back to sitting on the side of a flat bed without using bedrails?: None Help needed moving to and from a bed to a chair (including a wheelchair)?: None Help needed standing up from a chair using your arms (e.g., wheelchair or bedside chair)?: None Help needed to walk in hospital room?: A Little Help needed climbing 3-5 steps with a railing? : A Little 6 Click Score: 22    End of Session Equipment Utilized During Treatment: Gait belt Activity Tolerance: Patient tolerated treatment well Patient left: in bed;with call bell/phone within reach;with family/visitor present;with bed alarm set Nurse Communication: Mobility status PT Visit Diagnosis: Unsteadiness on feet (R26.81)     Time: 6606-0045 PT Time Calculation (min) (ACUTE ONLY): 18 min  Charges:  $Gait Training: 8-22 mins                     Valeri Sula E, PT Acute Rehabilitation Services Pager [phone]  Office [phone]   Crystal Scarberry D Elonda Husky 01/03/2020, 12:42 PM

## 2020-01-03 NOTE — Consult Note (Addendum)
ELECTROPHYSIOLOGY CONSULT NOTE  Patient ID: SENNIE BORDEN MRN: [id number], DOB/AGE: Oct 07, 1940   Admit date: 01/01/2020 Date of Consult: 01/04/20   Primary Physician: Haywood Pao, MD Primary Cardiologist: No primary care provider on file.  Primary Electrophysiologist: New to None  Reason for Consultation: Cryptogenic stroke; recommendations regarding Implantable Loop Recorder Insurance: [employer] Medicare  History of Present Illness EP has been asked to evaluate Doris Ford for placement of an implantable loop recorder to monitor for atrial fibrillation by Dr  Florene Glen (Confirmed with neuro that Loop was requested) .  The patient was admitted on 01/01/2020 with numbness in her mouth, inability to hold water in her mouth, and dysarthria.  They first developed symptoms while at home.  Imaging demonstrated small volume acute ischemic right MCA territory infracts.  They have undergone workup for stroke including echocardiogram and CTA Head and Neck.  The patient has been monitored on telemetry which has demonstrated sinus rhythm with no arrhythmias.  Inpatient stroke work-up Doris Ford not require a TEE per Neurology.   LE Dopplers performed and pending   Echocardiogram this admission demonstrated LVEF 60-65%.  Lab work is reviewed.  Prior to admission, the patient denies chest pain, shortness of breath, dizziness, or syncope. She does have intermittent palpitations, with no clear aggravating factors. She describes them as a singular "hard" beat, most often felt in the quiet of the evenings. They are recovering from their stroke with plans to return home  at discharge.  Past Medical History:  Diagnosis Date   Arthritis    Cancer ([place]) 12/07   right breast/tx with lumpectomy and tamoxifen   COVID-19    GI hemorrhage    Hypertension    under control   Interstitial cystitis    Osteopenia    no change     Surgical History:  Past Surgical History:  Procedure Laterality Date    BILATERAL SALPINGOOPHORECTOMY  age 71s   BIOPSY  05/31/2019   Procedure: BIOPSY;  Surgeon: Daneil Dolin, MD;  Location: [facility];  Service: Endoscopy;;   BREAST SURGERY Right    lumpectomy-multiple times   BUNIONECTOMY WITH HAMMERTOE RECONSTRUCTION Bilateral ~2000   CATARACT EXTRACTION Bilateral 2007   ESOPHAGOGASTRODUODENOSCOPY N/A 05/31/2019   Dr. Gala Romney: widely patent Schatzki ring, non-bleeding cratered ulcer with adherent clot injected/thermally sealed. motled gastric mucosa with benign biopsy negative for H.pylori. plans for 3 month surveillance EGD.   ESOPHAGOGASTRODUODENOSCOPY N/A 10/05/2019   Procedure: ESOPHAGOGASTRODUODENOSCOPY (EGD);  Surgeon: Daneil Dolin, MD;  Location: [facility];  Service: Endoscopy;  Laterality: N/A;  2:00pm - moved up per office   KNEE SURGERY Bilateral as teen   TONSILLECTOMY AND ADENOIDECTOMY  age 6   TOTAL HIP ARTHROPLASTY Right 12/16/2013   Procedure: RIGHT TOTAL HIP ARTHROPLASTY ANTERIOR APPROACH;  Surgeon: Mcarthur Rossetti, MD;  Location: [facility];  Service: Orthopedics;  Laterality: Right;   TOTAL VAGINAL HYSTERECTOMY  age 51     Medications Prior to Admission  Medication Sig Dispense Refill Last Dose   acetaminophen (TYLENOL) 650 MG CR tablet Take 650-1,300 mg by mouth 2 (two) times daily as needed for pain.   Past Week at Unknown time   albuterol (VENTOLIN HFA) 108 (90 Base) MCG/ACT inhaler Inhale 1-2 puffs into the lungs every 4 (four) hours as needed for wheezing or shortness of breath.    unknown   Ascorbic Acid (VITAMIN C) 1000 MG tablet Take 1,000 mg by mouth daily.   12/31/2019 at Unknown time  beta carotene w/minerals (OCUVITE) tablet Take 1 tablet by mouth daily.   12/31/2019 at Unknown time   Calcium-Vitamin D (CALTRATE 600 PLUS-VIT D PO) Take 1 tablet by mouth daily.    12/31/2019 at Unknown time   carboxymethylcellul-glycerin (OPTIVE) 0.5-0.9 % ophthalmic solution Place 1 drop into both eyes daily as needed for dry eyes.   Past  Month at Unknown time   Cholecalciferol (VITAMIN D3) 50 MCG (2000 UT) TABS Take 2,000 Units by mouth daily.   12/31/2019 at Unknown time   docusate sodium (STOOL SOFTENER) 100 MG capsule Take 100 mg by mouth daily as needed (constipation.).    Past Month at Unknown time   fluticasone (FLONASE) 50 MCG/ACT nasal spray Place 1-2 sprays into both nostrils daily as needed for allergies or rhinitis.   unknown   loratadine (CLARITIN) 10 MG tablet Take 10 mg by mouth daily.   12/31/2019 at Unknown time   NON FORMULARY Take 1 capsule by mouth daily. Cysta-Q   01/01/2020 at Unknown time   polyethylene glycol (MIRALAX / GLYCOLAX) 17 g packet Take 17 g by mouth daily as needed for mild constipation. 14 each 0 unknown   quinapril (ACCUPRIL) 20 MG tablet Take 20 mg by mouth 2 (two) times daily.   01/01/2020 at Unknown time    Inpatient Medications:    stroke: mapping our early stages of recovery book   Does not apply Once   aspirin EC  81 mg Oral Daily   atorvastatin  40 mg Oral Daily   clopidogrel  75 mg Oral Daily   enoxaparin (LOVENOX) injection  40 mg Subcutaneous Q24H   pantoprazole  40 mg Oral Daily   triamcinolone cream   Topical BID    Allergies:  Allergies  Allergen Reactions   Codeine     Severe nausea.  Dizziness.   Macrobid [Nitrofurantoin] Other (See Comments)    Skin eruption    Social History   Socioeconomic History   Marital status: Married    Spouse name: Not on file   Number of children: Not on file   Years of education: Not on file   Highest education level: Not on file  Occupational History   Not on file  Tobacco Use   Smoking status: Never Smoker   Smokeless tobacco: Never Used  Substance and Sexual Activity   Alcohol use: No   Drug use: No   Sexual activity: Not Currently    Partners: Male    Birth control/protection: Surgical    Comment: [facility]  Other Topics Concern   Not on file  Social History Narrative   Not on file   Social Determinants of Health   Financial  Resource Strain:    Difficulty of Paying Living Expenses:   Food Insecurity:    Worried About Charity fundraiser in the Last Year:    Arboriculturist in the Last Year:   Transportation Needs:    Film/video editor (Medical):    Lack of Transportation (Non-Medical):   Physical Activity:    Days of Exercise per Week:    Minutes of Exercise per Session:   Stress:    Feeling of Stress :   Social Connections:    Frequency of Communication with Friends and Family:    Frequency of Social Gatherings with Friends and Family:    Attends Religious Services:    Active Member of Clubs or Organizations:    Attends Archivist Meetings:    Marital Status:  Intimate Partner Violence:    Fear of Current or Ex-Partner:    Emotionally Abused:    Physically Abused:    Sexually Abused:      Family History  Problem Relation Age of Onset   Cancer Mother        breast/mastectomy   Heart disease Mother        poor circulation   Deep vein thrombosis Mother    Osteoporosis Mother    Hypertension Father    Colon cancer Neg Hx       Review of Systems: All other systems reviewed and are otherwise negative except as noted above.  Physical Exam: Vitals:   01/02/20 1915 01/02/20 2237 01/03/20 0346 01/03/20 0744  BP:  (!) 169/66  (!) 118/96  Pulse:      Resp:  18  20  Temp: 98.3 F (36.8 C) 98.4 F (36.9 C) 98.3 F (36.8 C) 98.5 F (36.9 C)  TempSrc: Oral Oral Oral Oral  SpO2:  96%  96%  Weight:      Height:        GEN- The patient is well appearing, alert and oriented x 3 today.   Head- normocephalic, atraumatic Eyes-  Sclera clear, conjunctiva pink Ears- hearing intact Oropharynx- clear Neck- supple Lungs- Clear to ausculation bilaterally, normal work of breathing Heart- Regular rate and rhythm, no murmurs, rubs or gallops  GI- soft, NT, ND, + BS Extremities- no clubbing, cyanosis, or edema MS- no significant deformity or atrophy Skin- no rash or lesion Psych-  euthymic mood, full affect   Labs:   Lab Results  Component Value Date   WBC 5.6 01/03/2020   HGB 13.6 01/03/2020   HCT 40.3 01/03/2020   MCV 99.5 01/03/2020   PLT 200 01/03/2020    Recent Labs  Lab 01/03/20 0506  NA 141  K 3.5  CL 110  CO2 24  BUN 9  CREATININE 0.57  CALCIUM 8.6*  PROT 5.5*  BILITOT 0.2*  ALKPHOS 40  ALT 23  AST 33  GLUCOSE 103*     Radiology/Studies: CT Angio Head W or Wo Contrast  Result Date: 01/01/2020 CLINICAL DATA:  Carotid artery stenosis; stroke, follow-up. Additional provided: Slurred speech, facial numbness, blurred vision. EXAM: CT ANGIOGRAPHY HEAD AND NECK TECHNIQUE: Multidetector CT imaging of the head and neck was performed using the standard protocol during bolus administration of intravenous contrast. Multiplanar CT image reconstructions and MIPs were obtained to evaluate the vascular anatomy. Carotid stenosis measurements (when applicable) are obtained utilizing NASCET criteria, using the distal internal carotid diameter as the denominator. CONTRAST:  5mL OMNIPAQUE IOHEXOL 350 MG/ML SOLN COMPARISON:  Noncontrast head CT performed earlier the same day 01/01/2020 FINDINGS: CTA NECK FINDINGS Aortic arch: The origins of the innominate and left common carotid arteries are excluded from the field of view. Minimal atherosclerotic plaque within the visualized aortic arch. No hemodynamically significant innominate or proximal subclavian artery stenosis within described limitations. Right carotid system: CCA and ICA patent within the neck without significant stenosis (50% or greater). Mild mixed plaque within the carotid bifurcation and proximal ICA. Left carotid system: Within described limitations, CCA and ICA patent within the neck without significant stenosis (50% or greater). Mild mixed plaque within the carotid bifurcation. Vertebral arteries: Codominant and patent within the neck without significant stenosis Skeleton: No acute bony abnormality or  aggressive osseous lesion. Cervical spondylosis without high-grade bony spinal canal narrowing. Other neck: No neck mass or cervical lymphadenopathy. Upper chest: No consolidation within the imaged  lung apices. Review of the MIP images confirms the above findings CTA HEAD FINDINGS Anterior circulation: The intracranial internal carotid arteries are patent. Calcified plaque within these vessels without significant stenosis. The M1 middle cerebral arteries are patent without significant stenosis. There is a high-grade focal stenosis within a superior division mid M2 right MCA branch vessel (series 13, image 14). Additionally, there is a high-grade focal stenosis within a distal inferior division right MCA branch vessel (series 13, image 12). Severe focal stenosis within a superior division proximal M2 left MCA branch (series 8, image 106) (series 10, image 137) (series 12, image 17). Moderate to moderately severe focal stenosis within a superior division mid M2 left MCA branch (series 13, image 29). The anterior cerebral arteries are patent. Moderate/severe focal stenosis within the A2 left anterior cerebral artery (series 12, image 13). No intracranial aneurysm is identified. Posterior circulation: The intracranial vertebral arteries are patent without significant stenosis, as is the basilar artery. Fetal origin left posterior cerebral artery. The right posterior cerebral artery is patent. There is a high-grade stenosis within the P3 right PCA (series 11, image 21). Additionally, there are multifocal high-grade stenoses within distal P2 and more distal left PCA branch vessels (series 11, image 21) (series 13, image 24). The right posterior communicating artery is hypoplastic or absent. Venous sinuses: Within limitations of contrast timing, no convincing thrombus. Anatomic variants: As described Review of the MIP images confirms the above findings These results were called by telephone at the time of interpretation on  01/01/2020 at 1:48 pm to provider JOSHUA LONG , who verbally acknowledged these results. IMPRESSION: CTA neck: 1. The origins of the innominate and left common carotid arteries are excluded from the field of view. 2. Within this limitation, the bilateral common and internal carotid arteries are patent within the neck without significant stenosis (50% or greater). Mild atherosclerotic plaque within the carotid systems as described. 3. The vertebral arteries are patent within the neck bilaterally without significant stenosis. CTA head: 1. No intracranial large vessel occlusion is identified. 2. Intracranial atherosclerotic disease with multifocal stenoses most notably as follows. 3. Severe focal stenosis within a superior division proximal M2 left MCA branch vessel. 4. Moderate to moderately severe focal stenosis within a superior division mid M2 left MCA branch. 5. High-grade focal stenosis within a superior division mid M2 right MCA branch vessel. 6. High-grade focal stenosis within a distal inferior division right MCA branch. 7. Moderate/severe focal stenosis within the A2 left anterior cerebral artery. 8. High-grade stenosis within the P3 right posterior cerebral artery. 9. Multifocal high-grade stenoses within P2 and more distal left PCA branch vessels. Electronically Signed   By: Kellie Simmering DO   On: 01/01/2020 13:49   CT HEAD WO CONTRAST  Result Date: 01/01/2020 CLINICAL DATA:  Slurred speech. Facial numbness and blurred vision. Last seen normal last night. EXAM: CT HEAD WITHOUT CONTRAST TECHNIQUE: Contiguous axial images were obtained from the base of the skull through the vertex without intravenous contrast. COMPARISON:  03/31/2017 FINDINGS: Brain: There is minimal periventricular white matter change consistent with small vessel disease. There is no intra or extra-axial fluid collection or mass lesion. The basilar cisterns and ventricles have a normal appearance. There is no CT evidence for acute infarction  or hemorrhage. Vascular: Significant atherosclerotic calcification of the internal carotid arteries. No hyperdense vessel. Skull: Normal. Negative for fracture or focal lesion. Sinuses/Orbits: Small RIGHT mastoid effusion. Other: None IMPRESSION: 1. No evidence for acute intracranial abnormality. 2. Minimal small vessel  disease. 3. Small RIGHT mastoid effusion. Electronically Signed   By: Nolon Nations M.D.   On: 01/01/2020 11:48   CT Angio Neck W and/or Wo Contrast  Result Date: 01/01/2020 CLINICAL DATA:  Carotid artery stenosis; stroke, follow-up. Additional provided: Slurred speech, facial numbness, blurred vision. EXAM: CT ANGIOGRAPHY HEAD AND NECK TECHNIQUE: Multidetector CT imaging of the head and neck was performed using the standard protocol during bolus administration of intravenous contrast. Multiplanar CT image reconstructions and MIPs were obtained to evaluate the vascular anatomy. Carotid stenosis measurements (when applicable) are obtained utilizing NASCET criteria, using the distal internal carotid diameter as the denominator. CONTRAST:  29mL OMNIPAQUE IOHEXOL 350 MG/ML SOLN COMPARISON:  Noncontrast head CT performed earlier the same day 01/01/2020 FINDINGS: CTA NECK FINDINGS Aortic arch: The origins of the innominate and left common carotid arteries are excluded from the field of view. Minimal atherosclerotic plaque within the visualized aortic arch. No hemodynamically significant innominate or proximal subclavian artery stenosis within described limitations. Right carotid system: CCA and ICA patent within the neck without significant stenosis (50% or greater). Mild mixed plaque within the carotid bifurcation and proximal ICA. Left carotid system: Within described limitations, CCA and ICA patent within the neck without significant stenosis (50% or greater). Mild mixed plaque within the carotid bifurcation. Vertebral arteries: Codominant and patent within the neck without significant stenosis  Skeleton: No acute bony abnormality or aggressive osseous lesion. Cervical spondylosis without high-grade bony spinal canal narrowing. Other neck: No neck mass or cervical lymphadenopathy. Upper chest: No consolidation within the imaged lung apices. Review of the MIP images confirms the above findings CTA HEAD FINDINGS Anterior circulation: The intracranial internal carotid arteries are patent. Calcified plaque within these vessels without significant stenosis. The M1 middle cerebral arteries are patent without significant stenosis. There is a high-grade focal stenosis within a superior division mid M2 right MCA branch vessel (series 13, image 14). Additionally, there is a high-grade focal stenosis within a distal inferior division right MCA branch vessel (series 13, image 12). Severe focal stenosis within a superior division proximal M2 left MCA branch (series 8, image 106) (series 10, image 137) (series 12, image 17). Moderate to moderately severe focal stenosis within a superior division mid M2 left MCA branch (series 13, image 29). The anterior cerebral arteries are patent. Moderate/severe focal stenosis within the A2 left anterior cerebral artery (series 12, image 13). No intracranial aneurysm is identified. Posterior circulation: The intracranial vertebral arteries are patent without significant stenosis, as is the basilar artery. Fetal origin left posterior cerebral artery. The right posterior cerebral artery is patent. There is a high-grade stenosis within the P3 right PCA (series 11, image 21). Additionally, there are multifocal high-grade stenoses within distal P2 and more distal left PCA branch vessels (series 11, image 21) (series 13, image 24). The right posterior communicating artery is hypoplastic or absent. Venous sinuses: Within limitations of contrast timing, no convincing thrombus. Anatomic variants: As described Review of the MIP images confirms the above findings These results were called by  telephone at the time of interpretation on 01/01/2020 at 1:48 pm to provider JOSHUA LONG , who verbally acknowledged these results. IMPRESSION: CTA neck: 1. The origins of the innominate and left common carotid arteries are excluded from the field of view. 2. Within this limitation, the bilateral common and internal carotid arteries are patent within the neck without significant stenosis (50% or greater). Mild atherosclerotic plaque within the carotid systems as described. 3. The vertebral arteries are patent within  the neck bilaterally without significant stenosis. CTA head: 1. No intracranial large vessel occlusion is identified. 2. Intracranial atherosclerotic disease with multifocal stenoses most notably as follows. 3. Severe focal stenosis within a superior division proximal M2 left MCA branch vessel. 4. Moderate to moderately severe focal stenosis within a superior division mid M2 left MCA branch. 5. High-grade focal stenosis within a superior division mid M2 right MCA branch vessel. 6. High-grade focal stenosis within a distal inferior division right MCA branch. 7. Moderate/severe focal stenosis within the A2 left anterior cerebral artery. 8. High-grade stenosis within the P3 right posterior cerebral artery. 9. Multifocal high-grade stenoses within P2 and more distal left PCA branch vessels. Electronically Signed   By: Kellie Simmering DO   On: 01/01/2020 13:49   MR BRAIN WO CONTRAST  Result Date: 01/02/2020 CLINICAL DATA:  Follow-up examination for acute stroke. Neuro deficit. EXAM: MRI HEAD WITHOUT CONTRAST TECHNIQUE: Multiplanar, multiecho pulse sequences of the brain and surrounding structures were obtained without intravenous contrast. COMPARISON:  Comparison made with prior CTs from 01/01/2020. FINDINGS: Brain: Cerebral volume within normal limits for age. Minimal scattered T2/FLAIR hyperintensity within the periventricular and deep white matter both cerebral hemispheres, nonspecific, but most like related  chronic small vessel ischemic disease. Tiny remote right cerebellar infarct noted. Focal area of restricted diffusion seen involving the cortical right frontal operculum and underlying insula, consistent with an acute right MCA territory infarct. Few additional scattered apparently subcentimeter cortical/subcortical infarcts seen posteriorly within the right parietal region. Largest area of ischemia measures up to 2.8 cm. Associated minimal petechial hemorrhage at the right insula without hemorrhagic transformation (series 13, image 26). No significant mass effect. No other evidence for acute or subacute ischemia. Gray-white matter differentiation otherwise maintained. No other areas of remote cortical infarction. No other evidence for acute or chronic intracranial hemorrhage. No mass lesion, midline shift or mass effect. No hydrocephalus or extra-axial fluid collection. Pituitary gland suprasellar region within normal limits. Midline structures intact. Vascular: Major intracranial vascular flow voids are maintained. Skull and upper cervical spine: Craniocervical junction within normal limits. Bone marrow signal intensity within normal limits. No scalp soft tissue abnormality. Sinuses/Orbits: Patient status post bilateral ocular lens replacement. Paranasal sinuses are largely clear. Right greater than left mastoid effusions noted, of doubtful significance. Visualized nasopharynx within normal limits. Inner ear structures grossly normal. Other: None. IMPRESSION: 1. Small volume acute ischemic right MCA territory infarcts as above. Associated minimal petechial hemorrhage without hemorrhagic transformation or mass effect. 2. No other acute intracranial abnormality. 3. Tiny remote right cerebellar infarct. 4. Underlying mild chronic microvascular ischemic disease. Electronically Signed   By: Jeannine Boga M.D.   On: 01/02/2020 03:16   CT CEREBRAL PERFUSION W CONTRAST  Result Date: 01/01/2020 CLINICAL DATA:   Follow-up examination for acute stroke, carotid artery stenosis. EXAM: CT PERFUSION BRAIN TECHNIQUE: Multiphase CT imaging of the brain was performed following IV bolus contrast injection. Subsequent parametric perfusion maps were calculated using RAPID software. CONTRAST:  15mL OMNIPAQUE IOHEXOL 350 MG/ML SOLN, 30mL OMNIPAQUE IOHEXOL 350 MG/ML SOLN COMPARISON:  Comparison made with prior CTA from earlier the same day. FINDINGS: CT Brain Perfusion Findings: CBF (<30%) Volume: 33mL Perfusion (Tmax>6.0s) volume: 60mL Mismatch Volume: 69mL ASPECTS: Not calculated on earlier exams. Infarct Core: 0 mL Infarction Location:Negative CT perfusion for acute core infarct. Note is made of a small 19 cc mild greater than 4 seconds perfusion deficit involving the right cerebral hemisphere, primarily involving the right frontal operculum. Findings suspected to be related  to the previously described proximal right M2 stenoses. No other discernible perfusion deficit. IMPRESSION: 1. Negative CT perfusion with no evidence for acute core infarct. 2. Small 19 CC perfusion deficit involving the right cerebral hemisphere, primarily involving the right frontal operculum, likely related to the previously described proximal right M2 stenoses. Electronically Signed   By: Jeannine Boga M.D.   On: 01/01/2020 20:34   ECHOCARDIOGRAM COMPLETE  Result Date: 01/02/2020    ECHOCARDIOGRAM REPORT   Patient Name:   Doris Ford Date of Exam: 01/02/2020 Medical Rec #:  [id number]        Height:       64.0 in Accession #:    [id number]       Weight:       112.2 lb Date of Birth:  03-Jan-1941        BSA:          1.530 m Patient Age:    79 years         BP:           153/73 mmHg Patient Gender: F                HR:           77 bpm. Exam Location:  Inpatient Procedure: 2D Echo, Cardiac Doppler and Color Doppler Indications:    Stroke  History:        Patient has no prior history of Echocardiogram examinations.                 Risk  Factors:Hypertension.  Sonographer:    Clayton Lefort RDCS (AE) Referring Phys: [place]  1. Left ventricular ejection fraction, by estimation, is 60 to 65%. The left ventricle has normal function. The left ventricle has no regional wall motion abnormalities. There is mild left ventricular hypertrophy. Left ventricular diastolic parameters are consistent with Grade I diastolic dysfunction (impaired relaxation).  2. Right ventricular systolic function is normal. The right ventricular size is normal. There is normal pulmonary artery systolic pressure.  3. The mitral valve is grossly normal. Trivial mitral valve regurgitation.  4. The aortic valve is tricuspid. Aortic valve regurgitation is not visualized.  5. The inferior vena cava is normal in size with greater than 50% respiratory variability, suggesting right atrial pressure of 3 mmHg.  6. Trivial to small pericardial effusion. No tamponade features. FINDINGS  Left Ventricle: Left ventricular ejection fraction, by estimation, is 60 to 65%. The left ventricle has normal function. The left ventricle has no regional wall motion abnormalities. The left ventricular internal cavity size was normal in size. There is  mild left ventricular hypertrophy. Left ventricular diastolic parameters are consistent with Grade I diastolic dysfunction (impaired relaxation). Indeterminate filling pressures. Right Ventricle: The right ventricular size is normal. No increase in right ventricular wall thickness. Right ventricular systolic function is normal. There is normal pulmonary artery systolic pressure. The tricuspid regurgitant velocity is 1.98 m/s, and  with an assumed right atrial pressure of 3 mmHg, the estimated right ventricular systolic pressure is 93.8 mmHg. Left Atrium: Left atrial size was normal in size. Right Atrium: Right atrial size was normal in size. Pericardium: A trivial to small pericardial effusion is present. The pericardial effusion is  circumferential. There is no evidence of cardiac tamponade. Mitral Valve: The mitral valve is grossly normal. Trivial mitral valve regurgitation. MV peak gradient, 2.2 mmHg. The mean mitral valve gradient is 1.0 mmHg. Tricuspid Valve: The tricuspid valve is grossly  normal. Tricuspid valve regurgitation is trivial. Aortic Valve: The aortic valve is tricuspid. Aortic valve regurgitation is not visualized. Aortic valve mean gradient measures 3.0 mmHg. Aortic valve peak gradient measures 6.9 mmHg. Aortic valve area, by VTI measures 1.93 cm. Pulmonic Valve: The pulmonic valve was grossly normal. Pulmonic valve regurgitation is not visualized. Aorta: The aortic root and ascending aorta are structurally normal, with no evidence of dilitation. Venous: The inferior vena cava is normal in size with greater than 50% respiratory variability, suggesting right atrial pressure of 3 mmHg. IAS/Shunts: No atrial level shunt detected by color flow Doppler.  LEFT VENTRICLE PLAX 2D LVIDd:         4.18 cm  Diastology LVIDs:         2.69 cm  LV e' lateral:   5.12 cm/s LV PW:         1.24 cm  LV E/e' lateral: 7.7 LV IVS:        1.10 cm  LV e' medial:    4.05 cm/s LVOT diam:     1.90 cm  LV E/e' medial:  9.7 LV SV:         41 LV SV Index:   27 LVOT Area:     2.84 cm  RIGHT VENTRICLE            IVC RV Basal diam:  2.88 cm    IVC diam: 0.81 cm RV S prime:     8.05 cm/s TAPSE (M-mode): 2.1 cm LEFT ATRIUM           Index       RIGHT ATRIUM           Index LA diam:      2.10 cm 1.37 cm/m  RA Area:     15.90 cm LA Vol (A2C): 29.0 ml 18.95 ml/m RA Volume:   45.50 ml  29.73 ml/m LA Vol (A4C): 30.5 ml 19.93 ml/m  AORTIC VALVE AV Area (Vmax):    1.61 cm AV Area (Vmean):   1.59 cm AV Area (VTI):     1.93 cm AV Vmax:           131.00 cm/s AV Vmean:          78.100 cm/s AV VTI:            0.215 m AV Peak Grad:      6.9 mmHg AV Mean Grad:      3.0 mmHg LVOT Vmax:         74.60 cm/s LVOT Vmean:        43.900 cm/s LVOT VTI:          0.146 m  LVOT/AV VTI ratio: 0.68  AORTA Ao Root diam: 3.20 cm Ao Asc diam:  3.30 cm MITRAL VALVE               TRICUSPID VALVE MV Area (PHT): 1.89 cm    TR Peak grad:   15.7 mmHg MV Peak grad:  2.2 mmHg    TR Vmax:        198.00 cm/s MV Mean grad:  1.0 mmHg MV Vmax:       0.74 m/s    SHUNTS MV Vmean:      39.3 cm/s   Systemic VTI:  0.15 m MV Decel Time: 401 msec    Systemic Diam: 1.90 cm MV E velocity: 39.20 cm/s MV A velocity: 59.40 cm/s MV E/A ratio:  0.66 Lyman Bishop MD Electronically signed by Lyman Bishop MD Signature  Date/Time: 01/02/2020/5:06:28 PM    Final     12-lead ECG 01/02/2020 showed NSR at 93 bpm  (personally reviewed) All prior EKG's in EPIC reviewed with no documented atrial fibrillation  Telemetry NSR 60-70s with occasional PVCs (personally reviewed)  Assessment and Plan:  1. Cryptogenic stroke The patient presents with cryptogenic stroke.  The patient does not have a TEE planned for this AM.  I spoke at length with the patient about monitoring for afib with an implantable loop recorder.  Risks, benefits, and alteratives to implantable loop recorder were discussed with the patient today.   At this time, the patient is very clear in their decision to proceed with implantable loop recorder.   2. Palpitations Non-specific, but leading to increased concern for AF. History more compatible with PVCs are noted on tele.   Wound care was reviewed with the patient (keep incision clean and dry for 3 days).  Wound check scheduled and entered in AVS. Please call with questions.    Shirley Friar, PA-C 01/03/2020 2:17 PM  I have seen and examined this patient with Oda Kilts.  Agree with above, note added to reflect my findings.  On exam, RRR, no murmurs, lungs clear.  Patient presented to the hospital with cryptogenic stroke. To date, no cause has been found. TEE planned for today. If unrevealing, Collie Kittel plan for LINQ monitor to look for atrial fibrillation. Risks and benefits discussed.  Risks include but not limited to bleeding and infection. The patient understands the risks and has agreed to the procedure.  Yeilyn Gent M. Dwanda Tufano MD 01/04/2020 12:44 PM

## 2020-01-03 NOTE — Progress Notes (Signed)
STROKE TEAM PROGRESS NOTE   INTERVAL HISTORY I have personally reviewed history of presenting illness with the patient, electronic medical records and imaging films in PACS.  She presented with left-sided facial weakness numbness which is improved but persisted.  MRI scan shows right MCA branch infarct CT angiogram shows no significant extracranial stenosis with mild atheromatous plaques.  CT angiogram of the brain shows multifocal stenosis of several intracranial vessels.  Echo shows normal ejection fraction without cardiac source of embolism.  LDL cholesterol is under 115 mg percent and hemoglobin A1c is 5.9. Vitals:   01/02/20 1915 01/02/20 2237 01/03/20 0346 01/03/20 0744  BP:  (!) 169/66  (!) 118/96  Pulse:      Resp:  18  20  Temp: 98.3 F (36.8 C) 98.4 F (36.9 C) 98.3 F (36.8 C) 98.5 F (36.9 C)  TempSrc: Oral Oral Oral Oral  SpO2:  96%  96%  Weight:      Height:        CBC:  Recent Labs  Lab 01/02/20 0953 01/03/20 0506  WBC 5.8 5.6  NEUTROABS 4.0 3.4  HGB 13.7 13.6  HCT 41.2 40.3  MCV 99.8 99.5  PLT 206 449    Basic Metabolic Panel:  Recent Labs  Lab 01/02/20 0953 01/03/20 0506  NA 141 141  K 3.3* 3.5  CL 109 110  CO2 22 24  GLUCOSE 160* 103*  BUN 13 9  CREATININE 0.64 0.57  CALCIUM 8.7* 8.6*  MG  --  1.8  PHOS  --  3.1   Lipid Panel:     Component Value Date/Time   CHOL 194 01/03/2020 0506   TRIG 87 01/03/2020 0506   HDL 62 01/03/2020 0506   CHOLHDL 3.1 01/03/2020 0506   VLDL 17 01/03/2020 0506   LDLCALC 115 (H) 01/03/2020 0506   HgbA1c:  Lab Results  Component Value Date   HGBA1C 5.9 (H) 01/02/2020   Urine Drug Screen:     Component Value Date/Time   LABOPIA NONE DETECTED 01/01/2020 1047   COCAINSCRNUR NONE DETECTED 01/01/2020 1047   LABBENZ NONE DETECTED 01/01/2020 1047   AMPHETMU NONE DETECTED 01/01/2020 1047   THCU NONE DETECTED 01/01/2020 1047   LABBARB NONE DETECTED 01/01/2020 1047    Alcohol Level     Component Value  Date/Time   ETH <10 01/01/2020 1055    IMAGING past 24 hours ECHOCARDIOGRAM COMPLETE  Result Date: 01/02/2020    ECHOCARDIOGRAM REPORT   Patient Name:   KATTLEYA KUHNERT Date of Exam: 01/02/2020 Medical Rec #:  [id number]        Height:       64.0 in Accession #:    [id number]       Weight:       112.2 lb Date of Birth:  April 22, 1941        BSA:          1.530 m Patient Age:    79 years         BP:           153/73 mmHg Patient Gender: F                HR:           77 bpm. Exam Location:  Inpatient Procedure: 2D Echo, Cardiac Doppler and Color Doppler Indications:    Stroke  History:        Patient has no prior history of Echocardiogram examinations.  Risk Factors:Hypertension.  Sonographer:    Clayton Lefort RDCS (AE) Referring Phys: [place]  1. Left ventricular ejection fraction, by estimation, is 60 to 65%. The left ventricle has normal function. The left ventricle has no regional wall motion abnormalities. There is mild left ventricular hypertrophy. Left ventricular diastolic parameters are consistent with Grade I diastolic dysfunction (impaired relaxation).  2. Right ventricular systolic function is normal. The right ventricular size is normal. There is normal pulmonary artery systolic pressure.  3. The mitral valve is grossly normal. Trivial mitral valve regurgitation.  4. The aortic valve is tricuspid. Aortic valve regurgitation is not visualized.  5. The inferior vena cava is normal in size with greater than 50% respiratory variability, suggesting right atrial pressure of 3 mmHg.  6. Trivial to small pericardial effusion. No tamponade features. FINDINGS  Left Ventricle: Left ventricular ejection fraction, by estimation, is 60 to 65%. The left ventricle has normal function. The left ventricle has no regional wall motion abnormalities. The left ventricular internal cavity size was normal in size. There is  mild left ventricular hypertrophy. Left ventricular diastolic  parameters are consistent with Grade I diastolic dysfunction (impaired relaxation). Indeterminate filling pressures. Right Ventricle: The right ventricular size is normal. No increase in right ventricular wall thickness. Right ventricular systolic function is normal. There is normal pulmonary artery systolic pressure. The tricuspid regurgitant velocity is 1.98 m/s, and  with an assumed right atrial pressure of 3 mmHg, the estimated right ventricular systolic pressure is 30.1 mmHg. Left Atrium: Left atrial size was normal in size. Right Atrium: Right atrial size was normal in size. Pericardium: A trivial to small pericardial effusion is present. The pericardial effusion is circumferential. There is no evidence of cardiac tamponade. Mitral Valve: The mitral valve is grossly normal. Trivial mitral valve regurgitation. MV peak gradient, 2.2 mmHg. The mean mitral valve gradient is 1.0 mmHg. Tricuspid Valve: The tricuspid valve is grossly normal. Tricuspid valve regurgitation is trivial. Aortic Valve: The aortic valve is tricuspid. Aortic valve regurgitation is not visualized. Aortic valve mean gradient measures 3.0 mmHg. Aortic valve peak gradient measures 6.9 mmHg. Aortic valve area, by VTI measures 1.93 cm. Pulmonic Valve: The pulmonic valve was grossly normal. Pulmonic valve regurgitation is not visualized. Aorta: The aortic root and ascending aorta are structurally normal, with no evidence of dilitation. Venous: The inferior vena cava is normal in size with greater than 50% respiratory variability, suggesting right atrial pressure of 3 mmHg. IAS/Shunts: No atrial level shunt detected by color flow Doppler.  LEFT VENTRICLE PLAX 2D LVIDd:         4.18 cm  Diastology LVIDs:         2.69 cm  LV e' lateral:   5.12 cm/s LV PW:         1.24 cm  LV E/e' lateral: 7.7 LV IVS:        1.10 cm  LV e' medial:    4.05 cm/s LVOT diam:     1.90 cm  LV E/e' medial:  9.7 LV SV:         41 LV SV Index:   27 LVOT Area:     2.84 cm   RIGHT VENTRICLE            IVC RV Basal diam:  2.88 cm    IVC diam: 0.81 cm RV S prime:     8.05 cm/s TAPSE (M-mode): 2.1 cm LEFT ATRIUM  Index       RIGHT ATRIUM           Index LA diam:      2.10 cm 1.37 cm/m  RA Area:     15.90 cm LA Vol (A2C): 29.0 ml 18.95 ml/m RA Volume:   45.50 ml  29.73 ml/m LA Vol (A4C): 30.5 ml 19.93 ml/m  AORTIC VALVE AV Area (Vmax):    1.61 cm AV Area (Vmean):   1.59 cm AV Area (VTI):     1.93 cm AV Vmax:           131.00 cm/s AV Vmean:          78.100 cm/s AV VTI:            0.215 m AV Peak Grad:      6.9 mmHg AV Mean Grad:      3.0 mmHg LVOT Vmax:         74.60 cm/s LVOT Vmean:        43.900 cm/s LVOT VTI:          0.146 m LVOT/AV VTI ratio: 0.68  AORTA Ao Root diam: 3.20 cm Ao Asc diam:  3.30 cm MITRAL VALVE               TRICUSPID VALVE MV Area (PHT): 1.89 cm    TR Peak grad:   15.7 mmHg MV Peak grad:  2.2 mmHg    TR Vmax:        198.00 cm/s MV Mean grad:  1.0 mmHg MV Vmax:       0.74 m/s    SHUNTS MV Vmean:      39.3 cm/s   Systemic VTI:  0.15 m MV Decel Time: 401 msec    Systemic Diam: 1.90 cm MV E velocity: 39.20 cm/s MV A velocity: 59.40 cm/s MV E/A ratio:  0.66 Lyman Bishop MD Electronically signed by Lyman Bishop MD Signature Date/Time: 01/02/2020/5:06:28 PM    Final     PHYSICAL EXAM Pleasant elderly Caucasian lady not in distress.  She is hard of hearing. . Afebrile. Head is nontraumatic. Neck is supple without bruit.    Cardiac exam no murmur or gallop. Lungs are clear to auscultation. Distal pulses are well felt. Neurological Exam : She is awake alert oriented to time place person.  Slow to answer questions.  Speech is slightly hesitant but no paraphasic errors.  Good comprehension naming and repetition.  Extraocular movements are full range without nystagmus.  Slight left peripheral homonymous hemianopsia.  Left facial droop.  Tongue midline.  Hearing diminished bilaterally.  Motor system exam shows no drift but the mild weakness of left grip and  intrinsic hand muscles.  Orbits right over left upper extremity.  Mild weakness of left hip flexors on double simultaneous testing.  Tone is normal sensation is intact plantars downgoing.  Gait not tested. ASSESSMENT/PLAN [name] is a 79 y.o. R handed female with history of osteopenia, hypertension, GI hemorrhage, COVID-19 presenting with slurred speech and numbness in her mouth.   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 Small vessel disease. R mastoid diffusion  CTA head intracranial atherosclerosis w/ multifocal stenoses:  Severe superior proximal L M2, moderate to moderate severe superior L M2, high-grade super mid R M2,  High-grade distal inferior R MCA, moderate / severe L A2, high-grade R P3, high-grade L P2 and distal L PCA branch  bessels  CTA neck mild ICA atherosclerosis.   CT perfusion no core; small 19cc R frontal lobe opercullum perfusion deficit  MRI  R MCA infarcts w/ minimal petechial hemorrhage. Tiny old R cerebellar infarct. Small vessel disease.   2D Echo EF 60-65%. No source of embolus   LE doppler pending   LDL 115  HgbA1c 5.9  Lovenox 40 mg sq daily for VTE prophylaxis  No antithrombotic prior to admission, now on clopidogrel 75 mg daily following load. Add low dose aspirin to plavix x 3 weeks then plavix alone    Therapy recommendations:  OP PT, OT, possible SLP  Disposition:  Return home  Hx palpitations  Recommend loop recorder to look for atrial fibrillation as possible source of stroke   Hypertension  Stable . Permissive hypertension (OK if < 220/120) but gradually normalize in 5-7 days . Long-term BP goal normotensive  Hyperlipidemia  Home meds:  No statin   Added lipitor 40  LDL 115, goal < 70  Continue statin at discharge  Other Stroke Risk Factors  Advanced age  Other Active Problems  Hx COVID  Hx R breast cancer  Hx  gastric ulcer, healed per EGD  10/05/2019, on daily PPI  Hospital day # 2 She presented with some cognitive slowing secondary to right MCA branch infarct likely of embolic etiology.  Strong suspicion for paroxysmal A. fib given prior history of palpitations.  Recommend aspirin Plavix for 3 weeks followed by Plavix alone given prior history of gastric ulcer..  Continue ongoing stroke work-up and aggressive risk factor modification.  Patient may need loop recorder at discharge if paroxysmal A. fib is not found.  Discussed with Dr. Florene Glen.  Greater than 50% time during this 35-minute visit was spent on counseling and coordination of care and answering questions about stroke and stroke prevention Antony Contras, MD Medical Director [place] Pager: [phone] 01/03/2020 5:51 PM To contact Stroke Continuity provider, please refer to [email or web address] After hours, contact General Neurology

## 2020-01-04 ENCOUNTER — Inpatient Hospital Stay (HOSPITAL_COMMUNITY): Payer: Medicare HMO

## 2020-01-04 ENCOUNTER — Encounter (HOSPITAL_COMMUNITY)
Admission: EM | Disposition: A | Discharge status: Home / Self Care | Payer: Self-pay | Source: Home / Self Care | Attending: Family Medicine

## 2020-01-04 DIAGNOSIS — I639 Cerebral infarction, unspecified: Secondary | ICD-10-CM

## 2020-01-04 DIAGNOSIS — R002 Palpitations: Secondary | ICD-10-CM

## 2020-01-04 DIAGNOSIS — E785 Hyperlipidemia, unspecified: Secondary | ICD-10-CM

## 2020-01-04 HISTORY — PX: LOOP RECORDER INSERTION: EP1214

## 2020-01-04 LAB — BASIC METABOLIC PANEL
Anion gap: 9 (ref 5–15)
BUN: 10 mg/dL (ref 8–23)
CO2: 23 mmol/L (ref 22–32)
Calcium: 8.8 mg/dL — ABNORMAL LOW (ref 8.9–10.3)
Chloride: 109 mmol/L (ref 98–111)
Creatinine, Ser: 0.6 mg/dL (ref 0.44–1.00)
GFR calc Af Amer: >60 mL/min (ref >60)
GFR calc non Af Amer: >60 mL/min (ref >60)
Glucose, Bld: 104 mg/dL — ABNORMAL HIGH (ref 70–99)
Potassium: 3.4 mmol/L — ABNORMAL LOW (ref 3.5–5.1)
Sodium: 141 mmol/L (ref 135–145)

## 2020-01-04 LAB — MAGNESIUM: Magnesium: 1.7 mg/dL (ref 1.7–2.4)

## 2020-01-04 SURGERY — LOOP RECORDER INSERTION

## 2020-01-04 MED ORDER — ASPIRIN 81 MG PO TBEC: 81.0000 mg | DELAYED_RELEASE_TABLET | Freq: Every day | ORAL | 0 refills | Status: AC

## 2020-01-04 MED ORDER — LIDOCAINE HCL (PF) 1 % IJ SOLN
INTRAMUSCULAR | Status: DC | PRN
  Administered 2020-01-04: 20 mL

## 2020-01-04 MED ORDER — ATORVASTATIN CALCIUM 40 MG PO TABS: 40.0000 mg | ORAL_TABLET | Freq: Every day | ORAL | 1 refills | Status: AC

## 2020-01-04 MED ORDER — LIDOCAINE-EPINEPHRINE 1 %-1:100000 IJ SOLN
INTRAMUSCULAR | Status: AC
  Filled 2020-01-04: qty 1

## 2020-01-04 MED ORDER — QUINAPRIL HCL 20 MG PO TABS: 20.0000 mg | ORAL_TABLET | Freq: Every day | ORAL | Status: DC

## 2020-01-04 MED ORDER — PANTOPRAZOLE SODIUM 40 MG PO TBEC: 40.0000 mg | DELAYED_RELEASE_TABLET | Freq: Every day | ORAL | 1 refills | Status: DC

## 2020-01-04 MED ORDER — CLOPIDOGREL BISULFATE 75 MG PO TABS: 75.0000 mg | ORAL_TABLET | Freq: Every day | ORAL | 1 refills | Status: AC

## 2020-01-04 MED ORDER — MAGNESIUM SULFATE 4 GM/100ML IV SOLN
4.0000 g | Freq: Once | INTRAVENOUS | Status: AC
  Administered 2020-01-04: 4 g via INTRAVENOUS
  Filled 2020-01-04 (×2): qty 100

## 2020-01-04 MED ORDER — POTASSIUM CHLORIDE CRYS ER 20 MEQ PO TBCR
40.0000 meq | EXTENDED_RELEASE_TABLET | Freq: Once | ORAL | Status: AC
  Administered 2020-01-04: 40 meq via ORAL
  Filled 2020-01-04: qty 2

## 2020-01-04 SURGICAL SUPPLY — 2 items
LOOP REVEAL LINQ LNQ11 (Prosthesis & Implant Heart) ×1 IMPLANT
PACK LOOP INSERTION (CUSTOM PROCEDURE TRAY) ×2 IMPLANT

## 2020-01-04 NOTE — Discharge Instructions (Signed)

## 2020-01-04 NOTE — Plan of Care (Signed)
Adequate for discharge.

## 2020-01-04 NOTE — Progress Notes (Signed)
Lower ext venous study  has been completed. Refer to [facility] under chart review to view preliminary results.   01/04/2020  2:42 PM Lonnell Chaput, Bonnye Fava

## 2020-01-04 NOTE — TOC Progression Note (Signed)
Transition of Care [facility]) - Progression Note    Patient Details  Name: Doris Ford MRN: [id number] Date of Birth: 1941/05/28  Transition of Care [facility]) CM/SW [place], RN Phone Number: [phone]  01/04/2020, 1:04 PM  Clinical Narrative:    Outpatient referral submitted for outpatient PT/OT/SLP at [facility]. Patient and daughter updated at the bedside. Patient confirms that she has transportation for appointments. Information has been added to avs. No further needs at this time.        Expected Discharge Plan and Services                                                 Social Determinants of Health (SDOH) Interventions    Readmission Risk Interventions No flowsheet data found.

## 2020-01-04 NOTE — Progress Notes (Signed)
STROKE TEAM PROGRESS NOTE   INTERVAL HISTORY   I She s sitting up in bedside chair.  Lower extremity venous Dopplers are negative for DVT.  Loop recorder is pending.  Vital signs stable.  No neurological changes.  No new complaints. Vitals:   01/03/20 2000 01/04/20 0000 01/04/20 0400 01/04/20 0741  BP: 134/60 (!) 128/50 120/62 (!) 143/65  Pulse: 71 68 68   Resp: 18 20 17 18   Temp: 98.5 F (36.9 C) 98.7 F (37.1 C) 98.4 F (36.9 C) 98.2 F (36.8 C)  TempSrc: Oral Oral Oral Oral  SpO2: 96%     Weight:      Height:        CBC:  Recent Labs  Lab 01/02/20 0953 01/03/20 0506  WBC 5.8 5.6  NEUTROABS 4.0 3.4  HGB 13.7 13.6  HCT 41.2 40.3  MCV 99.8 99.5  PLT 206 127    Basic Metabolic Panel:  Recent Labs  Lab 01/03/20 0506 01/04/20 0837  NA 141 141  K 3.5 3.4*  CL 110 109  CO2 24 23  GLUCOSE 103* 104*  BUN 9 10  CREATININE 0.57 0.60  CALCIUM 8.6* 8.8*  MG 1.8 1.7  PHOS 3.1  --    Lipid Panel:     Component Value Date/Time   CHOL 194 01/03/2020 0506   TRIG 87 01/03/2020 0506   HDL 62 01/03/2020 0506   CHOLHDL 3.1 01/03/2020 0506   VLDL 17 01/03/2020 0506   LDLCALC 115 (H) 01/03/2020 0506   HgbA1c:  Lab Results  Component Value Date   HGBA1C 5.9 (H) 01/02/2020   Urine Drug Screen:     Component Value Date/Time   LABOPIA NONE DETECTED 01/01/2020 1047   COCAINSCRNUR NONE DETECTED 01/01/2020 1047   LABBENZ NONE DETECTED 01/01/2020 1047   AMPHETMU NONE DETECTED 01/01/2020 1047   THCU NONE DETECTED 01/01/2020 1047   LABBARB NONE DETECTED 01/01/2020 1047    Alcohol Level     Component Value Date/Time   ETH <10 01/01/2020 1055    IMAGING past 24 hours VAS [place] LOWER EXTREMITY VENOUS (DVT)  Result Date: 01/04/2020  Lower Venous DVTStudy Indications: Stroke.  Comparison Study: No priors. Performing Technologist: Darlin Coco  Examination Guidelines: A complete evaluation includes B-mode imaging, spectral Doppler, color Doppler, and power Doppler as needed  of all accessible portions of each vessel. Bilateral testing is considered an integral part of a complete examination. Limited examinations for reoccurring indications may be performed as noted. The reflux portion of the exam is performed with the patient in reverse Trendelenburg.  +---------+---------------+---------+-----------+----------+--------------+ RIGHT    CompressibilityPhasicitySpontaneityPropertiesThrombus Aging +---------+---------------+---------+-----------+----------+--------------+ CFV      Full           Yes      Yes                                 +---------+---------------+---------+-----------+----------+--------------+ SFJ      Full                                                        +---------+---------------+---------+-----------+----------+--------------+ FV Prox  Full                                                        +---------+---------------+---------+-----------+----------+--------------+  FV Mid   Full                                                        +---------+---------------+---------+-----------+----------+--------------+ FV DistalFull                                                        +---------+---------------+---------+-----------+----------+--------------+ PFV      Full                                                        +---------+---------------+---------+-----------+----------+--------------+ POP      Full           Yes      Yes                                 +---------+---------------+---------+-----------+----------+--------------+ PTV      Full                                                        +---------+---------------+---------+-----------+----------+--------------+ PERO     Full                                                        +---------+---------------+---------+-----------+----------+--------------+    +---------+---------------+---------+-----------+----------+--------------+ LEFT     CompressibilityPhasicitySpontaneityPropertiesThrombus Aging +---------+---------------+---------+-----------+----------+--------------+ CFV      Full           Yes      Yes                                 +---------+---------------+---------+-----------+----------+--------------+ SFJ      Full                                                        +---------+---------------+---------+-----------+----------+--------------+ FV Prox  Full                                                        +---------+---------------+---------+-----------+----------+--------------+ FV Mid   Full                                                        +---------+---------------+---------+-----------+----------+--------------+   FV DistalFull                                                        +---------+---------------+---------+-----------+----------+--------------+ PFV      Full                                                        +---------+---------------+---------+-----------+----------+--------------+ POP      Full           Yes      Yes                                 +---------+---------------+---------+-----------+----------+--------------+ PTV      Full                                                        +---------+---------------+---------+-----------+----------+--------------+ PERO     Full                                                        +---------+---------------+---------+-----------+----------+--------------+     Summary: RIGHT: - There is no evidence of deep vein thrombosis in the lower extremity.  - No cystic structure found in the popliteal fossa.  LEFT: - There is no evidence of deep vein thrombosis in the lower extremity.  - No cystic structure found in the popliteal fossa.  *See table(s) above for measurements and observations.    Preliminary      PHYSICAL EXAM Pleasant elderly Caucasian lady not in distress.  She is hard of hearing. . Afebrile. Head is nontraumatic. Neck is supple without bruit.    Cardiac exam no murmur or gallop. Lungs are clear to auscultation. Distal pulses are well felt. Neurological Exam : She is awake alert oriented to time place person.  Slow to answer questions.  Speech is slightly hesitant but no paraphasic errors.  Good comprehension naming and repetition.  Extraocular movements are full range without nystagmus.  Slight left peripheral homonymous hemianopsia.  Left facial droop.  Tongue midline.  Hearing diminished bilaterally.  Motor system exam shows no drift but the mild weakness of left grip and intrinsic hand muscles.  Orbits right over left upper extremity.  Mild weakness of left hip flexors on double simultaneous testing.  Tone is normal sensation is intact plantars downgoing.  Gait not tested. ASSESSMENT/PLAN [name] is a 79 y.o. R handed female with history of osteopenia, hypertension, GI hemorrhage, COVID-19 presenting with slurred speech and numbness in her mouth.   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 Small vessel disease. R mastoid diffusion  CTA head intracranial atherosclerosis w/ multifocal stenoses:  Severe superior proximal L M2, moderate to moderate  severe superior L M2, high-grade super mid R M2,  High-grade distal inferior R MCA, moderate / severe L A2, high-grade R P3, high-grade L P2 and distal L PCA branch bessels  CTA neck mild ICA atherosclerosis.   CT perfusion no core; small 19cc R frontal lobe opercullum perfusion deficit  MRI  R MCA infarcts w/ minimal petechial hemorrhage. Tiny old R cerebellar infarct. Small vessel disease.   2D Echo EF 60-65%. No source of embolus   LE doppler pending   LDL 115  HgbA1c 5.9  Lovenox 40 mg sq daily for VTE prophylaxis  No  antithrombotic prior to admission, now on clopidogrel 75 mg daily following load. Add low dose aspirin to plavix x 3 weeks then plavix alone    Therapy recommendations:  OP PT, OT, possible SLP  Disposition:  Return home  Hx palpitations  Recommend loop recorder to look for atrial fibrillation as possible source of stroke   Hypertension  Stable . Permissive hypertension (OK if < 220/120) but gradually normalize in 5-7 days . Long-term BP goal normotensive  Hyperlipidemia  Home meds:  No statin   Added lipitor 40  LDL 115, goal < 70  Continue statin at discharge  Other Stroke Risk Factors  Advanced age  Other Active Problems  Hx COVID  Hx R breast cancer  Hx gastric ulcer, healed per EGD  10/05/2019, on daily PPI  Hospital day # 3 She presented with some cognitive slowing secondary to right MCA branch infarct likely of embolic etiology.  Strong suspicion for paroxysmal A. fib given prior history of palpitations.  Recommend aspirin Plavix for 3 weeks followed by Plavix alone given prior history of gastric ulcer..  Continue  loop recorder insertion at discharge if paroxysmal A. fib is not found.  Discussed with patient's daughter and Dr. Grandville Silos. greater than 50% time during this 25-minute visit was spent on counseling and coordination of care and answering questions about stroke and stroke prevention.  Stroke team will sign off.  Kindly call for questions. Antony Contras, MD Medical Director [facility] Pager: [phone] 01/04/2020 2:56 PM To contact Stroke Continuity provider, please refer to [email or web address] After hours, contact General Neurology

## 2020-01-04 NOTE — Discharge Summary (Signed)
Physician Discharge Summary  Doris Ford LNL:[id number] DOB: March 06, 1941 DOA: 01/01/2020  PCP: Haywood Pao, MD  Admit date: 01/01/2020 Discharge date: 01/04/2020  Time spent: 55 minutes  Recommendations for Outpatient Follow-up:  1. Follow-up with Dr. [name], neurology in 4 to 6 weeks. 2. Follow-up with [name], [name], MD in 2 weeks.  On follow-up patient will need a basic metabolic profile done to follow-up on electrolytes and renal function.  Patient will need a magnesium level checked.  Patient blood pressure will need to be reassessed as patient's home regimen ACE inhibitor dose was decreased to half by day of discharge. 3. Follow-up with cardiology on 01/17/2020 for post loop recorder implant check. 4. Follow-up for outpatient PT/outpatient OT/outpatient speech therapy.   Discharge Diagnoses:  Principal Problem:   Cerebrovascular accident (CVA) ([place]) Active Problems:   Gastric ulcer with hemorrhage   HTN (hypertension)   Slurred speech   Discharge Condition: Stable and improved  Diet recommendation: Heart healthy  Filed Weights   01/01/20 1045 01/02/20 0125  Weight: 53.5 kg 50.9 kg    History of present illness:  HPI per Dr. Archer Asa MISTIE ADNEY is a 79 y.o. female with a history of hypertension, history of GI bleed, history of right breast cancer status post lumpectomy and tamoxifen, osteopenia.  Patient seen for slurred speech that started the morning of admission, and seemed to be resolving.  Last seen well the night prior to admission.  She did report report waking up in the middle of the night and having some difficulty drinking water with some numbness around her mouth.  Her husband noticed that she did have some slurred speech when she was brought to the hospital for evaluation.  No palliating or provoking factors.  Patient's daughter was present during the interview and notes that her speech was slowed but no slurring was observed.  She denied focal  neurological deficit including numbness, weakness, paresthesias.  Emergency Department Course: CT head was normal.  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  Teleneurology was consulted  Hospital Course:  1 acute ischemic right MCA territory infarction Patient was brought in with strokelike symptoms including concern for slurred speech, difficulty drinking water, numbness around her mouth.  Head CT which was done was negative for any acute abnormalities.  CT angiogram head and neck showed bilateral common and internal, other arteries patent without significant stenosis, vertebral arteries patent without significant stenosis, no intracranial LVO, intracranial atherosclerotic disease with multifocal stenosis.  MRI brain done showed volume acute ischemic right MCA territory infarcts as above.  Associated minimal petechial hemorrhage without hemorrhagic transformation or mass-effect.  No other acute intracranial abnormality.  Tiny remote right cerebellar infarct.  2D echo which was done was negative for any source of emboli.  Fasting lipid panel with LDL of 115.  Lower extremity Dopplers were negative for DVT.  Hemoglobin A1c noted at 5.9.  Neurology was consulted who followed the patient during the hospitalization and recommended dual antiplatelet therapy with aspirin and Plavix for 3 weeks and then subsequently Plavix alone.  Patient was seen by physical therapy who recommended outpatient PT/OT/speech therapy.  Cardiology was consulted for implantable loop recorder placement.  Patient was assessed by cardiology and underwent implantable loop recorder placement on day of discharge without any complications.  Patient will follow up with neurology and cardiology in the outpatient setting.  2.  History of palpitations No arrhythmias noted on telemetry during the hospitalization.  Was assessed by  cardiology for implantable loop recorder placement which was done on  day of discharge 01/04/2020.  Outpatient follow-up with cardiology.  3.  Hypertension Patient's antihypertensive medications were held throughout the hospitalization for permissive hypertension secondary to problem #1.  Patient will be resumed on half her home dose ACE inhibitor on discharge.  Outpatient follow-up with PCP.  4.  Hyperlipidemia Patient was not on a statin prior to admission.  Fasting lipid panel with LDL of 115.  Goal LDL less than 70.  Patient was started on a statin which will be discharged home on.  5.  History of gastric ulcer Patient noted to have undergone a EGD 10/05/2019 per Dr. Joylene John which noted a healed gastric ulcer.  Patient noted to be on PPI daily which was continued during the hospitalization.  Outpatient follow-up.  6.  Hypokalemia Potassium repleted during the hospitalization.  Outpatient follow-up.  Procedures:  Lower extremity Dopplers 01/04/2020  2D echo 01/02/2020  CT head 01/01/2020  CT angiogram head and neck 01/01/2020  CT cerebral perfusion with contrast 01/01/2020  MRI brain 01/02/2020  Implantable loop recorder implantation per Dr. Curt Bears 01/04/2020  Consultations:  TeleNeurology: Dr. Annita Brod 01/01/2020  Neurology: Dr. Leonel Ramsay 01/02/2020  Electrophysiology: Dr. Curt Bears 01/04/2020  Discharge Exam: Vitals:   01/04/20 0741 01/04/20 1708  BP: (!) 143/65 (!) 165/57  Pulse:  68  Resp: 18 17  Temp: 98.2 F (36.8 C)   SpO2:  95%    General: NAD Cardiovascular: RRR Respiratory: CTAB  Discharge Instructions   Discharge Instructions    Ambulatory referral to Occupational Therapy   Complete by: As directed    Ambulatory referral to Occupational Therapy   Complete by: As directed    Ambulatory referral to Physical Therapy   Complete by: As directed    Ambulatory referral to Physical Therapy   Complete by: As directed    Ambulatory referral to Speech Therapy   Complete by: As directed    Ambulatory referral to Speech Therapy   Complete by:  As directed    Diet - low sodium heart healthy   Complete by: As directed    Increase activity slowly   Complete by: As directed    Increase activity slowly   Complete by: As directed      Allergies as of 01/04/2020      Reactions   Codeine    Severe nausea.  Dizziness.   Macrobid [nitrofurantoin] Other (See Comments)   Skin eruption      Medication List    TAKE these medications   acetaminophen 650 MG CR tablet Commonly known as: TYLENOL Take 650-1,300 mg by mouth 2 (two) times daily as needed for pain.   albuterol 108 (90 Base) MCG/ACT inhaler Commonly known as: VENTOLIN HFA Inhale 1-2 puffs into the lungs every 4 (four) hours as needed for wheezing or shortness of breath.   aspirin 81 MG EC tablet Take 1 tablet (81 mg total) by mouth daily for 21 days. Start taking on: January 05, 2020   atorvastatin 40 MG tablet Commonly known as: LIPITOR Take 1 tablet (40 mg total) by mouth daily. Start taking on: January 05, 2020   beta carotene w/minerals tablet Take 1 tablet by mouth daily.   CALTRATE 600 PLUS-VIT D PO Take 1 tablet by mouth daily.   clopidogrel 75 MG tablet Commonly known as: PLAVIX Take 1 tablet (75 mg total) by mouth daily. Start taking on: January 05, 2020   fluticasone 50 MCG/ACT nasal spray Commonly known as: Asencion Islam  Place 1-2 sprays into both nostrils daily as needed for allergies or rhinitis.   loratadine 10 MG tablet Commonly known as: CLARITIN Take 10 mg by mouth daily.   NON FORMULARY Take 1 capsule by mouth daily. Cysta-Q   OPTIVE 0.5-0.9 % ophthalmic solution Generic drug: carboxymethylcellul-glycerin Place 1 drop into both eyes daily as needed for dry eyes.   pantoprazole 40 MG tablet Commonly known as: PROTONIX Take 1 tablet (40 mg total) by mouth daily. Start taking on: January 05, 2020   polyethylene glycol 17 g packet Commonly known as: MIRALAX / GLYCOLAX Take 17 g by mouth daily as needed for mild constipation.   quinapril 20 MG  tablet Commonly known as: ACCUPRIL Take 1 tablet (20 mg total) by mouth daily. What changed: when to take this   Stool Softener 100 MG capsule Generic drug: docusate sodium Take 100 mg by mouth daily as needed (constipation.).   vitamin C 1000 MG tablet Take 1,000 mg by mouth daily.   Vitamin D3 50 MCG (2000 UT) Tabs Take 2,000 Units by mouth daily.      Allergies  Allergen Reactions  . Codeine     Severe nausea.  Dizziness.  Santiago Bur [Nitrofurantoin] Other (See Comments)    Skin eruption   Follow-up Information    [facility] Follow up.   Why: Your outpatient therapy referral has been sent to [facility]. The therapy department will call you will the details of your therapy. If you have any questions please call the number listed above. Contact information: [facility] Follow up on 01/17/2020.   Why: at 230 pm for post loop recorder implant check Contact information: [place] [id number] [phone]       [name], [name], MD. Schedule an appointment as soon as possible for a visit in 2 week(s).   Specialty: Internal Medicine Contact information: [place] [address] [phone]        Garvin Fila, MD. Schedule an appointment as soon as possible for a visit in 4 week(s).   Specialties: Neurology, Radiology Why: Follow-up in 4 to 6 weeks Contact information: [address] [place] [address] [phone]            The results of significant diagnostics from this hospitalization (including imaging, microbiology, ancillary and laboratory) are listed below for reference.    Significant Diagnostic Studies: CT Angio Head W or Wo Contrast  Result Date: 01/01/2020 CLINICAL DATA:  Carotid artery stenosis; stroke, follow-up. Additional provided: Slurred speech, facial  numbness, blurred vision. EXAM: CT ANGIOGRAPHY HEAD AND NECK TECHNIQUE: Multidetector CT imaging of the head and neck was performed using the standard protocol during bolus administration of intravenous contrast. Multiplanar CT image reconstructions and MIPs were obtained to evaluate the vascular anatomy. Carotid stenosis measurements (when applicable) are obtained utilizing NASCET criteria, using the distal internal carotid diameter as the denominator. CONTRAST:  39mL OMNIPAQUE IOHEXOL 350 MG/ML SOLN COMPARISON:  Noncontrast head CT performed earlier the same day 01/01/2020 FINDINGS: CTA NECK FINDINGS Aortic arch: The origins of the innominate and left common carotid arteries are excluded from the field of view. Minimal atherosclerotic plaque within the visualized aortic arch. No hemodynamically significant innominate or proximal subclavian artery stenosis within described limitations. Right carotid system: CCA and ICA patent within the neck without significant stenosis (50% or greater). Mild mixed plaque within  the carotid bifurcation and proximal ICA. Left carotid system: Within described limitations, CCA and ICA patent within the neck without significant stenosis (50% or greater). Mild mixed plaque within the carotid bifurcation. Vertebral arteries: Codominant and patent within the neck without significant stenosis Skeleton: No acute bony abnormality or aggressive osseous lesion. Cervical spondylosis without high-grade bony spinal canal narrowing. Other neck: No neck mass or cervical lymphadenopathy. Upper chest: No consolidation within the imaged lung apices. Review of the MIP images confirms the above findings CTA HEAD FINDINGS Anterior circulation: The intracranial internal carotid arteries are patent. Calcified plaque within these vessels without significant stenosis. The M1 middle cerebral arteries are patent without significant stenosis. There is a high-grade focal stenosis within a superior division mid  M2 right MCA branch vessel (series 13, image 14). Additionally, there is a high-grade focal stenosis within a distal inferior division right MCA branch vessel (series 13, image 12). Severe focal stenosis within a superior division proximal M2 left MCA branch (series 8, image 106) (series 10, image 137) (series 12, image 17). Moderate to moderately severe focal stenosis within a superior division mid M2 left MCA branch (series 13, image 29). The anterior cerebral arteries are patent. Moderate/severe focal stenosis within the A2 left anterior cerebral artery (series 12, image 13). No intracranial aneurysm is identified. Posterior circulation: The intracranial vertebral arteries are patent without significant stenosis, as is the basilar artery. Fetal origin left posterior cerebral artery. The right posterior cerebral artery is patent. There is a high-grade stenosis within the P3 right PCA (series 11, image 21). Additionally, there are multifocal high-grade stenoses within distal P2 and more distal left PCA branch vessels (series 11, image 21) (series 13, image 24). The right posterior communicating artery is hypoplastic or absent. Venous sinuses: Within limitations of contrast timing, no convincing thrombus. Anatomic variants: As described Review of the MIP images confirms the above findings These results were called by telephone at the time of interpretation on 01/01/2020 at 1:48 pm to provider JOSHUA LONG , who verbally acknowledged these results. IMPRESSION: CTA neck: 1. The origins of the innominate and left common carotid arteries are excluded from the field of view. 2. Within this limitation, the bilateral common and internal carotid arteries are patent within the neck without significant stenosis (50% or greater). Mild atherosclerotic plaque within the carotid systems as described. 3. The vertebral arteries are patent within the neck bilaterally without significant stenosis. CTA head: 1. No intracranial large  vessel occlusion is identified. 2. Intracranial atherosclerotic disease with multifocal stenoses most notably as follows. 3. Severe focal stenosis within a superior division proximal M2 left MCA branch vessel. 4. Moderate to moderately severe focal stenosis within a superior division mid M2 left MCA branch. 5. High-grade focal stenosis within a superior division mid M2 right MCA branch vessel. 6. High-grade focal stenosis within a distal inferior division right MCA branch. 7. Moderate/severe focal stenosis within the A2 left anterior cerebral artery. 8. High-grade stenosis within the P3 right posterior cerebral artery. 9. Multifocal high-grade stenoses within P2 and more distal left PCA branch vessels. Electronically Signed   By: Kellie Simmering DO   On: 01/01/2020 13:49   CT HEAD WO CONTRAST  Result Date: 01/01/2020 CLINICAL DATA:  Slurred speech. Facial numbness and blurred vision. Last seen normal last night. EXAM: CT HEAD WITHOUT CONTRAST TECHNIQUE: Contiguous axial images were obtained from the base of the skull through the vertex without intravenous contrast. COMPARISON:  03/31/2017 FINDINGS: Brain: There is minimal periventricular white matter  change consistent with small vessel disease. There is no intra or extra-axial fluid collection or mass lesion. The basilar cisterns and ventricles have a normal appearance. There is no CT evidence for acute infarction or hemorrhage. Vascular: Significant atherosclerotic calcification of the internal carotid arteries. No hyperdense vessel. Skull: Normal. Negative for fracture or focal lesion. Sinuses/Orbits: Small RIGHT mastoid effusion. Other: None IMPRESSION: 1. No evidence for acute intracranial abnormality. 2. Minimal small vessel disease. 3. Small RIGHT mastoid effusion. Electronically Signed   By: Nolon Nations M.D.   On: 01/01/2020 11:48   CT Angio Neck W and/or Wo Contrast  Result Date: 01/01/2020 CLINICAL DATA:  Carotid artery stenosis; stroke, follow-up.  Additional provided: Slurred speech, facial numbness, blurred vision. EXAM: CT ANGIOGRAPHY HEAD AND NECK TECHNIQUE: Multidetector CT imaging of the head and neck was performed using the standard protocol during bolus administration of intravenous contrast. Multiplanar CT image reconstructions and MIPs were obtained to evaluate the vascular anatomy. Carotid stenosis measurements (when applicable) are obtained utilizing NASCET criteria, using the distal internal carotid diameter as the denominator. CONTRAST:  51mL OMNIPAQUE IOHEXOL 350 MG/ML SOLN COMPARISON:  Noncontrast head CT performed earlier the same day 01/01/2020 FINDINGS: CTA NECK FINDINGS Aortic arch: The origins of the innominate and left common carotid arteries are excluded from the field of view. Minimal atherosclerotic plaque within the visualized aortic arch. No hemodynamically significant innominate or proximal subclavian artery stenosis within described limitations. Right carotid system: CCA and ICA patent within the neck without significant stenosis (50% or greater). Mild mixed plaque within the carotid bifurcation and proximal ICA. Left carotid system: Within described limitations, CCA and ICA patent within the neck without significant stenosis (50% or greater). Mild mixed plaque within the carotid bifurcation. Vertebral arteries: Codominant and patent within the neck without significant stenosis Skeleton: No acute bony abnormality or aggressive osseous lesion. Cervical spondylosis without high-grade bony spinal canal narrowing. Other neck: No neck mass or cervical lymphadenopathy. Upper chest: No consolidation within the imaged lung apices. Review of the MIP images confirms the above findings CTA HEAD FINDINGS Anterior circulation: The intracranial internal carotid arteries are patent. Calcified plaque within these vessels without significant stenosis. The M1 middle cerebral arteries are patent without significant stenosis. There is a high-grade  focal stenosis within a superior division mid M2 right MCA branch vessel (series 13, image 14). Additionally, there is a high-grade focal stenosis within a distal inferior division right MCA branch vessel (series 13, image 12). Severe focal stenosis within a superior division proximal M2 left MCA branch (series 8, image 106) (series 10, image 137) (series 12, image 17). Moderate to moderately severe focal stenosis within a superior division mid M2 left MCA branch (series 13, image 29). The anterior cerebral arteries are patent. Moderate/severe focal stenosis within the A2 left anterior cerebral artery (series 12, image 13). No intracranial aneurysm is identified. Posterior circulation: The intracranial vertebral arteries are patent without significant stenosis, as is the basilar artery. Fetal origin left posterior cerebral artery. The right posterior cerebral artery is patent. There is a high-grade stenosis within the P3 right PCA (series 11, image 21). Additionally, there are multifocal high-grade stenoses within distal P2 and more distal left PCA branch vessels (series 11, image 21) (series 13, image 24). The right posterior communicating artery is hypoplastic or absent. Venous sinuses: Within limitations of contrast timing, no convincing thrombus. Anatomic variants: As described Review of the MIP images confirms the above findings These results were called by telephone at the time of  interpretation on 01/01/2020 at 1:48 pm to provider JOSHUA LONG , who verbally acknowledged these results. IMPRESSION: CTA neck: 1. The origins of the innominate and left common carotid arteries are excluded from the field of view. 2. Within this limitation, the bilateral common and internal carotid arteries are patent within the neck without significant stenosis (50% or greater). Mild atherosclerotic plaque within the carotid systems as described. 3. The vertebral arteries are patent within the neck bilaterally without significant  stenosis. CTA head: 1. No intracranial large vessel occlusion is identified. 2. Intracranial atherosclerotic disease with multifocal stenoses most notably as follows. 3. Severe focal stenosis within a superior division proximal M2 left MCA branch vessel. 4. Moderate to moderately severe focal stenosis within a superior division mid M2 left MCA branch. 5. High-grade focal stenosis within a superior division mid M2 right MCA branch vessel. 6. High-grade focal stenosis within a distal inferior division right MCA branch. 7. Moderate/severe focal stenosis within the A2 left anterior cerebral artery. 8. High-grade stenosis within the P3 right posterior cerebral artery. 9. Multifocal high-grade stenoses within P2 and more distal left PCA branch vessels. Electronically Signed   By: Kellie Simmering DO   On: 01/01/2020 13:49   MR BRAIN WO CONTRAST  Result Date: 01/02/2020 CLINICAL DATA:  Follow-up examination for acute stroke. Neuro deficit. EXAM: MRI HEAD WITHOUT CONTRAST TECHNIQUE: Multiplanar, multiecho pulse sequences of the brain and surrounding structures were obtained without intravenous contrast. COMPARISON:  Comparison made with prior CTs from 01/01/2020. FINDINGS: Brain: Cerebral volume within normal limits for age. Minimal scattered T2/FLAIR hyperintensity within the periventricular and deep white matter both cerebral hemispheres, nonspecific, but most like related chronic small vessel ischemic disease. Tiny remote right cerebellar infarct noted. Focal area of restricted diffusion seen involving the cortical right frontal operculum and underlying insula, consistent with an acute right MCA territory infarct. Few additional scattered apparently subcentimeter cortical/subcortical infarcts seen posteriorly within the right parietal region. Largest area of ischemia measures up to 2.8 cm. Associated minimal petechial hemorrhage at the right insula without hemorrhagic transformation (series 13, image 26). No significant  mass effect. No other evidence for acute or subacute ischemia. Gray-white matter differentiation otherwise maintained. No other areas of remote cortical infarction. No other evidence for acute or chronic intracranial hemorrhage. No mass lesion, midline shift or mass effect. No hydrocephalus or extra-axial fluid collection. Pituitary gland suprasellar region within normal limits. Midline structures intact. Vascular: Major intracranial vascular flow voids are maintained. Skull and upper cervical spine: Craniocervical junction within normal limits. Bone marrow signal intensity within normal limits. No scalp soft tissue abnormality. Sinuses/Orbits: Patient status post bilateral ocular lens replacement. Paranasal sinuses are largely clear. Right greater than left mastoid effusions noted, of doubtful significance. Visualized nasopharynx within normal limits. Inner ear structures grossly normal. Other: None. IMPRESSION: 1. Small volume acute ischemic right MCA territory infarcts as above. Associated minimal petechial hemorrhage without hemorrhagic transformation or mass effect. 2. No other acute intracranial abnormality. 3. Tiny remote right cerebellar infarct. 4. Underlying mild chronic microvascular ischemic disease. Electronically Signed   By: Jeannine Boga M.D.   On: 01/02/2020 03:16   EP PPM/ICD IMPLANT  Result Date: 01/04/2020 SURGEON:  Allegra Lai, MD   PREPROCEDURE DIAGNOSIS:  Cryptogenic Stroke   POSTPROCEDURE DIAGNOSIS:  Cryptogenic Stroke    PROCEDURES:  1. Implantable loop recorder implantation   INTRODUCTION:  TANDI HANKO is a 79 y.o. female with a history of unexplained stroke who presents today for implantable loop implantation.  The  patient has had a cryptogenic stroke.  Despite an extensive workup by neurology, no reversible causes have been identified.  she has worn telemetry during which she did not have arrhythmias.  There is significant concern for possible atrial fibrillation as the  cause for the patients stroke.  The patient therefore presents today for implantable loop implantation.   DESCRIPTION OF PROCEDURE:  Informed written consent was obtained, and the patient was brought to the electrophysiology lab in a fasting state.  The patient required no sedation for the procedure today.  Mapping over the patient's chest was performed by the EP lab staff to identify the area where electrograms were most prominent for ILR recording.  This area was found to be the left parasternal region over the 3rd-4th intercostal space. The patients left chest was therefore prepped and draped in the usual sterile fashion by the EP lab staff. The skin overlying the left parasternal region was infiltrated with lidocaine for local analgesia.  A 0.5-cm incision was made over the left parasternal region over the 3rd intercostal space.  A subcutaneous ILR pocket was fashioned using a combination of sharp and blunt dissection.  A Medtronic Reveal Stella model [id number] implantable loop recorder was then placed into the pocket  R waves were very prominent and measured 0.82mV. EBL<1 ml.  Steri- Strips and a sterile dressing were then applied.  There were no early apparent complications.   CONCLUSIONS:  1. Successful implantation of a Medtronic Reveal LINQ implantable loop recorder for cryptogenic stroke  2. No early apparent complications.   CT CEREBRAL PERFUSION W CONTRAST  Result Date: 01/01/2020 CLINICAL DATA:  Follow-up examination for acute stroke, carotid artery stenosis. EXAM: CT PERFUSION BRAIN TECHNIQUE: Multiphase CT imaging of the brain was performed following IV bolus contrast injection. Subsequent parametric perfusion maps were calculated using RAPID software. CONTRAST:  23mL OMNIPAQUE IOHEXOL 350 MG/ML SOLN, 43mL OMNIPAQUE IOHEXOL 350 MG/ML SOLN COMPARISON:  Comparison made with prior CTA from earlier the same day. FINDINGS: CT Brain Perfusion Findings: CBF (<30%) Volume: 39mL Perfusion (Tmax>6.0s)  volume: 29mL Mismatch Volume: 47mL ASPECTS: Not calculated on earlier exams. Infarct Core: 0 mL Infarction Location:Negative CT perfusion for acute core infarct. Note is made of a small 19 cc mild greater than 4 seconds perfusion deficit involving the right cerebral hemisphere, primarily involving the right frontal operculum. Findings suspected to be related to the previously described proximal right M2 stenoses. No other discernible perfusion deficit. IMPRESSION: 1. Negative CT perfusion with no evidence for acute core infarct. 2. Small 19 CC perfusion deficit involving the right cerebral hemisphere, primarily involving the right frontal operculum, likely related to the previously described proximal right M2 stenoses. Electronically Signed   By: Jeannine Boga M.D.   On: 01/01/2020 20:34   ECHOCARDIOGRAM COMPLETE  Result Date: 01/02/2020    ECHOCARDIOGRAM REPORT   Patient Name:   EVONDA ENGE Date of Exam: 01/02/2020 Medical Rec #:  [id number]        Height:       64.0 in Accession #:    [id number]       Weight:       112.2 lb Date of Birth:  Jan 25, 1941        BSA:          1.530 m Patient Age:    59 years         BP:           153/73 mmHg Patient Gender: F  HR:           77 bpm. Exam Location:  Inpatient Procedure: 2D Echo, Cardiac Doppler and Color Doppler Indications:    Stroke  History:        Patient has no prior history of Echocardiogram examinations.                 Risk Factors:Hypertension.  Sonographer:    Clayton Lefort RDCS (AE) Referring Phys: [place]  1. Left ventricular ejection fraction, by estimation, is 60 to 65%. The left ventricle has normal function. The left ventricle has no regional wall motion abnormalities. There is mild left ventricular hypertrophy. Left ventricular diastolic parameters are consistent with Grade I diastolic dysfunction (impaired relaxation).  2. Right ventricular systolic function is normal. The right ventricular size is normal.  There is normal pulmonary artery systolic pressure.  3. The mitral valve is grossly normal. Trivial mitral valve regurgitation.  4. The aortic valve is tricuspid. Aortic valve regurgitation is not visualized.  5. The inferior vena cava is normal in size with greater than 50% respiratory variability, suggesting right atrial pressure of 3 mmHg.  6. Trivial to small pericardial effusion. No tamponade features. FINDINGS  Left Ventricle: Left ventricular ejection fraction, by estimation, is 60 to 65%. The left ventricle has normal function. The left ventricle has no regional wall motion abnormalities. The left ventricular internal cavity size was normal in size. There is  mild left ventricular hypertrophy. Left ventricular diastolic parameters are consistent with Grade I diastolic dysfunction (impaired relaxation). Indeterminate filling pressures. Right Ventricle: The right ventricular size is normal. No increase in right ventricular wall thickness. Right ventricular systolic function is normal. There is normal pulmonary artery systolic pressure. The tricuspid regurgitant velocity is 1.98 m/s, and  with an assumed right atrial pressure of 3 mmHg, the estimated right ventricular systolic pressure is 10.9 mmHg. Left Atrium: Left atrial size was normal in size. Right Atrium: Right atrial size was normal in size. Pericardium: A trivial to small pericardial effusion is present. The pericardial effusion is circumferential. There is no evidence of cardiac tamponade. Mitral Valve: The mitral valve is grossly normal. Trivial mitral valve regurgitation. MV peak gradient, 2.2 mmHg. The mean mitral valve gradient is 1.0 mmHg. Tricuspid Valve: The tricuspid valve is grossly normal. Tricuspid valve regurgitation is trivial. Aortic Valve: The aortic valve is tricuspid. Aortic valve regurgitation is not visualized. Aortic valve mean gradient measures 3.0 mmHg. Aortic valve peak gradient measures 6.9 mmHg. Aortic valve area, by VTI  measures 1.93 cm. Pulmonic Valve: The pulmonic valve was grossly normal. Pulmonic valve regurgitation is not visualized. Aorta: The aortic root and ascending aorta are structurally normal, with no evidence of dilitation. Venous: The inferior vena cava is normal in size with greater than 50% respiratory variability, suggesting right atrial pressure of 3 mmHg. IAS/Shunts: No atrial level shunt detected by color flow Doppler.  LEFT VENTRICLE PLAX 2D LVIDd:         4.18 cm  Diastology LVIDs:         2.69 cm  LV e' lateral:   5.12 cm/s LV PW:         1.24 cm  LV E/e' lateral: 7.7 LV IVS:        1.10 cm  LV e' medial:    4.05 cm/s LVOT diam:     1.90 cm  LV E/e' medial:  9.7 LV SV:         41 LV SV Index:  27 LVOT Area:     2.84 cm  RIGHT VENTRICLE            IVC RV Basal diam:  2.88 cm    IVC diam: 0.81 cm RV S prime:     8.05 cm/s TAPSE (M-mode): 2.1 cm LEFT ATRIUM           Index       RIGHT ATRIUM           Index LA diam:      2.10 cm 1.37 cm/m  RA Area:     15.90 cm LA Vol (A2C): 29.0 ml 18.95 ml/m RA Volume:   45.50 ml  29.73 ml/m LA Vol (A4C): 30.5 ml 19.93 ml/m  AORTIC VALVE AV Area (Vmax):    1.61 cm AV Area (Vmean):   1.59 cm AV Area (VTI):     1.93 cm AV Vmax:           131.00 cm/s AV Vmean:          78.100 cm/s AV VTI:            0.215 m AV Peak Grad:      6.9 mmHg AV Mean Grad:      3.0 mmHg LVOT Vmax:         74.60 cm/s LVOT Vmean:        43.900 cm/s LVOT VTI:          0.146 m LVOT/AV VTI ratio: 0.68  AORTA Ao Root diam: 3.20 cm Ao Asc diam:  3.30 cm MITRAL VALVE               TRICUSPID VALVE MV Area (PHT): 1.89 cm    TR Peak grad:   15.7 mmHg MV Peak grad:  2.2 mmHg    TR Vmax:        198.00 cm/s MV Mean grad:  1.0 mmHg MV Vmax:       0.74 m/s    SHUNTS MV Vmean:      39.3 cm/s   Systemic VTI:  0.15 m MV Decel Time: 401 msec    Systemic Diam: 1.90 cm MV E velocity: 39.20 cm/s MV A velocity: 59.40 cm/s MV E/A ratio:  0.66 Lyman Bishop MD Electronically signed by Lyman Bishop MD Signature  Date/Time: 01/02/2020/5:06:28 PM    Final    VAS [place] LOWER EXTREMITY VENOUS (DVT)  Result Date: 01/04/2020  Lower Venous DVTStudy Indications: Stroke.  Comparison Study: No priors. Performing Technologist: Darlin Coco  Examination Guidelines: A complete evaluation includes B-mode imaging, spectral Doppler, color Doppler, and power Doppler as needed of all accessible portions of each vessel. Bilateral testing is considered an integral part of a complete examination. Limited examinations for reoccurring indications may be performed as noted. The reflux portion of the exam is performed with the patient in reverse Trendelenburg.  +---------+---------------+---------+-----------+----------+--------------+ RIGHT    CompressibilityPhasicitySpontaneityPropertiesThrombus Aging +---------+---------------+---------+-----------+----------+--------------+ CFV      Full           Yes      Yes                                 +---------+---------------+---------+-----------+----------+--------------+ SFJ      Full                                                        +---------+---------------+---------+-----------+----------+--------------+  FV Prox  Full                                                        +---------+---------------+---------+-----------+----------+--------------+ FV Mid   Full                                                        +---------+---------------+---------+-----------+----------+--------------+ FV DistalFull                                                        +---------+---------------+---------+-----------+----------+--------------+ PFV      Full                                                        +---------+---------------+---------+-----------+----------+--------------+ POP      Full           Yes      Yes                                 +---------+---------------+---------+-----------+----------+--------------+ PTV      Full                                                         +---------+---------------+---------+-----------+----------+--------------+ PERO     Full                                                        +---------+---------------+---------+-----------+----------+--------------+   +---------+---------------+---------+-----------+----------+--------------+ LEFT     CompressibilityPhasicitySpontaneityPropertiesThrombus Aging +---------+---------------+---------+-----------+----------+--------------+ CFV      Full           Yes      Yes                                 +---------+---------------+---------+-----------+----------+--------------+ SFJ      Full                                                        +---------+---------------+---------+-----------+----------+--------------+ FV Prox  Full                                                        +---------+---------------+---------+-----------+----------+--------------+  FV Mid   Full                                                        +---------+---------------+---------+-----------+----------+--------------+ FV DistalFull                                                        +---------+---------------+---------+-----------+----------+--------------+ PFV      Full                                                        +---------+---------------+---------+-----------+----------+--------------+ POP      Full           Yes      Yes                                 +---------+---------------+---------+-----------+----------+--------------+ PTV      Full                                                        +---------+---------------+---------+-----------+----------+--------------+ PERO     Full                                                        +---------+---------------+---------+-----------+----------+--------------+     Summary: RIGHT: - There is no evidence of deep vein thrombosis in  the lower extremity.  - No cystic structure found in the popliteal fossa.  LEFT: - There is no evidence of deep vein thrombosis in the lower extremity.  - No cystic structure found in the popliteal fossa.  *See table(s) above for measurements and observations. Electronically signed by Monica Martinez MD on 01/04/2020 at 3:51:05 PM.    Final     Microbiology: Recent Results (from the past 240 hour(s))  SARS Coronavirus 2 by RT PCR (hospital order, performed in [facility] hospital lab) Nasopharyngeal Nasopharyngeal Swab     Status: None   Collection Time: 01/01/20  3:07 PM   Specimen: Nasopharyngeal Swab  Result Value Ref Range Status   SARS Coronavirus 2 NEGATIVE NEGATIVE Final    Comment: (NOTE) SARS-CoV-2 target nucleic acids are NOT DETECTED. The SARS-CoV-2 RNA is generally detectable in upper and lower respiratory specimens during the acute phase of infection. The lowest concentration of SARS-CoV-2 viral copies this assay can detect is 250 copies / mL. A negative result does not preclude SARS-CoV-2 infection and should not be used as the sole basis for treatment or other patient management decisions.  A negative result may occur with improper specimen collection / handling, submission of specimen other than nasopharyngeal swab, presence of viral mutation(s) within the  areas targeted by this assay, and inadequate number of viral copies (<250 copies / mL). A negative result must be combined with clinical observations, patient history, and epidemiological information. Fact Sheet for Patients:   StrictlyIdeas.no Fact Sheet for Healthcare Providers: BankingDealers.co.za This test is not yet approved or cleared  by the [place] FDA and has been authorized for detection and/or diagnosis of SARS-CoV-2 by FDA under an Emergency Use Authorization (EUA).  This EUA will remain in effect (meaning this test can be used) for the duration of  the COVID-19 declaration under Section 564(b)(1) of the Act, 21 U.S.C. section 360bbb-3(b)(1), unless the authorization is terminated or revoked sooner. Performed at [facility], [address], [place], [place] [address]      Labs: Basic Metabolic Panel: Recent Labs  Lab 01/01/20 1055 01/02/20 0953 01/03/20 0506 01/04/20 0837  NA 139 141 141 141  K 3.9 3.3* 3.5 3.4*  CL 102 109 110 109  CO2 26 22 24 23   GLUCOSE 133* 160* 103* 104*  BUN 23 13 9 10   CREATININE 0.76 0.64 0.57 0.60  CALCIUM 9.5 8.7* 8.6* 8.8*  MG  --   --  1.8 1.7  PHOS  --   --  3.1  --    Liver Function Tests: Recent Labs  Lab 01/01/20 1055 01/02/20 0953 01/03/20 0506  AST 26 23 33  ALT 19 12 23   ALKPHOS 53 41 40  BILITOT 0.5 0.6 0.2*  PROT 7.3 5.7* 5.5*  ALBUMIN 4.5 3.4* 3.1*   No results for input(s): LIPASE, AMYLASE in the last 168 hours. No results for input(s): AMMONIA in the last 168 hours. CBC: Recent Labs  Lab 01/01/20 1055 01/02/20 0953 01/03/20 0506  WBC 7.4 5.8 5.6  NEUTROABS 5.5 4.0 3.4  HGB 15.7* 13.7 13.6  HCT 47.0* 41.2 40.3  MCV 99.8 99.8 99.5  PLT 220 206 200   Cardiac Enzymes: No results for input(s): CKTOTAL, CKMB, CKMBINDEX, TROPONINI in the last 168 hours. BNP: BNP (last 3 results) No results for input(s): BNP in the last 8760 hours.  ProBNP (last 3 results) No results for input(s): PROBNP in the last 8760 hours.  CBG: No results for input(s): GLUCAP in the last 168 hours.     Signed:  Irine Seal MD.  [facility] 01/04/2020, 5:40 PM

## 2020-01-04 NOTE — Plan of Care (Signed)
Patient educated on upcoming procedure and plan of care

## 2020-01-04 NOTE — Progress Notes (Signed)
Occupational Therapy Treatment Patient Details Name: Doris Ford MRN: [id number] DOB: [birth year] Today's Date: 01/04/2020    History of present illness 79 y.o. female with history of osteopenia, hypertension, GI hemorrhage, COVID-19, s/p R THA (2015), and R foot 5th MT stress fx (June 2020). She presented to the ED with c/o slurred speech and facial numbness. 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   OT comments  Pt in bed upon arrival, session focused on vision and education of signs and symptoms of a stroke. Pt reports diplopia has resolved. She demonstrated decreased eye endurance during saccades and completion of number cancellation. Pt able to recall "Be FAST" and able to correctly state 2/6 signs and symptoms using acronym, requiring moderate cues for other 4 signs and symptoms. Maintained bed level as results to rule out DVT are not posted yet. Pt will continue to benefit from skilled OT services to maximize safety and independence with ADL/IADL and functional mobility. Will continue to follow acutely and progress as tolerated.     Follow Up Recommendations  Outpatient OT;Supervision - Intermittent    Equipment Recommendations  None recommended by OT    Recommendations for Other Services      Precautions / Restrictions Precautions Precautions: Fall       Mobility Bed Mobility                  Transfers                 General transfer comment: deferred    Balance                                           ADL either performed or assessed with clinical judgement   ADL Overall ADL's : Needs assistance/impaired Eating/Feeding: Set up;Sitting                                     General ADL Comments: bed level session as results to rule out DVT were not in pt's chart     Vision   Vision Assessment?: Yes Eye Alignment: Within Functional Limits Ocular Range of Motion:  Within Functional Limits Alignment/Gaze Preference: Within Defined Limits Tracking/Visual Pursuits: Able to track stimulus in all quads without difficulty Saccades: Decreased speed of saccadic movement(eye fatigue noted) Visual Fields: No apparent deficits Diplopia Assessment: (pt does not report diplopia this date) Additional Comments: pt reports diplopia has resolved;pt demonstrates eye fatigue with saccades;pt completed number cancellation demonstrating decreased endurance with completion of task;educated pt on exercises to work on improving eye endurance;pt requiring extra blinking between saccades   Perception     Praxis      Cognition Arousal/Alertness: Awake/alert Behavior During Therapy: WFL for tasks assessed/performed Overall Cognitive Status: Within Functional Limits for tasks assessed                                          Exercises Exercises: Other exercises Other Exercises Other Exercises: pt recalled 2 signs and symptoms of a stroke using BE FAST accronym;she required max assistance for correct recall of 4 other elements;pt was able to recall accronym BE FAST independently   Shoulder Instructions  General Comments pt's daughter present during session    Pertinent Vitals/ Pain       Pain Assessment: No/denies pain Pain Intervention(s): Monitored during session  Home Living                                          Prior Functioning/Environment              Frequency  Min 2X/week        Progress Toward Goals  OT Goals(current goals can now be found in the care plan section)  Progress towards OT goals: Progressing toward goals  Acute Rehab OT Goals Patient Stated Goal: be active again OT Goal Formulation: With patient Time For Goal Achievement: 01/16/20 Potential to Achieve Goals: Good ADL Goals Pt Will Perform Grooming: with modified independence;standing Pt Will Perform Lower Body Dressing: with  modified independence;sit to/from stand Pt Will Transfer to Toilet: with modified independence;ambulating Additional ADL Goal #1: Pt will demonstrate independence with verbalizing BE FAST signs and symptoms of a stroke.  Plan Discharge plan remains appropriate    Co-evaluation                 AM-PAC OT "6 Clicks" Daily Activity     Outcome Measure   Help from another person eating meals?: A Little Help from another person taking care of personal grooming?: A Little Help from another person toileting, which includes using toliet, bedpan, or urinal?: A Little Help from another person bathing (including washing, rinsing, drying)?: A Little Help from another person to put on and taking off regular upper body clothing?: A Little Help from another person to put on and taking off regular lower body clothing?: A Little 6 Click Score: 18    End of Session    OT Visit Diagnosis: Unsteadiness on feet (R26.81);Other abnormalities of gait and mobility (R26.89);Pain;Low vision, both eyes (H54.2)   Activity Tolerance Patient tolerated treatment well   Patient Left in bed;with call bell/phone within reach;with bed alarm set;with family/visitor present   Nurse Communication Mobility status        Time: 4695-0722 OT Time Calculation (min): 21 min  Charges: OT General Charges $OT Visit: 1 Visit OT Treatments $Self Care/Home Management : 8-22 mins  Helene Kelp OTR/L Acute Rehabilitation Services Office: [place] 01/04/2020, 1:08 PM

## 2020-01-05 ENCOUNTER — Encounter (HOSPITAL_COMMUNITY): Payer: Self-pay | Admitting: Cardiology

## 2020-01-10 ENCOUNTER — Other Ambulatory Visit: Payer: Self-pay

## 2020-01-10 ENCOUNTER — Ambulatory Visit (HOSPITAL_COMMUNITY): Payer: Medicare HMO | Attending: Orthopedic Surgery | Admitting: Specialist

## 2020-01-10 ENCOUNTER — Encounter (HOSPITAL_COMMUNITY): Payer: Self-pay | Admitting: Specialist

## 2020-01-10 DIAGNOSIS — R29898 Other symptoms and signs involving the musculoskeletal system: Secondary | ICD-10-CM | POA: Insufficient documentation

## 2020-01-10 DIAGNOSIS — R41841 Cognitive communication deficit: Secondary | ICD-10-CM | POA: Insufficient documentation

## 2020-01-10 DIAGNOSIS — R29818 Other symptoms and signs involving the nervous system: Secondary | ICD-10-CM | POA: Insufficient documentation

## 2020-01-10 NOTE — Therapy (Signed)
[facility] [address] [place], [place], [address] Phone: [phone]   Fax:  [phone]  Occupational Therapy Evaluation  Patient Details  Name: Doris Ford MRN: [id number] Date of Birth: [birth year] Referring Provider (OT): Dr. Irine Seal   Encounter Date: 01/10/2020   OT End of Session - 01/10/20 2153    Visit Number 1    Number of Visits 1    Authorization Type Aetna Medicare    OT Start Time 1600    OT Stop Time 1645    OT Time Calculation (min) 45 min    Activity Tolerance Patient tolerated treatment well    Behavior During Therapy [facility] for tasks assessed/performed           Past Medical History:  Diagnosis Date  . Arthritis   . Cancer ([place]) 12/07   right breast/tx with lumpectomy and tamoxifen  . COVID-19   . GI hemorrhage   . Hypertension    under control  . Interstitial cystitis   . Osteopenia    no change    Past Surgical History:  Procedure Laterality Date  . BILATERAL SALPINGOOPHORECTOMY  age 24s  . BIOPSY  05/31/2019   Procedure: BIOPSY;  Surgeon: Daneil Dolin, MD;  [facility];  Service: Endoscopy;;  . BREAST SURGERY Right    lumpectomy-multiple times  . BUNIONECTOMY WITH HAMMERTOE RECONSTRUCTION Bilateral ~2000  . CATARACT EXTRACTION Bilateral 2007  . ESOPHAGOGASTRODUODENOSCOPY N/A 05/31/2019   Dr. Gala Romney: widely patent Schatzki ring, non-bleeding cratered ulcer with adherent clot injected/thermally sealed. motled gastric mucosa with benign biopsy negative for H.pylori. plans for 3 month surveillance EGD.  [name] ESOPHAGOGASTRODUODENOSCOPY N/A 10/05/2019   Procedure: ESOPHAGOGASTRODUODENOSCOPY (EGD);  Surgeon: Daneil Dolin, MD;  [facility];  Service: Endoscopy;  Laterality: N/A;  2:00pm - moved up per office  . KNEE SURGERY Bilateral as teen  . LOOP RECORDER INSERTION N/A 01/04/2020   Procedure: LOOP RECORDER INSERTION;  Surgeon: Constance Haw, MD;  Location: [facility];  Service: Cardiovascular;  Laterality: N/A;  . TONSILLECTOMY AND ADENOIDECTOMY  age 4  . 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 TOTAL VAGINAL HYSTERECTOMY  age 55    There were no vitals filed for this visit.   Subjective Assessment - 01/10/20 2133    Subjective  S:  I feel tired,    Patient is accompanied by: Family member    Pertinent History Patient is a n79 y.o. female with history of osteopenia, hypertension, GI hemorrhage, COVID-19, s/p R THA 2015, and R foot 5th MT stress fx June 2020. She presented to the ED on 01/01/20 with c/o slurred speech and facial numbness. 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  Patient was treated by PT, OT, ST in acute care setting and dc home on 01/04/20 after cardiac loop placement with recommendations for outpatient OT evaluation.    Limitations FOTO not completed 1 time visit    Patient Stated Goals to get back to where I was before this happened.             [facility] OT Assessment - 01/10/20 0001      Assessment   Medical Diagnosis S/P CVA    Referring Provider (OT) Dr. Irine Seal    Onset Date/Surgical Date 01/01/20  Hand Dominance Right    Next MD Visit unknown    Prior Therapy PT, OT, ST in acute setting      Precautions   Precautions Fall      Restrictions   Weight Bearing Restrictions No      Balance Screen   Has the patient fallen in the past 6 months No    Has the patient had a decrease in activity level because of a fear of falling?  No    Is the patient reluctant to leave their home because of a fear of falling?  No      Home  Environment   Family/patient expects to be discharged to: Private residence    Living Arrangements Spouse/significant other    Available Help at Discharge Family    Type of  [place]      Prior Function   Level of Independence Independent   driving   Vocation Retired    Leisure tai chi, yoga      ADL   ADL comments able to dress and bath with minimal set up.  has not attempted cooking as family and friends are bringing food over, has not attempted driving.  has attempted laundry.  patient finds she fatigues quickly and has been hesitant to nap      Mobility   Mobility Status Independent      Written Expression   Dominant Hand Right    Handwriting 100% legible      Vision - History   Baseline Vision Wears glasses only for reading      Vision Assessment   Eye Alignment Within Functional Limits    Vision Assessment Vision tested    Ocular Range of Motion Within Functional Limits    Alignment/Gaze Preference Within Defined Limits    Tracking/Visual Pursuits Able to track stimulus in all quads without difficulty    Saccades Within functional limits    Visual Fields No apparent deficits    Acuity Able to read newsprint without difficulty    Diplopia Assessment Present in far gaze    Comment delayed focusing when turning head and eyes at same time same direction      Activity Tolerance   Activity Tolerance Comments fatigues quicker than she did previous to the stroke       Cognition   Overall Cognitive Status Within Functional Limits for tasks assessed      Observation/Other Assessments   Focus on Therapeutic Outcomes (FOTO)  not completed due to 1 time assessment      Sensation   Light Touch Appears Intact      Coordination   Gross Motor Movements are Fluid and Coordinated Yes    Fine Motor Movements are Fluid and Coordinated Yes    9 Hole Peg Test Right;Left    Right 9 Hole Peg Test 27.96"    Left 9 Hole Peg Test 30"      ROM / Strength   AROM / PROM / Strength AROM;Strength      AROM   Overall AROM Comments BUE A/ROM is WNL      Strength   Overall Strength Comments BUE Strength is WNL       Hand Function   Right Hand Grip (lbs)  35    Right Hand Lateral Pinch 6 lbs    Right Hand 3 Point Pinch 9 lbs    Left Hand Grip (lbs) 25    Left Hand Lateral Pinch 4 lbs    Left 3 point  pinch 9 lbs                    OT Treatments/Exercises (OP) - 01/10/20 0001      ADLs   ADL Education Given Yes    General Comments educated patient on benefits of energy conservation principles including pace, priortize, plan, position and encouraged patient to take rest breaks and pace activities throughout the day as she is recovering from her stroke                 OT Education - 01/10/20 2138    Education Details Reviewed 4 P's of work simplification and energy conservation and Oceanographer) Educated Patient;Child(ren)    Methods Explanation;Handout    Comprehension Verbalized understanding            OT Short Term Goals - 01/10/20 2201      OT SHORT TERM GOAL #1   Title Patient will be educated on energy conservation techinques to utilize while recovering from CVA.    Time 1    Period Days    Status Achieved    Target Date 01/11/20                    Plan - 01/10/20 2154    Clinical Impression Statement A:  79 y.o. female with history of osteopenia, hypertension, GI hemorrhage, COVID-19, s/p R THA (2015), and R foot 5th MT stress fx (June 2020). She presented to the ED with c/o slurred speech and facial numbness. 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  Upon evaluation, patient is found to have WFL BUE A/ROM, strength, grip and pinch strength, coordination, and sensation.  Patient complains of minimal diplopia when looking at long distance items and delayed focusing when moving head and eyes simultaneously.  Vision assessment found tracking, saccades, peripheral vision, scanning to be [facility].  No skilled need for OT intervention noted at this time.  OT provided educated  on energy consevation:  pace, prioritize, plan, and position.    OT Occupational Profile and History Problem Focused Assessment - Including review of records relating to presenting problem    Occupational performance deficits (Please refer to evaluation for details): ADL's    Body Structure / Function / Physical Skills Endurance    Rehab Potential Good    Clinical Decision Making Limited treatment options, no task modification necessary    Comorbidities Affecting Occupational Performance: None    Modification or Assistance to Complete Evaluation  No modification of tasks or assist necessary to complete eval    OT Frequency One time visit    OT Treatment/Interventions Energy conservation;Patient/family education    Plan P:  One time evaluation completed this date with patient and family education on energy conservation.  Patient d/c from skilled OT intervention this date.    OT Home Exercise Plan energy conservation    Consulted and Agree with Plan of Care Patient;Family member/caregiver    Family Member Consulted daughter           Patient will benefit from skilled therapeutic intervention in order to improve the following deficits and impairments:   Body Structure / Function / Physical Skills: Endurance       Visit Diagnosis: Other symptoms and signs involving the musculoskeletal system - Plan: Ot plan of care cert/re-cert    Problem List Patient Active  Problem List   Diagnosis Date Noted  . Palpitations   . Hyperlipidemia   . Cerebrovascular accident (CVA) ([place])   . Slurred speech 01/01/2020  . Gastric ulcer with hemorrhage but without obstruction 06/02/2019  . GI bleed 05/31/2019  . Gastric ulcer with hemorrhage 05/31/2019  . HTN (hypertension) 05/31/2019  . Arthritis of right hip 12/16/2013  . 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 Chronic interstitial cystitis 11/26/2011    Vangie Bicker, [place], OTR/L [phone]  01/10/2020, 10:15 PM  [facility] [address] [place], [place], [address] Phone: [phone]   Fax:  [phone]  Name: Doris Ford MRN: [id number] Date of Birth: [birth year]

## 2020-01-12 ENCOUNTER — Other Ambulatory Visit: Payer: Self-pay

## 2020-01-12 ENCOUNTER — Ambulatory Visit (HOSPITAL_COMMUNITY): Payer: Medicare HMO | Admitting: Physical Therapy

## 2020-01-12 ENCOUNTER — Encounter (HOSPITAL_COMMUNITY): Payer: Self-pay | Admitting: Physical Therapy

## 2020-01-12 DIAGNOSIS — R41841 Cognitive communication deficit: Secondary | ICD-10-CM | POA: Diagnosis not present

## 2020-01-12 DIAGNOSIS — R29818 Other symptoms and signs involving the nervous system: Secondary | ICD-10-CM | POA: Diagnosis not present

## 2020-01-12 DIAGNOSIS — R29898 Other symptoms and signs involving the musculoskeletal system: Secondary | ICD-10-CM | POA: Diagnosis not present

## 2020-01-12 NOTE — Therapy (Signed)
[facility] [address] [place], [place], [address] Phone: [phone]   Fax:  [phone]  Physical Therapy Evaluation  Patient Details  Name: Doris Ford MRN: [id number] Date of Birth: 1941/02/25 No data recorded  Encounter Date: 01/12/2020   PT End of Session - 01/12/20 1525    Visit Number 1    Number of Visits 1    Authorization Type Aetna Medicare HMO    PT Start Time 7494    PT Stop Time 1515    PT Time Calculation (min) 42 min    Equipment Utilized During Treatment Gait belt    Activity Tolerance Patient tolerated treatment well    Behavior During Therapy [facility] for tasks assessed/performed           Past Medical History:  Diagnosis Date  . Arthritis   . Cancer ([place]) 12/07   right breast/tx with lumpectomy and tamoxifen  . COVID-19   . GI hemorrhage   . Hypertension    under control  . Interstitial cystitis   . Osteopenia    no change    Past Surgical History:  Procedure Laterality Date  . BILATERAL SALPINGOOPHORECTOMY  age 25s  . BIOPSY  05/31/2019   Procedure: BIOPSY;  Surgeon: Daneil Dolin, MD;  [facility];  Service: Endoscopy;;  . BREAST SURGERY Right    lumpectomy-multiple times  . BUNIONECTOMY WITH HAMMERTOE RECONSTRUCTION Bilateral ~2000  . CATARACT EXTRACTION Bilateral 2007  . ESOPHAGOGASTRODUODENOSCOPY N/A 05/31/2019   Dr. Gala Romney: widely patent Schatzki ring, non-bleeding cratered ulcer with adherent clot injected/thermally sealed. motled gastric mucosa with benign biopsy negative for H.pylori. plans for 3 month surveillance EGD.  [name] ESOPHAGOGASTRODUODENOSCOPY N/A 10/05/2019   Procedure: ESOPHAGOGASTRODUODENOSCOPY (EGD);  Surgeon: Daneil Dolin, MD;  [facility];  Service: Endoscopy;  Laterality: N/A;  2:00pm - moved up per office  . KNEE SURGERY Bilateral as teen  . LOOP RECORDER INSERTION N/A 01/04/2020   Procedure: LOOP RECORDER INSERTION;  Surgeon: Constance Haw,  MD;  Location: [facility];  Service: Cardiovascular;  Laterality: N/A;  . TONSILLECTOMY AND ADENOIDECTOMY  age 45  . 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 TOTAL VAGINAL HYSTERECTOMY  age 50    There were no vitals filed for this visit.    Subjective Assessment - 01/12/20 1438    Subjective Patient reported that she had a stroke on January 01, 2020. She stated that since the stroke she feels like since the stroke she's moving a little slower, but overall she does not feel like she has any issues. Patient reported that prior to the stroke she was doing Tai Chi and yoga, but she stated she has only just started returning to [place].    Patient is accompained by: Family member   Daughter, Doris Ford   Pertinent History S/P Ischemic RT MCA 01/01/20    Diagnostic tests MRI    Patient Stated Goals Does not feel she has any deficits    Currently in Pain? No/denies              [facility] PT Assessment - 01/12/20 0001      Assessment   Medical Diagnosis S/P CVA    Onset Date/Surgical Date 01/01/20    Next MD Visit unknown    Prior Therapy PT, OT, ST  in acute setting      Precautions   Precautions Fall      Restrictions   Weight Bearing Restrictions No      [place] residence    Living Arrangements Spouse/significant other    Type of [place] Access Level entry      Prior Function   Level of [place] Retired    Leisure tai chi, yoga      Cognition   Overall Cognitive Status Within Functional Limits for tasks assessed      Observation/Other Assessments   Focus on Therapeutic Outcomes (FOTO)  not completed due to 1 time assessment      Sensation   Light Touch Appears Intact      ROM / Strength   AROM / PROM / Strength Strength      Strength     Strength Assessment Site Hip;Knee;Ankle    Right/Left Hip Right;Left    Right Hip Flexion 5/5    Right Hip Extension 4+/5    Right Hip ABduction 5/5    Left Hip Flexion 5/5    Left Hip Extension 4+/5    Left Hip ABduction 5/5    Right/Left Knee Right;Left    Right Knee Flexion 5/5    Right Knee Extension 5/5    Left Knee Flexion 5/5    Left Knee Extension 5/5    Right/Left Ankle Right;Left    Right Ankle Dorsiflexion 5/5    Left Ankle Dorsiflexion 5/5      Transfers   Transfers Sit to Stand;Stand to Sit    Sit to Stand 7: Independent    Stand to Sit 7: Independent      Ambulation/Gait   Ambulation/Gait Yes    Ambulation/Gait Assistance 6: Modified independent (Device/Increase time)    Ambulation Distance (Feet) 350 Feet   2MWT   Assistive device None    Ambulation Surface Level;Indoor    Gait Comments Gait pattern WFL, with slightly decreased gait velocity      Standardized Balance Assessment   Standardized Balance Assessment Berg Balance Test      Berg Balance Test   Sit to Stand Able to stand without using hands and stabilize independently    Standing Unsupported Able to stand safely 2 minutes    Sitting with Back Unsupported but Feet Supported on Floor or Stool Able to sit safely and securely 2 minutes    Stand to Sit Sits safely with minimal use of hands    Transfers Able to transfer safely, minor use of hands    Standing Unsupported with Eyes Closed Able to stand 10 seconds safely    Standing Unsupported with Feet Together Able to place feet together independently and stand 1 minute safely    From Standing, Reach Forward with Outstretched Arm Can reach confidently >25 cm (10")    From Standing Position, Pick up Object from Floor Able to pick up shoe safely and easily    From Standing Position, Turn to Look Behind Over each Shoulder Looks behind from both sides and weight shifts well    Turn 360 Degrees Able to turn 360 degrees safely in 4 seconds or less     Standing Unsupported, Alternately Place Feet on Step/Stool Able to stand independently and safely and complete 8 steps in 20 seconds    Standing Unsupported, One Foot in Front Needs help to step but can hold 15 seconds  Standing on One Leg Able to lift leg independently and hold 5-10 seconds    Total Score 52                      Objective measurements completed on examination: See above findings.               PT Education - 01/12/20 1524    Education Details Discussed examination findings, POC, HEP, and slowly increasing walking.    Person(s) Educated Patient;Child(ren)    Methods Explanation;Handout    Comprehension Verbalized understanding            PT Short Term Goals - 01/12/20 1526      PT SHORT TERM GOAL #1   Title Patient will be educated on HEP, increasing ambulation, and slowly increasing activity to improve endurance and report understanding.    Time 1    Period Days    Status Achieved    Target Date 01/12/20                     Plan - 01/12/20 1536    Clinical Impression Statement Patient is an 79 y.o. female with history of osteopenia, hypertension, GI hemorrhage, COVID-19, s/p R THA 2015, and R foot 5th MT stress fx June 2020. She presented to the ED on 01/01/20 with c/o slurred speech and facial numbness. 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  Patient was treated by PT, OT, ST in acute care setting and dc home on 01/04/20 after cardiac loop placement with recommendations for outpatient physical therapy evaluation. Upon examination, patient demonstrated good balance as evidence of the Berg Balance Scale score of 52/56. Patient also demonstrated good lower extremity strength and coordination. Patient did demonstrate slightly decreased gait velocity on the 2MWT however, patient demonstrated good balance and gait mechanics. Educated patient on continuing to slowly increase walking by  increments at home as patient's balance indicates she would be safe to do so. Also educated patient on HEP for hip strengthening and to work on the couple areas patient did not score perfectly on the [employer]. At this time, do not recommend further physical therapy, as patient demonstrates no significant deficits and as patient reports feeling that she does not have any deficits. Provided patient and family with the clinic phone number to contact us if they have any questions or concerns in the future.    Personal Factors and Comorbidities Age;Comorbidity 3+    Comorbidities See above    Stability/Clinical Decision Making Stable/Uncomplicated    Clinical Decision Making Low    Rehab Potential Good    PT Frequency One time visit    PT Treatment/Interventions ADLs/Self Care Home Management;Patient/family education    PT Next Visit Plan No follow-up    PT Home Exercise Plan 01/12/20: SLS at counter, Tandem stance at counter, walking program, bridges, sit to stands    Consulted and Agree with Plan of Care Patient;Family member/caregiver    Family Member Consulted Daughter, Doris Ford           Patient will benefit from skilled therapeutic intervention in order to improve the following deficits and impairments:  Decreased activity tolerance  Visit Diagnosis: Other symptoms and signs involving the nervous system     Problem List Patient Active Problem List   Diagnosis Date Noted  . Palpitations   . Hyperlipidemia   . Cerebrovascular accident (CVA) ([place])   . Slurred speech 01/01/2020  .  Gastric ulcer with hemorrhage but without obstruction 06/02/2019  . GI bleed 05/31/2019  . Gastric ulcer with hemorrhage 05/31/2019  . HTN (hypertension) 05/31/2019  . Arthritis of right hip 12/16/2013  . 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 Chronic interstitial cystitis 11/26/2011   Clarene Critchley PT, DPT 3:40 PM, 01/12/20 [place], [place], [address] Phone: [phone]   Fax:  [phone]  Name: Doris Ford MRN: [id number] Date of Birth: 1941/06/07

## 2020-01-12 NOTE — Patient Instructions (Signed)
[place] access code: [email or web address]

## 2020-01-16 ENCOUNTER — Encounter (HOSPITAL_COMMUNITY): Payer: Self-pay | Admitting: Speech Pathology

## 2020-01-16 ENCOUNTER — Ambulatory Visit (HOSPITAL_COMMUNITY): Payer: Medicare HMO | Admitting: Speech Pathology

## 2020-01-16 ENCOUNTER — Other Ambulatory Visit: Payer: Self-pay

## 2020-01-16 ENCOUNTER — Telehealth: Payer: Self-pay | Admitting: Cardiology

## 2020-01-16 DIAGNOSIS — R29898 Other symptoms and signs involving the musculoskeletal system: Secondary | ICD-10-CM | POA: Diagnosis not present

## 2020-01-16 DIAGNOSIS — R41841 Cognitive communication deficit: Secondary | ICD-10-CM

## 2020-01-16 DIAGNOSIS — R29818 Other symptoms and signs involving the nervous system: Secondary | ICD-10-CM | POA: Diagnosis not present

## 2020-01-16 NOTE — Telephone Encounter (Signed)
Called and spoke to patients daughter informed that pt had recent cva, informed her that patient may have 1 visitor to accompany her. Added to apt. Notes.

## 2020-01-16 NOTE — Therapy (Signed)
[place], [place], [address] Phone: [phone]   Fax:  [phone]  Speech Language Pathology Evaluation  Patient Details  Name: Doris Ford MRN: [id number] Date of Birth: November 02, 1940 Referring Provider (SLP): Eugenie Filler, MD   Encounter Date: 01/16/2020   End of Session - 01/16/20 0935    Visit Number 1    Number of Visits 1    Authorization Type [employer]    SLP Start Time 0907    SLP Stop Time  0942    SLP Time Calculation (min) 35 min    Activity Tolerance Patient tolerated treatment well           Past Medical History:  Diagnosis Date  . Arthritis   . Cancer ([place]) 12/07   right breast/tx with lumpectomy and tamoxifen  . COVID-19   . GI hemorrhage   . Hypertension    under control  . Interstitial cystitis   . Osteopenia    no change    Past Surgical History:  Procedure Laterality Date  . BILATERAL SALPINGOOPHORECTOMY  age 7s  . BIOPSY  05/31/2019   Procedure: BIOPSY;  Surgeon: Daneil Dolin, MD;  Location: [facility];  Service: Endoscopy;;  . BREAST SURGERY Right    lumpectomy-multiple times  . BUNIONECTOMY WITH HAMMERTOE RECONSTRUCTION Bilateral ~2000  . CATARACT EXTRACTION Bilateral 2007  . ESOPHAGOGASTRODUODENOSCOPY N/A 05/31/2019   Dr. Gala Romney: widely patent Schatzki ring, non-bleeding cratered ulcer with adherent clot injected/thermally sealed. motled gastric mucosa with benign biopsy negative for H.pylori. plans for 3 month surveillance EGD.  [name] ESOPHAGOGASTRODUODENOSCOPY N/A 10/05/2019   Procedure: ESOPHAGOGASTRODUODENOSCOPY (EGD);  Surgeon: Daneil Dolin, MD;  Location: [facility];  Service: Endoscopy;  Laterality: N/A;  2:00pm - moved up per office  . KNEE SURGERY Bilateral as teen  . LOOP RECORDER INSERTION N/A 01/04/2020   Procedure: LOOP RECORDER INSERTION;  Surgeon: Constance Haw, MD;  Location: [facility];  Service: Cardiovascular;   Laterality: N/A;  . TONSILLECTOMY AND ADENOIDECTOMY  age 62  . 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 TOTAL VAGINAL HYSTERECTOMY  age 7    There were no vitals filed for this visit.   Subjective Assessment - 01/16/20 0927    Subjective "My speech was slurred at first and now I just get tired."    Patient is accompained by: Family member    Special Tests SLUMS    Currently in Pain? No/denies              SLP Evaluation [facility] - 01/16/20 5248      SLP Visit Information   SLP Received On 01/16/20    Referring Provider (SLP) Eugenie Filler, MD    Onset Date 01/01/2020    Medical Diagnosis R CVA      General Information   HPI Doris Ford is an 79 y.o. female with history of osteopenia, hypertension, GI hemorrhage, COVID-19, s/p R THA (2015), and R foot 5th MT stress fx (June 2020). She presented to the ED on 01/01/20 with c/o slurred speech and facial numbness. 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 She was discharged home from the hospital on 6//9/21 and was referred by Dr. Irine Seal for outpatient SLE.     Behavioral/Cognition alert and  cooperative    Mobility Status ambulatory      Balance Screen   Has the patient fallen in the past 6 months No    Has the patient had a decrease in activity level because of a fear of falling?  No    Is the patient reluctant to leave their home because of a fear of falling?  No      Prior Functional Status   Cognitive/Linguistic Baseline Within functional limits    Type of Home House     Lives With Spouse    Available Support Family    Education 12th grade    Vocation Retired      Pain Assessment   Pain Assessment No/denies pain      Cognition   Overall Cognitive Status Within Functional Limits for tasks assessed     Memory Appears intact    Awareness Appears intact    Problem Solving Appears intact      Auditory Comprehension   Overall Auditory Comprehension Appears within functional limits for tasks assessed    Yes/No Questions Within Functional Limits    Commands Within Functional Limits    Conversation Complex      Visual Recognition/Discrimination   Discrimination Within Function Limits      Reading Comprehension   Reading Status Within funtional limits      Expression   Primary Mode of Expression Verbal      Verbal Expression   Overall Verbal Expression Appears within functional limits for tasks assessed    Initiation No impairment    Automatic Speech Name;Social Response    Level of Generative/Spontaneous Verbalization Conversation    Repetition No impairment    Naming No impairment    Pragmatics No impairment    Non-Verbal Means of Communication Not applicable      Written Expression   Dominant Hand Right    Written Expression Within Functional Limits      Oral Motor/Sensory Function   Overall Oral Motor/Sensory Function Appears within functional limits for tasks assessed      Motor Speech   Overall Motor Speech Appears within functional limits for tasks assessed    Respiration Within functional limits    Phonation Normal    Resonance Within functional limits    Articulation Within functional limitis    Intelligibility Intelligible    Motor Planning Witnin functional limits    Motor Speech Errors Not applicable    Phonation WFL      Standardized Assessments   Standardized Assessments  --   SLUMS, 27/30            SLP Short Term Goals - 01/16/20 0937      SLP SHORT TERM GOAL #1   Title N/A              Plan - 01/16/20 0936    Clinical Impression Statement Pt presents with normal cognitive linguistic skills. Pt denies any changes in swallowing, speech, language, or cognition. She scored a 27/30 on the SLUMS (recalled 4/5 words and 6/8 for paragraph recall).  Pt initially with some changes in her speech after her stroke, however it has returned to baseline. Her daughter confirmed that Pt is back to her baseline (present for evaluation). No further SLP services indicated at this time.    Duration 1 week    Consulted and Agree with Plan of Care Patient;Family member/caregiver    Family Member Consulted Daughter, [name]           Patient will  benefit from skilled therapeutic intervention in order to improve the following deficits and impairments:   Cognitive communication deficit    Problem List Patient Active Problem List   Diagnosis Date Noted  . Palpitations   . Hyperlipidemia   . Cerebrovascular accident (CVA) ([place])   . Slurred speech 01/01/2020  . Gastric ulcer with hemorrhage but without obstruction 06/02/2019  . GI bleed 05/31/2019  . Gastric ulcer with hemorrhage 05/31/2019  . HTN (hypertension) 05/31/2019  . Arthritis of right hip 12/16/2013  . 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 Chronic interstitial cystitis 11/26/2011   Thank you,  Genene Churn, [place]  [facility] 01/16/2020, 9:46 AM  [place] [address] [place], [place], [address] Phone: [phone]   Fax:  [phone]  Name: CLARIE CAMEY MRN: [id number] Date of Birth: 02-Apr-1941

## 2020-01-16 NOTE — Telephone Encounter (Signed)
New Message:    Pt's daughter called and wantse to know if she can come in with pt tomorrow for her wound check please?

## 2020-01-17 ENCOUNTER — Ambulatory Visit (INDEPENDENT_AMBULATORY_CARE_PROVIDER_SITE_OTHER): Payer: Medicare HMO | Admitting: Emergency Medicine

## 2020-01-17 DIAGNOSIS — I639 Cerebral infarction, unspecified: Secondary | ICD-10-CM

## 2020-01-17 NOTE — Progress Notes (Signed)
ILR wound check in clinic. Steri strips removed prior to visit. Wound well healed. R-waves 0.13 mV. Home monitor transmitting nightly. 1 AF episode with EGM that appears to be SR with PAC's. Questions answered.

## 2020-01-18 DIAGNOSIS — I10 Essential (primary) hypertension: Secondary | ICD-10-CM | POA: Diagnosis not present

## 2020-01-18 DIAGNOSIS — I679 Cerebrovascular disease, unspecified: Secondary | ICD-10-CM | POA: Diagnosis not present

## 2020-01-18 DIAGNOSIS — Z8616 Personal history of COVID-19: Secondary | ICD-10-CM | POA: Diagnosis not present

## 2020-01-18 DIAGNOSIS — E7800 Pure hypercholesterolemia, unspecified: Secondary | ICD-10-CM | POA: Diagnosis not present

## 2020-01-18 DIAGNOSIS — K259 Gastric ulcer, unspecified as acute or chronic, without hemorrhage or perforation: Secondary | ICD-10-CM | POA: Diagnosis not present

## 2020-02-06 ENCOUNTER — Ambulatory Visit (INDEPENDENT_AMBULATORY_CARE_PROVIDER_SITE_OTHER): Payer: Medicare HMO | Admitting: *Deleted

## 2020-02-06 DIAGNOSIS — I639 Cerebral infarction, unspecified: Secondary | ICD-10-CM | POA: Diagnosis not present

## 2020-02-07 DIAGNOSIS — H5050 Unspecified heterophoria: Secondary | ICD-10-CM | POA: Diagnosis not present

## 2020-02-07 LAB — CUP PACEART REMOTE DEVICE CHECK
Date Time Interrogation Session: 20210712151102
Implantable Pulse Generator Implant Date: 20210609

## 2020-02-07 NOTE — Progress Notes (Signed)
Carelink Summary Report / Loop Recorder 

## 2020-02-08 ENCOUNTER — Inpatient Hospital Stay: Payer: Medicare HMO | Admitting: Adult Health

## 2020-02-09 DIAGNOSIS — I679 Cerebrovascular disease, unspecified: Secondary | ICD-10-CM | POA: Diagnosis not present

## 2020-02-09 DIAGNOSIS — I10 Essential (primary) hypertension: Secondary | ICD-10-CM | POA: Diagnosis not present

## 2020-02-21 DIAGNOSIS — I10 Essential (primary) hypertension: Secondary | ICD-10-CM | POA: Diagnosis not present

## 2020-02-21 DIAGNOSIS — I679 Cerebrovascular disease, unspecified: Secondary | ICD-10-CM | POA: Diagnosis not present

## 2020-03-05 ENCOUNTER — Encounter: Payer: Self-pay | Admitting: Neurology

## 2020-03-05 ENCOUNTER — Ambulatory Visit: Payer: Medicare HMO | Admitting: Neurology

## 2020-03-05 VITALS — BP 110/70 | HR 97 | Ht 64.0 in | Wt 116.0 lb

## 2020-03-05 DIAGNOSIS — I639 Cerebral infarction, unspecified: Secondary | ICD-10-CM | POA: Diagnosis not present

## 2020-03-05 NOTE — Patient Instructions (Signed)
I had a long d/w patient and her daughter about her recent cryptogenic stroke, risk for recurrent stroke/TIAs, personally independently reviewed imaging studies and stroke evaluation results and answered questions.Continue Plavix 75 mg daily for secondary stroke prevention and maintain strict control of hypertension with blood pressure goal below 130/90, diabetes with hemoglobin A1c goal below 6.5% and lipids with LDL cholesterol goal below 70 mg/dL. I also advised the patient to eat a healthy diet with plenty of whole grains, cereals, fruits and vegetables, exercise regularly and maintain ideal body weight. Followup in the future with my nurse practitioner Janett Billow in 6 months or call earlier if necessary.   Stroke Prevention Some medical conditions and behaviors are associated with a higher chance of having a stroke. You can help prevent a stroke by making nutrition, lifestyle, and other changes, including managing any medical conditions you may have. What nutrition changes can be made?   Eat healthy foods. You can do this by: ? Choosing foods high in fiber, such as fresh fruits and vegetables and whole grains. ? Eating at least 5 or more servings of fruits and vegetables a day. Try to fill half of your plate at each meal with fruits and vegetables. ? Choosing lean protein foods, such as lean cuts of meat, poultry without skin, fish, tofu, beans, and nuts. ? Eating low-fat dairy products. ? Avoiding foods that are high in salt (sodium). This can help lower blood pressure. ? Avoiding foods that have saturated fat, trans fat, and cholesterol. This can help prevent high cholesterol. ? Avoiding processed and premade foods.  Follow your health care provider's specific guidelines for losing weight, controlling high blood pressure (hypertension), lowering high cholesterol, and managing diabetes. These may include: ? Reducing your daily calorie intake. ? Limiting your daily sodium intake to 1,500  milligrams (mg). ? Using only healthy fats for cooking, such as olive oil, canola oil, or sunflower oil. ? Counting your daily carbohydrate intake. What lifestyle changes can be made?  Maintain a healthy weight. Talk to your health care provider about your ideal weight.  Get at least 30 minutes of moderate physical activity at least 5 days a week. Moderate activity includes brisk walking, biking, and swimming.  Do not use any products that contain nicotine or tobacco, such as cigarettes and e-cigarettes. If you need help quitting, ask your health care provider. It may also be helpful to avoid exposure to secondhand smoke.  Limit alcohol intake to no more than 1 drink a day for nonpregnant women and 2 drinks a day for men. One drink equals 12 oz of beer, 5 oz of wine, or 1 oz of hard liquor.  Stop any illegal drug use.  Avoid taking birth control pills. Talk to your health care provider about the risks of taking birth control pills if: ? You are over 37 years old. ? You smoke. ? You get migraines. ? You have ever had a blood clot. What other changes can be made?  Manage your cholesterol levels. ? Eating a healthy diet is important for preventing high cholesterol. If cholesterol cannot be managed through diet alone, you may also need to take medicines. ? Take any prescribed medicines to control your cholesterol as told by your health care provider.  Manage your diabetes. ? Eating a healthy diet and exercising regularly are important parts of managing your blood sugar. If your blood sugar cannot be managed through diet and exercise, you may need to take medicines. ? Take any prescribed medicines to  control your diabetes as told by your health care provider.  Control your hypertension. ? To reduce your risk of stroke, try to keep your blood pressure below 130/80. ? Eating a healthy diet and exercising regularly are an important part of controlling your blood pressure. If your blood  pressure cannot be managed through diet and exercise, you may need to take medicines. ? Take any prescribed medicines to control hypertension as told by your health care provider. ? Ask your health care provider if you should monitor your blood pressure at home. ? Have your blood pressure checked every year, even if your blood pressure is normal. Blood pressure increases with age and some medical conditions.  Get evaluated for sleep disorders (sleep apnea). Talk to your health care provider about getting a sleep evaluation if you snore a lot or have excessive sleepiness.  Take over-the-counter and prescription medicines only as told by your health care provider. Aspirin or blood thinners (antiplatelets or anticoagulants) may be recommended to reduce your risk of forming blood clots that can lead to stroke.  Make sure that any other medical conditions you have, such as atrial fibrillation or atherosclerosis, are managed. What are the warning signs of a stroke? The warning signs of a stroke can be easily remembered as BEFAST.  B is for balance. Signs include: ? Dizziness. ? Loss of balance or coordination. ? Sudden trouble walking.  E is for eyes. Signs include: ? A sudden change in vision. ? Trouble seeing.  F is for face. Signs include: ? Sudden weakness or numbness of the face. ? The face or eyelid drooping to one side.  A is for arms. Signs include: ? Sudden weakness or numbness of the arm, usually on one side of the body.  S is for speech. Signs include: ? Trouble speaking (aphasia). ? Trouble understanding.  T is for time. ? These symptoms may represent a serious problem that is an emergency. Do not wait to see if the symptoms will go away. Get medical help right away. Call your local emergency services (911 in the [place] Do not drive yourself to the hospital.  Other signs of stroke may include: ? A sudden, severe headache with no known cause. ? Nausea or  vomiting. ? Seizure. Where to find more information For more information, visit:  American Stroke Association: www.strokeassociation.org  National Stroke Association: www.stroke.org Summary  You can prevent a stroke by eating healthy, exercising, not smoking, limiting alcohol intake, and managing any medical conditions you may have.  Do not use any products that contain nicotine or tobacco, such as cigarettes and e-cigarettes. If you need help quitting, ask your health care provider. It may also be helpful to avoid exposure to secondhand smoke.  Remember BEFAST for warning signs of stroke. Get help right away if you or a loved one has any of these signs. This information is not intended to replace advice given to you by your health care provider. Make sure you discuss any questions you have with your health care provider. Document Revised: 06/26/2017 Document Reviewed: 08/19/2016 Elsevier Patient Education  2020 [employer].

## 2020-03-05 NOTE — Progress Notes (Signed)
[facility] [address] [place]. [place] [address] [phone]       OFFICE FOLLOW-UP NOTE  Doris Ford Date of Birth:  April 17, 1941 Medical Record Number:  [id number]   HPI: Doris Ford is a pleasant 79 year old Caucasian lady seen today for initial office follow-up visit following hospital consultation for stroke in June 2021.  She is accompanied by her daughter.  History is obtained from them, review of electronic medical records and I personally reviewed imaging films in PACS. She has past medical history of hypertension, GI hemorrhage, osteopenia and COVID-19 infection.  She developed sudden onset of facial droop and weakness difficulty drinking water drooling from the corner of the mouth and dysarthria.  She presented outside time window for TPA.  CT scan of the head on admission showed no acute abnormalities.  MRI scan of the brain showed small right MCA territory infarcts involving right frontal operculum, insula and right parietal region.  There was tiny remote age right cerebellar infarct also noted.  CT angiogramof the brain and neck showed only mild extracranial left carotid stenosis.  2D echo showed normal ejection fraction without cardiac source of embolism.  Lower extremity Dopplers were negative for DVT.  LDL cholesterol was elevated at 1 1 5  mg percent and hemoglobin A1c was 5.9.  She had a loop recorder inserted and so for paroxysmal A. fib has not been found.  She was not discharged on aspirin Plavix and stop aspirin is currently on Plavix which is tolerating well with only minor bruising.  She is also on Lipitor which is tolerating well without muscle aches and pains.  Blood pressures well controlled today it is 110/70.  She states that her facial weakness, dysarthria and swallowing difficulties have all resolved completely and she has no residual deficits from the stroke.  She however feels quite anxious and edgy and is at times has been sharp with people  which is new for her.  She denies any prior known history of strokes prior to this event. ROS:   14 system review of systems is positive for speech difficulties, double vision and all other systems negative PMH:  Past Medical History:  Diagnosis Date  . Arthritis   . Cancer ([place]) 12/07   right breast/tx with lumpectomy and tamoxifen  . COVID-19   . GI hemorrhage   . Hypertension    under control  . Interstitial cystitis   . Osteopenia    no change    Social History:  Social History   Socioeconomic History  . Marital status: Married    Spouse name: Not on file  . Number of children: Not on file  . Years of education: Not on file  . Highest education level: Not on file  Occupational History  . Not on file  Tobacco Use  . Smoking status: Never Smoker  . Smokeless tobacco: Never Used  Vaping Use  . Vaping Use: Never used  Substance and Sexual Activity  . Alcohol use: No  . Drug use: No  . Sexual activity: Not Currently    Partners: Male    Birth control/protection: Surgical    Comment: [facility]  Other Topics Concern  . Not on file  Social History Narrative  . Not on file   Social Determinants of Health   Financial Resource Strain:   . Difficulty of Paying Living Expenses:   Food Insecurity:   . Worried About Charity fundraiser in the Last Year:   . [employer]  of Food in the Last Year:   Transportation Needs:   . Film/video editor (Medical):   [name] Lack of Transportation (Non-Medical):   Physical Activity:   . Days of Exercise per Week:   . Minutes of Exercise per Session:   Stress:   . Feeling of Stress :   Social Connections:   . Frequency of Communication with Friends and Family:   . Frequency of Social Gatherings with Friends and Family:   . Attends Religious Services:   . Active Member of Clubs or Organizations:   . Attends Archivist Meetings:   [name] Marital Status:   Intimate Partner Violence:   . Fear of Current or Ex-Partner:   . Emotionally  Abused:   [name] Physically Abused:   . Sexually Abused:     Medications:   Current Outpatient Medications on File Prior to Visit  Medication Sig Dispense Refill  . acetaminophen (TYLENOL) 650 MG CR tablet Take 650-1,300 mg by mouth 2 (two) times daily as needed for pain.    . Ascorbic Acid (VITAMIN C) 1000 MG tablet Take 1,000 mg by mouth daily.    [name] atorvastatin (LIPITOR) 40 MG tablet Take 1 tablet (40 mg total) by mouth daily. 30 tablet 1  . beta carotene w/minerals (OCUVITE) tablet Take 1 tablet by mouth daily.    . Calcium-Vitamin D (CALTRATE 600 PLUS-VIT D PO) Take 1 tablet by mouth daily.     . carboxymethylcellul-glycerin (OPTIVE) 0.5-0.9 % ophthalmic solution Place 1 drop into both eyes daily as needed for dry eyes.    . Cholecalciferol (VITAMIN D3) 50 MCG (2000 UT) TABS Take 2,000 Units by mouth daily.    . clopidogrel (PLAVIX) 75 MG tablet Take 1 tablet (75 mg total) by mouth daily. 30 tablet 1  . docusate sodium (STOOL SOFTENER) 100 MG capsule Take 100 mg by mouth daily as needed (constipation.).     [name] hydrochlorothiazide (HYDRODIURIL) 12.5 MG tablet Take 12.5 mg by mouth daily.    [name] loratadine (CLARITIN) 10 MG tablet Take 10 mg by mouth daily.    . NON FORMULARY Take 1 capsule by mouth daily. Cysta-Q    . polyethylene glycol (MIRALAX / GLYCOLAX) 17 g packet Take 17 g by mouth daily as needed for mild constipation. 14 each 0  . quinapril (ACCUPRIL) 20 MG tablet Take 1 tablet (20 mg total) by mouth daily.     No current facility-administered medications on file prior to visit.    Allergies:   Allergies  Allergen Reactions  . Codeine     Severe nausea.  Dizziness.  Santiago Bur [Nitrofurantoin] Other (See Comments)    Skin eruption    Physical Exam General: Frail elderly Caucasian lady, seated, in no evident distress Head: head normocephalic and atraumatic.  Neck: supple with no carotid or supraclavicular bruits Cardiovascular: regular rate and rhythm, no  murmurs Musculoskeletal: no deformity Skin:  no rash/petichiae Vascular:  Normal pulses all extremities Vitals:   03/05/20 0851  BP: 110/70  Pulse: 97  SpO2: 96%   Neurologic Exam Mental Status: Awake and fully alert. Oriented to place and time. Recent and remote memory intact. Attention span, concentration and fund of knowledge appropriate. Mood and affect appropriate.  Cranial Nerves: Fundoscopic exam reveals sharp disc margins. Pupils equal, briskly reactive to light. Extraocular movements full without nystagmus. Visual fields full to confrontation. Hearing slightly diminished bilaterally.. Facial sensation intact. Face, tongue, palate moves normally and symmetrically.  Motor: Normal bulk and tone. Normal strength  in all tested extremity muscles. Sensory.: intact to touch ,pinprick .position and vibratory sensation.  Coordination: Rapid alternating movements normal in all extremities. Finger-to-nose and heel-to-shin performed accurately bilaterally. Gait and Station: Arises from chair without difficulty. Stance is normal. Gait demonstrates normal stride length and balance . Able to heel, toe and tandem walk with moderatet difficulty.  Reflexes: 1+ and symmetric. Toes downgoing.   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     PLAN: I had a long d/w patient and her daughter about her recent cryptogenic stroke, risk for recurrent stroke/TIAs, personally independently reviewed imaging studies and stroke evaluation results and answered questions.Continue Plavix 75 mg daily for secondary stroke prevention and maintain strict control of hypertension with blood pressure goal below 130/90, diabetes with hemoglobin A1c goal below 6.5% and lipids with LDL cholesterol goal below 70 mg/dL. I also  advised the patient to eat a healthy diet with plenty of whole grains, cereals, fruits and vegetables, exercise regularly and maintain ideal body weight. Followup in the future with my nurse practitioner Janett Billow in 6 months or call earlier if necessary. Greater than 50% of time during this 30 minute visit was spent on counseling,explanation of diagnosis of cryptogenic stroke, planning of further management, discussion with patient and family and coordination of care Antony Contras, MD  [facility] [address] [place], [place] [address]  Phone [phone] Fax [phone] Note: This document was prepared with digital dictation and possible smart phrase technology. Any transcriptional errors that result from this process are unintentional

## 2020-03-06 DIAGNOSIS — H40003 Preglaucoma, unspecified, bilateral: Secondary | ICD-10-CM | POA: Diagnosis not present

## 2020-03-12 ENCOUNTER — Ambulatory Visit (INDEPENDENT_AMBULATORY_CARE_PROVIDER_SITE_OTHER): Payer: Medicare HMO | Admitting: *Deleted

## 2020-03-12 DIAGNOSIS — I639 Cerebral infarction, unspecified: Secondary | ICD-10-CM | POA: Diagnosis not present

## 2020-03-12 LAB — CUP PACEART REMOTE DEVICE CHECK
Date Time Interrogation Session: 20210816090527
Implantable Pulse Generator Implant Date: 20210609

## 2020-03-14 NOTE — Progress Notes (Signed)
Carelink Summary Report / Loop Recorder 

## 2020-04-03 DIAGNOSIS — I10 Essential (primary) hypertension: Secondary | ICD-10-CM | POA: Diagnosis not present

## 2020-04-16 ENCOUNTER — Ambulatory Visit (INDEPENDENT_AMBULATORY_CARE_PROVIDER_SITE_OTHER): Payer: Medicare HMO | Admitting: *Deleted

## 2020-04-16 DIAGNOSIS — I639 Cerebral infarction, unspecified: Secondary | ICD-10-CM | POA: Diagnosis not present

## 2020-04-17 LAB — CUP PACEART REMOTE DEVICE CHECK
Date Time Interrogation Session: 20210916151906
Implantable Pulse Generator Implant Date: 20210609

## 2020-04-18 NOTE — Progress Notes (Signed)
Carelink Summary Report / Loop Recorder 

## 2020-05-10 DIAGNOSIS — H16213 Exposure keratoconjunctivitis, bilateral: Secondary | ICD-10-CM | POA: Diagnosis not present

## 2020-05-10 DIAGNOSIS — H04123 Dry eye syndrome of bilateral lacrimal glands: Secondary | ICD-10-CM | POA: Diagnosis not present

## 2020-05-10 DIAGNOSIS — H02831 Dermatochalasis of right upper eyelid: Secondary | ICD-10-CM | POA: Diagnosis not present

## 2020-05-10 DIAGNOSIS — H40013 Open angle with borderline findings, low risk, bilateral: Secondary | ICD-10-CM | POA: Diagnosis not present

## 2020-05-10 DIAGNOSIS — H02834 Dermatochalasis of left upper eyelid: Secondary | ICD-10-CM | POA: Diagnosis not present

## 2020-05-11 DIAGNOSIS — R69 Illness, unspecified: Secondary | ICD-10-CM | POA: Diagnosis not present

## 2020-05-15 LAB — CUP PACEART REMOTE DEVICE CHECK
Date Time Interrogation Session: 20211019152118
Implantable Pulse Generator Implant Date: 20210609

## 2020-05-21 ENCOUNTER — Ambulatory Visit (INDEPENDENT_AMBULATORY_CARE_PROVIDER_SITE_OTHER): Payer: Medicare HMO

## 2020-05-21 DIAGNOSIS — I639 Cerebral infarction, unspecified: Secondary | ICD-10-CM | POA: Diagnosis not present

## 2020-05-24 NOTE — Progress Notes (Signed)
Carelink Summary Report / Loop Recorder 

## 2020-06-20 DIAGNOSIS — M9905 Segmental and somatic dysfunction of pelvic region: Secondary | ICD-10-CM | POA: Diagnosis not present

## 2020-06-20 DIAGNOSIS — M9903 Segmental and somatic dysfunction of lumbar region: Secondary | ICD-10-CM | POA: Diagnosis not present

## 2020-06-20 DIAGNOSIS — M5136 Other intervertebral disc degeneration, lumbar region: Secondary | ICD-10-CM | POA: Diagnosis not present

## 2020-06-20 DIAGNOSIS — M546 Pain in thoracic spine: Secondary | ICD-10-CM | POA: Diagnosis not present

## 2020-06-20 DIAGNOSIS — M9902 Segmental and somatic dysfunction of thoracic region: Secondary | ICD-10-CM | POA: Diagnosis not present

## 2020-06-22 LAB — CUP PACEART REMOTE DEVICE CHECK
Date Time Interrogation Session: 20211121142139
Implantable Pulse Generator Implant Date: 20210609

## 2020-06-25 ENCOUNTER — Telehealth: Payer: Self-pay | Admitting: Cardiology

## 2020-06-25 ENCOUNTER — Ambulatory Visit (INDEPENDENT_AMBULATORY_CARE_PROVIDER_SITE_OTHER): Payer: Medicare HMO

## 2020-06-25 DIAGNOSIS — M9902 Segmental and somatic dysfunction of thoracic region: Secondary | ICD-10-CM | POA: Diagnosis not present

## 2020-06-25 DIAGNOSIS — I639 Cerebral infarction, unspecified: Secondary | ICD-10-CM | POA: Diagnosis not present

## 2020-06-25 DIAGNOSIS — M546 Pain in thoracic spine: Secondary | ICD-10-CM | POA: Diagnosis not present

## 2020-06-25 DIAGNOSIS — M5136 Other intervertebral disc degeneration, lumbar region: Secondary | ICD-10-CM | POA: Diagnosis not present

## 2020-06-25 DIAGNOSIS — M9905 Segmental and somatic dysfunction of pelvic region: Secondary | ICD-10-CM | POA: Diagnosis not present

## 2020-06-25 DIAGNOSIS — M9903 Segmental and somatic dysfunction of lumbar region: Secondary | ICD-10-CM | POA: Diagnosis not present

## 2020-06-25 NOTE — Telephone Encounter (Signed)
° ° °  Pt called wanted to asked if she can use TENS electrodes, called device clinic, spoke with Amy and was told it is ok for pt to use TENS electrodes while on loop recorder, she advised for pt to record date and time when using TENS electrodes. Pt understood.

## 2020-06-29 DIAGNOSIS — M546 Pain in thoracic spine: Secondary | ICD-10-CM | POA: Diagnosis not present

## 2020-06-29 DIAGNOSIS — M9902 Segmental and somatic dysfunction of thoracic region: Secondary | ICD-10-CM | POA: Diagnosis not present

## 2020-06-29 DIAGNOSIS — M9901 Segmental and somatic dysfunction of cervical region: Secondary | ICD-10-CM | POA: Diagnosis not present

## 2020-06-29 DIAGNOSIS — M5136 Other intervertebral disc degeneration, lumbar region: Secondary | ICD-10-CM | POA: Diagnosis not present

## 2020-06-29 DIAGNOSIS — M9905 Segmental and somatic dysfunction of pelvic region: Secondary | ICD-10-CM | POA: Diagnosis not present

## 2020-06-29 DIAGNOSIS — M9903 Segmental and somatic dysfunction of lumbar region: Secondary | ICD-10-CM | POA: Diagnosis not present

## 2020-06-29 DIAGNOSIS — M542 Cervicalgia: Secondary | ICD-10-CM | POA: Diagnosis not present

## 2020-07-02 DIAGNOSIS — M542 Cervicalgia: Secondary | ICD-10-CM | POA: Diagnosis not present

## 2020-07-02 DIAGNOSIS — M9901 Segmental and somatic dysfunction of cervical region: Secondary | ICD-10-CM | POA: Diagnosis not present

## 2020-07-02 DIAGNOSIS — M546 Pain in thoracic spine: Secondary | ICD-10-CM | POA: Diagnosis not present

## 2020-07-02 DIAGNOSIS — M9903 Segmental and somatic dysfunction of lumbar region: Secondary | ICD-10-CM | POA: Diagnosis not present

## 2020-07-02 DIAGNOSIS — M9902 Segmental and somatic dysfunction of thoracic region: Secondary | ICD-10-CM | POA: Diagnosis not present

## 2020-07-02 DIAGNOSIS — M9905 Segmental and somatic dysfunction of pelvic region: Secondary | ICD-10-CM | POA: Diagnosis not present

## 2020-07-02 DIAGNOSIS — M5136 Other intervertebral disc degeneration, lumbar region: Secondary | ICD-10-CM | POA: Diagnosis not present

## 2020-07-02 NOTE — Progress Notes (Signed)
Carelink Summary Report / Loop Recorder 

## 2020-07-06 DIAGNOSIS — M9905 Segmental and somatic dysfunction of pelvic region: Secondary | ICD-10-CM | POA: Diagnosis not present

## 2020-07-06 DIAGNOSIS — M5136 Other intervertebral disc degeneration, lumbar region: Secondary | ICD-10-CM | POA: Diagnosis not present

## 2020-07-06 DIAGNOSIS — M9903 Segmental and somatic dysfunction of lumbar region: Secondary | ICD-10-CM | POA: Diagnosis not present

## 2020-07-06 DIAGNOSIS — M546 Pain in thoracic spine: Secondary | ICD-10-CM | POA: Diagnosis not present

## 2020-07-06 DIAGNOSIS — M9902 Segmental and somatic dysfunction of thoracic region: Secondary | ICD-10-CM | POA: Diagnosis not present

## 2020-07-06 DIAGNOSIS — M542 Cervicalgia: Secondary | ICD-10-CM | POA: Diagnosis not present

## 2020-07-06 DIAGNOSIS — M9901 Segmental and somatic dysfunction of cervical region: Secondary | ICD-10-CM | POA: Diagnosis not present

## 2020-07-09 DIAGNOSIS — M9903 Segmental and somatic dysfunction of lumbar region: Secondary | ICD-10-CM | POA: Diagnosis not present

## 2020-07-09 DIAGNOSIS — M9905 Segmental and somatic dysfunction of pelvic region: Secondary | ICD-10-CM | POA: Diagnosis not present

## 2020-07-09 DIAGNOSIS — M542 Cervicalgia: Secondary | ICD-10-CM | POA: Diagnosis not present

## 2020-07-09 DIAGNOSIS — M9901 Segmental and somatic dysfunction of cervical region: Secondary | ICD-10-CM | POA: Diagnosis not present

## 2020-07-09 DIAGNOSIS — M9902 Segmental and somatic dysfunction of thoracic region: Secondary | ICD-10-CM | POA: Diagnosis not present

## 2020-07-09 DIAGNOSIS — M546 Pain in thoracic spine: Secondary | ICD-10-CM | POA: Diagnosis not present

## 2020-07-09 DIAGNOSIS — M5136 Other intervertebral disc degeneration, lumbar region: Secondary | ICD-10-CM | POA: Diagnosis not present

## 2020-07-13 DIAGNOSIS — M5136 Other intervertebral disc degeneration, lumbar region: Secondary | ICD-10-CM | POA: Diagnosis not present

## 2020-07-13 DIAGNOSIS — M542 Cervicalgia: Secondary | ICD-10-CM | POA: Diagnosis not present

## 2020-07-13 DIAGNOSIS — M546 Pain in thoracic spine: Secondary | ICD-10-CM | POA: Diagnosis not present

## 2020-07-13 DIAGNOSIS — M9902 Segmental and somatic dysfunction of thoracic region: Secondary | ICD-10-CM | POA: Diagnosis not present

## 2020-07-13 DIAGNOSIS — M9905 Segmental and somatic dysfunction of pelvic region: Secondary | ICD-10-CM | POA: Diagnosis not present

## 2020-07-13 DIAGNOSIS — M9901 Segmental and somatic dysfunction of cervical region: Secondary | ICD-10-CM | POA: Diagnosis not present

## 2020-07-13 DIAGNOSIS — M9903 Segmental and somatic dysfunction of lumbar region: Secondary | ICD-10-CM | POA: Diagnosis not present

## 2020-07-17 DIAGNOSIS — K259 Gastric ulcer, unspecified as acute or chronic, without hemorrhage or perforation: Secondary | ICD-10-CM | POA: Diagnosis not present

## 2020-07-17 DIAGNOSIS — R5381 Other malaise: Secondary | ICD-10-CM | POA: Diagnosis not present

## 2020-07-17 DIAGNOSIS — M419 Scoliosis, unspecified: Secondary | ICD-10-CM | POA: Diagnosis not present

## 2020-07-17 DIAGNOSIS — I679 Cerebrovascular disease, unspecified: Secondary | ICD-10-CM | POA: Diagnosis not present

## 2020-07-17 DIAGNOSIS — M1990 Unspecified osteoarthritis, unspecified site: Secondary | ICD-10-CM | POA: Diagnosis not present

## 2020-07-17 DIAGNOSIS — R202 Paresthesia of skin: Secondary | ICD-10-CM | POA: Diagnosis not present

## 2020-07-17 DIAGNOSIS — Z8616 Personal history of COVID-19: Secondary | ICD-10-CM | POA: Diagnosis not present

## 2020-07-17 DIAGNOSIS — M8580 Other specified disorders of bone density and structure, unspecified site: Secondary | ICD-10-CM | POA: Diagnosis not present

## 2020-07-17 DIAGNOSIS — M545 Low back pain, unspecified: Secondary | ICD-10-CM | POA: Diagnosis not present

## 2020-07-17 DIAGNOSIS — I10 Essential (primary) hypertension: Secondary | ICD-10-CM | POA: Diagnosis not present

## 2020-07-17 DIAGNOSIS — N3010 Interstitial cystitis (chronic) without hematuria: Secondary | ICD-10-CM | POA: Diagnosis not present

## 2020-07-17 DIAGNOSIS — E7800 Pure hypercholesterolemia, unspecified: Secondary | ICD-10-CM | POA: Diagnosis not present

## 2020-07-18 DIAGNOSIS — M9902 Segmental and somatic dysfunction of thoracic region: Secondary | ICD-10-CM | POA: Diagnosis not present

## 2020-07-18 DIAGNOSIS — M5136 Other intervertebral disc degeneration, lumbar region: Secondary | ICD-10-CM | POA: Diagnosis not present

## 2020-07-18 DIAGNOSIS — M9903 Segmental and somatic dysfunction of lumbar region: Secondary | ICD-10-CM | POA: Diagnosis not present

## 2020-07-18 DIAGNOSIS — M9905 Segmental and somatic dysfunction of pelvic region: Secondary | ICD-10-CM | POA: Diagnosis not present

## 2020-07-18 DIAGNOSIS — M546 Pain in thoracic spine: Secondary | ICD-10-CM | POA: Diagnosis not present

## 2020-07-18 DIAGNOSIS — M9901 Segmental and somatic dysfunction of cervical region: Secondary | ICD-10-CM | POA: Diagnosis not present

## 2020-07-18 DIAGNOSIS — M542 Cervicalgia: Secondary | ICD-10-CM | POA: Diagnosis not present

## 2020-07-25 DIAGNOSIS — M5136 Other intervertebral disc degeneration, lumbar region: Secondary | ICD-10-CM | POA: Diagnosis not present

## 2020-07-25 DIAGNOSIS — M9905 Segmental and somatic dysfunction of pelvic region: Secondary | ICD-10-CM | POA: Diagnosis not present

## 2020-07-25 DIAGNOSIS — M9901 Segmental and somatic dysfunction of cervical region: Secondary | ICD-10-CM | POA: Diagnosis not present

## 2020-07-25 DIAGNOSIS — M542 Cervicalgia: Secondary | ICD-10-CM | POA: Diagnosis not present

## 2020-07-25 DIAGNOSIS — M9903 Segmental and somatic dysfunction of lumbar region: Secondary | ICD-10-CM | POA: Diagnosis not present

## 2020-07-25 DIAGNOSIS — M546 Pain in thoracic spine: Secondary | ICD-10-CM | POA: Diagnosis not present

## 2020-07-25 DIAGNOSIS — M9902 Segmental and somatic dysfunction of thoracic region: Secondary | ICD-10-CM | POA: Diagnosis not present

## 2020-07-30 ENCOUNTER — Ambulatory Visit (INDEPENDENT_AMBULATORY_CARE_PROVIDER_SITE_OTHER): Payer: Medicare HMO

## 2020-07-30 DIAGNOSIS — I639 Cerebral infarction, unspecified: Secondary | ICD-10-CM

## 2020-07-31 LAB — CUP PACEART REMOTE DEVICE CHECK
Date Time Interrogation Session: 20220101233545
Implantable Pulse Generator Implant Date: 20210609

## 2020-08-08 DIAGNOSIS — M9901 Segmental and somatic dysfunction of cervical region: Secondary | ICD-10-CM | POA: Diagnosis not present

## 2020-08-08 DIAGNOSIS — M9903 Segmental and somatic dysfunction of lumbar region: Secondary | ICD-10-CM | POA: Diagnosis not present

## 2020-08-08 DIAGNOSIS — M9902 Segmental and somatic dysfunction of thoracic region: Secondary | ICD-10-CM | POA: Diagnosis not present

## 2020-08-08 DIAGNOSIS — M542 Cervicalgia: Secondary | ICD-10-CM | POA: Diagnosis not present

## 2020-08-08 DIAGNOSIS — M546 Pain in thoracic spine: Secondary | ICD-10-CM | POA: Diagnosis not present

## 2020-08-08 DIAGNOSIS — M9905 Segmental and somatic dysfunction of pelvic region: Secondary | ICD-10-CM | POA: Diagnosis not present

## 2020-08-08 DIAGNOSIS — M5136 Other intervertebral disc degeneration, lumbar region: Secondary | ICD-10-CM | POA: Diagnosis not present

## 2020-08-14 NOTE — Progress Notes (Signed)
Carelink Summary Report / Loop Recorder 

## 2020-08-15 DIAGNOSIS — M546 Pain in thoracic spine: Secondary | ICD-10-CM | POA: Diagnosis not present

## 2020-08-15 DIAGNOSIS — M9905 Segmental and somatic dysfunction of pelvic region: Secondary | ICD-10-CM | POA: Diagnosis not present

## 2020-08-15 DIAGNOSIS — M5136 Other intervertebral disc degeneration, lumbar region: Secondary | ICD-10-CM | POA: Diagnosis not present

## 2020-08-15 DIAGNOSIS — M9903 Segmental and somatic dysfunction of lumbar region: Secondary | ICD-10-CM | POA: Diagnosis not present

## 2020-08-15 DIAGNOSIS — M542 Cervicalgia: Secondary | ICD-10-CM | POA: Diagnosis not present

## 2020-08-15 DIAGNOSIS — M9901 Segmental and somatic dysfunction of cervical region: Secondary | ICD-10-CM | POA: Diagnosis not present

## 2020-08-15 DIAGNOSIS — M9902 Segmental and somatic dysfunction of thoracic region: Secondary | ICD-10-CM | POA: Diagnosis not present

## 2020-08-29 DIAGNOSIS — M9901 Segmental and somatic dysfunction of cervical region: Secondary | ICD-10-CM | POA: Diagnosis not present

## 2020-08-29 DIAGNOSIS — M546 Pain in thoracic spine: Secondary | ICD-10-CM | POA: Diagnosis not present

## 2020-08-29 DIAGNOSIS — M9902 Segmental and somatic dysfunction of thoracic region: Secondary | ICD-10-CM | POA: Diagnosis not present

## 2020-08-29 DIAGNOSIS — M9903 Segmental and somatic dysfunction of lumbar region: Secondary | ICD-10-CM | POA: Diagnosis not present

## 2020-08-29 DIAGNOSIS — M9905 Segmental and somatic dysfunction of pelvic region: Secondary | ICD-10-CM | POA: Diagnosis not present

## 2020-08-29 DIAGNOSIS — M542 Cervicalgia: Secondary | ICD-10-CM | POA: Diagnosis not present

## 2020-08-29 DIAGNOSIS — M5136 Other intervertebral disc degeneration, lumbar region: Secondary | ICD-10-CM | POA: Diagnosis not present

## 2020-08-30 ENCOUNTER — Telehealth: Payer: Self-pay | Admitting: Cardiology

## 2020-08-30 NOTE — Telephone Encounter (Signed)
Called patient to advise there are no contraindications in a mammogram with her device in place. Left detailed message (DPR) gives permission. Advised to call back if she has further questions or concerns.

## 2020-08-30 NOTE — Telephone Encounter (Signed)
°  1. Has your device fired? no  2. Is you device beeping? no  3. Are you experiencing draining or swelling at device site? no  4. Are you calling to see if we received your device transmission? no  5. Have you passed out? no   Patient states she will be due for a mammogram soon and would like to know if she can still have that done. Please advise.  Please route to [place]

## 2020-08-31 LAB — CUP PACEART REMOTE DEVICE CHECK
Date Time Interrogation Session: 20220203233548
Implantable Pulse Generator Implant Date: 20210609

## 2020-09-03 ENCOUNTER — Ambulatory Visit (INDEPENDENT_AMBULATORY_CARE_PROVIDER_SITE_OTHER): Payer: Medicare HMO

## 2020-09-03 DIAGNOSIS — I639 Cerebral infarction, unspecified: Secondary | ICD-10-CM | POA: Diagnosis not present

## 2020-09-05 ENCOUNTER — Ambulatory Visit: Payer: Medicare HMO | Admitting: Adult Health

## 2020-09-05 ENCOUNTER — Encounter: Payer: Self-pay | Admitting: Adult Health

## 2020-09-05 VITALS — BP 142/74 | HR 83 | Ht 64.0 in | Wt 120.0 lb

## 2020-09-05 DIAGNOSIS — F0630 Mood disorder due to known physiological condition, unspecified: Secondary | ICD-10-CM

## 2020-09-05 DIAGNOSIS — I69398 Other sequelae of cerebral infarction: Secondary | ICD-10-CM | POA: Diagnosis not present

## 2020-09-05 DIAGNOSIS — R69 Illness, unspecified: Secondary | ICD-10-CM | POA: Diagnosis not present

## 2020-09-05 DIAGNOSIS — I639 Cerebral infarction, unspecified: Secondary | ICD-10-CM

## 2020-09-05 MED ORDER — SERTRALINE HCL 25 MG PO TABS: 25.0000 mg | ORAL_TABLET | Freq: Every day | ORAL | 6 refills | Status: DC

## 2020-09-05 NOTE — Progress Notes (Signed)
I agree with the above plan 

## 2020-09-05 NOTE — Progress Notes (Signed)
[facility] [address] [place]. [place] [address] [phone]       OFFICE FOLLOW-UP NOTE  [name] Date of Birth:  1941/07/27 Medical Record Number:  [id number]    Reason for visit: Stroke follow-up  Chief Complaint  Patient presents with  . Follow-up    RM 14 alone Pt is well, no symptoms       HPI:   Today, 09/05/2020, Doris Ford returns for 82-month stroke follow-up. She has been doing well since prior visit without new recurrent stroke/TIA symptoms. Denies residual focal deficits but does report continued fatigue, lack of energy and anxiety. She has been slowly increasing daily exercise hoping this would help with her fatigue but she continues to struggle on a daily basis. She has remained on atorvastatin and Plavix without side effects. Blood pressure today 142/74. Loop recorder has not shown evidence of atrial fibrillation thus far.  No further concerns at this time.   History provided for reference purposes only Initial visit 03/05/2020 Dr. [name]: Doris Ford is a pleasant 80 year old Caucasian lady seen today for initial office follow-up visit following hospital consultation for stroke in June 2021.  She is accompanied by her daughter.  History is obtained from them, review of electronic medical records and I personally reviewed imaging films in PACS. She has past medical history of hypertension, GI hemorrhage, osteopenia and COVID-19 infection.  She developed sudden onset of facial droop and weakness difficulty drinking water drooling from the corner of the mouth and dysarthria.  She presented outside time window for TPA.  CT scan of the head on admission showed no acute abnormalities.  MRI scan of the brain showed small right MCA territory infarcts involving right frontal operculum, insula and right parietal region.  There was tiny remote age right cerebellar infarct also noted.  CT angiogramof the brain and neck showed only mild extracranial left  carotid stenosis.  2D echo showed normal ejection fraction without cardiac source of embolism.  Lower extremity Dopplers were negative for DVT.  LDL cholesterol was elevated at 1 1 5  mg percent and hemoglobin A1c was 5.9.  She had a loop recorder inserted and so for paroxysmal A. fib has not been found.  She was not discharged on aspirin Plavix and stop aspirin is currently on Plavix which is tolerating well with only minor bruising.  She is also on Lipitor which is tolerating well without muscle aches and pains.  Blood pressures well controlled today it is 110/70.  She states that her facial weakness, dysarthria and swallowing difficulties have all resolved completely and she has no residual deficits from the stroke.  She however feels quite anxious and edgy and is at times has been sharp with people which is new for her.  She denies any prior known history of strokes prior to this event.    ROS:   14 system review of systems is positive for those listed in HPI and all other systems negative  Depression screen [facility] 2/9 09/05/2020  Decreased Interest 0  Down, Depressed, Hopeless 1  PHQ - 2 Score 1  Altered sleeping 2  Tired, decreased energy 3  Change in appetite 0  Feeling bad or failure about yourself  0  Trouble concentrating 2  Moving slowly or fidgety/restless 1  Suicidal thoughts 0  PHQ-9 Score 9    GAD 7 : Generalized Anxiety Score 09/05/2020  Nervous, Anxious, on Edge 1  Control/stop worrying 1  Worry too much - different things 1  Trouble relaxing 1  Restless 1  Easily annoyed or irritable 1  Afraid - awful might happen 1  Total GAD 7 Score 7  Anxiety Difficulty Somewhat difficult       PMH:  Past Medical History:  Diagnosis Date  . Arthritis   . Cancer ([place]) 12/07   right breast/tx with lumpectomy and tamoxifen  . COVID-19   . GI hemorrhage   . Hypertension    under control  . Interstitial cystitis   . Osteopenia    no change    Social History:  Social History    Socioeconomic History  . Marital status: Married    Spouse name: Not on file  . Number of children: Not on file  . Years of education: Not on file  . Highest education level: Not on file  Occupational History  . Not on file  Tobacco Use  . Smoking status: Never Smoker  . Smokeless tobacco: Never Used  Vaping Use  . Vaping Use: Never used  Substance and Sexual Activity  . Alcohol use: No  . Drug use: No  . Sexual activity: Not Currently    Partners: Male    Birth control/protection: Surgical    Comment: [facility]  Other Topics Concern  . Not on file  Social History Narrative  . Not on file   Social Determinants of Health   Financial Resource Strain: Not on file  Food Insecurity: Not on file  Transportation Needs: Not on file  Physical Activity: Not on file  Stress: Not on file  Social Connections: Not on file  Intimate Partner Violence: Not on file    Medications:   Current Outpatient Medications on File Prior to Visit  Medication Sig Dispense Refill  . acetaminophen (TYLENOL) 650 MG CR tablet Take 650-1,300 mg by mouth 2 (two) times daily as needed for pain.    . Ascorbic Acid (VITAMIN C) 1000 MG tablet Take 1,000 mg by mouth daily.    [name] atorvastatin (LIPITOR) 40 MG tablet Take 1 tablet (40 mg total) by mouth daily. 30 tablet 1  . beta carotene w/minerals (OCUVITE) tablet Take 1 tablet by mouth daily.    . Calcium-Vitamin D (CALTRATE 600 PLUS-VIT D PO) Take 1 tablet by mouth daily.     . carboxymethylcellul-glycerin (OPTIVE) 0.5-0.9 % ophthalmic solution Place 1 drop into both eyes daily as needed for dry eyes.    . Cholecalciferol (VITAMIN D3) 50 MCG (2000 UT) TABS Take 2,000 Units by mouth daily.    . clopidogrel (PLAVIX) 75 MG tablet Take 1 tablet (75 mg total) by mouth daily. 30 tablet 1  . docusate sodium (COLACE) 100 MG capsule Take 100 mg by mouth daily as needed (constipation.).     [name] hydrochlorothiazide (HYDRODIURIL) 12.5 MG tablet Take 12.5 mg by mouth daily.     [name] loratadine (CLARITIN) 10 MG tablet Take 10 mg by mouth daily.    . NON FORMULARY Take 1 capsule by mouth daily. Cysta-Q    . polyethylene glycol (MIRALAX / GLYCOLAX) 17 g packet Take 17 g by mouth daily as needed for mild constipation. 14 each 0  . quinapril (ACCUPRIL) 20 MG tablet Take 1 tablet (20 mg total) by mouth daily.     No current facility-administered medications on file prior to visit.    Allergies:   Allergies  Allergen Reactions  . Codeine     Severe nausea.  Dizziness.  Santiago Bur [Nitrofurantoin] Other (See Comments)    Skin eruption  Physical Exam Today's Vitals   09/05/20 1238  BP: (!) 142/74  Pulse: 83  Weight: 120 lb (54.4 kg)  Height: 5\' 4"  (1.626 m)   Body mass index is 20.6 kg/m.    General: Frail very pleasant elderly Caucasian lady, seated, in no evident distress Head: head normocephalic and atraumatic.  Neck: supple with no carotid or supraclavicular bruits Cardiovascular: regular rate and rhythm, no murmurs Musculoskeletal: no deformity Skin:  no rash/petichiae Vascular:  Normal pulses all extremities  Neurologic Exam Mental Status: Awake and fully alert.  Fluent speech and language.  Oriented to place and time. Recent and remote memory intact. Attention span, concentration and fund of knowledge appropriate. Mood and affect appropriate.  Cranial Nerves: Pupils equal, briskly reactive to light. Extraocular movements full without nystagmus. Visual fields full to confrontation. Hearing slightly diminished bilaterally.. Facial sensation intact. Face, tongue, palate moves normally and symmetrically.  Motor: Normal bulk and tone. Normal strength in all tested extremity muscles. Sensory.: intact to touch ,pinprick .position and vibratory sensation.  Coordination: Rapid alternating movements normal in all extremities. Finger-to-nose and heel-to-shin performed accurately bilaterally. Gait and Station: Arises from chair without difficulty. Stance is  normal. Gait demonstrates normal stride length and balance . Able to heel, toe and tandem walk with mild difficulty.  Reflexes: 1+ and symmetric. Toes downgoing.      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     R frontal MCA stroke, cryptogenic -Recovered well without residual focal deficits though has been experiencing increased anxiety and mood changes -Loop recorder has not shown atrial fibrillation thus far -Continue Plavix and atorvastatin 40 mg daily for secondary stroke prevention -Discussed secondary stroke prevention measures and importance of close PCP follow-up for aggressive stroke risk factor management -HTN: BP goal<130/90.  Satisfactory on hydrochlorothiazide and Accupril per PCP -HLD: LDL goal<70. 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 Marda Stalker, [name], MD   I spent 30 minutes of face-to-face and non-face-to-face time with patient.  This included previsit chart review, lab review, study review, order entry, electronic health record documentation, patient education and discussion regarding history of prior stroke, residual mood disorder post stroke and further treatment options, importance of managing stroke risk factors, monitoring of loop recorder, and answered all other questions to patient satisfaction  Frann Rider, AGNP-BC  [facility] [address] [place],  [address]  Phone [phone] Fax [phone] Note: This document was prepared with digital dictation and possible smart phrase technology. Any transcriptional errors that result from this process are unintentional.

## 2020-09-05 NOTE — Patient Instructions (Signed)
Recommend trialing sertaline 25mg  daily for post stroke anxiety  Continue clopidogrel 75 mg daily  and atorvastatin  for secondary stroke prevention  Continue to follow up with PCP regarding cholesterol and blood pressure management  Maintain strict control of hypertension with blood pressure goal below 130/90 and cholesterol with LDL cholesterol (bad cholesterol) goal below 70 mg/dL.      Followup in the future with me in 4 months or call earlier if needed      Thank you for coming to see [place] at [facility]. I hope we have been able to provide you high quality care today.  You may receive a patient satisfaction survey over the next few weeks. We would appreciate your feedback and comments so that we may continue to improve ourselves and the health of our patients.    Emotional Health After Stroke Your emotional health may change after a stroke. You may have fear, anxiety, anger, sadness, and other feelings. Some of these changes happen because a stroke can damage your brain and nervous system. You may also have these feelings because coping with a change in your health can feel overwhelming. Depression and other emotional changes can slow your recovery after a stroke. It is important to recognize the symptoms so that you can take steps to strengthen your emotional health. What are some common emotions after a stroke? You may have:  Fear.  Anxiety.  Anger.  Frustration.  Sadness.  Feelings of loss or grief.  Depression.  Crying or laughing at the wrong time or wrong situation (pseudobulbar affect, orPBA). What are the symptoms of depression? Depression after a stroke can happen right away, or it can show up later. Symptoms of depression may include:  Sleep problems.  Changes in normal eating habits, such as eating too much or too little.  Weight gain or weight loss.  Not having energy or enthusiasm (lethargy).  Feeling very tired  (fatigue).  Avoiding people and activities (social withdrawal).  Irritability.  Crying more than usual.  Mood swings.  Not being able to concentrate.  Feeling hopeless.  Hating yourself.  Having suicidal thoughts. If you ever feel like you may hurt yourself or others, or have thoughts about taking your own life, get help right away. You can go to your nearest emergency department or call:  Your local emergency services (911 in the [place]  A suicide crisis helpline, such as the [place] at 636-593-3008. This is open 24 hours a day. What increases my risk of depression? Having a stroke raises the risk of depression. The risk also goes up if you:  Are socially isolated.  Have a family history of depression.  Have a history of depression or other mental health problems before the stroke.  Are unable to work or do activities that you previously enjoyed.  Need help from others for daily activities.  Use drugs or drink alcohol.  Take certain medicines, such as sleeping pills or high blood pressure medicines. What are some coping methods I can use? Ask your health care provider for help. Your health care provider may recommend treatments for depression, such as:  Talk therapy or counseling with a mental health professional. This may include cognitive behavioral therapy (CBT) to help change your patterns of thinking.  Medical therapies, such as brain stimulation or light therapy.  Lifestyle changes, such as eating a healthy diet and avoiding alcohol.  Antidepressant medicines.  Alternative therapies, such as acupuncture, music therapy, or pet therapy. Other  coping strategies you may try include:  Physical therapy or exercises. Try to do some exercise every day.  Writing your thoughts in a journal. An example might be keeping track of unrealistic thoughts or keeping a log of things you are thankful for.  Practicing good sleep habits, such as  getting up at the same time every day.  Following a predictable routine each day.  Participating in activities that make you laugh.  Mindfulness therapy. This may include meditation and other techniques to lower your stress.  Joining a support group for people who are recovering from a stroke. These groups provide social interaction and help you feel connected to others. Your health care team can help you find a support group in your area.   Summary  Your emotional health may change after a stroke. You may have fear, anxiety, anger, sadness, and other feelings.  It is important to recognize the symptoms of depression and other emotional problems.  If you experience emotional changes, let your loved ones know and contact your health care provider. This information is not intended to replace advice given to you by your health care provider. Make sure you discuss any questions you have with your health care provider. Document Revised: 06/26/2017 Document Reviewed: 10/17/2016 Elsevier Patient Education  2021 [employer].     Sertraline Tablets What is this medicine? SERTRALINE (SER tra leen) is used to treat depression. It may also be used to treat obsessive compulsive disorder, panic disorder, post-trauma stress disorder, premenstrual dysphoric disorder (PMDD) or social anxiety. This medicine may be used for other purposes; ask your health care provider or pharmacist if you have questions. COMMON BRAND NAME(S): Zoloft What should I tell my health care provider before I take this medicine? They need to know if you have any of these conditions:  bleeding disorders  bipolar disorder or a family history of bipolar disorder  glaucoma  heart disease  high blood pressure  history of irregular heartbeat  history of low levels of calcium, magnesium, or potassium in the blood  if you often drink alcohol  liver disease  receiving electroconvulsive therapy  seizures  suicidal  thoughts, plans, or attempt; a previous suicide attempt by you or a family member  take medicines that treat or prevent blood clots  thyroid disease  an unusual or allergic reaction to sertraline, other medicines, foods, dyes, or preservatives  pregnant or trying to get pregnant  breast-feeding How should I use this medicine? Take this medicine by mouth with a glass of water. Follow the directions on the prescription label. You can take it with or without food. Take your medicine at regular intervals. Do not take your medicine more often than directed. Do not stop taking this medicine suddenly except upon the advice of your doctor. Stopping this medicine too quickly may cause serious side effects or your condition may worsen. A special MedGuide will be given to you by the pharmacist with each prescription and refill. Be sure to read this information carefully each time. Talk to your pediatrician regarding the use of this medicine in children. While this drug may be prescribed for children as young as 7 years for selected conditions, precautions do apply. Overdosage: If you think you have taken too much of this medicine contact a poison control center or emergency room at once. NOTE: This medicine is only for you. Do not share this medicine with others. What if I miss a dose? If you miss a dose, take it as soon  as you can. If it is almost time for your next dose, take only that dose. Do not take double or extra doses. What may interact with this medicine? Do not take this medicine with any of the following medications:  cisapride  dronedarone  linezolid  MAOIs like Carbex, Eldepryl, Marplan, Nardil, and Parnate  methylene blue (injected into a vein)  pimozide  thioridazine This medicine may also interact with the following medications:  alcohol  amphetamines  aspirin and aspirin-like medicines  certain medicines for depression, anxiety, or psychotic disturbances  certain  medicines for fungal infections like ketoconazole, fluconazole, posaconazole, and itraconazole  certain medicines for irregular heart beat like flecainide, quinidine, propafenone  certain medicines for migraine headaches like almotriptan, eletriptan, frovatriptan, naratriptan, rizatriptan, sumatriptan, zolmitriptan  certain medicines for sleep  certain medicines for seizures like carbamazepine, valproic acid, phenytoin  certain medicines that treat or prevent blood clots like warfarin, enoxaparin, dalteparin  cimetidine  digoxin  diuretics  fentanyl  isoniazid  lithium  NSAIDs, medicines for pain and inflammation, like ibuprofen or naproxen  other medicines that prolong the QT interval (cause an abnormal heart rhythm) like dofetilide  rasagiline  safinamide  supplements like St. John's wort, kava kava, valerian  tolbutamide  tramadol  tryptophan This list may not describe all possible interactions. Give your health care provider a list of all the medicines, herbs, non-prescription drugs, or dietary supplements you use. Also tell them if you smoke, drink alcohol, or use illegal drugs. Some items may interact with your medicine. What should I watch for while using this medicine? Tell your doctor if your symptoms do not get better or if they get worse. Visit your doctor or health care professional for regular checks on your progress. Because it may take several weeks to see the full effects of this medicine, it is important to continue your treatment as prescribed by your doctor. Patients and their families should watch out for new or worsening thoughts of suicide or depression. Also watch out for sudden changes in feelings such as feeling anxious, agitated, panicky, irritable, hostile, aggressive, impulsive, severely restless, overly excited and hyperactive, or not being able to sleep. If this happens, especially at the beginning of treatment or after a change in dose, call  your health care professional. Dennis Bast may get drowsy or dizzy. Do not drive, use machinery, or do anything that needs mental alertness until you know how this medicine affects you. Do not stand or sit up quickly, especially if you are an older patient. This reduces the risk of dizzy or fainting spells. Alcohol may interfere with the effect of this medicine. Avoid alcoholic drinks. Your mouth may get dry. Chewing sugarless gum or sucking hard candy, and drinking plenty of water may help. Contact your doctor if the problem does not go away or is severe. What side effects may I notice from receiving this medicine? Side effects that you should report to your doctor or health care professional as soon as possible:  allergic reactions like skin rash, itching or hives, swelling of the face, lips, or tongue  anxious  black, tarry stools  changes in vision  confusion  elevated mood, decreased need for sleep, racing thoughts, impulsive behavior  eye pain  fast, irregular heartbeat  feeling faint or lightheaded, falls  feeling agitated, angry, or irritable  hallucination, loss of contact with reality  loss of balance or coordination  loss of memory  painful or prolonged erections  restlessness, pacing, inability to keep still  seizures  stiff muscles  suicidal thoughts or other mood changes  trouble sleeping  unusual bleeding or bruising  unusually weak or tired  vomiting Side effects that usually do not require medical attention (report to your doctor or health care professional if they continue or are bothersome):  change in appetite or weight  change in sex drive or performance  diarrhea  increased sweating  indigestion, nausea  tremors This list may not describe all possible side effects. Call your doctor for medical advice about side effects. You may report side effects to FDA at 1-800-FDA-1088. Where should I keep my medicine? Keep out of the reach of  children. Store at room temperature between 15 and 30 degrees C (59 and 86 degrees F). Throw away any unused medicine after the expiration date. NOTE: This sheet is a summary. It may not cover all possible information. If you have questions about this medicine, talk to your doctor, pharmacist, or health care provider.  2021 Elsevier/Gold Standard (2020-05-24 09:18:23)

## 2020-09-06 DIAGNOSIS — H40011 Open angle with borderline findings, low risk, right eye: Secondary | ICD-10-CM | POA: Diagnosis not present

## 2020-09-06 DIAGNOSIS — H02831 Dermatochalasis of right upper eyelid: Secondary | ICD-10-CM | POA: Diagnosis not present

## 2020-09-06 DIAGNOSIS — H04123 Dry eye syndrome of bilateral lacrimal glands: Secondary | ICD-10-CM | POA: Diagnosis not present

## 2020-09-06 DIAGNOSIS — H16213 Exposure keratoconjunctivitis, bilateral: Secondary | ICD-10-CM | POA: Diagnosis not present

## 2020-09-10 NOTE — Progress Notes (Signed)
Carelink Summary Report / Loop Recorder 

## 2020-09-14 DIAGNOSIS — M542 Cervicalgia: Secondary | ICD-10-CM | POA: Diagnosis not present

## 2020-09-14 DIAGNOSIS — M9905 Segmental and somatic dysfunction of pelvic region: Secondary | ICD-10-CM | POA: Diagnosis not present

## 2020-09-14 DIAGNOSIS — M9901 Segmental and somatic dysfunction of cervical region: Secondary | ICD-10-CM | POA: Diagnosis not present

## 2020-09-14 DIAGNOSIS — M5136 Other intervertebral disc degeneration, lumbar region: Secondary | ICD-10-CM | POA: Diagnosis not present

## 2020-09-14 DIAGNOSIS — M546 Pain in thoracic spine: Secondary | ICD-10-CM | POA: Diagnosis not present

## 2020-09-14 DIAGNOSIS — M9903 Segmental and somatic dysfunction of lumbar region: Secondary | ICD-10-CM | POA: Diagnosis not present

## 2020-09-14 DIAGNOSIS — M9902 Segmental and somatic dysfunction of thoracic region: Secondary | ICD-10-CM | POA: Diagnosis not present

## 2020-09-21 DIAGNOSIS — G8929 Other chronic pain: Secondary | ICD-10-CM | POA: Diagnosis not present

## 2020-09-21 DIAGNOSIS — Z008 Encounter for other general examination: Secondary | ICD-10-CM | POA: Diagnosis not present

## 2020-09-21 DIAGNOSIS — I10 Essential (primary) hypertension: Secondary | ICD-10-CM | POA: Diagnosis not present

## 2020-09-21 DIAGNOSIS — E785 Hyperlipidemia, unspecified: Secondary | ICD-10-CM | POA: Diagnosis not present

## 2020-09-21 DIAGNOSIS — J309 Allergic rhinitis, unspecified: Secondary | ICD-10-CM | POA: Diagnosis not present

## 2020-09-21 DIAGNOSIS — M8580 Other specified disorders of bone density and structure, unspecified site: Secondary | ICD-10-CM | POA: Diagnosis not present

## 2020-09-21 DIAGNOSIS — Z8249 Family history of ischemic heart disease and other diseases of the circulatory system: Secondary | ICD-10-CM | POA: Diagnosis not present

## 2020-09-21 DIAGNOSIS — M1990 Unspecified osteoarthritis, unspecified site: Secondary | ICD-10-CM | POA: Diagnosis not present

## 2020-09-21 DIAGNOSIS — Z7902 Long term (current) use of antithrombotics/antiplatelets: Secondary | ICD-10-CM | POA: Diagnosis not present

## 2020-09-21 DIAGNOSIS — I69354 Hemiplegia and hemiparesis following cerebral infarction affecting left non-dominant side: Secondary | ICD-10-CM | POA: Diagnosis not present

## 2020-09-25 DIAGNOSIS — I10 Essential (primary) hypertension: Secondary | ICD-10-CM | POA: Diagnosis not present

## 2020-09-25 DIAGNOSIS — R5382 Chronic fatigue, unspecified: Secondary | ICD-10-CM | POA: Diagnosis not present

## 2020-09-25 DIAGNOSIS — R69 Illness, unspecified: Secondary | ICD-10-CM | POA: Diagnosis not present

## 2020-09-28 DIAGNOSIS — M9905 Segmental and somatic dysfunction of pelvic region: Secondary | ICD-10-CM | POA: Diagnosis not present

## 2020-09-28 DIAGNOSIS — M9902 Segmental and somatic dysfunction of thoracic region: Secondary | ICD-10-CM | POA: Diagnosis not present

## 2020-09-28 DIAGNOSIS — M542 Cervicalgia: Secondary | ICD-10-CM | POA: Diagnosis not present

## 2020-09-28 DIAGNOSIS — M5136 Other intervertebral disc degeneration, lumbar region: Secondary | ICD-10-CM | POA: Diagnosis not present

## 2020-09-28 DIAGNOSIS — M546 Pain in thoracic spine: Secondary | ICD-10-CM | POA: Diagnosis not present

## 2020-09-28 DIAGNOSIS — M9901 Segmental and somatic dysfunction of cervical region: Secondary | ICD-10-CM | POA: Diagnosis not present

## 2020-09-28 DIAGNOSIS — M9903 Segmental and somatic dysfunction of lumbar region: Secondary | ICD-10-CM | POA: Diagnosis not present

## 2020-10-01 ENCOUNTER — Other Ambulatory Visit (HOSPITAL_COMMUNITY): Payer: Self-pay | Admitting: Obstetrics & Gynecology

## 2020-10-01 DIAGNOSIS — Z1231 Encounter for screening mammogram for malignant neoplasm of breast: Secondary | ICD-10-CM

## 2020-10-08 ENCOUNTER — Ambulatory Visit (INDEPENDENT_AMBULATORY_CARE_PROVIDER_SITE_OTHER): Payer: Medicare HMO

## 2020-10-08 DIAGNOSIS — I639 Cerebral infarction, unspecified: Secondary | ICD-10-CM | POA: Diagnosis not present

## 2020-10-10 LAB — CUP PACEART REMOTE DEVICE CHECK
Date Time Interrogation Session: 20220309004939
Implantable Pulse Generator Implant Date: 20210609

## 2020-10-11 ENCOUNTER — Ambulatory Visit (HOSPITAL_COMMUNITY)
Admission: RE | Admit: 2020-10-11 | Discharge: 2020-10-11 | Disposition: A | Discharge status: Home / Self Care | Payer: Medicare HMO | Source: Ambulatory Visit | Attending: Obstetrics & Gynecology | Admitting: Obstetrics & Gynecology

## 2020-10-11 ENCOUNTER — Other Ambulatory Visit: Payer: Self-pay

## 2020-10-11 DIAGNOSIS — Z1231 Encounter for screening mammogram for malignant neoplasm of breast: Secondary | ICD-10-CM

## 2020-10-12 DIAGNOSIS — M546 Pain in thoracic spine: Secondary | ICD-10-CM | POA: Diagnosis not present

## 2020-10-12 DIAGNOSIS — M9905 Segmental and somatic dysfunction of pelvic region: Secondary | ICD-10-CM | POA: Diagnosis not present

## 2020-10-12 DIAGNOSIS — M9902 Segmental and somatic dysfunction of thoracic region: Secondary | ICD-10-CM | POA: Diagnosis not present

## 2020-10-12 DIAGNOSIS — M5136 Other intervertebral disc degeneration, lumbar region: Secondary | ICD-10-CM | POA: Diagnosis not present

## 2020-10-12 DIAGNOSIS — M9903 Segmental and somatic dysfunction of lumbar region: Secondary | ICD-10-CM | POA: Diagnosis not present

## 2020-10-12 DIAGNOSIS — M542 Cervicalgia: Secondary | ICD-10-CM | POA: Diagnosis not present

## 2020-10-12 DIAGNOSIS — M9901 Segmental and somatic dysfunction of cervical region: Secondary | ICD-10-CM | POA: Diagnosis not present

## 2020-10-16 NOTE — Progress Notes (Signed)
Carelink Summary Report / Loop Recorder 

## 2020-10-25 DIAGNOSIS — R69 Illness, unspecified: Secondary | ICD-10-CM | POA: Diagnosis not present

## 2020-10-25 DIAGNOSIS — I10 Essential (primary) hypertension: Secondary | ICD-10-CM | POA: Diagnosis not present

## 2020-11-12 ENCOUNTER — Ambulatory Visit (INDEPENDENT_AMBULATORY_CARE_PROVIDER_SITE_OTHER): Payer: Medicare HMO

## 2020-11-12 DIAGNOSIS — I639 Cerebral infarction, unspecified: Secondary | ICD-10-CM | POA: Diagnosis not present

## 2020-11-13 LAB — CUP PACEART REMOTE DEVICE CHECK
Date Time Interrogation Session: 20220411015217
Implantable Pulse Generator Implant Date: 20210609

## 2020-11-16 ENCOUNTER — Ambulatory Visit (HOSPITAL_BASED_OUTPATIENT_CLINIC_OR_DEPARTMENT_OTHER): Payer: Medicare HMO | Admitting: Obstetrics & Gynecology

## 2020-11-16 DIAGNOSIS — M9903 Segmental and somatic dysfunction of lumbar region: Secondary | ICD-10-CM | POA: Diagnosis not present

## 2020-11-16 DIAGNOSIS — M9902 Segmental and somatic dysfunction of thoracic region: Secondary | ICD-10-CM | POA: Diagnosis not present

## 2020-11-16 DIAGNOSIS — M9905 Segmental and somatic dysfunction of pelvic region: Secondary | ICD-10-CM | POA: Diagnosis not present

## 2020-11-16 DIAGNOSIS — M546 Pain in thoracic spine: Secondary | ICD-10-CM | POA: Diagnosis not present

## 2020-11-16 DIAGNOSIS — M542 Cervicalgia: Secondary | ICD-10-CM | POA: Diagnosis not present

## 2020-11-16 DIAGNOSIS — M9901 Segmental and somatic dysfunction of cervical region: Secondary | ICD-10-CM | POA: Diagnosis not present

## 2020-11-16 DIAGNOSIS — M5136 Other intervertebral disc degeneration, lumbar region: Secondary | ICD-10-CM | POA: Diagnosis not present

## 2020-11-29 NOTE — Progress Notes (Signed)
Carelink Summary Report / Loop Recorder 

## 2020-11-30 DIAGNOSIS — E7800 Pure hypercholesterolemia, unspecified: Secondary | ICD-10-CM | POA: Diagnosis not present

## 2020-11-30 DIAGNOSIS — I10 Essential (primary) hypertension: Secondary | ICD-10-CM | POA: Diagnosis not present

## 2020-11-30 DIAGNOSIS — M8580 Other specified disorders of bone density and structure, unspecified site: Secondary | ICD-10-CM | POA: Diagnosis not present

## 2020-12-07 DIAGNOSIS — M8580 Other specified disorders of bone density and structure, unspecified site: Secondary | ICD-10-CM | POA: Diagnosis not present

## 2020-12-07 DIAGNOSIS — Z1339 Encounter for screening examination for other mental health and behavioral disorders: Secondary | ICD-10-CM | POA: Diagnosis not present

## 2020-12-07 DIAGNOSIS — R82998 Other abnormal findings in urine: Secondary | ICD-10-CM | POA: Diagnosis not present

## 2020-12-07 DIAGNOSIS — K259 Gastric ulcer, unspecified as acute or chronic, without hemorrhage or perforation: Secondary | ICD-10-CM | POA: Diagnosis not present

## 2020-12-07 DIAGNOSIS — Z1331 Encounter for screening for depression: Secondary | ICD-10-CM | POA: Diagnosis not present

## 2020-12-07 DIAGNOSIS — Z0000 Encounter for general adult medical examination without abnormal findings: Secondary | ICD-10-CM | POA: Diagnosis not present

## 2020-12-07 DIAGNOSIS — M1990 Unspecified osteoarthritis, unspecified site: Secondary | ICD-10-CM | POA: Diagnosis not present

## 2020-12-07 DIAGNOSIS — E7800 Pure hypercholesterolemia, unspecified: Secondary | ICD-10-CM | POA: Diagnosis not present

## 2020-12-07 DIAGNOSIS — I679 Cerebrovascular disease, unspecified: Secondary | ICD-10-CM | POA: Diagnosis not present

## 2020-12-07 DIAGNOSIS — I10 Essential (primary) hypertension: Secondary | ICD-10-CM | POA: Diagnosis not present

## 2020-12-07 DIAGNOSIS — M545 Low back pain, unspecified: Secondary | ICD-10-CM | POA: Diagnosis not present

## 2020-12-07 DIAGNOSIS — R5381 Other malaise: Secondary | ICD-10-CM | POA: Diagnosis not present

## 2020-12-13 LAB — CUP PACEART REMOTE DEVICE CHECK
Date Time Interrogation Session: 20220514015317
Implantable Pulse Generator Implant Date: 20210609

## 2020-12-14 DIAGNOSIS — M5136 Other intervertebral disc degeneration, lumbar region: Secondary | ICD-10-CM | POA: Diagnosis not present

## 2020-12-14 DIAGNOSIS — M9905 Segmental and somatic dysfunction of pelvic region: Secondary | ICD-10-CM | POA: Diagnosis not present

## 2020-12-14 DIAGNOSIS — M9903 Segmental and somatic dysfunction of lumbar region: Secondary | ICD-10-CM | POA: Diagnosis not present

## 2020-12-14 DIAGNOSIS — M9902 Segmental and somatic dysfunction of thoracic region: Secondary | ICD-10-CM | POA: Diagnosis not present

## 2020-12-14 DIAGNOSIS — M546 Pain in thoracic spine: Secondary | ICD-10-CM | POA: Diagnosis not present

## 2020-12-14 DIAGNOSIS — M542 Cervicalgia: Secondary | ICD-10-CM | POA: Diagnosis not present

## 2020-12-14 DIAGNOSIS — M9901 Segmental and somatic dysfunction of cervical region: Secondary | ICD-10-CM | POA: Diagnosis not present

## 2020-12-17 ENCOUNTER — Ambulatory Visit (INDEPENDENT_AMBULATORY_CARE_PROVIDER_SITE_OTHER): Payer: Medicare HMO

## 2020-12-17 DIAGNOSIS — I639 Cerebral infarction, unspecified: Secondary | ICD-10-CM | POA: Diagnosis not present

## 2020-12-26 ENCOUNTER — Ambulatory Visit (INDEPENDENT_AMBULATORY_CARE_PROVIDER_SITE_OTHER): Payer: Medicare HMO | Admitting: Obstetrics & Gynecology

## 2020-12-26 ENCOUNTER — Other Ambulatory Visit: Payer: Self-pay

## 2020-12-26 ENCOUNTER — Encounter (HOSPITAL_BASED_OUTPATIENT_CLINIC_OR_DEPARTMENT_OTHER): Payer: Self-pay | Admitting: Obstetrics & Gynecology

## 2020-12-26 VITALS — BP 136/70 | HR 88 | Ht 62.0 in | Wt 113.0 lb

## 2020-12-26 DIAGNOSIS — Z01411 Encounter for gynecological examination (general) (routine) with abnormal findings: Secondary | ICD-10-CM

## 2020-12-26 DIAGNOSIS — Z90710 Acquired absence of both cervix and uterus: Secondary | ICD-10-CM

## 2020-12-26 DIAGNOSIS — Z780 Asymptomatic menopausal state: Secondary | ICD-10-CM

## 2020-12-26 DIAGNOSIS — F419 Anxiety disorder, unspecified: Secondary | ICD-10-CM | POA: Insufficient documentation

## 2020-12-26 DIAGNOSIS — F0630 Mood disorder due to known physiological condition, unspecified: Secondary | ICD-10-CM | POA: Diagnosis not present

## 2020-12-26 DIAGNOSIS — I69398 Other sequelae of cerebral infarction: Secondary | ICD-10-CM | POA: Diagnosis not present

## 2020-12-26 DIAGNOSIS — I639 Cerebral infarction, unspecified: Secondary | ICD-10-CM | POA: Diagnosis not present

## 2020-12-26 DIAGNOSIS — N182 Chronic kidney disease, stage 2 (mild): Secondary | ICD-10-CM | POA: Insufficient documentation

## 2020-12-26 DIAGNOSIS — R69 Illness, unspecified: Secondary | ICD-10-CM | POA: Diagnosis not present

## 2020-12-26 MED ORDER — SERTRALINE HCL 50 MG PO TABS: 50.0000 mg | ORAL_TABLET | Freq: Every day | ORAL | 0 refills | Status: DC

## 2020-12-26 NOTE — Progress Notes (Signed)
80 y.o. G2P2 Married White or Caucasian female here for breast and pelvic exam.  H/o breast cancer.  Does MMG.  Had stroke last June.  Got up in the middle of the night to go to the bathroom and drank a little water.  She thought she had a little trouble swallowing but went back to bed.  Woke up and had vision issues and issues with talking.  Went to ER in [place] and then was transferred to [facility].  She had some issues after the stroke with blood pressure issues.  Had loop recorder placed.  On Plavix.  Does not feel she has any residual effects except she has some increased fatigue issues.  Denies vaginal bleeding.  No LMP recorded (lmp unknown). Patient has had a hysterectomy.          Sexually active: No.  H/O STD:  no  Health Maintenance: PCP:  Dr. Osborne Casco.  Last wellness appt was 11/25/2020.  Did blood work at that appt:  yes Vaccines are up to date:  Pt has not done shignrix and I do not have date for tetanus Colonoscopy:  12/20/10 MMG:  10/11/20 BMD: pt reports Dr Osborne Casco wants her to do one this year Last pap smear:  Hysterectomy.   H/o abnormal pap smear:      reports that she has never smoked. She has never used smokeless tobacco. She reports that she does not drink alcohol and does not use drugs.  Past Medical History:  Diagnosis Date  . Arthritis   . Breast cancer ([place]) 06/2006   right breast/tx with lumpectomy and tamoxifen  . COVID-19   . Gastric ulcer    no NSAIDs  . Hypertension    under control  . Interstitial cystitis   . Osteopenia    no change    Past Surgical History:  Procedure Laterality Date  . BILATERAL SALPINGOOPHORECTOMY  age 89s  . BIOPSY  05/31/2019   Procedure: BIOPSY;  Surgeon: Daneil Dolin, MD;  Location: [facility];  Service: Endoscopy;;  . BREAST SURGERY Right    lumpectomy-multiple times  . BUNIONECTOMY WITH HAMMERTOE RECONSTRUCTION Bilateral ~2000  . CATARACT EXTRACTION Bilateral 2007  . ESOPHAGOGASTRODUODENOSCOPY N/A 05/31/2019    Dr. Gala Romney: widely patent Schatzki ring, non-bleeding cratered ulcer with adherent clot injected/thermally sealed. motled gastric mucosa with benign biopsy negative for H.pylori. plans for 3 month surveillance EGD.  [name] ESOPHAGOGASTRODUODENOSCOPY N/A 10/05/2019   Procedure: ESOPHAGOGASTRODUODENOSCOPY (EGD);  Surgeon: Daneil Dolin, MD;  Location: [facility];  Service: Endoscopy;  Laterality: N/A;  2:00pm - moved up per office  . KNEE SURGERY Bilateral as teen  . LOOP RECORDER INSERTION N/A 01/04/2020   Procedure: LOOP RECORDER INSERTION;  Surgeon: Constance Haw, MD;  Location: [facility];  Service: Cardiovascular;  Laterality: N/A;  . TONSILLECTOMY AND ADENOIDECTOMY  age 35  . 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 TOTAL VAGINAL HYSTERECTOMY  age 48    Current Outpatient Medications  Medication Sig Dispense Refill  . acetaminophen (TYLENOL) 650 MG CR tablet Take 650-1,300 mg by mouth 2 (two) times daily as needed for pain.    [name] amLODipine (NORVASC) 5 MG tablet Take 5 mg by mouth daily.    . Ascorbic Acid (VITAMIN C) 1000 MG tablet Take 1,000 mg by mouth daily.    [name] atorvastatin (LIPITOR)  40 MG tablet Take 1 tablet (40 mg total) by mouth daily. 30 tablet 1  . beta carotene w/minerals (OCUVITE) tablet Take 1 tablet by mouth daily.    . Calcium-Vitamin D (CALTRATE 600 PLUS-VIT D PO) Take 1 tablet by mouth daily.     . carboxymethylcellul-glycerin (OPTIVE) 0.5-0.9 % ophthalmic solution Place 1 drop into both eyes daily as needed for dry eyes.    . Cholecalciferol (VITAMIN D3) 50 MCG (2000 UT) TABS Take 2,000 Units by mouth daily.    . clopidogrel (PLAVIX) 75 MG tablet Take 1 tablet (75 mg total) by mouth daily. 30 tablet 1  . docusate sodium (COLACE) 100 MG capsule Take 100 mg by mouth daily as needed (constipation.).      [name] hydrochlorothiazide (HYDRODIURIL) 12.5 MG tablet Take 12.5 mg by mouth daily.    [name] loratadine (CLARITIN) 10 MG tablet Take 10 mg by mouth daily.    . NON FORMULARY Take 1 capsule by mouth daily. Cysta-Q    . polyethylene glycol (MIRALAX / GLYCOLAX) 17 g packet Take 17 g by mouth daily as needed for mild constipation. 14 each 0  . quinapril (ACCUPRIL) 20 MG tablet Take 1 tablet (20 mg total) by mouth daily.    . sertraline (ZOLOFT) 50 MG tablet Take 1 tablet (50 mg total) by mouth daily. 30 tablet 0   No current facility-administered medications for this visit.    Family History  Problem Relation Age of Onset  . Cancer Mother        breast/mastectomy  . Heart disease Mother        poor circulation  . Deep vein thrombosis Mother   . Osteoporosis Mother   . Hypertension Father   . Colon cancer Neg Hx     Review of Systems  Constitutional: Negative.   Gastrointestinal: Negative.   Genitourinary: Negative.   Psychiatric/Behavioral: Negative.     Exam:   BP 136/70   Pulse 88   Ht 5\' 2"  (1.575 m)   Wt 113 lb (51.3 kg)   LMP  (LMP Unknown)   BMI 20.67 kg/m   Height: 5\' 2"  (157.5 cm) -6##  General appearance: alert, cooperative and appears stated age Breasts: normal appearance, no masses or tenderness, well healed right breast scar Abdomen: soft, non-tender; bowel sounds normal; no masses,  no organomegaly Lymph nodes: Cervical, supraclavicular, and axillary nodes normal.  No abnormal inguinal nodes palpated Neurologic: Grossly normal  Pelvic: External genitalia:  no lesions              Urethra:  normal appearing urethra with no masses, tenderness or lesions              Bartholins and Skenes: normal                 Vagina: normal appearing vagina with atrophic changes and no discharge, no lesions              Cervix: absent              Pap taken: No. Bimanual Exam:  Uterus:  uterus absent              Adnexa: no mass, fullness, tenderness               Rectovaginal:  Confirms               Anus:  normal sphincter tone, no lesions  Chaperone, Octaviano Batty, CMA, was present for exam.  Assessment/Plan: 1.  Encntr for gyn exam (general) (routine) w abnormal findings - pap smear not indicated - MMG completed - colonoscopy not indicated - lab work with Dr. Osborne Casco - vaccines updated  2. Postmenopausal - no HRT  3. H/O total hysterectomy  4. 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  Dispense: 30 tablet; Refill: 0  5. Cerebrovascular accident (CVA), unspecified mechanism ([place])

## 2020-12-28 ENCOUNTER — Ambulatory Visit: Payer: Medicare HMO | Admitting: Obstetrics & Gynecology

## 2021-01-07 ENCOUNTER — Telehealth: Payer: Self-pay | Admitting: Adult Health

## 2021-01-07 NOTE — Telephone Encounter (Signed)
F/u cancelled due to np out of office.

## 2021-01-07 NOTE — Progress Notes (Signed)
Carelink Summary Report / Loop Recorder 

## 2021-01-08 ENCOUNTER — Ambulatory Visit: Payer: Medicare HMO | Admitting: Adult Health

## 2021-01-11 DIAGNOSIS — M546 Pain in thoracic spine: Secondary | ICD-10-CM | POA: Diagnosis not present

## 2021-01-11 DIAGNOSIS — M5136 Other intervertebral disc degeneration, lumbar region: Secondary | ICD-10-CM | POA: Diagnosis not present

## 2021-01-11 DIAGNOSIS — M9901 Segmental and somatic dysfunction of cervical region: Secondary | ICD-10-CM | POA: Diagnosis not present

## 2021-01-11 DIAGNOSIS — M542 Cervicalgia: Secondary | ICD-10-CM | POA: Diagnosis not present

## 2021-01-11 DIAGNOSIS — M9902 Segmental and somatic dysfunction of thoracic region: Secondary | ICD-10-CM | POA: Diagnosis not present

## 2021-01-11 DIAGNOSIS — M9903 Segmental and somatic dysfunction of lumbar region: Secondary | ICD-10-CM | POA: Diagnosis not present

## 2021-01-11 DIAGNOSIS — M9905 Segmental and somatic dysfunction of pelvic region: Secondary | ICD-10-CM | POA: Diagnosis not present

## 2021-01-14 ENCOUNTER — Ambulatory Visit (INDEPENDENT_AMBULATORY_CARE_PROVIDER_SITE_OTHER): Payer: Medicare HMO

## 2021-01-14 DIAGNOSIS — I639 Cerebral infarction, unspecified: Secondary | ICD-10-CM | POA: Diagnosis not present

## 2021-01-14 LAB — CUP PACEART REMOTE DEVICE CHECK
Date Time Interrogation Session: 20220616015715
Implantable Pulse Generator Implant Date: 20210609

## 2021-01-22 ENCOUNTER — Encounter: Payer: Self-pay | Admitting: Adult Health

## 2021-01-22 ENCOUNTER — Ambulatory Visit: Payer: Medicare HMO | Admitting: Adult Health

## 2021-01-22 VITALS — BP 157/79 | HR 67 | Ht 64.0 in | Wt 117.0 lb

## 2021-01-22 DIAGNOSIS — E538 Deficiency of other specified B group vitamins: Secondary | ICD-10-CM | POA: Diagnosis not present

## 2021-01-22 DIAGNOSIS — I69398 Other sequelae of cerebral infarction: Secondary | ICD-10-CM | POA: Diagnosis not present

## 2021-01-22 DIAGNOSIS — I639 Cerebral infarction, unspecified: Secondary | ICD-10-CM | POA: Diagnosis not present

## 2021-01-22 DIAGNOSIS — G4719 Other hypersomnia: Secondary | ICD-10-CM

## 2021-01-22 DIAGNOSIS — F418 Other specified anxiety disorders: Secondary | ICD-10-CM | POA: Diagnosis not present

## 2021-01-22 DIAGNOSIS — R69 Illness, unspecified: Secondary | ICD-10-CM | POA: Diagnosis not present

## 2021-01-22 NOTE — Patient Instructions (Addendum)
Your Plan:  We will check your B12  and thyroid level today for possible causes of fatigue  Referral placed for evaluation to our sleep clinic for possible underlying sleep apnea  Continue sertraline 50mg  but start taking at night time as this can contribute to your fatigue taking it in the morning  It is important to find a happy medium between activity and rest - would recommend doing your workouts every other day and ensure you take time for rest your brain mentally and physically    Follow-up in 4 months or call earlier if needed     Thank you for coming to see [place] at [facility]. I hope we have been able to provide you high quality care today.  You may receive a patient satisfaction survey over the next few weeks. We would appreciate your feedback and comments so that we may continue to improve ourselves and the health of our patients.

## 2021-01-22 NOTE — Progress Notes (Signed)
[facility] [address] [place]. [place] [address] [phone]       OFFICE FOLLOW-UP NOTE  Doris Ford Date of Birth:  19-Oct-1940 Medical Record Number:  [id number]    Reason for visit: Stroke follow-up  Chief Complaint  Patient presents with   Follow-up    RM 72 with daughter janet Pt is well and stable.       HPI:   Today, 01/22/2021, Doris Ford returns for 71-month stroke follow-up accompanied by her daughter, [name].  She has been stable from stroke standpoint without new or reoccurring stroke/TIA symptoms.  Compliant on Plavix and atorvastatin without associated side effects.  Blood pressure 157/79.  Loop recorder has not shown atrial fibrillation thus far.  Initiated sertraline 25 mg nightly for worsening anxiety with increase to 50 mg nightly by PCP in March which she reports has greatly improved.  She continues to experience mild anxiety only on occasion.  She also continues to struggle with daytime fatigue and has been trying to keep active during the day with exercising 5 times weekly at her local gym and constantly keeping active.  She reports sleeping well at night.  She is not aware of any snoring or witnessed apneas.  No further concerns at this time    History provided for reference purposes only Update 09/05/2020 JM: Doris Ford returns for 47-month stroke follow-up. She has been doing well since prior visit without new recurrent stroke/TIA symptoms. Denies residual focal deficits but does report continued fatigue, lack of energy and anxiety. She has been slowly increasing daily exercise hoping this would help with her fatigue but she continues to struggle on a daily basis. She has remained on atorvastatin and Plavix without side effects. Blood pressure today 142/74. Loop recorder has not shown evidence of atrial fibrillation thus far.  No further concerns at this time.  Initial visit 03/05/2020 Dr. [name]: Doris Ford is a pleasant 80 year old  Caucasian lady seen today for initial office follow-up visit following hospital consultation for stroke in June 2021.  She is accompanied by her daughter.  History is obtained from them, review of electronic medical records and I personally reviewed imaging films in PACS. She has past medical history of hypertension, GI hemorrhage, osteopenia and COVID-19 infection.  She developed sudden onset of facial droop and weakness difficulty drinking water drooling from the corner of the mouth and dysarthria.  She presented outside time window for TPA.  CT scan of the head on admission showed no acute abnormalities.  MRI scan of the brain showed small right MCA territory infarcts involving right frontal operculum, insula and right parietal region.  There was tiny remote age right cerebellar infarct also noted.  CT angiogramof the brain and neck showed only mild extracranial left carotid stenosis.  2D echo showed normal ejection fraction without cardiac source of embolism.  Lower extremity Dopplers were negative for DVT.  LDL cholesterol was elevated at 1 1 5  mg percent and hemoglobin A1c was 5.9.  She had a loop recorder inserted and so for paroxysmal A. fib has not been found.  She was not discharged on aspirin Plavix and stop aspirin is currently on Plavix which is tolerating well with only minor bruising.  She is also on Lipitor which is tolerating well without muscle aches and pains.  Blood pressures well controlled today it is 110/70.  She states that her facial weakness, dysarthria and swallowing difficulties have all resolved completely and she has no residual deficits from the  stroke.  She however feels quite anxious and edgy and is at times has been sharp with people which is new for her.  She denies any prior known history of strokes prior to this event.    ROS:   14 system review of systems is positive for those listed in HPI and all other systems negative  Depression screen [facility] 2/9 01/22/2021 09/05/2020   Decreased Interest 1 0  Down, Depressed, Hopeless 1 1  PHQ - 2 Score 2 1  Altered sleeping 0 2  Tired, decreased energy 2 3  Change in appetite 0 0  Feeling bad or failure about yourself  0 0  Trouble concentrating 1 2  Moving slowly or fidgety/restless 0 1  Suicidal thoughts 0 0  PHQ-9 Score 5 9    GAD 7 : Generalized Anxiety Score 01/22/2021 01/22/2021 01/22/2021 09/05/2020  Nervous, Anxious, on Edge 1 1 1 1   Control/stop worrying 1 1 1 1   Worry too much - different things 1 1 1 1   Trouble relaxing 0 0 0 1  Restless 0 0 0 1  Easily annoyed or irritable 1 1 1 1   Afraid - awful might happen 1 1 1 1   Total GAD 7 Score 5 5 5 7   Anxiety Difficulty Somewhat difficult Somewhat difficult - Somewhat difficult       PMH:  Past Medical History:  Diagnosis Date   Arthritis    Breast cancer ([place]) 06/2006   right breast/tx with lumpectomy and tamoxifen   COVID-19    Gastric ulcer    no NSAIDs   Hypertension    under control   Interstitial cystitis    Osteopenia    no change    Social History:  Social History   Socioeconomic History   Marital status: Married    Spouse name: Not on file   Number of children: Not on file   Years of education: Not on file   Highest education level: Not on file  Occupational History   Not on file  Tobacco Use   Smoking status: Never   Smokeless tobacco: Never  Vaping Use   Vaping Use: Never used  Substance and Sexual Activity   Alcohol use: No   Drug use: No   Sexual activity: Not Currently    Partners: Male    Birth control/protection: Surgical    Comment: TVH  Other Topics Concern   Not on file  Social History Narrative   Not on file   Social Determinants of Health   Financial Resource Strain: Not on file  Food Insecurity: Not on file  Transportation Needs: Not on file  Physical Activity: Not on file  Stress: Not on file  Social Connections: Not on file  Intimate Partner Violence: Not on file    Medications:   Current  Outpatient Medications on File Prior to Visit  Medication Sig Dispense Refill   acetaminophen (TYLENOL) 650 MG CR tablet Take 650-1,300 mg by mouth 2 (two) times daily as needed for pain.     amLODipine (NORVASC) 5 MG tablet Take 5 mg by mouth daily.     Ascorbic Acid (VITAMIN C) 1000 MG tablet Take 1,000 mg by mouth daily.     atorvastatin (LIPITOR) 40 MG tablet Take 1 tablet (40 mg total) by mouth daily. 30 tablet 1   beta carotene w/minerals (OCUVITE) tablet Take 1 tablet by mouth daily.     Calcium-Vitamin D (CALTRATE 600 PLUS-VIT D PO) Take 1 tablet by mouth daily.  carboxymethylcellul-glycerin (OPTIVE) 0.5-0.9 % ophthalmic solution Place 1 drop into both eyes daily as needed for dry eyes.     Cholecalciferol (VITAMIN D3) 50 MCG (2000 UT) TABS Take 2,000 Units by mouth daily.     clopidogrel (PLAVIX) 75 MG tablet Take 1 tablet (75 mg total) by mouth daily. 30 tablet 1   hydrochlorothiazide (HYDRODIURIL) 12.5 MG tablet Take 12.5 mg by mouth daily.     loratadine (CLARITIN) 10 MG tablet Take 10 mg by mouth daily.     NON FORMULARY Take 1 capsule by mouth daily. Cysta-Q     polyethylene glycol (MIRALAX / GLYCOLAX) 17 g packet Take 17 g by mouth daily as needed for mild constipation. 14 each 0   quinapril (ACCUPRIL) 20 MG tablet Take 1 tablet (20 mg total) by mouth daily. (Patient taking differently: Take 20 mg by mouth in the morning and at bedtime.)     sertraline (ZOLOFT) 50 MG tablet Take 1 tablet (50 mg total) by mouth daily. 30 tablet 0   No current facility-administered medications on file prior to visit.    Allergies:   Allergies  Allergen Reactions   Codeine     Severe nausea.  Dizziness.   Macrobid [Nitrofurantoin] Other (See Comments)    Skin eruption    Physical Exam Today's Vitals   01/22/21 1441  BP: (!) 157/79  Pulse: 67  Weight: 117 lb (53.1 kg)  Height: 5\' 4"  (1.626 m)    Body mass index is 20.08 kg/m.    General: Frail very pleasant elderly Caucasian  lady, seated, in no evident distress Head: head normocephalic and atraumatic.  Neck: supple with no carotid or supraclavicular bruits Cardiovascular: regular rate and rhythm, no murmurs Musculoskeletal: no deformity Skin:  no rash/petichiae Vascular:  Normal pulses all extremities  Neurologic Exam Mental Status: Awake and fully alert.  Fluent speech and language.  Oriented to place and time. Recent and remote memory intact. Attention span, concentration and fund of knowledge appropriate. Mood and affect appropriate.  Cranial Nerves: Pupils equal, briskly reactive to light. Extraocular movements full without nystagmus. Visual fields full to confrontation. Hearing slightly diminished bilaterally.. Facial sensation intact. Face, tongue, palate moves normally and symmetrically.  Motor: Normal bulk and tone. Normal strength in all tested extremity muscles. Sensory.: intact to touch ,pinprick .position and vibratory sensation.  Coordination: Rapid alternating movements normal in all extremities. Finger-to-nose and heel-to-shin performed accurately bilaterally. Gait and Station: Arises from chair without difficulty. Stance is normal. Gait demonstrates normal stride length and balance . Able to heel, toe and tandem walk with mild difficulty.  Reflexes: 1+ and symmetric. Toes downgoing.      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     R frontal MCA stroke, cryptogenic -Loop recorder has not shown atrial fibrillation thus far -personally reviewed reports per cardiology -Continue Plavix and atorvastatin 40 mg daily for secondary stroke prevention -Discussed secondary stroke prevention measures and importance of close PCP follow-up for aggressive stroke risk factor  management -HTN: BP goal<130/90.  Satisfactory on amlodipine, hydrochlorothiazide and Accupril per PCP -HLD: LDL goal<70. On atorvastatin 40 mg daily per PCP with recent LDL 62 (09/2020)  Anxiety with depression -PHQ 9 score 7 down from 9 -GAD-7 score 5 down from 7 -Continue sertraline 50 mg daily in addition prescriber PCP -Encouraged continued routine physical  activity and exercise  Excessive daytime fatigue -Likely multifactorial but will check vit B12 and TSH today as well as referral to [place] sleep clinic to rule out possible underlying sleep apnea -Encouraged finding a happy medium between her activity and ensuring rest breaks    Follow-up in 4 months or call earlier if needed    CC:  [facility] provider: Dr. Marda Stalker, [name], MD   I spent 36 minutes of face-to-face and non-face-to-face time with patient and.  This included previsit chart review, lab review, study review, order entry, electronic health record documentation, patient education and discussion regarding history of prior stroke including secondary stroke prevention and aggressive stroke risk factor management, anxiety with depression with review of PHQ-9 and GAD-7, excessive daytime fatigue and possible causes, monitoring of loop recorder, and answered all other questions to patient and daughters satisfaction  Frann Rider, AGNP-BC  [facility] [address] [place], [place] [address]  Phone [phone] Fax [phone] Note: This document was prepared with digital dictation and possible smart phrase technology. Any transcriptional errors that result from this process are unintentional.

## 2021-01-23 LAB — TSH: TSH: 2.16 u[IU]/mL (ref 0.450–4.500)

## 2021-01-23 LAB — VITAMIN B12: Vitamin B-12: >2000 pg/mL — ABNORMAL HIGH (ref 232–1245)

## 2021-01-25 NOTE — Progress Notes (Signed)
I agree with the above plan 

## 2021-02-04 NOTE — Progress Notes (Signed)
Carelink Summary Report / Loop Recorder 

## 2021-02-08 DIAGNOSIS — M9902 Segmental and somatic dysfunction of thoracic region: Secondary | ICD-10-CM | POA: Diagnosis not present

## 2021-02-08 DIAGNOSIS — M542 Cervicalgia: Secondary | ICD-10-CM | POA: Diagnosis not present

## 2021-02-08 DIAGNOSIS — M9901 Segmental and somatic dysfunction of cervical region: Secondary | ICD-10-CM | POA: Diagnosis not present

## 2021-02-08 DIAGNOSIS — M9905 Segmental and somatic dysfunction of pelvic region: Secondary | ICD-10-CM | POA: Diagnosis not present

## 2021-02-08 DIAGNOSIS — M5136 Other intervertebral disc degeneration, lumbar region: Secondary | ICD-10-CM | POA: Diagnosis not present

## 2021-02-08 DIAGNOSIS — M9903 Segmental and somatic dysfunction of lumbar region: Secondary | ICD-10-CM | POA: Diagnosis not present

## 2021-02-08 DIAGNOSIS — M546 Pain in thoracic spine: Secondary | ICD-10-CM | POA: Diagnosis not present

## 2021-02-14 LAB — CUP PACEART REMOTE DEVICE CHECK
Date Time Interrogation Session: 20220719015946
Implantable Pulse Generator Implant Date: 20210609

## 2021-02-18 ENCOUNTER — Ambulatory Visit (INDEPENDENT_AMBULATORY_CARE_PROVIDER_SITE_OTHER): Payer: Medicare HMO

## 2021-02-18 DIAGNOSIS — I639 Cerebral infarction, unspecified: Secondary | ICD-10-CM | POA: Diagnosis not present

## 2021-03-15 NOTE — Progress Notes (Signed)
Carelink Summary Report / Loop Recorder 

## 2021-03-25 ENCOUNTER — Ambulatory Visit (INDEPENDENT_AMBULATORY_CARE_PROVIDER_SITE_OTHER): Payer: Medicare HMO

## 2021-03-25 DIAGNOSIS — I639 Cerebral infarction, unspecified: Secondary | ICD-10-CM | POA: Diagnosis not present

## 2021-03-26 LAB — CUP PACEART REMOTE DEVICE CHECK
Date Time Interrogation Session: 20220821020414
Implantable Pulse Generator Implant Date: 20210609

## 2021-03-27 ENCOUNTER — Institutional Professional Consult (permissible substitution): Payer: Medicare HMO | Admitting: Neurology

## 2021-04-04 ENCOUNTER — Ambulatory Visit: Payer: Medicare HMO | Admitting: Neurology

## 2021-04-04 ENCOUNTER — Encounter: Payer: Self-pay | Admitting: Neurology

## 2021-04-04 VITALS — BP 130/70 | HR 64 | Ht 64.0 in | Wt 115.0 lb

## 2021-04-04 DIAGNOSIS — I639 Cerebral infarction, unspecified: Secondary | ICD-10-CM

## 2021-04-04 DIAGNOSIS — R5383 Other fatigue: Secondary | ICD-10-CM

## 2021-04-04 DIAGNOSIS — R351 Nocturia: Secondary | ICD-10-CM

## 2021-04-04 DIAGNOSIS — G4719 Other hypersomnia: Secondary | ICD-10-CM

## 2021-04-04 NOTE — Progress Notes (Signed)
Subjective:    Patient ID: Doris Ford is a 80 y.o. female.  HPI    Star Age, MD, PhD [facility] [address] [place], [place] [address]  Dear Doris Ford and Doris Ford,   I saw your patient, Doris Ford, upon your kind request in my sleep clinic today for initial consultation of her sleep disorder, in particular, concern for underlying obstructive sleep apnea.  The patient is accompanied by her daughter, Doris Ford, today.  As you know, [name] is an 80 year old right-handed woman with an underlying medical history of arthritis, breast cancer, gastric ulcer, hypertension, interstitial cystitis, osteopenia, and stroke, who reports snoring and excessive daytime somnolence.  I reviewed your office note from 01/22/2021.  Her Epworth sleepiness score is 4 out of 24 today fatigue severity score is 41 out of 63.  Since her stroke in June 2021 patient has been struggling with daytime tiredness and physical exhaustion.  She does not typically take a nap during the day but sleeps an extended time at night.  She generally goes to bed around 10 PM and may sleep till 10 AM.  She estimates that she sleeps about 10 to 12 hours at night.  She has nocturia about twice per average night, denies morning headaches, is not aware of any snoring, family has not noticed it.  Her husband used to have sleep apnea so she is familiar with the diagnosis and with CPAP therapy.  She has no family history of sleep apnea.  She has been on sertraline.  Her daughter is wondering if this could be increased, patient is suffering from lack of initiative and some residual Ford irritability albeit improved since she was started on sertraline which is currently 50 mg daily.  She does not drink caffeine daily, she is a non-smoker and does not drink alcohol.  She lives with her husband.  She has 1 daughter and 1 son.  She had a tonsillectomy as a child.  Her Past Medical History Is  Significant For: Past Medical History:  Diagnosis Date   Arthritis    Breast cancer ([place]) 06/2006   right breast/tx with lumpectomy and tamoxifen   COVID-19    Gastric ulcer    no NSAIDs   Hypertension    under control   Interstitial cystitis    Osteopenia    no change    Her Past Surgical History Is Significant For: Past Surgical History:  Procedure Laterality Date   BILATERAL SALPINGOOPHORECTOMY  age 76s   BIOPSY  05/31/2019   Procedure: BIOPSY;  Surgeon: Daneil Dolin, MD;  [facility]: [facility];  Service: Endoscopy;;   BREAST SURGERY Right    lumpectomy-multiple times   BUNIONECTOMY WITH HAMMERTOE RECONSTRUCTION Bilateral ~2000   CATARACT EXTRACTION Bilateral 2007   ESOPHAGOGASTRODUODENOSCOPY N/A 05/31/2019   Dr. Gala Romney: widely patent Schatzki ring, non-bleeding cratered ulcer with adherent clot injected/thermally sealed. motled gastric mucosa with benign biopsy negative for H.pylori. plans for 3 month surveillance EGD.   ESOPHAGOGASTRODUODENOSCOPY N/A 10/05/2019   Procedure: ESOPHAGOGASTRODUODENOSCOPY (EGD);  Surgeon: Daneil Dolin, MD;  Location: [facility];  Service: Endoscopy;  Laterality: N/A;  2:00pm - moved up per office   KNEE SURGERY Bilateral as teen   LOOP RECORDER INSERTION N/A 01/04/2020   Procedure: LOOP RECORDER INSERTION;  Surgeon: Constance Haw, MD;  Location: [facility];  Service: Cardiovascular;  Laterality: N/A;   TONSILLECTOMY AND ADENOIDECTOMY  age 76   TOTAL HIP ARTHROPLASTY  Right 12/16/2013   Procedure: RIGHT TOTAL HIP ARTHROPLASTY ANTERIOR APPROACH;  Surgeon: Mcarthur Rossetti, MD;  Location: [facility];  Service: Orthopedics;  Laterality: Right;   TOTAL VAGINAL HYSTERECTOMY  age 39    Her Family History Is Significant For: Family History  Problem Relation Age of Onset   Cancer Mother        breast/mastectomy   Heart disease Mother        poor circulation   Deep vein thrombosis Mother    Osteoporosis Mother    Hypertension  Father    Colon cancer Neg Hx    Sleep apnea Neg Hx     Her Social History Is Significant For: Social History   Socioeconomic History   Marital status: Married    Spouse name: Not on file   Number of children: Not on file   Years of education: Not on file   Highest education level: Not on file  Occupational History   Not on file  Tobacco Use   Smoking status: Never   Smokeless tobacco: Never  Vaping Use   Vaping Use: Never used  Substance and Sexual Activity   Alcohol use: No   Drug use: No   Sexual activity: Not Currently    Partners: Male    Birth control/protection: Surgical    Comment: [facility]  Other Topics Concern   Not on file  Social History Narrative   Not on file   Social Determinants of Health   Financial Resource Strain: Not on file  Food Insecurity: Not on file  Transportation Needs: Not on file  Physical Activity: Not on file  Stress: Not on file  Social Connections: Not on file    Her Allergies Are:  Allergies  Allergen Reactions   Codeine     Severe nausea.  Dizziness.   Macrobid [Nitrofurantoin] Other (See Comments)    Skin eruption  :   Her Current Medications Are:  Outpatient Encounter Medications as of 04/04/2021  Medication Sig   acetaminophen (TYLENOL) 650 MG CR tablet Take 650-1,300 mg by mouth 2 (two) times daily as needed for pain.   amLODipine (NORVASC) 5 MG tablet Take 5 mg by mouth daily.   Ascorbic Acid (VITAMIN C) 1000 MG tablet Take 1,000 mg by mouth daily.   atorvastatin (LIPITOR) 40 MG tablet Take 1 tablet (40 mg total) by mouth daily.   beta carotene w/minerals (OCUVITE) tablet Take 1 tablet by mouth daily.   Calcium-Vitamin D (CALTRATE 600 PLUS-VIT D PO) Take 1 tablet by mouth daily.    carboxymethylcellul-glycerin (OPTIVE) 0.5-0.9 % ophthalmic solution Place 1 drop into both eyes daily as needed for dry eyes.   Cholecalciferol (VITAMIN D3) 50 MCG (2000 UT) TABS Take 2,000 Units by mouth daily.   clopidogrel (PLAVIX) 75 MG  tablet Take 1 tablet (75 mg total) by mouth daily.   hydrochlorothiazide (HYDRODIURIL) 12.5 MG tablet Take 12.5 mg by mouth daily.   loratadine (CLARITIN) 10 MG tablet Take 10 mg by mouth daily.   NON FORMULARY Take 1 capsule by mouth daily. Cysta-Q   polyethylene glycol (MIRALAX / GLYCOLAX) 17 g packet Take 17 g by mouth daily as needed for mild constipation.   quinapril (ACCUPRIL) 20 MG tablet Take 1 tablet (20 mg total) by mouth daily. (Patient taking differently: Take 20 mg by mouth in the morning and at bedtime.)   sertraline (ZOLOFT) 50 MG tablet Take 1 tablet (50 mg total) by mouth daily.   No facility-administered encounter medications on  file as of 04/04/2021.  :   Review of Systems:  Out of a complete 14 point review of systems, all are reviewed and negative with the exception of these symptoms as listed below:  Review of Systems  Neurological:        Pt states she is here fir excessive sleeping. Pt states no sleep study . Pt states no headaches  Pt states she sleeps all night . Pt states she gets about 12 hours of sleep a day. Pt daughter states she had a stroke January 01 2020  and every since than sleeps alots and fatigue   ESS:4 FSS:41   Objective:  Neurological Exam  Physical Exam Physical Examination:   Vitals:   04/04/21 1228  BP: 130/70  Pulse: 64    General Examination: The patient is a very pleasant 80 y.o. female in no acute distress. She appears well-developed and well-nourished and well groomed.   HEENT: Normocephalic, atraumatic, pupils are equal, round and reactive to light, extraocular tracking is good without limitation to gaze excursion or nystagmus noted. Hearing is grossly intact. Face is symmetric with normal facial animation. Speech is clear with no dysarthria noted. There is no hypophonia. There is no lip, neck/head, jaw or voice tremor. Neck is supple with full range of passive and active motion. There are no carotid bruits on auscultation. Oropharynx  exam reveals: mild mouth dryness, adequate dental hygiene with partials on the bottom.  Airway on the smaller side, Mallampati class II, thin uvula noted, tonsils absent.  Neck circumference of 12.8 inches.  She has a mild overbite. Tongue protrudes centrally and palate elevates symmetrically.   Chest: Clear to auscultation without wheezing, rhonchi or crackles noted.  Heart: S1+S2+0, regular and normal without murmurs, rubs or gallops noted.   Abdomen: Soft, non-tender and non-distended.  Extremities: There is no pitting edema in the distal lower extremities bilaterally.   Skin: Warm and dry without trophic changes noted.   Musculoskeletal: exam reveals no obvious joint deformities.   Neurologically:  Mental status: The patient is awake, alert and oriented in all 4 spheres. Her immediate and remote memory, attention, language skills and fund of knowledge are appropriate. There is no evidence of aphasia, agnosia, apraxia or anomia. Speech is clear with normal prosody and enunciation. Thought process is linear. Ford is normal and affect is mildly blunted.  Cranial nerves II - XII are as described above under HEENT exam.  Motor exam: Normal bulk, strength and tone is noted. There is no tremor, fine motor skills and coordination: grossly intact.  Cerebellar testing: No dysmetria or intention tremor. There is no truncal or gait ataxia.  Sensory exam: intact to light touch in the upper and lower extremities.  Gait, station and balance: She stands easily. No veering to one side is noted. No leaning to one side is noted. Posture is age-appropriate and stance is narrow based. Gait shows normal stride length and normal pace. No problems turning are noted.   Assessment and Plan:  In summary, ELIZZA CUTCHER is a very pleasant 80 y.o.-year old female with an underlying medical history of arthritis, breast cancer, gastric ulcer, hypertension, interstitial cystitis, osteopenia, and stroke, whose history  and physical exam are concerning for obstructive sleep apnea (OSA). I had a long chat with the patient and her daughter about my findings and the diagnosis of OSA, its prognosis and treatment options. We talked about medical treatments, surgical interventions and non-pharmacological approaches. I explained in particular the risks and ramifications  of untreated moderate to severe OSA, especially with respect to developing cardiovascular disease down the Road, including congestive heart failure, difficult to treat hypertension, cardiac arrhythmias, or stroke. Even type 2 diabetes has, in part, been linked to untreated OSA. Symptoms of untreated OSA include daytime sleepiness, memory problems, Ford irritability and Ford disorder such as depression and anxiety, lack of energy, as well as recurrent headaches, especially morning headaches. We talked about trying to maintain a healthy lifestyle in general, as well as the importance of keeping a healthy weight. I recommended the following at this time: sleep study.  I outlined the difference between a laboratory attended sleep study versus home sleep test and we talked about possible surgical and non-surgical treatment options of OSA, including the use of a custom-made dental device (which would require a referral to a specialist dentist or oral surgeon), upper airway surgical options, such as traditional UPPP or a novel less invasive surgical option in the form of Inspire hypoglossal nerve stimulation (which would involve a referral to an ENT surgeon). I also explained the CPAP treatment option to the patient, who indicated that she would be willing to try CPAP or AutoPap if the need arises.  We will pick up our discussion after testing and we will keep her posted as to her results by phone call in the interim as well.  We will plan a follow-up in sleep clinic accordingly.  They are encouraged to talk to you about the possibility of increasing her sertraline.  Thank you  very much for allowing me to participate in the care of this nice patient. If I can be of any further assistance to you please do not hesitate to talk to me.   Sincerely,   Star Age, MD, PhD

## 2021-04-04 NOTE — Patient Instructions (Signed)
It was nice to meet you today. Based on your symptoms and your exam I believe you are at risk for obstructive sleep apnea (aka OSA), and I think we should proceed with a sleep study to determine whether you do or do not have OSA and how severe it is. Even, if you have mild OSA, I may want you to consider treatment with CPAP, as treatment of even borderline or mild sleep apnea can result and improvement of symptoms such as sleep disruption, daytime sleepiness, nighttime bathroom breaks, restless leg symptoms, improvement of headache syndromes, even improved mood disorder.   As explained, an attended sleep study meaning you get to stay overnight in the sleep lab, lets us monitor sleep-related behaviors such as sleep talking and leg movements in sleep, in addition to monitoring for sleep apnea.  A home sleep test is a screening tool for sleep apnea only, and unfortunately does not help with any other sleep-related diagnoses.  Please remember, the long-term risks and ramifications of untreated moderate to severe obstructive sleep apnea are: increased Cardiovascular disease, including congestive heart failure, stroke, difficult to control hypertension, treatment resistant obesity, arrhythmias, especially irregular heartbeat commonly known as A. Fib. (atrial fibrillation); even type 2 diabetes has been linked to untreated OSA.   Sleep apnea can cause disruption of sleep and sleep deprivation in most cases, which, in turn, can cause recurrent headaches, problems with memory, mood, concentration, focus, and vigilance. Most people with untreated sleep apnea report excessive daytime sleepiness, which can affect their ability to drive. Please do not drive if you feel sleepy. Patients with sleep apnea can also develop difficulty initiating and maintaining sleep (aka insomnia).   Having sleep apnea may increase your risk for other sleep disorders, including involuntary behaviors sleep such as sleep terrors, sleep  talking, sleepwalking.    Having sleep apnea can also increase your risk for restless leg syndrome and leg movements at night.   Please note that untreated obstructive sleep apnea may carry additional perioperative morbidity. Patients with significant obstructive sleep apnea (typically, in the moderate to severe degree) should receive, if possible, perioperative PAP (positive airway pressure) therapy and the surgeons and particularly the anesthesiologists should be informed of the diagnosis and the severity of the sleep disordered breathing.   I will likely see you back after your sleep study to go over the test results and where to go from there. We will call you after your sleep study to advise about the results (most likely, you will hear from Megan, my nurse) and to set up an appointment at the time, as necessary.    Our sleep lab administrative assistant will call you to schedule your sleep study and give you further instructions, regarding the check in process for the sleep study, arrival time, what to bring, when you can expect to leave after the study, etc., and to answer any other logistical questions you may have. If you don't hear back from her by about 2 weeks from now, please feel free to call her direct line at [phone] or you can call our general clinic number, or email us through My Chart.    

## 2021-04-05 NOTE — Progress Notes (Signed)
Carelink Summary Report / Loop Recorder 

## 2021-04-21 LAB — CUP PACEART REMOTE DEVICE CHECK
Date Time Interrogation Session: 20220923020850
Implantable Pulse Generator Implant Date: 20210609

## 2021-04-25 ENCOUNTER — Telehealth: Payer: Self-pay | Admitting: Neurology

## 2021-04-25 ENCOUNTER — Ambulatory Visit (INDEPENDENT_AMBULATORY_CARE_PROVIDER_SITE_OTHER): Payer: Medicare HMO

## 2021-04-25 DIAGNOSIS — I639 Cerebral infarction, unspecified: Secondary | ICD-10-CM

## 2021-04-25 NOTE — Telephone Encounter (Signed)
I returned the patient's call.  She sees where the sleep tests have been ordered.  She states she was told the insurance off would be completed and then she will be called to schedule.  Looks like these were ordered on 04/04/21.  Since I do not see an update quite yet, I did let the patient know I would send a message to our sleep team to check.  She verbalized appreciation for the call.

## 2021-04-25 NOTE — Telephone Encounter (Signed)
Pt states she received a mychart message but isn't clear on what it means for her.  Please call

## 2021-05-06 NOTE — Progress Notes (Signed)
Carelink Summary Report / Loop Recorder 

## 2021-05-10 DIAGNOSIS — M542 Cervicalgia: Secondary | ICD-10-CM | POA: Diagnosis not present

## 2021-05-10 DIAGNOSIS — M9905 Segmental and somatic dysfunction of pelvic region: Secondary | ICD-10-CM | POA: Diagnosis not present

## 2021-05-10 DIAGNOSIS — M9902 Segmental and somatic dysfunction of thoracic region: Secondary | ICD-10-CM | POA: Diagnosis not present

## 2021-05-10 DIAGNOSIS — M5136 Other intervertebral disc degeneration, lumbar region: Secondary | ICD-10-CM | POA: Diagnosis not present

## 2021-05-10 DIAGNOSIS — M9903 Segmental and somatic dysfunction of lumbar region: Secondary | ICD-10-CM | POA: Diagnosis not present

## 2021-05-10 DIAGNOSIS — M546 Pain in thoracic spine: Secondary | ICD-10-CM | POA: Diagnosis not present

## 2021-05-10 DIAGNOSIS — M9901 Segmental and somatic dysfunction of cervical region: Secondary | ICD-10-CM | POA: Diagnosis not present

## 2021-05-15 ENCOUNTER — Encounter: Payer: Self-pay | Admitting: Adult Health

## 2021-05-15 ENCOUNTER — Ambulatory Visit: Payer: Medicare HMO | Admitting: Adult Health

## 2021-05-15 VITALS — BP 149/72 | HR 67 | Ht 64.0 in | Wt 118.0 lb

## 2021-05-15 DIAGNOSIS — F419 Anxiety disorder, unspecified: Secondary | ICD-10-CM | POA: Diagnosis not present

## 2021-05-15 DIAGNOSIS — I69398 Other sequelae of cerebral infarction: Secondary | ICD-10-CM

## 2021-05-15 DIAGNOSIS — I639 Cerebral infarction, unspecified: Secondary | ICD-10-CM | POA: Diagnosis not present

## 2021-05-15 DIAGNOSIS — F0630 Mood disorder due to known physiological condition, unspecified: Secondary | ICD-10-CM

## 2021-05-15 DIAGNOSIS — R69 Illness, unspecified: Secondary | ICD-10-CM | POA: Diagnosis not present

## 2021-05-15 MED ORDER — SERTRALINE HCL 50 MG PO TABS: 75.0000 mg | ORAL_TABLET | Freq: Every day | ORAL | 5 refills | Status: AC

## 2021-05-15 NOTE — Patient Instructions (Signed)
Increase Zoloft from 50mg  to 75mg  daily  You will be called to set up evaluation with behavioral health  Complete sleep study next week to look for sleep apnea  Continue clopidogrel 75 mg daily  and atorvastatin 40mg  daily  for secondary stroke prevention  Continue to follow up with PCP regarding cholesterol and blood pressure management  Maintain strict control of hypertension with blood pressure goal below 130/90 and cholesterol with LDL cholesterol (bad cholesterol) goal below 70 mg/dL.       Followup in the future with me in 6 months or call earlier if needed       Thank you for coming to see [place] at [facility]. I hope we have been able to provide you high quality care today.  You may receive a patient satisfaction survey over the next few weeks. We would appreciate your feedback and comments so that we may continue to improve ourselves and the health of our patients.

## 2021-05-15 NOTE — Progress Notes (Signed)
[facility] [address] [place]. [place] [address] [phone]       OFFICE FOLLOW-UP NOTE  [name] Date of Birth:  [birth year] Medical Record Number:  [id number]    Reason for visit: Stroke follow-up  Chief Complaint  Patient presents with   Follow-up    RM 3 with daughter Doris Ford  Pt is well and stable, still having some fatigue and easily agitated but no new concerns or symptoms       HPI:   Update 05/15/2021 JM: returns for 4 moth stroke follow up accompanied by her daughter, Doris Ford. Overall stable from stroke standpoint. Denies new or reoccurring stroke/TIA symptoms.  She does continue to experience daytime fatigue as well as likely underlying anxiety with increased agitation (although does not believe she has anxiety or depression).  She has remained on sertraline 50 mg daily.  She plans on establishing care with new PCP Doris Ford on 11/8.  Scheduled to undergo HST next week to evaluate for sleep apnea.  TSH and B12 levels normal.  Compliant on Plavix and atorvastatin without side effects.  Blood pressure today 149/72.  No further concerns at this time.      History provided for reference purposes only Update 01/22/2021 JM: [name] returns for 12-month stroke follow-up accompanied by her daughter, Doris Ford.  She has been stable from stroke standpoint without new or reoccurring stroke/TIA symptoms.  Compliant on Plavix and atorvastatin without associated side effects.  Blood pressure 157/79.  Loop recorder has not shown atrial fibrillation thus far.  Initiated sertraline 25 mg nightly for worsening anxiety with increase to 50 mg nightly by PCP in March which she reports has greatly improved.  She continues to experience mild anxiety only on occasion.  She also continues to struggle with daytime fatigue and has been trying to keep active during the day with exercising 5 times weekly at her local gym and constantly keeping active.  She reports sleeping  well at night.  She is not aware of any snoring or witnessed apneas.  No further concerns at this time  Update 09/05/2020 JM: [name] returns for 49-month stroke follow-up. She has been doing well since prior visit without new recurrent stroke/TIA symptoms. Denies residual focal deficits but does report continued fatigue, lack of energy and anxiety. She has been slowly increasing daily exercise hoping this would help with her fatigue but she continues to struggle on a daily basis. She has remained on atorvastatin and Plavix without side effects. Blood pressure today 142/74. Loop recorder has not shown evidence of atrial fibrillation thus far.  No further concerns at this time.  Initial visit 03/05/2020 Doris Ford: [name] is a pleasant 80 year old Caucasian lady seen today for initial office follow-up visit following hospital consultation for stroke in June 2021.  She is accompanied by her daughter.  History is obtained from them, review of electronic medical records and I personally reviewed imaging films in PACS. She has past medical history of hypertension, GI hemorrhage, osteopenia and COVID-19 infection.  She developed sudden onset of facial droop and weakness difficulty drinking water drooling from the corner of the mouth and dysarthria.  She presented outside time window for TPA.  CT scan of the head on admission showed no acute abnormalities.  MRI scan of the brain showed small right MCA territory infarcts involving right frontal operculum, insula and right parietal region.  There was tiny remote age right cerebellar infarct also noted.  CT angiogramof the brain and  neck showed only mild extracranial left carotid stenosis.  2D echo showed normal ejection fraction without cardiac source of embolism.  Lower extremity Dopplers were negative for DVT.  LDL cholesterol was elevated at 115 mg percent and hemoglobin A1c was 5.9.  She had a loop recorder inserted and so for paroxysmal A. fib has not been  found.  She was not discharged on aspirin Plavix and stop aspirin is currently on Plavix which is tolerating well with only minor bruising.  She is also on Lipitor which is tolerating well without muscle aches and pains.  Blood pressures well controlled today it is 110/70.  She states that her facial weakness, dysarthria and swallowing difficulties have all resolved completely and she has no residual deficits from the stroke.  She however feels quite anxious and edgy and is at times has been sharp with people which is new for her.  She denies any prior known history of strokes prior to this event.    ROS:   14 system review of systems is positive for those listed in HPI and all other systems negative   PMH:  Past Medical History:  Diagnosis Date   Arthritis    Breast cancer ([place]) 06/2006   right breast/tx with lumpectomy and tamoxifen   COVID-19    Gastric ulcer    no NSAIDs   Hypertension    under control   Interstitial cystitis    Osteopenia    no change    Social History:  Social History   Socioeconomic History   Marital status: Married    Spouse name: Not on file   Number of children: Not on file   Years of education: Not on file   Highest education level: Not on file  Occupational History   Not on file  Tobacco Use   Smoking status: Never   Smokeless tobacco: Never  Vaping Use   Vaping Use: Never used  Substance and Sexual Activity   Alcohol use: No   Drug use: No   Sexual activity: Not Currently    Partners: Male    Birth control/protection: Surgical    Comment: TVH  Other Topics Concern   Not on file  Social History Narrative   Not on file   Social Determinants of Health   Financial Resource Strain: Not on file  Food Insecurity: Not on file  Transportation Needs: Not on file  Physical Activity: Not on file  Stress: Not on file  Social Connections: Not on file  Intimate Partner Violence: Not on file    Medications:   Current Outpatient Medications  on File Prior to Visit  Medication Sig Dispense Refill   acetaminophen (TYLENOL) 650 MG CR tablet Take 650-1,300 mg by mouth 2 (two) times daily as needed for pain.     amLODipine (NORVASC) 5 MG tablet Take 5 mg by mouth daily.     Ascorbic Acid (VITAMIN C) 1000 MG tablet Take 1,000 mg by mouth daily.     atorvastatin (LIPITOR) 40 MG tablet Take 1 tablet (40 mg total) by mouth daily. 30 tablet 1   Calcium-Vitamin D (CALTRATE 600 PLUS-VIT D PO) Take 1 tablet by mouth daily.      Cholecalciferol (VITAMIN D3) 50 MCG (2000 UT) TABS Take 2,000 Units by mouth daily.     clopidogrel (PLAVIX) 75 MG tablet Take 1 tablet (75 mg total) by mouth daily. 30 tablet 1   hydrochlorothiazide (HYDRODIURIL) 12.5 MG tablet Take 12.5 mg by mouth daily.  loratadine (CLARITIN) 10 MG tablet Take 10 mg by mouth daily.     NON FORMULARY Take 1 capsule by mouth daily. Cysta-Q     Polyethyl Glyc-Propyl Glyc PF (SYSTANE HYDRATION PF) 0.4-0.3 % SOLN Apply to eye.     polyethylene glycol (MIRALAX / GLYCOLAX) 17 g packet Take 17 g by mouth daily as needed for mild constipation. 14 each 0   quinapril (ACCUPRIL) 20 MG tablet Take 1 tablet (20 mg total) by mouth daily. (Patient taking differently: No sig reported)     sertraline (ZOLOFT) 50 MG tablet Take 1 tablet (50 mg total) by mouth daily. 30 tablet 0   No current facility-administered medications on file prior to visit.    Allergies:   Allergies  Allergen Reactions   Codeine     Severe nausea.  Dizziness.   Macrobid [Nitrofurantoin] Other (See Comments)    Skin eruption    Physical Exam Today's Vitals   05/15/21 1552  BP: (!) 149/72  Pulse: 67  Weight: 118 lb (53.5 kg)  Height: 5\' 4"  (1.626 m)    Body mass index is 20.25 kg/m.   General: Frail very pleasant elderly Caucasian lady, seated, in no evident distress Head: head normocephalic and atraumatic.  Neck: supple with no carotid or supraclavicular bruits Cardiovascular: regular rate and rhythm, no  murmurs Musculoskeletal: no deformity Skin:  no rash/petichiae Vascular:  Normal pulses all extremities  Neurologic Exam Mental Status: Awake and fully alert.  Fluent speech and language.  Oriented to place and time. Recent and remote memory intact. Attention span, concentration and fund of knowledge appropriate. Mood and affect appropriate.  Cranial Nerves: Pupils equal, briskly reactive to light. Extraocular movements full without nystagmus. Visual fields full to confrontation. Hearing slightly diminished bilaterally.. Facial sensation intact. Face, tongue, palate moves normally and symmetrically.  Motor: Normal bulk and tone. Normal strength in all tested extremity muscles. Sensory.: intact to touch ,pinprick .position and vibratory sensation.  Coordination: Rapid alternating movements normal in all extremities. Finger-to-nose and heel-to-shin performed accurately bilaterally. Gait and Station: Arises from chair without difficulty. Stance is normal. Gait demonstrates normal stride length and balance . Able to heel, toe and tandem walk with mild difficulty.  Reflexes: 1+ and symmetric. Toes downgoing.      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     R frontal MCA stroke, cryptogenic -Loop recorder has not shown atrial fibrillation thus far -personally reviewed reports per cardiology -Continue Plavix and atorvastatin 40 mg daily for secondary stroke prevention -Discussed secondary stroke prevention measures and importance of close PCP follow-up for aggressive stroke risk factor management -HTN: BP goal<130/90.  Satisfactory on amlodipine, hydrochlorothiazide and Accupril per PCP -HLD: LDL goal<70. On atorvastatin 40 mg daily per PCP with recent LDL 62  (09/2020)  Anxiety with depression -Reports onset with stroke -denies prior difficulties -Recommend increasing sertraline from 50 mg to 75 mg daily -Referral placed to behavioral health   Excessive daytime fatigue -Likely multifactorial -B12 and TSH within normal limits. -Schedule for HST next week -If sleep apnea testing negative, would recommend further evaluation by PCP     Follow-up in 6 months or call earlier if needed    CC:  Celene Squibb, MD   I spent 32 minutes of face-to-face and non-face-to-face time with patient and daughter.  This included previsit  chart review, lab review, study review, order entry, electronic health record documentation, patient education and discussion regarding history of prior stroke including secondary stroke prevention and aggressive stroke risk factor management, anxiety with depression, excessive daytime fatigue and possible causes, monitoring of loop recorder, and answered all other questions to patient and daughters satisfaction  Frann Rider, AGNP-BC  [facility] [address] [place], [place] [address]  Phone [phone] Fax [phone] Note: This document was prepared with digital dictation and possible smart phrase technology. Any transcriptional errors that result from this process are unintentional.

## 2021-05-16 DIAGNOSIS — H04123 Dry eye syndrome of bilateral lacrimal glands: Secondary | ICD-10-CM | POA: Diagnosis not present

## 2021-05-22 ENCOUNTER — Ambulatory Visit (INDEPENDENT_AMBULATORY_CARE_PROVIDER_SITE_OTHER): Payer: Medicare HMO | Admitting: Neurology

## 2021-05-22 DIAGNOSIS — R351 Nocturia: Secondary | ICD-10-CM

## 2021-05-22 DIAGNOSIS — G4719 Other hypersomnia: Secondary | ICD-10-CM

## 2021-05-22 DIAGNOSIS — G4733 Obstructive sleep apnea (adult) (pediatric): Secondary | ICD-10-CM | POA: Diagnosis not present

## 2021-05-22 DIAGNOSIS — I639 Cerebral infarction, unspecified: Secondary | ICD-10-CM

## 2021-05-22 DIAGNOSIS — R5383 Other fatigue: Secondary | ICD-10-CM

## 2021-05-22 LAB — CUP PACEART REMOTE DEVICE CHECK
Date Time Interrogation Session: 20221026021246
Implantable Pulse Generator Implant Date: 20210609

## 2021-05-27 NOTE — Progress Notes (Signed)
See procedure note.

## 2021-05-28 ENCOUNTER — Ambulatory Visit (INDEPENDENT_AMBULATORY_CARE_PROVIDER_SITE_OTHER): Payer: Medicare HMO

## 2021-05-28 DIAGNOSIS — I639 Cerebral infarction, unspecified: Secondary | ICD-10-CM | POA: Diagnosis not present

## 2021-05-29 ENCOUNTER — Encounter: Payer: Self-pay | Admitting: Neurology

## 2021-05-29 ENCOUNTER — Telehealth: Payer: Self-pay | Admitting: Neurology

## 2021-05-29 NOTE — Addendum Note (Signed)
Addended by: Star Age on: 05/29/2021 10:41 AM   Modules accepted: Orders

## 2021-05-29 NOTE — Procedures (Signed)
   [facility]  HOME SLEEP TEST (Watch PAT) REPORT  STUDY DATE: 05/22/2021  DOB: 1940-12-25  MRN: [id number]  ORDERING CLINICIAN: Star Age, MD, PhD   REFERRING CLINICIAN: Dr. [name]  CLINICAL INFORMATION/HISTORY: 80 year old right-handed woman with an underlying medical history of arthritis, breast cancer, gastric ulcer, hypertension, interstitial cystitis, osteopenia, and stroke, who reports snoring and excessive daytime somnolence.   Epworth sleepiness score: 4/24.  BMI: 19.6 kg/m  FINDINGS:   Sleep Summary:   Total Recording Time (hours, min): 10 hours, 45 minutes  Total Sleep Time (hours, min):  9 hours, 39 minutes   Percent REM (%):    15.7%   Respiratory Indices:   Calculated pAHI (per hour):  14.3/hour         REM pAHI:    13.9/hour       NREM pAHI: 14.4/hour  Oxygen Saturation Statistics:    Oxygen Saturation (%) Mean: 93%   Minimum oxygen saturation (%):                 80%   O2 Saturation Range (%): 80-97%    O2 Saturation (minutes) <=88%: 2.4 min  Pulse Rate Statistics:   Pulse Mean (bpm):    66/min    Pulse Range (48-93/min)   IMPRESSION: OSA (obstructive sleep apnea), mild  RECOMMENDATION:  This home sleep test demonstrates overall mild obstructive sleep apnea with a total AHI of 14.3/hour and O2 nadir of 80%. Given the patient's medical history and sleep related complaints, treatment with positive airway pressure can be considered; this can be achieved in the form of autoPAP trial/titration at home. A  full night CPAP titration study will help with proper treatment settings and mask fitting if needed. Alternative treatments may include an oral appliance in appropriate candidates.   Please note that untreated obstructive sleep apnea may carry additional perioperative morbidity. Patients with significant obstructive sleep apnea should receive perioperative PAP therapy and the surgeons and particularly the anesthesiologist  should be informed of the diagnosis and the severity of the sleep disordered breathing. The patient should be cautioned not to drive, work at heights, or operate dangerous or heavy equipment when tired or sleepy. Review and reiteration of good sleep hygiene measures should be pursued with any patient. Other causes of the patient's symptoms, including circadian rhythm disturbances, an underlying mood disorder, medication effect and/or an underlying medical problem cannot be ruled out based on this test. Clinical correlation is recommended.   The patient and her referring provider will be notified of the test results. The patient will be seen in follow up in sleep clinic at [facility].  I certify that I have reviewed the raw data recording prior to the issuance of this report in accordance with the standards of the American Academy of Sleep Medicine (AASM).  INTERPRETING PHYSICIAN:   Star Age, MD, PhD  Board Certified in Neurology and Sleep Medicine

## 2021-05-29 NOTE — Telephone Encounter (Signed)
Called patient to discuss sleep study results. No answer at this time. LVM for the patient to call back.  Will also send a mychart message.  

## 2021-05-29 NOTE — Telephone Encounter (Signed)
-----   Message from Star Age, MD sent at 05/29/2021 10:41 AM EDT ----- Patient referred by Dr. [name], seen by me on 04/04/21, HST 05/22/21.    Please call and notify the patient that the recent home sleep test showed obstructive sleep apnea. OSA is overall mild, but worth treating to see if she feels better after treatment. To that end I recommend treatment for this in the form of autoPAP, which means, that we don't have to bring her in for a sleep study with CPAP, but will let her try an autoPAP machine at home, through a DME company (of her choice, or as per insurance requirement). The DME representative will educate her on how to use the machine, how to put the mask on, etc. I have placed an order in the chart. Please send referral, talk to patient, send report to referring MD. We will need a FU in sleep clinic for 10 weeks post-PAP set up, please arrange that with me or one of our NPs.   Thanks,   Star Age, MD, PhD [facility])

## 2021-05-30 ENCOUNTER — Ambulatory Visit: Payer: Medicare HMO | Admitting: Adult Health

## 2021-05-30 ENCOUNTER — Encounter: Payer: Self-pay | Admitting: *Deleted

## 2021-05-30 NOTE — Telephone Encounter (Signed)
-----   Message from Star Age, MD sent at 05/29/2021 10:41 AM EDT ----- Patient referred by Dr. [name], seen by me on 04/04/21, HST 05/22/21.    Please call and notify the patient that the recent home sleep test showed obstructive sleep apnea. OSA is overall mild, but worth treating to see if she feels better after treatment. To that end I recommend treatment for this in the form of autoPAP, which means, that we don't have to bring her in for a sleep study with CPAP, but will let her try an autoPAP machine at home, through a DME company (of her choice, or as per insurance requirement). The DME representative will educate her on how to use the machine, how to put the mask on, etc. I have placed an order in the chart. Please send referral, talk to patient, send report to referring MD. We will need a FU in sleep clinic for 10 weeks post-PAP set up, please arrange that with me or one of our NPs.   Thanks,   Star Age, MD, PhD [facility])

## 2021-05-30 NOTE — Telephone Encounter (Signed)
I called pt. I advised pt that Dr. Rexene Alberts reviewed their sleep study results and found that pt had mild OSA. Dr. Rexene Alberts recommends that pt start autopap. I reviewed PAP compliance expectations with the pt. Pt is agreeable to starting an auto-PAP. I advised pt that an order will be sent to a DME, [employer], and they will call the pt within about one week after they file with the pt's insurance. [facility] will show the pt how to use the machine, fit for masks, and troubleshoot the auto-PAP if needed. A follow up appt was made for insurance purposes with Dr. Rexene Alberts on 08-27-2021 at 1:15pm . Pt verbalized understanding to arrive 15 minutes early and bring their auto-PAP. A letter with all of this information in it will be mailed to the pt as a reminder. I verified with the pt that the address we have on file is correct. Pt verbalized understanding of results. Pt had no questions at this time but was encouraged to call back if questions arise. I have sent the order to [employer] and have received confirmation that they have received the order.

## 2021-06-04 NOTE — Progress Notes (Signed)
Carelink Summary Report / Loop Recorder 

## 2021-06-05 DIAGNOSIS — G4730 Sleep apnea, unspecified: Secondary | ICD-10-CM | POA: Diagnosis not present

## 2021-06-05 DIAGNOSIS — M84374D Stress fracture, right foot, subsequent encounter for fracture with routine healing: Secondary | ICD-10-CM | POA: Diagnosis not present

## 2021-06-05 DIAGNOSIS — K922 Gastrointestinal hemorrhage, unspecified: Secondary | ICD-10-CM | POA: Diagnosis not present

## 2021-06-05 DIAGNOSIS — H9193 Unspecified hearing loss, bilateral: Secondary | ICD-10-CM | POA: Diagnosis not present

## 2021-06-05 DIAGNOSIS — K2210 Ulcer of esophagus without bleeding: Secondary | ICD-10-CM | POA: Diagnosis not present

## 2021-06-05 DIAGNOSIS — Z8673 Personal history of transient ischemic attack (TIA), and cerebral infarction without residual deficits: Secondary | ICD-10-CM | POA: Diagnosis not present

## 2021-06-05 DIAGNOSIS — H9190 Unspecified hearing loss, unspecified ear: Secondary | ICD-10-CM | POA: Insufficient documentation

## 2021-06-05 DIAGNOSIS — R69 Illness, unspecified: Secondary | ICD-10-CM | POA: Diagnosis not present

## 2021-06-05 DIAGNOSIS — M545 Low back pain, unspecified: Secondary | ICD-10-CM | POA: Diagnosis not present

## 2021-06-05 DIAGNOSIS — R008 Other abnormalities of heart beat: Secondary | ICD-10-CM | POA: Diagnosis not present

## 2021-06-05 DIAGNOSIS — I10 Essential (primary) hypertension: Secondary | ICD-10-CM | POA: Diagnosis not present

## 2021-06-05 DIAGNOSIS — F32A Depression, unspecified: Secondary | ICD-10-CM | POA: Insufficient documentation

## 2021-06-24 ENCOUNTER — Ambulatory Visit (INDEPENDENT_AMBULATORY_CARE_PROVIDER_SITE_OTHER): Payer: Medicare HMO

## 2021-06-24 DIAGNOSIS — I639 Cerebral infarction, unspecified: Secondary | ICD-10-CM

## 2021-06-24 LAB — CUP PACEART REMOTE DEVICE CHECK
Date Time Interrogation Session: 20221128011839
Implantable Pulse Generator Implant Date: 20210609

## 2021-07-02 NOTE — Progress Notes (Signed)
Carelink Summary Report / Loop Recorder 

## 2021-07-04 DIAGNOSIS — R5383 Other fatigue: Secondary | ICD-10-CM | POA: Diagnosis not present

## 2021-07-04 DIAGNOSIS — Z8673 Personal history of transient ischemic attack (TIA), and cerebral infarction without residual deficits: Secondary | ICD-10-CM | POA: Diagnosis not present

## 2021-07-09 ENCOUNTER — Other Ambulatory Visit (HOSPITAL_COMMUNITY): Payer: Self-pay | Admitting: Family Medicine

## 2021-07-09 DIAGNOSIS — I10 Essential (primary) hypertension: Secondary | ICD-10-CM | POA: Diagnosis not present

## 2021-07-09 DIAGNOSIS — R69 Illness, unspecified: Secondary | ICD-10-CM | POA: Diagnosis not present

## 2021-07-09 DIAGNOSIS — M545 Low back pain, unspecified: Secondary | ICD-10-CM | POA: Diagnosis not present

## 2021-07-09 DIAGNOSIS — K922 Gastrointestinal hemorrhage, unspecified: Secondary | ICD-10-CM | POA: Diagnosis not present

## 2021-07-09 DIAGNOSIS — M1990 Unspecified osteoarthritis, unspecified site: Secondary | ICD-10-CM | POA: Insufficient documentation

## 2021-07-09 DIAGNOSIS — K2210 Ulcer of esophagus without bleeding: Secondary | ICD-10-CM | POA: Diagnosis not present

## 2021-07-09 DIAGNOSIS — R008 Other abnormalities of heart beat: Secondary | ICD-10-CM | POA: Diagnosis not present

## 2021-07-09 DIAGNOSIS — H9193 Unspecified hearing loss, bilateral: Secondary | ICD-10-CM | POA: Diagnosis not present

## 2021-07-09 DIAGNOSIS — Z8673 Personal history of transient ischemic attack (TIA), and cerebral infarction without residual deficits: Secondary | ICD-10-CM | POA: Diagnosis not present

## 2021-07-09 DIAGNOSIS — M8580 Other specified disorders of bone density and structure, unspecified site: Secondary | ICD-10-CM

## 2021-07-09 DIAGNOSIS — G4730 Sleep apnea, unspecified: Secondary | ICD-10-CM | POA: Diagnosis not present

## 2021-07-09 DIAGNOSIS — M84374D Stress fracture, right foot, subsequent encounter for fracture with routine healing: Secondary | ICD-10-CM | POA: Diagnosis not present

## 2021-07-17 DIAGNOSIS — U071 COVID-19: Secondary | ICD-10-CM | POA: Diagnosis not present

## 2021-07-17 DIAGNOSIS — R112 Nausea with vomiting, unspecified: Secondary | ICD-10-CM | POA: Diagnosis not present

## 2021-07-17 DIAGNOSIS — R490 Dysphonia: Secondary | ICD-10-CM | POA: Diagnosis not present

## 2021-07-17 DIAGNOSIS — R6883 Chills (without fever): Secondary | ICD-10-CM | POA: Diagnosis not present

## 2021-07-17 DIAGNOSIS — R059 Cough, unspecified: Secondary | ICD-10-CM | POA: Diagnosis not present

## 2021-07-17 DIAGNOSIS — M791 Myalgia, unspecified site: Secondary | ICD-10-CM | POA: Diagnosis not present

## 2021-07-24 ENCOUNTER — Other Ambulatory Visit (HOSPITAL_COMMUNITY): Payer: Medicare HMO

## 2021-07-30 ENCOUNTER — Ambulatory Visit (INDEPENDENT_AMBULATORY_CARE_PROVIDER_SITE_OTHER): Payer: Medicare HMO

## 2021-07-30 DIAGNOSIS — I639 Cerebral infarction, unspecified: Secondary | ICD-10-CM | POA: Diagnosis not present

## 2021-07-30 LAB — CUP PACEART REMOTE DEVICE CHECK
Date Time Interrogation Session: 20230102230805
Implantable Pulse Generator Implant Date: 20210609

## 2021-08-05 ENCOUNTER — Ambulatory Visit (HOSPITAL_COMMUNITY)
Admission: RE | Admit: 2021-08-05 | Discharge: 2021-08-05 | Disposition: A | Discharge status: Home / Self Care | Payer: Medicare HMO | Source: Ambulatory Visit | Attending: Family Medicine | Admitting: Family Medicine

## 2021-08-05 ENCOUNTER — Other Ambulatory Visit: Payer: Self-pay

## 2021-08-05 DIAGNOSIS — Z780 Asymptomatic menopausal state: Secondary | ICD-10-CM | POA: Diagnosis not present

## 2021-08-05 DIAGNOSIS — Z853 Personal history of malignant neoplasm of breast: Secondary | ICD-10-CM | POA: Diagnosis not present

## 2021-08-05 DIAGNOSIS — Z13820 Encounter for screening for osteoporosis: Secondary | ICD-10-CM | POA: Insufficient documentation

## 2021-08-05 DIAGNOSIS — M8589 Other specified disorders of bone density and structure, multiple sites: Secondary | ICD-10-CM | POA: Insufficient documentation

## 2021-08-05 DIAGNOSIS — M8580 Other specified disorders of bone density and structure, unspecified site: Secondary | ICD-10-CM

## 2021-08-09 NOTE — Progress Notes (Signed)
Carelink Summary Report / Loop Recorder 

## 2021-08-27 ENCOUNTER — Ambulatory Visit: Payer: Medicare HMO | Admitting: Neurology

## 2021-08-29 DIAGNOSIS — G4733 Obstructive sleep apnea (adult) (pediatric): Secondary | ICD-10-CM | POA: Diagnosis not present

## 2021-08-30 ENCOUNTER — Telehealth: Payer: Self-pay | Admitting: Neurology

## 2021-09-02 ENCOUNTER — Ambulatory Visit (INDEPENDENT_AMBULATORY_CARE_PROVIDER_SITE_OTHER): Payer: Medicare HMO

## 2021-09-02 DIAGNOSIS — I639 Cerebral infarction, unspecified: Secondary | ICD-10-CM

## 2021-09-03 LAB — CUP PACEART REMOTE DEVICE CHECK
Date Time Interrogation Session: 20230205231118
Implantable Pulse Generator Implant Date: 20210609

## 2021-09-05 NOTE — Progress Notes (Signed)
Carelink Summary Report / Loop Recorder 

## 2021-09-11 NOTE — Telephone Encounter (Signed)
Error

## 2021-09-27 ENCOUNTER — Ambulatory Visit
Admission: EM | Admit: 2021-09-27 | Discharge: 2021-09-27 | Disposition: A | Discharge status: Home / Self Care | Payer: Medicare HMO | Attending: Urgent Care | Admitting: Urgent Care

## 2021-09-27 ENCOUNTER — Other Ambulatory Visit: Payer: Self-pay

## 2021-09-27 ENCOUNTER — Encounter: Payer: Self-pay | Admitting: Emergency Medicine

## 2021-09-27 DIAGNOSIS — H6981 Other specified disorders of Eustachian tube, right ear: Secondary | ICD-10-CM

## 2021-09-27 DIAGNOSIS — H938X1 Other specified disorders of right ear: Secondary | ICD-10-CM | POA: Diagnosis not present

## 2021-09-27 MED ORDER — CETIRIZINE HCL 1 MG/ML PO SOLN: 5.0000 mg | Freq: Every day | ORAL | 0 refills | Status: DC

## 2021-09-27 MED ORDER — FLUTICASONE PROPIONATE 50 MCG/ACT NA SUSP: 2.0000 | Freq: Every day | NASAL | 12 refills | Status: DC

## 2021-09-27 MED ORDER — PREDNISONE 10 MG PO TABS: 30.0000 mg | ORAL_TABLET | Freq: Every day | ORAL | 0 refills | Status: DC

## 2021-09-27 NOTE — ED Triage Notes (Signed)
Pt reports right ear fullness "like something is in it" since last night. Pt reports sinus congestion for last several days and reports "my ear feels different that what it has been." ?

## 2021-09-27 NOTE — ED Provider Notes (Signed)
?[place] ? ? ?MRN: [id number] DOB: 06-13-1941 ? ?Subjective:  ? ?Doris Ford is a 81 y.o. female presenting for 1 day history of fullness of the right ear as if something is obstructing it.  Has had intermittent drainage and sinus congestion for the past week.  No tinnitus, dizziness, ear pain, ear drainage.  She uses hearing aids but does not feel like she is doing anything different with them. ? ?No current facility-administered medications for this encounter. ? ?Current Outpatient Medications:  ?  acetaminophen (TYLENOL) 650 MG CR tablet, Take 650-1,300 mg by mouth 2 (two) times daily as needed for pain., Disp: , Rfl:  ?  amLODipine (NORVASC) 5 MG tablet, Take 5 mg by mouth daily., Disp: , Rfl:  ?  Ascorbic Acid (VITAMIN C) 1000 MG tablet, Take 1,000 mg by mouth daily., Disp: , Rfl:  ?  atorvastatin (LIPITOR) 40 MG tablet, Take 1 tablet (40 mg total) by mouth daily., Disp: 30 tablet, Rfl: 1 ?  Calcium-Vitamin D (CALTRATE 600 PLUS-VIT D PO), Take 1 tablet by mouth daily. , Disp: , Rfl:  ?  Cholecalciferol (VITAMIN D3) 50 MCG (2000 UT) TABS, Take 2,000 Units by mouth daily., Disp: , Rfl:  ?  clopidogrel (PLAVIX) 75 MG tablet, Take 1 tablet (75 mg total) by mouth daily., Disp: 30 tablet, Rfl: 1 ?  hydrochlorothiazide (HYDRODIURIL) 12.5 MG tablet, Take 12.5 mg by mouth daily., Disp: , Rfl:  ?  loratadine (CLARITIN) 10 MG tablet, Take 10 mg by mouth daily., Disp: , Rfl:  ?  NON FORMULARY, Take 1 capsule by mouth daily. Cysta-Q, Disp: , Rfl:  ?  Polyethyl Glyc-Propyl Glyc PF (SYSTANE HYDRATION PF) 0.4-0.3 % SOLN, Apply to eye., Disp: , Rfl:  ?  polyethylene glycol (MIRALAX / GLYCOLAX) 17 g packet, Take 17 g by mouth daily as needed for mild constipation., Disp: 14 each, Rfl: 0 ?  quinapril (ACCUPRIL) 20 MG tablet, Take 1 tablet (20 mg total) by mouth daily. (Patient taking differently: No sig reported), Disp: , Rfl:  ?  sertraline (ZOLOFT) 50 MG tablet, Take 1.5 tablets (75 mg total) by mouth  daily., Disp: 45 tablet, Rfl: 5  ? ?Allergies  ?Allergen Reactions  ? Codeine   ?  Severe nausea.  Dizziness.  ? Macrobid [Nitrofurantoin] Other (See Comments)  ?  Skin eruption  ? ? ?Past Medical History:  ?Diagnosis Date  ? Arthritis   ? Breast cancer ([place]) 06/2006  ? right breast/tx with lumpectomy and tamoxifen  ? COVID-19   ? Gastric ulcer   ? no NSAIDs  ? Hypertension   ? under control  ? Interstitial cystitis   ? Osteopenia   ? no change  ?  ? ?Past Surgical History:  ?Procedure Laterality Date  ? BILATERAL SALPINGOOPHORECTOMY  age 2s  ? BIOPSY  05/31/2019  ? Procedure: BIOPSY;  Surgeon: Daneil Dolin, MD;  Location: [facility];  Service: Endoscopy;;  ? BREAST SURGERY Right   ? lumpectomy-multiple times  ? BUNIONECTOMY WITH HAMMERTOE RECONSTRUCTION Bilateral ~2000  ? CATARACT EXTRACTION Bilateral 2007  ? ESOPHAGOGASTRODUODENOSCOPY N/A 05/31/2019  ? Dr. Gala Romney: widely patent Schatzki ring, non-bleeding cratered ulcer with adherent clot injected/thermally sealed. motled gastric mucosa with benign biopsy negative for H.pylori. plans for 3 month surveillance EGD.  ? ESOPHAGOGASTRODUODENOSCOPY N/A 10/05/2019  ? Procedure: ESOPHAGOGASTRODUODENOSCOPY (EGD);  Surgeon: Daneil Dolin, MD;  Location: [facility];  Service: Endoscopy;  Laterality: N/A;  2:00pm - moved up per office  ?  KNEE SURGERY Bilateral as teen  ? LOOP RECORDER INSERTION N/A 01/04/2020  ? Procedure: LOOP RECORDER INSERTION;  Surgeon: Constance Haw, MD;  Location: [facility];  Service: Cardiovascular;  Laterality: N/A;  ? TONSILLECTOMY AND ADENOIDECTOMY  age 54  ? TOTAL HIP ARTHROPLASTY Right 12/16/2013  ? Procedure: RIGHT TOTAL HIP ARTHROPLASTY ANTERIOR APPROACH;  Surgeon: Mcarthur Rossetti, MD;  Location: [facility];  Service: Orthopedics;  Laterality: Right;  ? TOTAL VAGINAL HYSTERECTOMY  age 69  ? ? ?Family History  ?Problem Relation Age of Onset  ? Cancer Mother   ?     breast/mastectomy  ? Heart disease Mother   ?     poor  circulation  ? Deep vein thrombosis Mother   ? Osteoporosis Mother   ? Hypertension Father   ? Colon cancer Neg Hx   ? Sleep apnea Neg Hx   ? ? ?Social History  ? ?Tobacco Use  ? Smoking status: Never  ? Smokeless tobacco: Never  ?Vaping Use  ? Vaping Use: Never used  ?Substance Use Topics  ? Alcohol use: No  ? Drug use: No  ? ? ?ROS ? ? ?Objective:  ? ?Vitals: ?BP (!) 149/76 (BP Location: Right Arm)   Pulse 82   Temp 97.7 ?F (36.5 ?C) (Oral)   Resp 18   Ht 5\' 4"  (1.626 m)   Wt 118 lb (53.5 kg)   LMP  (LMP Unknown)   SpO2 94%   BMI 20.25 kg/m?  ? ?Physical Exam ?Constitutional:   ?   General: She is not in acute distress. ?   Appearance: Normal appearance. She is well-developed and normal weight. She is not ill-appearing, toxic-appearing or diaphoretic.  ?HENT:  ?   Head: Normocephalic and atraumatic.  ?   Right Ear: Ear canal and external ear normal. No drainage or tenderness. No middle ear effusion. There is no impacted cerumen. Tympanic membrane is not erythematous.  ?   Left Ear: Tympanic membrane, ear canal and external ear normal. No drainage or tenderness.  No middle ear effusion. There is no impacted cerumen. Tympanic membrane is not erythematous.  ?   Ears:  ?   Comments: Air-fluid level of the right TM. ?   Nose: Congestion present. No rhinorrhea.  ?   Mouth/Throat:  ?   Mouth: Mucous membranes are moist. No oral lesions.  ?   Pharynx: No pharyngeal swelling, oropharyngeal exudate, posterior oropharyngeal erythema or uvula swelling.  ?   Tonsils: No tonsillar exudate or tonsillar abscesses.  ?Eyes:  ?   General: No scleral icterus.    ?   Right eye: No discharge.     ?   Left eye: No discharge.  ?   Extraocular Movements: Extraocular movements intact.  ?   Right eye: Normal extraocular motion.  ?   Left eye: Normal extraocular motion.  ?   Conjunctiva/sclera: Conjunctivae normal.  ?Cardiovascular:  ?   Rate and Rhythm: Normal rate.  ?Pulmonary:  ?   Effort: Pulmonary effort is normal.   ?Musculoskeletal:  ?   Cervical back: Normal range of motion and neck supple.  ?Lymphadenopathy:  ?   Cervical: No cervical adenopathy.  ?Skin: ?   General: Skin is warm and dry.  ?Neurological:  ?   General: No focal deficit present.  ?   Mental Status: She is alert and oriented to person, place, and time.  ?Psychiatric:     ?   Mood and Affect: Mood normal.     ?  Behavior: Behavior normal.  ? ? ?Assessment and Plan :  ? ?PDMP not reviewed this encounter. ? ?1. Eustachian tube dysfunction, right   ?2. Ear fullness, right   ? ?ENT exam without signs of otitis media and together with her HPI is consistent with eustachian tube dysfunction.  She is not a good candidate for pseudoephedrine.  However, I recommended starting Zyrtec and Flonase.  We will use 30 mg burst of prednisone for 5 days.  Follow-up with ENT thereafter if symptoms persist.  In the absence of fever, pain will defer antibiotic use.  Counseled patient on potential for adverse effects with medications prescribed/recommended today, ER and return-to-clinic precautions discussed, patient verbalized understanding. ? ?  ?Jaynee Eagles, PA-C ?09/27/21 1410 ? ?

## 2021-10-07 ENCOUNTER — Ambulatory Visit (INDEPENDENT_AMBULATORY_CARE_PROVIDER_SITE_OTHER): Payer: Medicare HMO

## 2021-10-07 DIAGNOSIS — M9901 Segmental and somatic dysfunction of cervical region: Secondary | ICD-10-CM | POA: Diagnosis not present

## 2021-10-07 DIAGNOSIS — M9903 Segmental and somatic dysfunction of lumbar region: Secondary | ICD-10-CM | POA: Diagnosis not present

## 2021-10-07 DIAGNOSIS — I639 Cerebral infarction, unspecified: Secondary | ICD-10-CM | POA: Diagnosis not present

## 2021-10-07 DIAGNOSIS — M9905 Segmental and somatic dysfunction of pelvic region: Secondary | ICD-10-CM | POA: Diagnosis not present

## 2021-10-07 DIAGNOSIS — M5136 Other intervertebral disc degeneration, lumbar region: Secondary | ICD-10-CM | POA: Diagnosis not present

## 2021-10-07 DIAGNOSIS — M542 Cervicalgia: Secondary | ICD-10-CM | POA: Diagnosis not present

## 2021-10-07 DIAGNOSIS — M546 Pain in thoracic spine: Secondary | ICD-10-CM | POA: Diagnosis not present

## 2021-10-07 DIAGNOSIS — M9902 Segmental and somatic dysfunction of thoracic region: Secondary | ICD-10-CM | POA: Diagnosis not present

## 2021-10-08 LAB — CUP PACEART REMOTE DEVICE CHECK
Date Time Interrogation Session: 20230312230758
Implantable Pulse Generator Implant Date: 20210609

## 2021-10-10 DIAGNOSIS — H6981 Other specified disorders of Eustachian tube, right ear: Secondary | ICD-10-CM | POA: Diagnosis not present

## 2021-10-10 DIAGNOSIS — H9113 Presbycusis, bilateral: Secondary | ICD-10-CM | POA: Diagnosis not present

## 2021-10-10 DIAGNOSIS — H903 Sensorineural hearing loss, bilateral: Secondary | ICD-10-CM | POA: Diagnosis not present

## 2021-10-11 DIAGNOSIS — M9902 Segmental and somatic dysfunction of thoracic region: Secondary | ICD-10-CM | POA: Diagnosis not present

## 2021-10-11 DIAGNOSIS — M9901 Segmental and somatic dysfunction of cervical region: Secondary | ICD-10-CM | POA: Diagnosis not present

## 2021-10-11 DIAGNOSIS — M542 Cervicalgia: Secondary | ICD-10-CM | POA: Diagnosis not present

## 2021-10-11 DIAGNOSIS — M9905 Segmental and somatic dysfunction of pelvic region: Secondary | ICD-10-CM | POA: Diagnosis not present

## 2021-10-11 DIAGNOSIS — M9903 Segmental and somatic dysfunction of lumbar region: Secondary | ICD-10-CM | POA: Diagnosis not present

## 2021-10-11 DIAGNOSIS — M5136 Other intervertebral disc degeneration, lumbar region: Secondary | ICD-10-CM | POA: Diagnosis not present

## 2021-10-11 DIAGNOSIS — M546 Pain in thoracic spine: Secondary | ICD-10-CM | POA: Diagnosis not present

## 2021-10-21 NOTE — Progress Notes (Signed)
Carelink Summary Report / Loop Recorder 

## 2021-11-11 ENCOUNTER — Ambulatory Visit (INDEPENDENT_AMBULATORY_CARE_PROVIDER_SITE_OTHER): Payer: Medicare HMO

## 2021-11-11 DIAGNOSIS — I639 Cerebral infarction, unspecified: Secondary | ICD-10-CM | POA: Diagnosis not present

## 2021-11-12 LAB — CUP PACEART REMOTE DEVICE CHECK
Date Time Interrogation Session: 20230414231147
Implantable Pulse Generator Implant Date: 20210609

## 2021-11-18 ENCOUNTER — Ambulatory Visit: Payer: Medicare HMO | Admitting: Neurology

## 2021-11-18 ENCOUNTER — Ambulatory Visit: Payer: Medicare HMO | Admitting: Adult Health

## 2021-11-19 ENCOUNTER — Ambulatory Visit: Payer: Medicare HMO | Admitting: Neurology

## 2021-11-20 ENCOUNTER — Telehealth: Payer: Self-pay | Admitting: Adult Health

## 2021-11-20 NOTE — Telephone Encounter (Signed)
Noted  

## 2021-11-20 NOTE — Telephone Encounter (Signed)
Called patient to discuss Dr Guadelupe Sabin auto pap order. She stated she didn't tolerate it, tried different masks. She returned the unit in March due to not being able to use it. Changed her visit to my video visit. Patient verbalized understanding, appreciation. ? ?

## 2021-11-20 NOTE — Telephone Encounter (Signed)
Pt is requesting to change office visit scheduled 11/28/2021 to a virtual visit.  ?Pt is having no new issues or symptoms.  ?Would like a call back.  ? ?

## 2021-11-28 ENCOUNTER — Telehealth: Payer: Medicare HMO | Admitting: Adult Health

## 2021-11-28 NOTE — Progress Notes (Signed)
Carelink Summary Report / Loop Recorder 

## 2021-12-09 ENCOUNTER — Telehealth: Payer: Medicare HMO | Admitting: Adult Health

## 2021-12-15 LAB — CUP PACEART REMOTE DEVICE CHECK
Date Time Interrogation Session: 20230517232029
Implantable Pulse Generator Implant Date: 20210609

## 2021-12-16 ENCOUNTER — Ambulatory Visit (INDEPENDENT_AMBULATORY_CARE_PROVIDER_SITE_OTHER): Payer: Medicare HMO

## 2021-12-16 DIAGNOSIS — I639 Cerebral infarction, unspecified: Secondary | ICD-10-CM | POA: Diagnosis not present

## 2022-01-01 NOTE — Progress Notes (Signed)
Carelink Summary Report / Loop Recorder 

## 2022-01-07 DIAGNOSIS — Z8673 Personal history of transient ischemic attack (TIA), and cerebral infarction without residual deficits: Secondary | ICD-10-CM | POA: Diagnosis not present

## 2022-01-07 DIAGNOSIS — I10 Essential (primary) hypertension: Secondary | ICD-10-CM | POA: Diagnosis not present

## 2022-01-13 ENCOUNTER — Telehealth: Payer: Medicare HMO | Admitting: Adult Health

## 2022-01-13 ENCOUNTER — Encounter: Payer: Self-pay | Admitting: Adult Health

## 2022-01-13 DIAGNOSIS — Z789 Other specified health status: Secondary | ICD-10-CM | POA: Diagnosis not present

## 2022-01-13 DIAGNOSIS — I639 Cerebral infarction, unspecified: Secondary | ICD-10-CM | POA: Diagnosis not present

## 2022-01-13 DIAGNOSIS — G4719 Other hypersomnia: Secondary | ICD-10-CM

## 2022-01-13 DIAGNOSIS — F419 Anxiety disorder, unspecified: Secondary | ICD-10-CM

## 2022-01-13 DIAGNOSIS — G4733 Obstructive sleep apnea (adult) (pediatric): Secondary | ICD-10-CM | POA: Diagnosis not present

## 2022-01-13 DIAGNOSIS — R69 Illness, unspecified: Secondary | ICD-10-CM | POA: Diagnosis not present

## 2022-01-13 NOTE — Patient Instructions (Signed)
No changes today - continue current plan  Continue to follow with your PCP for current medication refills and monitoring/management   As you have been doing well from a stroke standpoint, you can follow-up with [place] on an as-needed basis. Please do not hesitate to call with any questions or concerns.

## 2022-01-13 NOTE — Progress Notes (Signed)
[facility] [address] [place] [address] [phone]       OFFICE FOLLOW-UP NOTE  Doris Ford Date of Birth:  [birth year] Medical Record Number:  [id number]    Reason for visit: Stroke follow-up   Virtual Visit via Video Note  Virtual visit completed through MyChart, a video enabled telemedicine application. Due to national recommendations of social distancing due to COVID-19, a virtual visit is felt to be most appropriate for this patient at this time. Reviewed limitations, risks, security and privacy concerns of performing a virtual visit and the availability of in person appointments. I also reviewed that there may be a patient responsible charge related to this service. The patient agreed to proceed.    Patient location: home Provider location: in office, [facility] Persons participating in this virtual visit: patient and provider   If any vitals were documented, they were collected by patient at home unless specified below.      CHIEF COMPLAINT: Stroke follow-up      HPI:   Update 01/13/2022 JM: Patient returns for stroke follow-up visit via MyChart video visit after prior visit approximately 8 months ago.  She is unaccompanied at visit.  Stable from stroke standpoint without new or reoccurring stroke/TIA symptoms.  Compliant on Plavix and atorvastatin, denies side effects.  Blood pressure routinely monitored at home, typically 110-120s/60-70s.  Currently on hydrochlorothiazide and amlodipine.  PCP switch quinapril to lisinopril back in March but was concerned of lower BP and lightheadedness sensation after starting.  She self discontinued lisinopril and blood pressures have been stable.  Has follow-up visit tomorrow with PCP to further discuss.  Completed HST 04/2021 which showed mild OSA with total AHI of 14.3/hr and O2 nadir of 80% and recommended treatment with CPAP but unfortunately had difficulty tolerating  despite trying different masks and returned her machine back in March. She does still c/o daytime fatigue but some what improved since prior visit. Does take naps during the day.  Believes her anxiety has improved with increased dose of sertraline currently at 75 mg nightly, denies side effects.  No further concerns at this time.      History provided for reference purposes only Update 05/15/2021 JM: returns for 4 moth stroke follow up accompanied by her daughter, Doris Ford. Overall stable from stroke standpoint. Denies new or reoccurring stroke/TIA symptoms.  She does continue to experience daytime fatigue as well as likely underlying anxiety with increased agitation (although does not believe she has anxiety or depression).  She has remained on sertraline 50 mg daily.  She plans on establishing care with new PCP Dr. Nevada Ford on 11/8.  Scheduled to undergo HST next week to evaluate for sleep apnea.  TSH and B12 levels normal.  Compliant on Plavix and atorvastatin without side effects.  Blood pressure today 149/72.  No further concerns at this time.  Update 01/22/2021 JM: Doris Ford returns for 43-monthstroke follow-up accompanied by her daughter, JMarcie Ford  She has been stable from stroke standpoint without new or reoccurring stroke/TIA symptoms.  Compliant on Plavix and atorvastatin without associated side effects.  Blood pressure 157/79.  Loop recorder has not shown atrial fibrillation thus far.  Initiated sertraline 25 mg nightly for worsening anxiety with increase to 50 mg nightly by PCP in March which she reports has greatly improved.  She continues to experience mild anxiety only on occasion.  She also continues to struggle with daytime fatigue and has been trying to keep active during the day  with exercising 5 times weekly at her local gym and constantly keeping active.  She reports sleeping well at night.  She is not aware of any snoring or witnessed apneas.  No further concerns at this time  Update  09/05/2020 JM: Doris Ford returns for 24-monthstroke follow-up. She has been doing well since prior visit without new recurrent stroke/TIA symptoms. Denies residual focal deficits but does report continued fatigue, lack of energy and anxiety. She has been slowly increasing daily exercise hoping this would help with her fatigue but she continues to struggle on a daily basis. She has remained on atorvastatin and Plavix without side effects. Blood pressure today 142/74. Loop recorder has not shown evidence of atrial fibrillation thus far.  No further concerns at this time.  Initial visit 03/05/2020 Dr. SLeonie Man Doris Ford a pleasant 82year old Caucasian lady seen today for initial office follow-up visit following hospital consultation for stroke in June 2021.  She is accompanied by her daughter.  History is obtained from them, review of electronic medical records and I personally reviewed imaging films in PACS. She has past medical history of hypertension, GI hemorrhage, osteopenia and COVID-19 infection.  She developed sudden onset of facial droop and weakness difficulty drinking water drooling from the corner of the mouth and dysarthria.  She presented outside time window for TPA.  CT scan of the head on admission showed no acute abnormalities.  MRI scan of the brain showed small right MCA territory infarcts involving right frontal operculum, insula and right parietal region.  There was tiny remote age right cerebellar infarct also noted.  CT angiogramof the brain and neck showed only mild extracranial left carotid stenosis.  2D echo showed normal ejection fraction without cardiac source of embolism.  Lower extremity Dopplers were negative for DVT.  LDL cholesterol was elevated at 115 mg percent and hemoglobin A1c was 5.9.  She had a loop recorder inserted and so for paroxysmal A. fib has not been found.  She was not discharged on aspirin Plavix and stop aspirin is currently on Plavix which is tolerating well with  only minor bruising.  She is also on Lipitor which is tolerating well without muscle aches and pains.  Blood pressures well controlled today it is 110/70.  She states that her facial weakness, dysarthria and swallowing difficulties have all resolved completely and she has no residual deficits from the stroke.  She however feels quite anxious and edgy and is at times has been sharp with people which is new for her.  She denies any prior known history of strokes prior to this event.    ROS:   14 system review of systems is positive for those listed in HPI and all other systems negative   PMH:  Past Medical History:  Diagnosis Date   Arthritis    Breast cancer ([place] 06/2006   right breast/tx with lumpectomy and tamoxifen   COVID-19    Gastric ulcer    no NSAIDs   Hypertension    under control   Interstitial cystitis    Osteopenia    no change    Social History:  Social History   Socioeconomic History   Marital status: Married    Spouse name: Not on file   Number of children: Not on file   Years of education: Not on file   Highest education level: Not on file  Occupational History   Not on file  Tobacco Use   Smoking status: Never   Smokeless tobacco:  Never  Vaping Use   Vaping Use: Never used  Substance and Sexual Activity   Alcohol use: No   Drug use: No   Sexual activity: Not Currently    Partners: Male    Birth control/protection: Surgical    Comment: TVH  Other Topics Concern   Not on file  Social History Narrative   Not on file   Social Determinants of Health   Financial Resource Strain: Not on file  Food Insecurity: Not on file  Transportation Needs: Not on file  Physical Activity: Not on file  Stress: Not on file  Social Connections: Not on file  Intimate Partner Violence: Not on file    Medications:   Current Outpatient Medications on File Prior to Visit  Medication Sig Dispense Refill   acetaminophen (TYLENOL) 650 MG CR tablet Take 650-1,300 mg  by mouth 2 (two) times daily as needed for pain.     amLODipine (NORVASC) 5 MG tablet Take 5 mg by mouth daily.     Ascorbic Acid (VITAMIN C) 1000 MG tablet Take 1,000 mg by mouth daily.     atorvastatin (LIPITOR) 40 MG tablet Take 1 tablet (40 mg total) by mouth daily. 30 tablet 1   Calcium-Vitamin D (CALTRATE 600 PLUS-VIT D PO) Take 1 tablet by mouth daily.      cetirizine HCl (ZYRTEC) 1 MG/ML solution Take 5 mLs (5 mg total) by mouth daily. 300 mL 0   Cholecalciferol (VITAMIN D3) 50 MCG (2000 UT) TABS Take 2,000 Units by mouth daily.     clopidogrel (PLAVIX) 75 MG tablet Take 1 tablet (75 mg total) by mouth daily. 30 tablet 1   fluticasone (FLONASE) 50 MCG/ACT nasal spray Place 2 sprays into both nostrils daily. 16 g 12   hydrochlorothiazide (HYDRODIURIL) 12.5 MG tablet Take 12.5 mg by mouth daily.     loratadine (CLARITIN) 10 MG tablet Take 10 mg by mouth daily.     NON FORMULARY Take 1 capsule by mouth daily. Cysta-Q     Polyethyl Glyc-Propyl Glyc PF (SYSTANE HYDRATION PF) 0.4-0.3 % SOLN Apply to eye.     polyethylene glycol (MIRALAX / GLYCOLAX) 17 g packet Take 17 g by mouth daily as needed for mild constipation. 14 each 0   predniSONE (DELTASONE) 10 MG tablet Take 3 tablets (30 mg total) by mouth daily with breakfast. 15 tablet 0   quinapril (ACCUPRIL) 20 MG tablet Take 1 tablet (20 mg total) by mouth daily. (Patient taking differently: No sig reported)     sertraline (ZOLOFT) 50 MG tablet Take 1.5 tablets (75 mg total) by mouth daily. 45 tablet 5   No current facility-administered medications on file prior to visit.    Allergies:   Allergies  Allergen Reactions   Codeine     Severe nausea.  Dizziness.   Macrobid [Nitrofurantoin] Other (See Comments)    Skin eruption    Physical Exam General: well developed, well nourished, pleasant elderly Caucasian female, seated, in no evident distress Head: head normocephalic and atraumatic.    Neurologic Exam Mental Status: Awake and  fully alert. Oriented to place and time. Recent and remote memory intact. Attention span, concentration and fund of knowledge appropriate. Mood and affect appropriate.  Cranial Nerves: Extraocular movements full without nystagmus. Hearing intact to voice. Facial sensation intact. Face, tongue, palate moves normally and symmetrically.  Shoulder shrug symmetric. Motor: No evidence of weakness per drift assessment Coordination: Rapid alternating movements normal in all extremities. Finger-to-nose and heel-to-shin performed accurately bilaterally.  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     R frontal MCA stroke, cryptogenic -Loop recorder has not shown atrial fibrillation thus far -routinely monitored by cardiology -Continue Plavix and atorvastatin 40 mg daily for secondary stroke prevention -Discussed secondary stroke prevention measures and importance of close PCP follow-up for aggressive stroke risk factor management including BP goal<130/90, and HLD with LDL goal<70   Anxiety with depression -Currently stable -Continue sertraline 75 mg nightly -request ongoing refills with monitoring and management by PCP   Excessive daytime fatigue -Likely multifactorial  -HST 04/2021 showed mild OSA -intolerant to CPAP therapy. Discussed use of dental device - she will call if interested in pursuing -Continue to follow with PCP for monitoring    Doing well from stroke standpoint without further recommendations and risk factors are managed by PCP. She may follow up PRN, as usual for our patients who are strictly being followed for stroke. If any new neurological issues should arise, request PCP place referral for evaluation by one of our  neurologists. Thank you.      CC:  Celene Squibb, MD   I spent 26 minutes of face-to-face and non-face-to-face time with patient via MyChart video visit.  This included previsit chart review, lab review, study review, electronic health record documentation, patient education and discussion regarding history of prior stroke including secondary stroke prevention and aggressive stroke risk factor management, anxiety with depression, excessive daytime fatigue and diagnosis of mild OSA and CPAP intolerance, monitoring of loop recorder, and answered all other questions to patient satisfaction  Frann Rider, AGNP-BC  [facility] [address] [place],  [address]  Phone [phone] Fax [phone] Note: This document was prepared with digital dictation and possible smart phrase technology. Any transcriptional errors that result from this process are unintentional.

## 2022-01-14 DIAGNOSIS — M545 Low back pain, unspecified: Secondary | ICD-10-CM | POA: Diagnosis not present

## 2022-01-14 DIAGNOSIS — R008 Other abnormalities of heart beat: Secondary | ICD-10-CM | POA: Diagnosis not present

## 2022-01-14 DIAGNOSIS — Z0001 Encounter for general adult medical examination with abnormal findings: Secondary | ICD-10-CM | POA: Diagnosis not present

## 2022-01-14 DIAGNOSIS — I10 Essential (primary) hypertension: Secondary | ICD-10-CM | POA: Diagnosis not present

## 2022-01-14 DIAGNOSIS — G4730 Sleep apnea, unspecified: Secondary | ICD-10-CM | POA: Diagnosis not present

## 2022-01-14 DIAGNOSIS — K2210 Ulcer of esophagus without bleeding: Secondary | ICD-10-CM | POA: Diagnosis not present

## 2022-01-14 DIAGNOSIS — R69 Illness, unspecified: Secondary | ICD-10-CM | POA: Diagnosis not present

## 2022-01-14 DIAGNOSIS — K922 Gastrointestinal hemorrhage, unspecified: Secondary | ICD-10-CM | POA: Diagnosis not present

## 2022-01-14 DIAGNOSIS — Z8673 Personal history of transient ischemic attack (TIA), and cerebral infarction without residual deficits: Secondary | ICD-10-CM | POA: Diagnosis not present

## 2022-01-14 DIAGNOSIS — M84374D Stress fracture, right foot, subsequent encounter for fracture with routine healing: Secondary | ICD-10-CM | POA: Diagnosis not present

## 2022-01-14 DIAGNOSIS — H9193 Unspecified hearing loss, bilateral: Secondary | ICD-10-CM | POA: Diagnosis not present

## 2022-01-14 DIAGNOSIS — M170 Bilateral primary osteoarthritis of knee: Secondary | ICD-10-CM | POA: Diagnosis not present

## 2022-01-20 ENCOUNTER — Ambulatory Visit (INDEPENDENT_AMBULATORY_CARE_PROVIDER_SITE_OTHER): Payer: Medicare HMO

## 2022-01-20 DIAGNOSIS — I639 Cerebral infarction, unspecified: Secondary | ICD-10-CM | POA: Diagnosis not present

## 2022-01-21 LAB — CUP PACEART REMOTE DEVICE CHECK
Date Time Interrogation Session: 20230619232452
Implantable Pulse Generator Implant Date: 20210609

## 2022-01-30 DIAGNOSIS — E876 Hypokalemia: Secondary | ICD-10-CM | POA: Diagnosis not present

## 2022-02-14 NOTE — Progress Notes (Signed)
Carelink Summary Report / Loop Recorder 

## 2022-02-17 DIAGNOSIS — M542 Cervicalgia: Secondary | ICD-10-CM | POA: Diagnosis not present

## 2022-02-17 DIAGNOSIS — M5136 Other intervertebral disc degeneration, lumbar region: Secondary | ICD-10-CM | POA: Diagnosis not present

## 2022-02-17 DIAGNOSIS — M9902 Segmental and somatic dysfunction of thoracic region: Secondary | ICD-10-CM | POA: Diagnosis not present

## 2022-02-17 DIAGNOSIS — M9901 Segmental and somatic dysfunction of cervical region: Secondary | ICD-10-CM | POA: Diagnosis not present

## 2022-02-17 DIAGNOSIS — M546 Pain in thoracic spine: Secondary | ICD-10-CM | POA: Diagnosis not present

## 2022-02-17 DIAGNOSIS — M9903 Segmental and somatic dysfunction of lumbar region: Secondary | ICD-10-CM | POA: Diagnosis not present

## 2022-02-17 DIAGNOSIS — M9905 Segmental and somatic dysfunction of pelvic region: Secondary | ICD-10-CM | POA: Diagnosis not present

## 2022-02-20 LAB — CUP PACEART REMOTE DEVICE CHECK
Date Time Interrogation Session: 20230722233543
Implantable Pulse Generator Implant Date: 20210609

## 2022-02-21 DIAGNOSIS — M542 Cervicalgia: Secondary | ICD-10-CM | POA: Diagnosis not present

## 2022-02-21 DIAGNOSIS — M546 Pain in thoracic spine: Secondary | ICD-10-CM | POA: Diagnosis not present

## 2022-02-21 DIAGNOSIS — M9905 Segmental and somatic dysfunction of pelvic region: Secondary | ICD-10-CM | POA: Diagnosis not present

## 2022-02-21 DIAGNOSIS — M5136 Other intervertebral disc degeneration, lumbar region: Secondary | ICD-10-CM | POA: Diagnosis not present

## 2022-02-21 DIAGNOSIS — M9902 Segmental and somatic dysfunction of thoracic region: Secondary | ICD-10-CM | POA: Diagnosis not present

## 2022-02-21 DIAGNOSIS — M9901 Segmental and somatic dysfunction of cervical region: Secondary | ICD-10-CM | POA: Diagnosis not present

## 2022-02-21 DIAGNOSIS — M9903 Segmental and somatic dysfunction of lumbar region: Secondary | ICD-10-CM | POA: Diagnosis not present

## 2022-02-24 ENCOUNTER — Ambulatory Visit (INDEPENDENT_AMBULATORY_CARE_PROVIDER_SITE_OTHER): Payer: Medicare HMO

## 2022-02-24 DIAGNOSIS — M9905 Segmental and somatic dysfunction of pelvic region: Secondary | ICD-10-CM | POA: Diagnosis not present

## 2022-02-24 DIAGNOSIS — M542 Cervicalgia: Secondary | ICD-10-CM | POA: Diagnosis not present

## 2022-02-24 DIAGNOSIS — M9902 Segmental and somatic dysfunction of thoracic region: Secondary | ICD-10-CM | POA: Diagnosis not present

## 2022-02-24 DIAGNOSIS — M9901 Segmental and somatic dysfunction of cervical region: Secondary | ICD-10-CM | POA: Diagnosis not present

## 2022-02-24 DIAGNOSIS — M9903 Segmental and somatic dysfunction of lumbar region: Secondary | ICD-10-CM | POA: Diagnosis not present

## 2022-02-24 DIAGNOSIS — I639 Cerebral infarction, unspecified: Secondary | ICD-10-CM | POA: Diagnosis not present

## 2022-02-24 DIAGNOSIS — M546 Pain in thoracic spine: Secondary | ICD-10-CM | POA: Diagnosis not present

## 2022-02-24 DIAGNOSIS — M5136 Other intervertebral disc degeneration, lumbar region: Secondary | ICD-10-CM | POA: Diagnosis not present

## 2022-02-26 DIAGNOSIS — M5136 Other intervertebral disc degeneration, lumbar region: Secondary | ICD-10-CM | POA: Diagnosis not present

## 2022-02-26 DIAGNOSIS — M9902 Segmental and somatic dysfunction of thoracic region: Secondary | ICD-10-CM | POA: Diagnosis not present

## 2022-02-26 DIAGNOSIS — M9905 Segmental and somatic dysfunction of pelvic region: Secondary | ICD-10-CM | POA: Diagnosis not present

## 2022-02-26 DIAGNOSIS — M546 Pain in thoracic spine: Secondary | ICD-10-CM | POA: Diagnosis not present

## 2022-02-26 DIAGNOSIS — M542 Cervicalgia: Secondary | ICD-10-CM | POA: Diagnosis not present

## 2022-02-26 DIAGNOSIS — M9901 Segmental and somatic dysfunction of cervical region: Secondary | ICD-10-CM | POA: Diagnosis not present

## 2022-02-26 DIAGNOSIS — M9903 Segmental and somatic dysfunction of lumbar region: Secondary | ICD-10-CM | POA: Diagnosis not present

## 2022-02-28 DIAGNOSIS — M9903 Segmental and somatic dysfunction of lumbar region: Secondary | ICD-10-CM | POA: Diagnosis not present

## 2022-02-28 DIAGNOSIS — M9905 Segmental and somatic dysfunction of pelvic region: Secondary | ICD-10-CM | POA: Diagnosis not present

## 2022-02-28 DIAGNOSIS — M5136 Other intervertebral disc degeneration, lumbar region: Secondary | ICD-10-CM | POA: Diagnosis not present

## 2022-02-28 DIAGNOSIS — M542 Cervicalgia: Secondary | ICD-10-CM | POA: Diagnosis not present

## 2022-02-28 DIAGNOSIS — M9902 Segmental and somatic dysfunction of thoracic region: Secondary | ICD-10-CM | POA: Diagnosis not present

## 2022-02-28 DIAGNOSIS — M546 Pain in thoracic spine: Secondary | ICD-10-CM | POA: Diagnosis not present

## 2022-02-28 DIAGNOSIS — M9901 Segmental and somatic dysfunction of cervical region: Secondary | ICD-10-CM | POA: Diagnosis not present

## 2022-03-07 DIAGNOSIS — M5136 Other intervertebral disc degeneration, lumbar region: Secondary | ICD-10-CM | POA: Diagnosis not present

## 2022-03-07 DIAGNOSIS — M9905 Segmental and somatic dysfunction of pelvic region: Secondary | ICD-10-CM | POA: Diagnosis not present

## 2022-03-07 DIAGNOSIS — M9902 Segmental and somatic dysfunction of thoracic region: Secondary | ICD-10-CM | POA: Diagnosis not present

## 2022-03-07 DIAGNOSIS — M542 Cervicalgia: Secondary | ICD-10-CM | POA: Diagnosis not present

## 2022-03-07 DIAGNOSIS — M9901 Segmental and somatic dysfunction of cervical region: Secondary | ICD-10-CM | POA: Diagnosis not present

## 2022-03-07 DIAGNOSIS — M546 Pain in thoracic spine: Secondary | ICD-10-CM | POA: Diagnosis not present

## 2022-03-07 DIAGNOSIS — M9903 Segmental and somatic dysfunction of lumbar region: Secondary | ICD-10-CM | POA: Diagnosis not present

## 2022-03-10 DIAGNOSIS — M9903 Segmental and somatic dysfunction of lumbar region: Secondary | ICD-10-CM | POA: Diagnosis not present

## 2022-03-10 DIAGNOSIS — M542 Cervicalgia: Secondary | ICD-10-CM | POA: Diagnosis not present

## 2022-03-10 DIAGNOSIS — M5136 Other intervertebral disc degeneration, lumbar region: Secondary | ICD-10-CM | POA: Diagnosis not present

## 2022-03-10 DIAGNOSIS — M9902 Segmental and somatic dysfunction of thoracic region: Secondary | ICD-10-CM | POA: Diagnosis not present

## 2022-03-10 DIAGNOSIS — M9901 Segmental and somatic dysfunction of cervical region: Secondary | ICD-10-CM | POA: Diagnosis not present

## 2022-03-10 DIAGNOSIS — M546 Pain in thoracic spine: Secondary | ICD-10-CM | POA: Diagnosis not present

## 2022-03-10 DIAGNOSIS — M9905 Segmental and somatic dysfunction of pelvic region: Secondary | ICD-10-CM | POA: Diagnosis not present

## 2022-03-17 DIAGNOSIS — M9903 Segmental and somatic dysfunction of lumbar region: Secondary | ICD-10-CM | POA: Diagnosis not present

## 2022-03-17 DIAGNOSIS — M546 Pain in thoracic spine: Secondary | ICD-10-CM | POA: Diagnosis not present

## 2022-03-17 DIAGNOSIS — M9902 Segmental and somatic dysfunction of thoracic region: Secondary | ICD-10-CM | POA: Diagnosis not present

## 2022-03-17 DIAGNOSIS — M9901 Segmental and somatic dysfunction of cervical region: Secondary | ICD-10-CM | POA: Diagnosis not present

## 2022-03-17 DIAGNOSIS — M9905 Segmental and somatic dysfunction of pelvic region: Secondary | ICD-10-CM | POA: Diagnosis not present

## 2022-03-17 DIAGNOSIS — M542 Cervicalgia: Secondary | ICD-10-CM | POA: Diagnosis not present

## 2022-03-17 DIAGNOSIS — M5136 Other intervertebral disc degeneration, lumbar region: Secondary | ICD-10-CM | POA: Diagnosis not present

## 2022-03-24 DIAGNOSIS — M5136 Other intervertebral disc degeneration, lumbar region: Secondary | ICD-10-CM | POA: Diagnosis not present

## 2022-03-24 DIAGNOSIS — M9902 Segmental and somatic dysfunction of thoracic region: Secondary | ICD-10-CM | POA: Diagnosis not present

## 2022-03-24 DIAGNOSIS — M542 Cervicalgia: Secondary | ICD-10-CM | POA: Diagnosis not present

## 2022-03-24 DIAGNOSIS — M546 Pain in thoracic spine: Secondary | ICD-10-CM | POA: Diagnosis not present

## 2022-03-24 DIAGNOSIS — M9903 Segmental and somatic dysfunction of lumbar region: Secondary | ICD-10-CM | POA: Diagnosis not present

## 2022-03-24 DIAGNOSIS — M9905 Segmental and somatic dysfunction of pelvic region: Secondary | ICD-10-CM | POA: Diagnosis not present

## 2022-03-24 DIAGNOSIS — M9901 Segmental and somatic dysfunction of cervical region: Secondary | ICD-10-CM | POA: Diagnosis not present

## 2022-03-30 NOTE — Progress Notes (Signed)
Carelink Summary Report / Loop Recorder 

## 2022-03-31 LAB — CUP PACEART REMOTE DEVICE CHECK
Date Time Interrogation Session: 20230831212332
Implantable Pulse Generator Implant Date: 20210609

## 2022-04-01 ENCOUNTER — Ambulatory Visit (INDEPENDENT_AMBULATORY_CARE_PROVIDER_SITE_OTHER): Payer: Medicare HMO

## 2022-04-01 DIAGNOSIS — I639 Cerebral infarction, unspecified: Secondary | ICD-10-CM

## 2022-04-07 DIAGNOSIS — H52223 Regular astigmatism, bilateral: Secondary | ICD-10-CM | POA: Diagnosis not present

## 2022-04-07 DIAGNOSIS — H40023 Open angle with borderline findings, high risk, bilateral: Secondary | ICD-10-CM | POA: Diagnosis not present

## 2022-04-07 DIAGNOSIS — H5203 Hypermetropia, bilateral: Secondary | ICD-10-CM | POA: Diagnosis not present

## 2022-04-24 NOTE — Progress Notes (Signed)
Carelink Summary Report / Loop Recorder 

## 2022-04-30 LAB — CUP PACEART REMOTE DEVICE CHECK
Date Time Interrogation Session: 20231002232504
Implantable Pulse Generator Implant Date: 20210609

## 2022-05-05 ENCOUNTER — Ambulatory Visit (INDEPENDENT_AMBULATORY_CARE_PROVIDER_SITE_OTHER): Payer: Medicare HMO

## 2022-05-05 DIAGNOSIS — I639 Cerebral infarction, unspecified: Secondary | ICD-10-CM

## 2022-05-05 IMAGING — CT CT ANGIO HEAD
2 of 7 series · 8 of 33 positions shown · IV contrast (Omnipaque or Isovue)
Comparison: Noncontrast head CT performed earlier the same day
01/01/2020

CLINICAL DATA: Carotid artery stenosis; stroke, follow-up.
Additional provided: Slurred speech, facial numbness, blurred
vision.

EXAM:
CT ANGIOGRAPHY HEAD AND NECK
TECHNIQUE: Multidetector CT imaging of the head and neck was performed using
the standard protocol during bolus administration of intravenous
contrast. Multiplanar CT image reconstructions and MIPs were
obtained to evaluate the vascular anatomy. Carotid stenosis
measurements (when applicable) are obtained utilizing NASCET
criteria, using the distal internal carotid diameter as the
denominator.
CONTRAST:  60mL OMNIPAQUE IOHEXOL 350 MG/ML SOLN

[Series 6: cta head & neck · axial · 0.47mm/px · z∈[+1385,+1503]mm · 2 of 176 slices shown]
[im 59/176  soft-tissue]
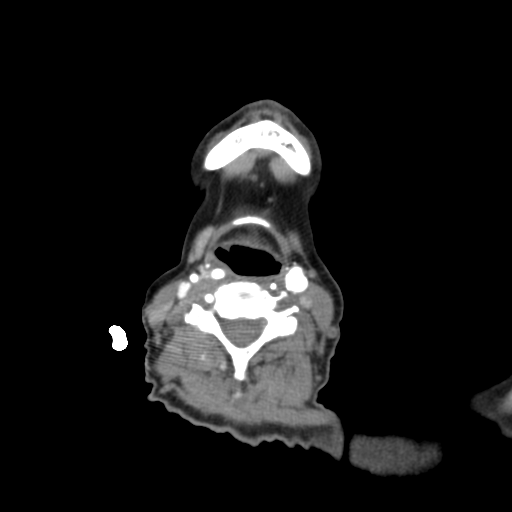
[im 117/176  bone]
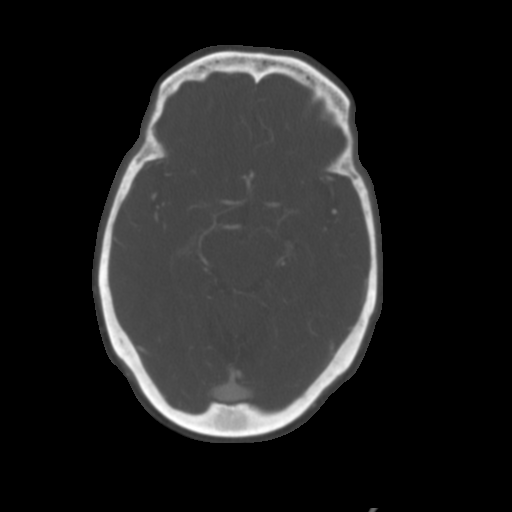

[Series 8: ax thin · axial · 0.39mm/px · z∈[+1317,+1558]mm · 6 of 339 slices shown]
[im 49/339  soft-tissue]
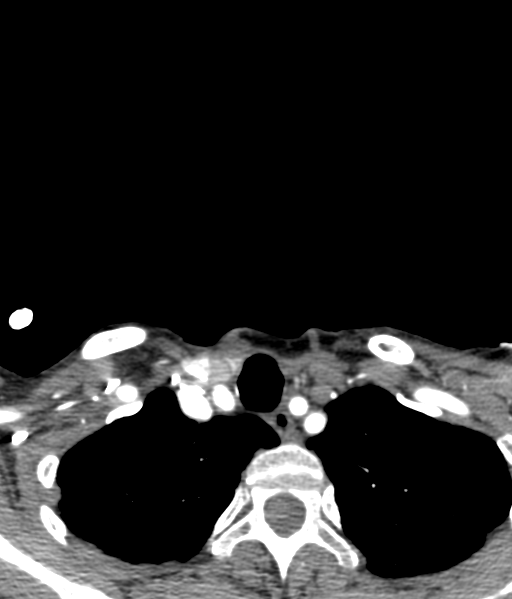
[im 97/339  soft-tissue]
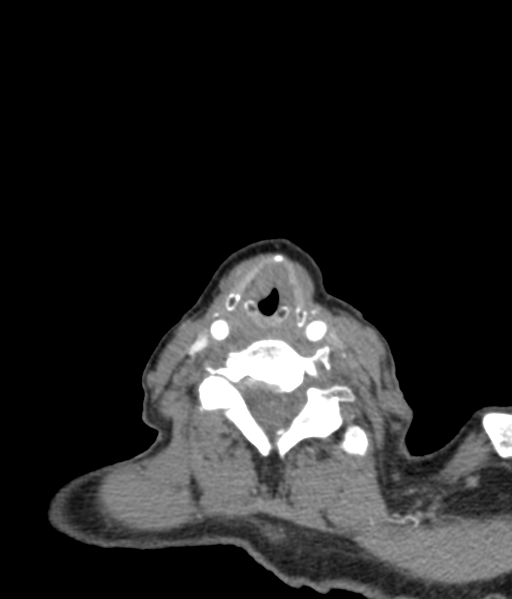
[im 145/339  soft-tissue]
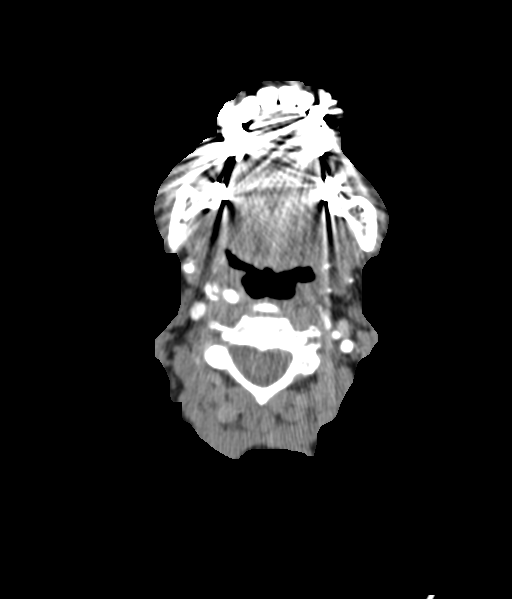
[im 194/339  soft-tissue]
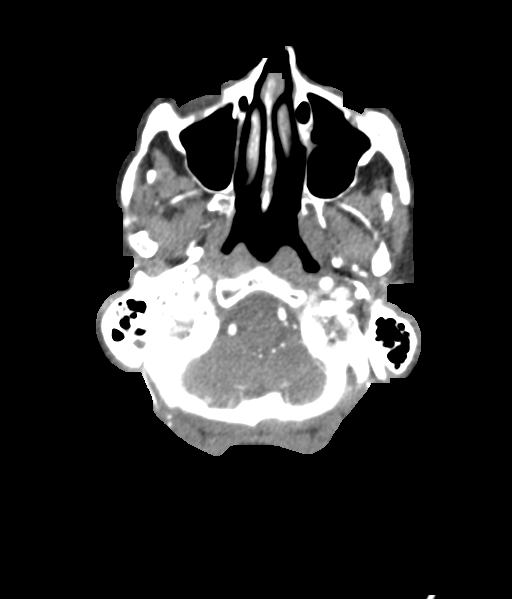
[im 242/339  soft-tissue]
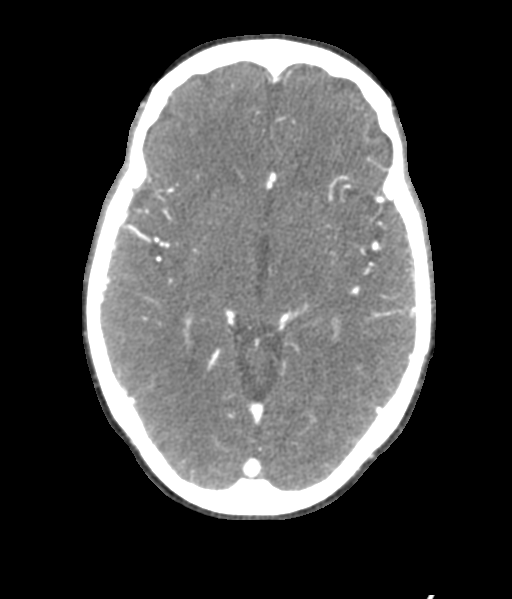
[im 290/339  soft-tissue]
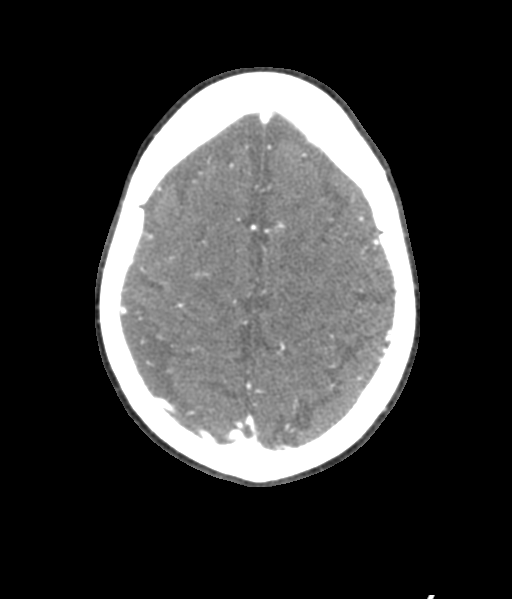

[8 of 33 positions shown; findings below may reference images not displayed]

FINDINGS: CTA NECK FINDINGS

Aortic arch: The origins of the innominate and left common carotid
arteries are excluded from the field of view. Minimal
atherosclerotic plaque within the visualized aortic arch. No
hemodynamically significant innominate or proximal subclavian artery
stenosis within described limitations.

Right carotid system: CCA and ICA patent within the neck without
significant stenosis (50% or greater). Mild mixed plaque within the
carotid bifurcation and proximal ICA.

Left carotid system: Within described limitations, CCA and ICA
patent within the neck without significant stenosis (50% or
greater). Mild mixed plaque within the carotid bifurcation.

Vertebral arteries: Codominant and patent within the neck without
significant stenosis

Skeleton: No acute bony abnormality or aggressive osseous lesion.
Cervical spondylosis without high-grade bony spinal canal narrowing.

Other neck: No neck mass or cervical lymphadenopathy.

Upper chest: No consolidation within the imaged lung apices.

Review of the MIP images confirms the above findings

CTA HEAD FINDINGS

Anterior circulation:

The intracranial internal carotid arteries are patent. Calcified
plaque within these vessels without significant stenosis.

The M1 middle cerebral arteries are patent without significant
stenosis. There is a high-grade focal stenosis within a superior
division mid M2 right MCA branch vessel (series 13, image 14).
Additionally, there is a high-grade focal stenosis within a distal
inferior division right MCA branch vessel (series 13, image 12).
Severe focal stenosis within a superior division proximal M2 left
MCA branch (series 8, image 106) (series 10, image 137) (series 12,
image 17). Moderate to moderately severe focal stenosis within a
superior division mid M2 left MCA branch (series 13, image 29).

The anterior cerebral arteries are patent. Moderate/severe focal
stenosis within the A2 left anterior cerebral artery (series 12,
image 13).

No intracranial aneurysm is identified.

Posterior circulation:

The intracranial vertebral arteries are patent without significant
stenosis, as is the basilar artery. Fetal origin left posterior
cerebral artery. The right posterior cerebral artery is patent.
There is a high-grade stenosis within the P3 right PCA (series 11,
image 21). Additionally, there are multifocal high-grade stenoses
within distal P2 and more distal left PCA branch vessels (series 11,
image 21) (series 13, image 24). The right posterior communicating
artery is hypoplastic or absent.

Venous sinuses: Within limitations of contrast timing, no convincing
thrombus.

Anatomic variants: As described

Review of the MIP images confirms the above findings

These results were called by telephone at the time of interpretation
on 01/01/2020 at [DATE] to provider PANESTI MALLAWAARACHCHI , who verbally
acknowledged these results.
IMPRESSION: CTA neck:

1. The origins of the innominate and left common carotid arteries
are excluded from the field of view.
2. Within this limitation, the bilateral common and internal carotid
arteries are patent within the neck without significant stenosis
(50% or greater). Mild atherosclerotic plaque within the carotid
systems as described.
3. The vertebral arteries are patent within the neck bilaterally
without significant stenosis.

CTA head:

1. No intracranial large vessel occlusion is identified.
2. Intracranial atherosclerotic disease with multifocal stenoses
most notably as follows.
3. Severe focal stenosis within a superior division proximal M2 left
MCA branch vessel.
4. Moderate to moderately severe focal stenosis within a superior
division mid M2 left MCA branch.
5. High-grade focal stenosis within a superior division mid M2 right
MCA branch vessel.
6. High-grade focal stenosis within a distal inferior division right
MCA branch.
7. Moderate/severe focal stenosis within the A2 left anterior
cerebral artery.
8. High-grade stenosis within the P3 right posterior cerebral
artery.
9. Multifocal high-grade stenoses within P2 and more distal left PCA
branch vessels.

## 2022-05-14 NOTE — Progress Notes (Signed)
Carelink Summary Report / Loop Recorder 

## 2022-06-05 LAB — CUP PACEART REMOTE DEVICE CHECK
Date Time Interrogation Session: 20231104231948
Implantable Pulse Generator Implant Date: 20210609

## 2022-06-09 ENCOUNTER — Ambulatory Visit (INDEPENDENT_AMBULATORY_CARE_PROVIDER_SITE_OTHER): Payer: Medicare HMO

## 2022-06-09 DIAGNOSIS — I639 Cerebral infarction, unspecified: Secondary | ICD-10-CM | POA: Diagnosis not present

## 2022-07-07 DIAGNOSIS — J309 Allergic rhinitis, unspecified: Secondary | ICD-10-CM | POA: Diagnosis not present

## 2022-07-07 DIAGNOSIS — Z853 Personal history of malignant neoplasm of breast: Secondary | ICD-10-CM | POA: Diagnosis not present

## 2022-07-07 DIAGNOSIS — Z803 Family history of malignant neoplasm of breast: Secondary | ICD-10-CM | POA: Diagnosis not present

## 2022-07-07 DIAGNOSIS — E785 Hyperlipidemia, unspecified: Secondary | ICD-10-CM | POA: Diagnosis not present

## 2022-07-07 DIAGNOSIS — R69 Illness, unspecified: Secondary | ICD-10-CM | POA: Diagnosis not present

## 2022-07-07 DIAGNOSIS — G8929 Other chronic pain: Secondary | ICD-10-CM | POA: Diagnosis not present

## 2022-07-07 DIAGNOSIS — Z7902 Long term (current) use of antithrombotics/antiplatelets: Secondary | ICD-10-CM | POA: Diagnosis not present

## 2022-07-07 DIAGNOSIS — R32 Unspecified urinary incontinence: Secondary | ICD-10-CM | POA: Diagnosis not present

## 2022-07-07 DIAGNOSIS — Z823 Family history of stroke: Secondary | ICD-10-CM | POA: Diagnosis not present

## 2022-07-07 DIAGNOSIS — Z8249 Family history of ischemic heart disease and other diseases of the circulatory system: Secondary | ICD-10-CM | POA: Diagnosis not present

## 2022-07-07 DIAGNOSIS — I10 Essential (primary) hypertension: Secondary | ICD-10-CM | POA: Diagnosis not present

## 2022-07-07 DIAGNOSIS — Z8673 Personal history of transient ischemic attack (TIA), and cerebral infarction without residual deficits: Secondary | ICD-10-CM | POA: Diagnosis not present

## 2022-07-09 DIAGNOSIS — Z8673 Personal history of transient ischemic attack (TIA), and cerebral infarction without residual deficits: Secondary | ICD-10-CM | POA: Diagnosis not present

## 2022-07-14 ENCOUNTER — Ambulatory Visit (INDEPENDENT_AMBULATORY_CARE_PROVIDER_SITE_OTHER): Payer: Medicare HMO

## 2022-07-14 DIAGNOSIS — I639 Cerebral infarction, unspecified: Secondary | ICD-10-CM

## 2022-07-15 LAB — CUP PACEART REMOTE DEVICE CHECK
Date Time Interrogation Session: 20231217231051
Implantable Pulse Generator Implant Date: 20210609

## 2022-07-16 DIAGNOSIS — M170 Bilateral primary osteoarthritis of knee: Secondary | ICD-10-CM | POA: Diagnosis not present

## 2022-07-16 DIAGNOSIS — M84374D Stress fracture, right foot, subsequent encounter for fracture with routine healing: Secondary | ICD-10-CM | POA: Diagnosis not present

## 2022-07-16 DIAGNOSIS — K2210 Ulcer of esophagus without bleeding: Secondary | ICD-10-CM | POA: Diagnosis not present

## 2022-07-16 DIAGNOSIS — R008 Other abnormalities of heart beat: Secondary | ICD-10-CM | POA: Diagnosis not present

## 2022-07-16 DIAGNOSIS — H9193 Unspecified hearing loss, bilateral: Secondary | ICD-10-CM | POA: Diagnosis not present

## 2022-07-16 DIAGNOSIS — E876 Hypokalemia: Secondary | ICD-10-CM | POA: Diagnosis not present

## 2022-07-16 DIAGNOSIS — K922 Gastrointestinal hemorrhage, unspecified: Secondary | ICD-10-CM | POA: Diagnosis not present

## 2022-07-16 DIAGNOSIS — M545 Low back pain, unspecified: Secondary | ICD-10-CM | POA: Diagnosis not present

## 2022-07-16 DIAGNOSIS — I10 Essential (primary) hypertension: Secondary | ICD-10-CM | POA: Diagnosis not present

## 2022-07-16 DIAGNOSIS — R69 Illness, unspecified: Secondary | ICD-10-CM | POA: Diagnosis not present

## 2022-07-16 DIAGNOSIS — Z8673 Personal history of transient ischemic attack (TIA), and cerebral infarction without residual deficits: Secondary | ICD-10-CM | POA: Diagnosis not present

## 2022-07-16 DIAGNOSIS — G4730 Sleep apnea, unspecified: Secondary | ICD-10-CM | POA: Diagnosis not present

## 2022-07-18 DIAGNOSIS — Z96641 Presence of right artificial hip joint: Secondary | ICD-10-CM | POA: Diagnosis not present

## 2022-07-18 DIAGNOSIS — M25562 Pain in left knee: Secondary | ICD-10-CM | POA: Diagnosis not present

## 2022-07-18 DIAGNOSIS — M170 Bilateral primary osteoarthritis of knee: Secondary | ICD-10-CM | POA: Diagnosis not present

## 2022-07-18 DIAGNOSIS — M25561 Pain in right knee: Secondary | ICD-10-CM | POA: Diagnosis not present

## 2022-07-18 DIAGNOSIS — M1712 Unilateral primary osteoarthritis, left knee: Secondary | ICD-10-CM | POA: Diagnosis not present

## 2022-07-18 DIAGNOSIS — M1711 Unilateral primary osteoarthritis, right knee: Secondary | ICD-10-CM | POA: Diagnosis not present

## 2022-07-18 DIAGNOSIS — M4180 Other forms of scoliosis, site unspecified: Secondary | ICD-10-CM | POA: Diagnosis not present

## 2022-07-23 NOTE — Progress Notes (Signed)
Carelink Summary Report / Loop Recorder 

## 2022-08-18 ENCOUNTER — Ambulatory Visit: Payer: Medicare HMO | Attending: Cardiology

## 2022-08-18 DIAGNOSIS — I639 Cerebral infarction, unspecified: Secondary | ICD-10-CM

## 2022-08-20 LAB — CUP PACEART REMOTE DEVICE CHECK
Date Time Interrogation Session: 20240119232052
Implantable Pulse Generator Implant Date: 20210609

## 2022-08-21 NOTE — Progress Notes (Signed)
[facility] Summary Report / Loop Recorder

## 2022-09-02 DIAGNOSIS — E876 Hypokalemia: Secondary | ICD-10-CM | POA: Diagnosis not present

## 2022-09-03 DIAGNOSIS — M170 Bilateral primary osteoarthritis of knee: Secondary | ICD-10-CM | POA: Diagnosis not present

## 2022-09-09 ENCOUNTER — Telehealth: Payer: Self-pay

## 2022-09-09 NOTE — Telephone Encounter (Signed)
MDT ILR alert received for AF x 6, only 1 EGM received shows SR with frequent PAC's.  More episodes for review per website with "maximum episode count as observation".  Needs manual transmission.  Routing for manual remote transmission for episode retrieval for further review of AF episodes- JJB  Have LM for patient to call back in order to get a remote transmission.  Patient appears to have hx of AF events showing SR/ ST with frequent ectopy, Dr. Curt Bears is aware (refer to 08/15/22 remote device notations). Will forward to Dr. Curt Bears in meantime for review.

## 2022-09-10 NOTE — Telephone Encounter (Signed)
LMOVM for patient to send a transmission and asked the patient to give the device clinic a call back.

## 2022-09-10 NOTE — Telephone Encounter (Signed)
Returned call to Pt.  Advised sometimes we get reports from loop recorders that do not have ECG's attached and have to do a manual transmission.  Advised we have now received her ECG's which will be reviewed and will advise if any atrial fibrillation noted.  [facility] will review.  Appear to be SR with ectopy.

## 2022-09-10 NOTE — Telephone Encounter (Signed)
Patient called back and I have walked her through to send a transmission. Patient would like a call back

## 2022-09-22 ENCOUNTER — Ambulatory Visit: Payer: Medicare HMO

## 2022-09-22 DIAGNOSIS — I639 Cerebral infarction, unspecified: Secondary | ICD-10-CM | POA: Diagnosis not present

## 2022-09-22 LAB — CUP PACEART REMOTE DEVICE CHECK
Date Time Interrogation Session: 20240221231742
Implantable Pulse Generator Implant Date: 20210609

## 2022-10-06 NOTE — Progress Notes (Signed)
Carelink Summary Report / Loop Recorder 

## 2022-10-27 ENCOUNTER — Ambulatory Visit (INDEPENDENT_AMBULATORY_CARE_PROVIDER_SITE_OTHER): Payer: Medicare HMO

## 2022-10-27 DIAGNOSIS — I639 Cerebral infarction, unspecified: Secondary | ICD-10-CM

## 2022-10-28 LAB — CUP PACEART REMOTE DEVICE CHECK
Date Time Interrogation Session: 20240331232604
Implantable Pulse Generator Implant Date: 20210609

## 2022-11-03 NOTE — Progress Notes (Signed)
Carelink Summary Report / Loop Recorder 

## 2022-11-07 DIAGNOSIS — M170 Bilateral primary osteoarthritis of knee: Secondary | ICD-10-CM | POA: Diagnosis not present

## 2022-11-11 ENCOUNTER — Telehealth: Payer: Self-pay | Admitting: *Deleted

## 2022-11-11 NOTE — Telephone Encounter (Signed)
   Pre-operative Risk Assessment    Patient Name: Doris Ford  DOB: [birth year] MRN: [id number]      Request for Surgical Clearance    Procedure:   RIGHT TOTAL KNEE ARTHROPLASTY  Date of Surgery:  Clearance 12/09/22                                 Surgeon:  DR. MATTHEW OLIN Surgeon's Group or Practice Name:  [facility] Phone number:  [phone] ATTN: Rosalva Ferron Fax number:  [phone]   Type of Clearance Requested:   - Medical  - Pharmacy:  Hold Clopidogrel (Plavix)     Type of Anesthesia:  Spinal   Additional requests/questions:    [name]   11/11/2022, 5:54 PM

## 2022-11-12 NOTE — Telephone Encounter (Signed)
   Name: Doris Ford  DOB: 04-Oct-1940  MRN: [id number]  Primary Cardiologist: None  Chart reviewed as part of pre-operative protocol coverage. Because of Doris Ford's past medical history and time since last visit, she will require a follow-up in-office visit in order to better assess preoperative cardiovascular risk.  Pre-op covering staff: - Please schedule appointment and call patient to inform them. If patient already had an upcoming appointment within acceptable timeframe, please add "pre-op clearance" to the appointment notes so provider is aware. - Please contact requesting surgeon's office via preferred method (i.e, phone, fax) to inform them of need for appointment prior to surgery.  We do no prescribe her plavix. Holding parameters will need to come from prescribing physician.   Sharlene Dory, PA-C  11/12/2022, 7:35 AM

## 2022-11-13 DIAGNOSIS — R011 Cardiac murmur, unspecified: Secondary | ICD-10-CM | POA: Diagnosis not present

## 2022-11-13 DIAGNOSIS — Z803 Family history of malignant neoplasm of breast: Secondary | ICD-10-CM | POA: Diagnosis not present

## 2022-11-13 DIAGNOSIS — Z7902 Long term (current) use of antithrombotics/antiplatelets: Secondary | ICD-10-CM | POA: Diagnosis not present

## 2022-11-13 DIAGNOSIS — I672 Cerebral atherosclerosis: Secondary | ICD-10-CM | POA: Diagnosis not present

## 2022-11-13 DIAGNOSIS — E785 Hyperlipidemia, unspecified: Secondary | ICD-10-CM | POA: Diagnosis not present

## 2022-11-13 DIAGNOSIS — Z823 Family history of stroke: Secondary | ICD-10-CM | POA: Diagnosis not present

## 2022-11-13 DIAGNOSIS — F419 Anxiety disorder, unspecified: Secondary | ICD-10-CM | POA: Diagnosis not present

## 2022-11-13 DIAGNOSIS — M1990 Unspecified osteoarthritis, unspecified site: Secondary | ICD-10-CM | POA: Diagnosis not present

## 2022-11-13 DIAGNOSIS — R69 Illness, unspecified: Secondary | ICD-10-CM | POA: Diagnosis not present

## 2022-11-13 DIAGNOSIS — Z8673 Personal history of transient ischemic attack (TIA), and cerebral infarction without residual deficits: Secondary | ICD-10-CM | POA: Diagnosis not present

## 2022-11-13 DIAGNOSIS — I10 Essential (primary) hypertension: Secondary | ICD-10-CM | POA: Diagnosis not present

## 2022-11-13 DIAGNOSIS — Z008 Encounter for other general examination: Secondary | ICD-10-CM | POA: Diagnosis not present

## 2022-11-13 DIAGNOSIS — Z8249 Family history of ischemic heart disease and other diseases of the circulatory system: Secondary | ICD-10-CM | POA: Diagnosis not present

## 2022-11-13 DIAGNOSIS — Z811 Family history of alcohol abuse and dependence: Secondary | ICD-10-CM | POA: Diagnosis not present

## 2022-11-19 NOTE — Progress Notes (Signed)
Sent message, via epic in basket, requesting orders in epic from surgeon.  

## 2022-11-25 NOTE — Patient Instructions (Signed)
DUE TO COVID-19 ONLY TWO VISITORS  (aged 84 and older)  ARE ALLOWED TO COME WITH YOU AND STAY IN THE WAITING ROOM ONLY DURING PRE OP AND PROCEDURE.   **NO VISITORS ARE ALLOWED IN THE SHORT STAY AREA OR RECOVERY ROOM!!**  IF YOU WILL BE ADMITTED INTO THE HOSPITAL YOU ARE ALLOWED ONLY FOUR SUPPORT PEOPLE DURING VISITATION HOURS ONLY (7 AM -8PM)   The support person(s) must pass our screening, gel in and out, and wear a mask at all times, including in the patient's room. Patients must also wear a mask when staff or their support person are in the room. Visitors GUEST BADGE MUST BE WORN VISIBLY  One adult visitor may remain with you overnight and MUST be in the room by 8 P.M.     Your procedure is scheduled on: 12/09/22   Report to [facility] Main Entrance    Report to admitting at : 7:30 AM   Call this number if you have problems the morning of surgery [phone]   Do not eat food :After Midnight.   After Midnight you may have the following liquids until : 7:00 AM DAY OF SURGERY  Water Black Coffee (sugar ok, NO MILK/CREAM OR CREAMERS)  Tea (sugar ok, NO MILK/CREAM OR CREAMERS) regular and decaf                             Plain Jell-O (NO RED)                                           Fruit ices (not with fruit pulp, NO RED)                                     Popsicles (NO RED)                                                                  Juice: apple, WHITE grape, WHITE cranberry Sports drinks like Gatorade (NO RED)   The day of surgery:  Drink ONE (1) Pre-Surgery Clear Ensure at : 7:00 AM the morning of surgery. Drink in one sitting. Do not sip.  This drink was given to you during your hospital  pre-op appointment visit. Nothing else to drink after completing the  Pre-Surgery Clear Ensure or G2.          If you have questions, please contact your surgeon's office.    Oral Hygiene is also important to reduce your risk of infection.                                     Remember - BRUSH YOUR TEETH THE MORNING OF SURGERY WITH YOUR REGULAR TOOTHPASTE  DENTURES WILL BE REMOVED PRIOR TO SURGERY PLEASE DO NOT APPLY "Poly grip" OR ADHESIVES!!!   Do NOT smoke after Midnight   Take these medicines the morning of surgery with A SIP OF WATER: sertraline,amlodipine.Tylenol as needed.  You may not have any metal on your body including hair pins, jewelry, and body piercing             Do not wear make-up, lotions, powders, perfumes/cologne, or deodorant  Do not wear nail polish including gel and S&S, artificial/acrylic nails, or any other type of covering on natural nails including finger and toenails. If you have artificial nails, gel coating, etc. that needs to be removed by a nail salon please have this removed prior to surgery or surgery may need to be canceled/ delayed if the surgeon/ anesthesia feels like they are unable to be safely monitored.   Do not shave  48 hours prior to surgery.    Do not bring valuables to the hospital. [facility] IS NOT             RESPONSIBLE   FOR VALUABLES.   Contacts, glasses, or bridgework may not be worn into surgery.   Bring small overnight bag day of surgery.   DO NOT BRING YOUR HOME MEDICATIONS TO THE HOSPITAL. PHARMACY WILL DISPENSE MEDICATIONS LISTED ON YOUR MEDICATION LIST TO YOU DURING YOUR ADMISSION IN THE HOSPITAL!    Patients discharged on the day of surgery will not be allowed to drive home.  Someone NEEDS to stay with you for the first 24 hours after anesthesia.   Special Instructions: Bring a copy of your healthcare power of attorney and living will documents         the day of surgery if you haven't scanned them before.              Please read over the following fact sheets you were given: IF YOU HAVE QUESTIONS ABOUT YOUR PRE-OP INSTRUCTIONS PLEASE CALL [phone]      Incentive Spirometer  An incentive spirometer is a tool that can help keep your lungs clear and active.  This tool measures how well you are filling your lungs with each breath. Taking long deep breaths may help reverse or decrease the chance of developing breathing (pulmonary) problems (especially infection) following: A long period of time when you are unable to move or be active. BEFORE THE PROCEDURE  If the spirometer includes an indicator to show your best effort, your nurse or respiratory therapist will set it to a desired goal. If possible, sit up straight or lean slightly forward. Try not to slouch. Hold the incentive spirometer in an upright position. INSTRUCTIONS FOR USE  Sit on the edge of your bed if possible, or sit up as far as you can in bed or on a chair. Hold the incentive spirometer in an upright position. Breathe out normally. Place the mouthpiece in your mouth and seal your lips tightly around it. Breathe in slowly and as deeply as possible, raising the piston or the ball toward the top of the column. Hold your breath for 3-5 seconds or for as long as possible. Allow the piston or ball to fall to the bottom of the column. Remove the mouthpiece from your mouth and breathe out normally. Rest for a few seconds and repeat Steps 1 through 7 at least 10 times every 1-2 hours when you are awake. Take your time and take a few normal breaths between deep breaths. The spirometer may include an indicator to show your best effort. Use the indicator as a goal to work toward during each repetition. After each set of 10 deep breaths, practice coughing to be sure your lungs are clear. If you have an incision (  the cut made at the time of surgery), support your incision when coughing by placing a pillow or rolled up towels firmly against it. Once you are able to get out of bed, walk around indoors and cough well. You may stop using the incentive spirometer when instructed by your caregiver.  RISKS AND COMPLICATIONS Take your time so you do not get dizzy or light-headed. If you are in pain, you may  need to take or ask for pain medication before doing incentive spirometry. It is harder to take a deep breath if you are having pain. AFTER USE Rest and breathe slowly and easily. It can be helpful to keep track of a log of your progress. Your caregiver can provide you with a simple table to help with this. If you are using the spirometer at home, follow these instructions: SEEK MEDICAL CARE IF:  You are having difficultly using the spirometer. You have trouble using the spirometer as often as instructed. Your pain medication is not giving enough relief while using the spirometer. You develop fever of 100.5 F (38.1 C) or higher. SEEK IMMEDIATE MEDICAL CARE IF:  You cough up bloody sputum that had not been present before. You develop fever of 102 F (38.9 C) or greater. You develop worsening pain at or near the incision site. MAKE SURE YOU:  Understand these instructions. Will watch your condition. Will get help right away if you are not doing well or get worse. Document Released: 11/24/2006 Document Revised: 10/06/2011 Document Reviewed: 01/25/2007 [facility] Patient Information 2014 [place], [place].   ________________________________________________________________________

## 2022-11-26 ENCOUNTER — Encounter (HOSPITAL_COMMUNITY): Payer: Self-pay

## 2022-11-26 ENCOUNTER — Other Ambulatory Visit: Payer: Self-pay

## 2022-11-26 ENCOUNTER — Encounter (HOSPITAL_COMMUNITY)
Admission: RE | Admit: 2022-11-26 | Discharge: 2022-11-26 | Disposition: A | Discharge status: Home / Self Care | Payer: Medicare HMO | Source: Ambulatory Visit | Attending: Orthopedic Surgery | Admitting: Orthopedic Surgery

## 2022-11-26 ENCOUNTER — Ambulatory Visit (INDEPENDENT_AMBULATORY_CARE_PROVIDER_SITE_OTHER): Payer: Medicare HMO

## 2022-11-26 VITALS — BP 122/70 | HR 77 | Temp 97.6°F | Ht 64.0 in | Wt 108.0 lb

## 2022-11-26 DIAGNOSIS — Z8673 Personal history of transient ischemic attack (TIA), and cerebral infarction without residual deficits: Secondary | ICD-10-CM | POA: Diagnosis not present

## 2022-11-26 DIAGNOSIS — Z853 Personal history of malignant neoplasm of breast: Secondary | ICD-10-CM | POA: Diagnosis not present

## 2022-11-26 DIAGNOSIS — G4730 Sleep apnea, unspecified: Secondary | ICD-10-CM | POA: Insufficient documentation

## 2022-11-26 DIAGNOSIS — M1711 Unilateral primary osteoarthritis, right knee: Secondary | ICD-10-CM | POA: Diagnosis not present

## 2022-11-26 DIAGNOSIS — I10 Essential (primary) hypertension: Secondary | ICD-10-CM | POA: Insufficient documentation

## 2022-11-26 DIAGNOSIS — I639 Cerebral infarction, unspecified: Secondary | ICD-10-CM

## 2022-11-26 DIAGNOSIS — Z8616 Personal history of COVID-19: Secondary | ICD-10-CM | POA: Diagnosis not present

## 2022-11-26 DIAGNOSIS — Z01818 Encounter for other preprocedural examination: Secondary | ICD-10-CM | POA: Diagnosis not present

## 2022-11-26 HISTORY — DX: Cerebral infarction, unspecified: I63.9

## 2022-11-26 LAB — BASIC METABOLIC PANEL
Anion gap: 12 (ref 5–15)
BUN: 27 mg/dL — ABNORMAL HIGH (ref 8–23)
CO2: 29 mmol/L (ref 22–32)
Calcium: 9.4 mg/dL (ref 8.9–10.3)
Chloride: 99 mmol/L (ref 98–111)
Creatinine, Ser: 0.73 mg/dL (ref 0.44–1.00)
GFR, Estimated: >60 mL/min (ref >60)
Glucose, Bld: 106 mg/dL — ABNORMAL HIGH (ref 70–99)
Potassium: 2.8 mmol/L — ABNORMAL LOW (ref 3.5–5.1)
Sodium: 140 mmol/L (ref 135–145)

## 2022-11-26 LAB — CBC
HCT: 42.8 % (ref 36.0–46.0)
Hemoglobin: 14.5 g/dL (ref 12.0–15.0)
MCH: 34.2 pg — ABNORMAL HIGH (ref 26.0–34.0)
MCHC: 33.9 g/dL (ref 30.0–36.0)
MCV: 100.9 fL — ABNORMAL HIGH (ref 80.0–100.0)
Platelets: 241 10*3/uL (ref 150–400)
RBC: 4.24 MIL/uL (ref 3.87–5.11)
RDW: 12.5 % (ref 11.5–15.5)
WBC: 8.5 10*3/uL (ref 4.0–10.5)
nRBC: 0 % (ref 0.0–0.2)

## 2022-11-26 LAB — SURGICAL PCR SCREEN
MRSA, PCR: NEGATIVE
Staphylococcus aureus: NEGATIVE

## 2022-11-26 NOTE — Progress Notes (Addendum)
For Short Stay: COVID SWAB appointment date:  Bowel Prep reminder: N/A   For Anesthesia: PCP - Dr. Nita Sells: clearance:11/18/22: chart. Cardiologist - Dr. Jorja Loa, LOV: 01/04/20 [place]: Pending,appoinment on 12/01/22 Chest x-ray -  EKG - 11/26/22 Stress Test -  ECHO - 01/01/21 Cardiac Cath -  Pacemaker/ICD device last checked: Pacemaker orders received: Device Rep notified: Loop recorder: 2021 Spinal Cord Stimulator: N/A  Sleep Study - Yes CPAP - NO  Fasting Blood Sugar - N/A Checks Blood Sugar __0___ times a day Date and result of last Hgb A1c-  Last dose of GLP1 agonist- N/A GLP1 instructions:   Last dose of SGLT-2 inhibitors- N/A SGLT-2 instructions:   Blood Thinner Instructions: Pt. Was informed that it's recommend per pharmacy to hold Plavix 5 days before surgery. Last dose: 12/03/22.Pt. is going to ask cardiologist about it on 12/01/22. Aspirin Instructions: N/A Last Dose:  Activity level: Can go up a flight of stairs and activities of daily living without stopping and without chest pain and/or shortness of breath   Able to exercise without chest pain and/or shortness of breath  Anesthesia review: Hx: HTN,Criptogenic stroke,palpitations,loop recorder,OSA(NO CPAP).  Patient denies shortness of breath, fever, cough and chest pain at PAT appointment   Patient verbalized understanding of instructions that were given to them at the PAT appointment. Patient was also instructed that they will need to review over the PAT instructions again at home before surgery.

## 2022-11-27 NOTE — Progress Notes (Signed)
Lab results: Potassium: 2.8

## 2022-12-01 ENCOUNTER — Ambulatory Visit: Payer: Medicare HMO | Admitting: Cardiology

## 2022-12-01 ENCOUNTER — Ambulatory Visit: Payer: Medicare HMO

## 2022-12-01 DIAGNOSIS — M1711 Unilateral primary osteoarthritis, right knee: Secondary | ICD-10-CM | POA: Diagnosis not present

## 2022-12-01 DIAGNOSIS — M25561 Pain in right knee: Secondary | ICD-10-CM | POA: Diagnosis not present

## 2022-12-01 LAB — CUP PACEART REMOTE DEVICE CHECK
Date Time Interrogation Session: 20240503231535
Implantable Pulse Generator Implant Date: 20210609

## 2022-12-01 NOTE — Telephone Encounter (Signed)
Sherri P. RN for Dr. Elberta Fortis called me this morning and said they had the pt on their schedule today for pre op clearance. We have never seen the pt in the office. We have only seen the pt for loop recorder implant.  I then d/w pre op APP today Carlos Levering, NP for further advice. Per pre op APP clearance will need to come from PCP or neuro as we do not follow this pt clinically. I will update the requesting office.   We have cancelled the appt for today with Dr. Elberta Fortis.

## 2022-12-01 NOTE — Progress Notes (Signed)
Anesthesia Chart Review   Case: [id number] Date/Time: 12/09/22 0950   Procedure: TOTAL KNEE ARTHROPLASTY (Right: Knee)   Anesthesia type: Spinal   Pre-op diagnosis: Right knee osteoarthritis   Location: [facility] 09 / [facility]   Surgeons: Durene Romans, MD       DISCUSSION:82 y.o. 82 yo never smoker with h/o HTN, sleep apnea mild per PCP, could not tolerate CPAP, stroke 2022, right breast cancer, right knee OA scheduled for above procedure 12/09/2022 with Dr. Durene Romans.   Pt on plavix given h/o stroke.  She has been cleared by PCP, clearance states optimized at low risk.  May hold Plavix 5 days prior to procedure.   Anticipate pt can proceed with planned procedure barring acute status change.   VS: BP 122/70   Pulse 77   Temp 36.4 C (Oral)   Ht 5\' 4"  (1.626 m)   Wt 49 kg   LMP  (LMP Unknown)   SpO2 96%   BMI 18.54 kg/m   PROVIDERS: Benita Stabile, MD is PCP    LABS: Labs reviewed: Acceptable for surgery. (all labs ordered are listed, but only abnormal results are displayed)  Labs Reviewed  BASIC METABOLIC PANEL - Abnormal; Notable for the following components:      Result Value   Potassium 2.8 (*)    Glucose, Bld 106 (*)    BUN 27 (*)    All other components within normal limits  CBC - Abnormal; Notable for the following components:   MCV 100.9 (*)    MCH 34.2 (*)    All other components within normal limits  SURGICAL PCR SCREEN     IMAGES:   EKG:   CV: Echo 01/02/2020 1. Left ventricular ejection fraction, by estimation, is 60 to 65%. The  left ventricle has normal function. The left ventricle has no regional  wall motion abnormalities. There is mild left ventricular hypertrophy.  Left ventricular diastolic parameters  are consistent with Grade I diastolic dysfunction (impaired relaxation).   2. Right ventricular systolic function is normal. The right ventricular  size is normal. There is normal pulmonary artery systolic pressure.   3. The mitral valve is  grossly normal. Trivial mitral valve  regurgitation.   4. The aortic valve is tricuspid. Aortic valve regurgitation is not  visualized.   5. The inferior vena cava is normal in size with greater than 50%  respiratory variability, suggesting right atrial pressure of 3 mmHg.   6. Trivial to small pericardial effusion. No tamponade features.   Past Medical History:  Diagnosis Date   Arthritis    Breast cancer (HCC) 06/2006   right breast/tx with lumpectomy and tamoxifen   COVID-19    Gastric ulcer    no NSAIDs   Hypertension    under control   Interstitial cystitis    Osteopenia    no change   Stroke [facility])     Past Surgical History:  Procedure Laterality Date   BILATERAL SALPINGOOPHORECTOMY  age 82s   BIOPSY  05/31/2019   Procedure: BIOPSY;  Surgeon: Corbin Ade, MD;  Location: [facility] SUITE;  Service: Endoscopy;;   BREAST SURGERY Right    lumpectomy-multiple times   BUNIONECTOMY WITH HAMMERTOE RECONSTRUCTION Bilateral ~2000   CATARACT EXTRACTION Bilateral 2007   ESOPHAGOGASTRODUODENOSCOPY N/A 05/31/2019   Dr. Jena Gauss: widely patent Schatzki ring, non-bleeding cratered ulcer with adherent clot injected/thermally sealed. motled gastric mucosa with benign biopsy negative for H.pylori. plans for 3 month surveillance EGD.   ESOPHAGOGASTRODUODENOSCOPY  N/A 10/05/2019   Procedure: ESOPHAGOGASTRODUODENOSCOPY (EGD);  Surgeon: Corbin Ade, MD;  Location: [facility];  Service: Endoscopy;  Laterality: N/A;  2:00pm - moved up per office   KNEE SURGERY Bilateral as teen   LOOP RECORDER INSERTION N/A 01/04/2020   Procedure: LOOP RECORDER INSERTION;  Surgeon: Regan Lemming, MD;  Location: [facility];  Service: Cardiovascular;  Laterality: N/A;   TONSILLECTOMY AND ADENOIDECTOMY  age 82   TOTAL HIP ARTHROPLASTY Right 12/16/2013   Procedure: RIGHT TOTAL HIP ARTHROPLASTY ANTERIOR APPROACH;  Surgeon: Kathryne Hitch, MD;  Location: [facility];  Service: Orthopedics;  Laterality:  Right;   TOTAL VAGINAL HYSTERECTOMY  age 82    MEDICATIONS:  acetaminophen (TYLENOL) 650 MG CR tablet   amLODipine (NORVASC) 5 MG tablet   Ascorbic Acid (VITAMIN C) 1000 MG tablet   atorvastatin (LIPITOR) 40 MG tablet   Calcium-Vitamin D (CALTRATE 600 PLUS-VIT D PO)   Cholecalciferol (VITAMIN D3) 50 MCG (2000 UT) TABS   clopidogrel (PLAVIX) 75 MG tablet   fluticasone (FLONASE) 50 MCG/ACT nasal spray   hydrochlorothiazide (HYDRODIURIL) 12.5 MG tablet   Polyethyl Glyc-Propyl Glyc PF (SYSTANE HYDRATION PF) 0.4-0.3 % SOLN   polyethylene glycol (MIRALAX / GLYCOLAX) 17 g packet   sertraline (ZOLOFT) 50 MG tablet   No current facility-administered medications for this encounter.    Jodell Cipro Ward, PA-C [facility] Pre-Surgical Testing [phone]

## 2022-12-04 NOTE — Progress Notes (Signed)
Carelink Summary Report / Loop Recorder 

## 2022-12-08 NOTE — H&P (Signed)
TOTAL KNEE ADMISSION H&P  Patient is being admitted for right total knee arthroplasty.  Subjective:  Chief Complaint:right knee pain.  HPI: Doris Ford, 82 y.o. female, has a history of pain and functional disability in the right knee due to arthritis and has failed non-surgical conservative treatments for greater than 12 weeks to includeNSAID's and/or analgesics and activity modification.  Onset of symptoms was gradual, starting 2 years ago with gradually worsening course since that time. The patient noted no past surgery on the right knee(s).  Patient currently rates pain in the right knee(s) at 8 out of 10 with activity. Patient has worsening of pain with activity and weight bearing.  Patient has evidence of joint space narrowing by imaging studies. There is no active infection.  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   Chronic interstitial cystitis 11/26/2011   Past Medical History:  Diagnosis Date   Arthritis    Breast cancer (HCC) 06/2006   right breast/tx with lumpectomy and tamoxifen   COVID-19    Gastric ulcer    no NSAIDs   Hypertension    under control   Interstitial cystitis    Osteopenia    no change   Stroke [facility])     Past Surgical History:  Procedure Laterality Date   BILATERAL SALPINGOOPHORECTOMY  age 30s   BIOPSY  05/31/2019   Procedure: BIOPSY;  Surgeon: Corbin Ade, MD;  Location: [facility];  Service: Endoscopy;;   BREAST SURGERY Right    lumpectomy-multiple times   BUNIONECTOMY WITH HAMMERTOE RECONSTRUCTION Bilateral ~2000    CATARACT EXTRACTION Bilateral 2007   ESOPHAGOGASTRODUODENOSCOPY N/A 05/31/2019   Dr. Jena Gauss: widely patent Schatzki ring, non-bleeding cratered ulcer with adherent clot injected/thermally sealed. motled gastric mucosa with benign biopsy negative for H.pylori. plans for 3 month surveillance EGD.   ESOPHAGOGASTRODUODENOSCOPY N/A 10/05/2019   Procedure: ESOPHAGOGASTRODUODENOSCOPY (EGD);  Surgeon: Corbin Ade, MD;  Location: [facility];  Service: Endoscopy;  Laterality: N/A;  2:00pm - moved up per office   KNEE SURGERY Bilateral as teen   LOOP RECORDER INSERTION N/A 01/04/2020   Procedure: LOOP RECORDER INSERTION;  Surgeon: Regan Lemming, MD;  Location: [facility];  Service: Cardiovascular;  Laterality: N/A;   TONSILLECTOMY AND ADENOIDECTOMY  age 89   TOTAL HIP ARTHROPLASTY Right 12/16/2013   Procedure: RIGHT TOTAL HIP ARTHROPLASTY ANTERIOR APPROACH;  Surgeon: Kathryne Hitch, MD;  Location: [facility];  Service: Orthopedics;  Laterality: Right;   TOTAL VAGINAL HYSTERECTOMY  age 82    No current facility-administered medications for this encounter.   Current Outpatient Medications  Medication Sig Dispense Refill Last Dose   acetaminophen (TYLENOL) 650 MG CR tablet Take 650-1,300 mg by mouth 2 (two) times daily as needed for pain.      amLODipine (NORVASC) 5 MG tablet Take 5 mg by mouth daily.      Ascorbic Acid (VITAMIN C) 1000 MG tablet Take 1,000 mg by mouth daily.      atorvastatin (LIPITOR) 40 MG tablet Take 1 tablet (40 mg total) by  mouth daily. 30 tablet 1    Calcium-Vitamin D (CALTRATE 600 PLUS-VIT D PO) Take 1 tablet by mouth daily.       Cholecalciferol (VITAMIN D3) 50 MCG (2000 UT) TABS Take 2,000 Units by mouth daily.      clopidogrel (PLAVIX) 75 MG tablet Take 1 tablet (75 mg total) by mouth daily. 30 tablet 1    fluticasone (FLONASE) 50 MCG/ACT nasal spray Place 2 sprays into both nostrils daily. 16 g 12    hydrochlorothiazide (HYDRODIURIL) 12.5 MG tablet Take 12.5  mg by mouth daily.      Polyethyl Glyc-Propyl Glyc PF (SYSTANE HYDRATION PF) 0.4-0.3 % SOLN Place 1 drop into both eyes daily as needed (dry eyes).      polyethylene glycol (MIRALAX / GLYCOLAX) 17 g packet Take 17 g by mouth daily as needed for mild constipation. 14 each 0    sertraline (ZOLOFT) 50 MG tablet Take 1.5 tablets (75 mg total) by mouth daily. 45 tablet 5    Allergies  Allergen Reactions   Codeine Nausea Only    Severe nausea.  Dizziness.   Macrobid [Nitrofurantoin] Other (See Comments)    Skin eruption    Social History   Tobacco Use   Smoking status: Never   Smokeless tobacco: Never  Substance Use Topics   Alcohol use: No    Family History  Problem Relation Age of Onset   Cancer Mother        breast/mastectomy   Heart disease Mother        poor circulation   Deep vein thrombosis Mother    Osteoporosis Mother    Hypertension Father    Colon cancer Neg Hx    Sleep apnea Neg Hx      Review of Systems  Constitutional:  Negative for chills and fever.  Respiratory:  Negative for cough and shortness of breath.   Cardiovascular:  Negative for chest pain.  Gastrointestinal:  Negative for nausea and vomiting.  Musculoskeletal:  Positive for arthralgias.     Objective:  Physical Exam Well nourished and well developed. General: Alert and oriented x3, cooperative and pleasant, no acute distress. Head: normocephalic, atraumatic, neck supple. Eyes: EOMI.  Musculoskeletal: Bilateral knee exams: No palpable effusions, warmth erythema Bilateral genu valgum noted Slight flexion contractures Passively correctable valgus with pain Flexion of 120 degrees with crepitation and tightness Tenderness over the lateral and anterior aspect of the joint No significant lower extremity edema, erythema or calf tenderness  Calves soft and nontender. Motor function intact in LE. Strength 5/5 LE bilaterally. Neuro: Distal pulses 2+. Sensation to light touch intact in LE.  Vital  signs in last 24 hours:    Labs:   Estimated body mass index is 18.54 kg/m as calculated from the following:   Height as of 11/26/22: 5\' 4"  (1.626 m).   Weight as of 11/26/22: 49 kg.   Imaging Review Plain radiographs demonstrate severe degenerative joint disease of the right knee(s). The overall alignment isneutral. The bone quality appears to be adequate for age and reported activity level.      Assessment/Plan:  End stage arthritis, right knee   The patient history, physical examination, clinical judgment of the provider and imaging studies are consistent with end stage degenerative joint disease of the right knee(s) and total knee arthroplasty is deemed medically necessary. The treatment options including medical management, injection therapy arthroscopy and arthroplasty were discussed at length. The risks and benefits of total knee arthroplasty were presented and reviewed. The  risks due to aseptic loosening, infection, stiffness, patella tracking problems, thromboembolic complications and other imponderables were discussed. The patient acknowledged the explanation, agreed to proceed with the plan and consent was signed. Patient is being admitted for inpatient treatment for surgery, pain control, PT, OT, prophylactic antibiotics, VTE prophylaxis, progressive ambulation and ADL's and discharge planning. The patient is planning to be discharged  home.  Therapy Plans: outpatient therapy at [facility] [place] Disposition: Home with husband & daughter Planned DVT Prophylaxis: Plavix 75 DME needed: none PCP: Dr. Margo Aye, clearance received Cardio: Dr. Elberta Fortis, appointment on Monday (hx of stroke) TXA: IV Allergies: NKDA Anesthesia Concerns: none BMI: 19.4 Last HgbA1c: Not diabetic   Other: - oxycodone, robaxin, tylenol - has a loop recorder -- ? telemetry    Patient's anticipated LOS is less than 2 midnights, meeting these requirements: - Younger than 49 - Lives within 1 hour of  care - Has a competent adult at home to recover with post-op recover - NO history of  - Chronic pain requiring opiods  - Diabetes  - Coronary Artery Disease  - Heart failure  - Heart attack  - Stroke  - DVT/VTE  - Cardiac arrhythmia  - Respiratory Failure/COPD  - Renal failure  - Anemia  - Advanced Liver disease  Rosalene Billings, PA-C Orthopedic Surgery [facility] [place] [phone]

## 2022-12-09 ENCOUNTER — Observation Stay (HOSPITAL_COMMUNITY)
Admission: RE | Admit: 2022-12-09 | Discharge: 2022-12-10 | Disposition: A | Discharge status: Home / Self Care | Payer: Medicare HMO | Source: Ambulatory Visit | Attending: Orthopedic Surgery | Admitting: Orthopedic Surgery

## 2022-12-09 ENCOUNTER — Encounter (HOSPITAL_COMMUNITY): Payer: Self-pay | Admitting: Orthopedic Surgery

## 2022-12-09 ENCOUNTER — Ambulatory Visit (HOSPITAL_COMMUNITY): Payer: Medicare HMO | Admitting: Physician Assistant

## 2022-12-09 ENCOUNTER — Other Ambulatory Visit: Payer: Self-pay

## 2022-12-09 ENCOUNTER — Encounter (HOSPITAL_COMMUNITY)
Admission: RE | Disposition: A | Discharge status: Home / Self Care | Payer: Self-pay | Source: Ambulatory Visit | Attending: Orthopedic Surgery

## 2022-12-09 ENCOUNTER — Ambulatory Visit (HOSPITAL_BASED_OUTPATIENT_CLINIC_OR_DEPARTMENT_OTHER): Payer: Medicare HMO | Admitting: Certified Registered"

## 2022-12-09 DIAGNOSIS — N189 Chronic kidney disease, unspecified: Secondary | ICD-10-CM | POA: Diagnosis not present

## 2022-12-09 DIAGNOSIS — Z96651 Presence of right artificial knee joint: Secondary | ICD-10-CM

## 2022-12-09 DIAGNOSIS — Z8673 Personal history of transient ischemic attack (TIA), and cerebral infarction without residual deficits: Secondary | ICD-10-CM | POA: Diagnosis not present

## 2022-12-09 DIAGNOSIS — M1711 Unilateral primary osteoarthritis, right knee: Secondary | ICD-10-CM | POA: Diagnosis not present

## 2022-12-09 DIAGNOSIS — I129 Hypertensive chronic kidney disease with stage 1 through stage 4 chronic kidney disease, or unspecified chronic kidney disease: Secondary | ICD-10-CM

## 2022-12-09 DIAGNOSIS — Z96641 Presence of right artificial hip joint: Secondary | ICD-10-CM | POA: Diagnosis not present

## 2022-12-09 DIAGNOSIS — Z7952 Long term (current) use of systemic steroids: Secondary | ICD-10-CM | POA: Diagnosis not present

## 2022-12-09 DIAGNOSIS — F419 Anxiety disorder, unspecified: Secondary | ICD-10-CM | POA: Diagnosis not present

## 2022-12-09 DIAGNOSIS — Z853 Personal history of malignant neoplasm of breast: Secondary | ICD-10-CM | POA: Insufficient documentation

## 2022-12-09 DIAGNOSIS — N182 Chronic kidney disease, stage 2 (mild): Secondary | ICD-10-CM | POA: Diagnosis not present

## 2022-12-09 DIAGNOSIS — Z8616 Personal history of COVID-19: Secondary | ICD-10-CM | POA: Diagnosis not present

## 2022-12-09 DIAGNOSIS — Z79899 Other long term (current) drug therapy: Secondary | ICD-10-CM | POA: Diagnosis not present

## 2022-12-09 HISTORY — PX: TOTAL KNEE ARTHROPLASTY: SHX125

## 2022-12-09 LAB — BASIC METABOLIC PANEL
Anion gap: 10 (ref 5–15)
BUN: 30 mg/dL — ABNORMAL HIGH (ref 8–23)
CO2: 29 mmol/L (ref 22–32)
Calcium: 9 mg/dL (ref 8.9–10.3)
Chloride: 97 mmol/L — ABNORMAL LOW (ref 98–111)
Creatinine, Ser: 0.9 mg/dL (ref 0.44–1.00)
GFR, Estimated: >60 mL/min (ref >60)
Glucose, Bld: 219 mg/dL — ABNORMAL HIGH (ref 70–99)
Potassium: 2.7 mmol/L — CL (ref 3.5–5.1)
Sodium: 136 mmol/L (ref 135–145)

## 2022-12-09 SURGERY — ARTHROPLASTY, KNEE, TOTAL
Anesthesia: Monitor Anesthesia Care | Site: Knee | Laterality: Right

## 2022-12-09 MED ORDER — ACETAMINOPHEN 160 MG/5ML PO SOLN: 1000.0000 mg | Freq: Once | ORAL | Status: DC | PRN

## 2022-12-09 MED ORDER — PHENYLEPHRINE HCL (PRESSORS) 10 MG/ML IV SOLN
INTRAVENOUS | Status: AC
  Filled 2022-12-09: qty 1

## 2022-12-09 MED ORDER — CEFAZOLIN SODIUM-DEXTROSE 2-4 GM/100ML-% IV SOLN
2.0000 g | Freq: Four times a day (QID) | INTRAVENOUS | Status: AC
  Administered 2022-12-09 (×2): 2 g via INTRAVENOUS
  Filled 2022-12-09 (×2): qty 100

## 2022-12-09 MED ORDER — POLYETHYLENE GLYCOL 3350 17 G PO PACK
17.0000 g | PACK | Freq: Two times a day (BID) | ORAL | Status: DC
  Administered 2022-12-09 – 2022-12-10 (×2): 17 g via ORAL
  Filled 2022-12-09 (×2): qty 1

## 2022-12-09 MED ORDER — SODIUM CHLORIDE (PF) 0.9 % IJ SOLN
INTRAMUSCULAR | Status: DC | PRN
  Administered 2022-12-09: 30 mL

## 2022-12-09 MED ORDER — ACETAMINOPHEN 10 MG/ML IV SOLN: 1000.0000 mg | Freq: Once | INTRAVENOUS | Status: DC | PRN

## 2022-12-09 MED ORDER — DIPHENHYDRAMINE HCL 12.5 MG/5ML PO ELIX: 12.5000 mg | ORAL_SOLUTION | ORAL | Status: DC | PRN

## 2022-12-09 MED ORDER — POVIDONE-IODINE 10 % EX SWAB: 2.0000 | Freq: Once | CUTANEOUS | Status: DC

## 2022-12-09 MED ORDER — OXYCODONE HCL 5 MG PO TABS
5.0000 mg | ORAL_TABLET | ORAL | Status: DC | PRN
  Administered 2022-12-09: 5 mg via ORAL
  Filled 2022-12-09: qty 1

## 2022-12-09 MED ORDER — BUPIVACAINE IN DEXTROSE 0.75-8.25 % IT SOLN
INTRATHECAL | Status: DC | PRN
  Administered 2022-12-09: 1.6 mL via INTRATHECAL

## 2022-12-09 MED ORDER — BUPIVACAINE-EPINEPHRINE (PF) 0.25% -1:200000 IJ SOLN
INTRAMUSCULAR | Status: DC | PRN
  Administered 2022-12-09: 30 mL

## 2022-12-09 MED ORDER — ONDANSETRON HCL 4 MG/2ML IJ SOLN: 4.0000 mg | Freq: Four times a day (QID) | INTRAMUSCULAR | Status: DC | PRN

## 2022-12-09 MED ORDER — MENTHOL 3 MG MT LOZG: 1.0000 | LOZENGE | OROMUCOSAL | Status: DC | PRN

## 2022-12-09 MED ORDER — AMLODIPINE BESYLATE 5 MG PO TABS
5.0000 mg | ORAL_TABLET | Freq: Every day | ORAL | Status: DC
  Filled 2022-12-09: qty 1

## 2022-12-09 MED ORDER — DEXAMETHASONE SODIUM PHOSPHATE 10 MG/ML IJ SOLN
10.0000 mg | Freq: Once | INTRAMUSCULAR | Status: AC
  Administered 2022-12-10: 10 mg via INTRAVENOUS
  Filled 2022-12-09: qty 1

## 2022-12-09 MED ORDER — ORAL CARE MOUTH RINSE: 15.0000 mL | Freq: Once | OROMUCOSAL | Status: AC

## 2022-12-09 MED ORDER — 0.9 % SODIUM CHLORIDE (POUR BTL) OPTIME
TOPICAL | Status: DC | PRN
  Administered 2022-12-09: 1000 mL

## 2022-12-09 MED ORDER — SODIUM CHLORIDE 0.9 % IV SOLN: INTRAVENOUS | Status: DC

## 2022-12-09 MED ORDER — METOCLOPRAMIDE HCL 5 MG/ML IJ SOLN: 5.0000 mg | Freq: Three times a day (TID) | INTRAMUSCULAR | Status: DC | PRN

## 2022-12-09 MED ORDER — MIDAZOLAM HCL 2 MG/2ML IJ SOLN: 1.0000 mg | INTRAMUSCULAR | Status: DC

## 2022-12-09 MED ORDER — POTASSIUM CHLORIDE 10 MEQ/100ML IV SOLN
10.0000 meq | INTRAVENOUS | Status: AC
  Administered 2022-12-09 (×2): 10 meq via INTRAVENOUS
  Filled 2022-12-09 (×2): qty 100

## 2022-12-09 MED ORDER — OXYCODONE HCL 5 MG/5ML PO SOLN: 5.0000 mg | Freq: Once | ORAL | Status: AC | PRN

## 2022-12-09 MED ORDER — ONDANSETRON HCL 4 MG PO TABS: 4.0000 mg | ORAL_TABLET | Freq: Four times a day (QID) | ORAL | Status: DC | PRN

## 2022-12-09 MED ORDER — PROPOFOL 10 MG/ML IV BOLUS
INTRAVENOUS | Status: AC
  Filled 2022-12-09: qty 20

## 2022-12-09 MED ORDER — PHENYLEPHRINE HCL-NACL 20-0.9 MG/250ML-% IV SOLN
INTRAVENOUS | Status: DC | PRN
  Administered 2022-12-09: 30 ug/min via INTRAVENOUS

## 2022-12-09 MED ORDER — TRANEXAMIC ACID-NACL 1000-0.7 MG/100ML-% IV SOLN
1000.0000 mg | Freq: Once | INTRAVENOUS | Status: AC
  Administered 2022-12-09: 1000 mg via INTRAVENOUS
  Filled 2022-12-09: qty 100

## 2022-12-09 MED ORDER — SERTRALINE HCL 50 MG PO TABS
75.0000 mg | ORAL_TABLET | Freq: Every day | ORAL | Status: DC
  Administered 2022-12-09 – 2022-12-10 (×2): 75 mg via ORAL
  Filled 2022-12-09 (×2): qty 1

## 2022-12-09 MED ORDER — SODIUM CHLORIDE (PF) 0.9 % IJ SOLN
INTRAMUSCULAR | Status: AC
  Filled 2022-12-09: qty 30

## 2022-12-09 MED ORDER — METHOCARBAMOL 500 MG PO TABS
500.0000 mg | ORAL_TABLET | Freq: Four times a day (QID) | ORAL | Status: DC | PRN
  Administered 2022-12-10: 500 mg via ORAL
  Filled 2022-12-09: qty 1

## 2022-12-09 MED ORDER — FENTANYL CITRATE PF 50 MCG/ML IJ SOSY
PREFILLED_SYRINGE | INTRAMUSCULAR | Status: AC
  Administered 2022-12-09: 25 ug via INTRAVENOUS
  Filled 2022-12-09: qty 1

## 2022-12-09 MED ORDER — HYDROMORPHONE HCL 1 MG/ML IJ SOLN: 0.5000 mg | INTRAMUSCULAR | Status: DC | PRN

## 2022-12-09 MED ORDER — FENTANYL CITRATE PF 50 MCG/ML IJ SOSY
25.0000 ug | PREFILLED_SYRINGE | INTRAMUSCULAR | Status: DC | PRN
  Administered 2022-12-09: 25 ug via INTRAVENOUS

## 2022-12-09 MED ORDER — PHENYLEPHRINE 80 MCG/ML (10ML) SYRINGE FOR IV PUSH (FOR BLOOD PRESSURE SUPPORT)
PREFILLED_SYRINGE | INTRAVENOUS | Status: DC | PRN
  Administered 2022-12-09: 80 ug via INTRAVENOUS

## 2022-12-09 MED ORDER — KETOROLAC TROMETHAMINE 30 MG/ML IJ SOLN
INTRAMUSCULAR | Status: DC | PRN
  Administered 2022-12-09: 30 mg via INTRAMUSCULAR

## 2022-12-09 MED ORDER — KETOROLAC TROMETHAMINE 30 MG/ML IJ SOLN
INTRAMUSCULAR | Status: AC
  Filled 2022-12-09: qty 1

## 2022-12-09 MED ORDER — BUPIVACAINE-EPINEPHRINE 0.25% -1:200000 IJ SOLN
INTRAMUSCULAR | Status: AC
  Filled 2022-12-09: qty 1

## 2022-12-09 MED ORDER — PHENYLEPHRINE 80 MCG/ML (10ML) SYRINGE FOR IV PUSH (FOR BLOOD PRESSURE SUPPORT)
PREFILLED_SYRINGE | INTRAVENOUS | Status: AC
  Filled 2022-12-09: qty 10

## 2022-12-09 MED ORDER — CEFAZOLIN SODIUM-DEXTROSE 2-4 GM/100ML-% IV SOLN
2.0000 g | INTRAVENOUS | Status: AC
  Administered 2022-12-09: 2 g via INTRAVENOUS
  Filled 2022-12-09: qty 100

## 2022-12-09 MED ORDER — DOCUSATE SODIUM 100 MG PO CAPS
100.0000 mg | ORAL_CAPSULE | Freq: Two times a day (BID) | ORAL | Status: DC
  Administered 2022-12-09 – 2022-12-10 (×2): 100 mg via ORAL
  Filled 2022-12-09 (×2): qty 1

## 2022-12-09 MED ORDER — ACETAMINOPHEN 10 MG/ML IV SOLN
INTRAVENOUS | Status: AC
  Administered 2022-12-09: 1000 mg via INTRAVENOUS
  Filled 2022-12-09: qty 100

## 2022-12-09 MED ORDER — DEXAMETHASONE SODIUM PHOSPHATE 10 MG/ML IJ SOLN
INTRAMUSCULAR | Status: AC
  Filled 2022-12-09: qty 1

## 2022-12-09 MED ORDER — OXYCODONE HCL 5 MG PO TABS
ORAL_TABLET | ORAL | Status: AC
  Filled 2022-12-09: qty 1

## 2022-12-09 MED ORDER — HYDROCHLOROTHIAZIDE 12.5 MG PO TABS: 12.5000 mg | ORAL_TABLET | Freq: Every day | ORAL | Status: DC

## 2022-12-09 MED ORDER — FLUTICASONE PROPIONATE 50 MCG/ACT NA SUSP: 2.0000 | Freq: Every day | NASAL | Status: DC

## 2022-12-09 MED ORDER — LACTATED RINGERS IV SOLN: INTRAVENOUS | Status: DC

## 2022-12-09 MED ORDER — ASPIRIN 81 MG PO CHEW
81.0000 mg | CHEWABLE_TABLET | Freq: Two times a day (BID) | ORAL | Status: DC
  Administered 2022-12-09 – 2022-12-10 (×2): 81 mg via ORAL
  Filled 2022-12-09 (×2): qty 1

## 2022-12-09 MED ORDER — BISACODYL 10 MG RE SUPP: 10.0000 mg | Freq: Every day | RECTAL | Status: DC | PRN

## 2022-12-09 MED ORDER — ACETAMINOPHEN 500 MG PO TABS
1000.0000 mg | ORAL_TABLET | Freq: Four times a day (QID) | ORAL | Status: AC
  Administered 2022-12-09 – 2022-12-10 (×4): 1000 mg via ORAL
  Filled 2022-12-09 (×4): qty 2

## 2022-12-09 MED ORDER — SODIUM CHLORIDE 0.9 % IV BOLUS
250.0000 mL | Freq: Once | INTRAVENOUS | Status: AC
  Administered 2022-12-09: 250 mL via INTRAVENOUS

## 2022-12-09 MED ORDER — DEXAMETHASONE SODIUM PHOSPHATE 10 MG/ML IJ SOLN
8.0000 mg | Freq: Once | INTRAMUSCULAR | Status: AC
  Administered 2022-12-09: 8 mg via INTRAVENOUS

## 2022-12-09 MED ORDER — OXYCODONE HCL 5 MG PO TABS
10.0000 mg | ORAL_TABLET | ORAL | Status: DC | PRN
  Administered 2022-12-09: 10 mg via ORAL
  Filled 2022-12-09: qty 2

## 2022-12-09 MED ORDER — METOCLOPRAMIDE HCL 5 MG PO TABS: 5.0000 mg | ORAL_TABLET | Freq: Three times a day (TID) | ORAL | Status: DC | PRN

## 2022-12-09 MED ORDER — PROPOFOL 1000 MG/100ML IV EMUL
INTRAVENOUS | Status: AC
  Filled 2022-12-09: qty 100

## 2022-12-09 MED ORDER — ONDANSETRON HCL 4 MG/2ML IJ SOLN
INTRAMUSCULAR | Status: AC
  Filled 2022-12-09: qty 2

## 2022-12-09 MED ORDER — CHLORHEXIDINE GLUCONATE 0.12 % MT SOLN
15.0000 mL | Freq: Once | OROMUCOSAL | Status: AC
  Administered 2022-12-09: 15 mL via OROMUCOSAL

## 2022-12-09 MED ORDER — ATORVASTATIN CALCIUM 40 MG PO TABS
40.0000 mg | ORAL_TABLET | Freq: Every day | ORAL | Status: DC
  Administered 2022-12-09 – 2022-12-10 (×2): 40 mg via ORAL
  Filled 2022-12-09 (×2): qty 1

## 2022-12-09 MED ORDER — PROPOFOL 500 MG/50ML IV EMUL
INTRAVENOUS | Status: DC | PRN
  Administered 2022-12-09: 80 ug/kg/min via INTRAVENOUS

## 2022-12-09 MED ORDER — ACETAMINOPHEN 325 MG PO TABS: 325.0000 mg | ORAL_TABLET | Freq: Four times a day (QID) | ORAL | Status: DC | PRN

## 2022-12-09 MED ORDER — OXYCODONE HCL 5 MG PO TABS
5.0000 mg | ORAL_TABLET | Freq: Once | ORAL | Status: AC | PRN
  Administered 2022-12-09: 5 mg via ORAL

## 2022-12-09 MED ORDER — ONDANSETRON HCL 4 MG/2ML IJ SOLN
INTRAMUSCULAR | Status: DC | PRN
  Administered 2022-12-09: 4 mg via INTRAVENOUS

## 2022-12-09 MED ORDER — TRANEXAMIC ACID-NACL 1000-0.7 MG/100ML-% IV SOLN
1000.0000 mg | INTRAVENOUS | Status: AC
  Administered 2022-12-09: 1000 mg via INTRAVENOUS
  Filled 2022-12-09: qty 100

## 2022-12-09 MED ORDER — ACETAMINOPHEN 500 MG PO TABS: 1000.0000 mg | ORAL_TABLET | Freq: Once | ORAL | Status: DC | PRN

## 2022-12-09 MED ORDER — PHENOL 1.4 % MT LIQD: 1.0000 | OROMUCOSAL | Status: DC | PRN

## 2022-12-09 MED ORDER — CLOPIDOGREL BISULFATE 75 MG PO TABS
75.0000 mg | ORAL_TABLET | Freq: Every day | ORAL | Status: DC
  Administered 2022-12-10: 75 mg via ORAL
  Filled 2022-12-09: qty 1

## 2022-12-09 MED ORDER — FENTANYL CITRATE PF 50 MCG/ML IJ SOSY: 50.0000 ug | PREFILLED_SYRINGE | INTRAMUSCULAR | Status: DC

## 2022-12-09 MED ORDER — METHOCARBAMOL 1000 MG/10ML IJ SOLN: 500.0000 mg | Freq: Four times a day (QID) | INTRAVENOUS | Status: DC | PRN

## 2022-12-09 MED ORDER — SODIUM CHLORIDE 0.9 % IR SOLN
Status: DC | PRN
  Administered 2022-12-09: 1000 mL

## 2022-12-09 SURGICAL SUPPLY — 60 items
ADH SKN CLS APL DERMABOND .7 (GAUZE/BANDAGES/DRESSINGS) ×1
ATTUNE MED ANAT PAT 32 KNEE (Knees) IMPLANT
BAG COUNTER SPONGE SURGICOUNT (BAG) IMPLANT
BAG SPEC THK2 15X12 ZIP CLS (MISCELLANEOUS)
BAG SPNG CNTER NS LX DISP (BAG)
BAG ZIPLOCK 12X15 (MISCELLANEOUS) IMPLANT
BASEPLATE TIB CMT FB PCKT SZ3 (Knees) IMPLANT
BLADE SAW SGTL 11.0X1.19X90.0M (BLADE) IMPLANT
BLADE SAW SGTL 13.0X1.19X90.0M (BLADE) ×1 IMPLANT
BNDG CMPR 5X62 HK CLSR LF (GAUZE/BANDAGES/DRESSINGS) ×1
BNDG CMPR MED 10X6 ELC LF (GAUZE/BANDAGES/DRESSINGS) ×1
BNDG ELASTIC 6INX 5YD STR LF (GAUZE/BANDAGES/DRESSINGS) ×1 IMPLANT
BNDG ELASTIC 6X10 VLCR STRL LF (GAUZE/BANDAGES/DRESSINGS) IMPLANT
BOWL SMART MIX CTS (DISPOSABLE) ×1 IMPLANT
BSPLAT TIB 3 CMNT FXBRNG STRL (Knees) ×1 IMPLANT
CATH FOLEY 2WAY SLVR  5CC 14FR (CATHETERS) ×1
CATH FOLEY 2WAY SLVR 5CC 14FR (CATHETERS) IMPLANT
CEMENT HV SMART SET (Cement) IMPLANT
COMP FEM CMT ATTUNE NRW 3 RT (Joint) ×1 IMPLANT
COMPONENT FEM CMT ATTN NRW 3RT (Joint) IMPLANT
COVER SURGICAL LIGHT HANDLE (MISCELLANEOUS) ×1 IMPLANT
CUFF TOURN SGL QUICK 34 (TOURNIQUET CUFF) ×1
CUFF TRNQT CYL 34X4.125X (TOURNIQUET CUFF) ×1 IMPLANT
DERMABOND ADVANCED .7 DNX12 (GAUZE/BANDAGES/DRESSINGS) ×1 IMPLANT
DRAPE U-SHAPE 47X51 STRL (DRAPES) ×1 IMPLANT
DRESSING AQUACEL AG SP 3.5X10 (GAUZE/BANDAGES/DRESSINGS) ×1 IMPLANT
DRSG AQUACEL AG ADV 3.5X10 (GAUZE/BANDAGES/DRESSINGS) IMPLANT
DRSG AQUACEL AG SP 3.5X10 (GAUZE/BANDAGES/DRESSINGS) ×1
DURAPREP 26ML APPLICATOR (WOUND CARE) ×2 IMPLANT
ELECT REM PT RETURN 15FT ADLT (MISCELLANEOUS) ×1 IMPLANT
GLOVE BIO SURGEON STRL SZ 6 (GLOVE) ×1 IMPLANT
GLOVE BIOGEL PI IND STRL 6.5 (GLOVE) ×1 IMPLANT
GLOVE BIOGEL PI IND STRL 7.5 (GLOVE) ×1 IMPLANT
GLOVE ORTHO TXT STRL SZ7.5 (GLOVE) ×2 IMPLANT
GOWN STRL REUS W/ TWL LRG LVL3 (GOWN DISPOSABLE) ×2 IMPLANT
GOWN STRL REUS W/TWL LRG LVL3 (GOWN DISPOSABLE) ×2
HANDPIECE INTERPULSE COAX TIP (DISPOSABLE) ×1
HOLDER FOLEY CATH W/STRAP (MISCELLANEOUS) IMPLANT
INSERT TIB ATTUNE KNEE 3 10 RT (Insert) IMPLANT
KIT TURNOVER KIT A (KITS) IMPLANT
MANIFOLD NEPTUNE II (INSTRUMENTS) ×1 IMPLANT
NDL SAFETY ECLIP 18X1.5 (MISCELLANEOUS) IMPLANT
NS IRRIG 1000ML POUR BTL (IV SOLUTION) ×1 IMPLANT
PACK TOTAL KNEE CUSTOM (KITS) ×1 IMPLANT
PIN FIX SIGMA LCS THRD HI (PIN) IMPLANT
PROTECTOR NERVE ULNAR (MISCELLANEOUS) ×1 IMPLANT
SET HNDPC FAN SPRY TIP SCT (DISPOSABLE) ×1 IMPLANT
SET PAD KNEE POSITIONER (MISCELLANEOUS) ×1 IMPLANT
SPIKE FLUID TRANSFER (MISCELLANEOUS) ×2 IMPLANT
SUT MNCRL AB 4-0 PS2 18 (SUTURE) ×1 IMPLANT
SUT STRATAFIX PDS+ 0 24IN (SUTURE) ×1 IMPLANT
SUT VIC AB 1 CT1 36 (SUTURE) ×1 IMPLANT
SUT VIC AB 2-0 CT1 27 (SUTURE) ×2
SUT VIC AB 2-0 CT1 TAPERPNT 27 (SUTURE) ×2 IMPLANT
SYR 3ML LL SCALE MARK (SYRINGE) ×1 IMPLANT
TOWEL GREEN STERILE FF (TOWEL DISPOSABLE) ×1 IMPLANT
TRAY FOLEY MTR SLVR 16FR STAT (SET/KITS/TRAYS/PACK) ×1 IMPLANT
TUBE SUCTION HIGH CAP CLEAR NV (SUCTIONS) ×1 IMPLANT
WATER STERILE IRR 1000ML POUR (IV SOLUTION) ×2 IMPLANT
WRAP KNEE MAXI GEL POST OP (GAUZE/BANDAGES/DRESSINGS) ×1 IMPLANT

## 2022-12-09 NOTE — Anesthesia Preprocedure Evaluation (Addendum)
Anesthesia Evaluation  Patient identified by MRN, date of birth, ID band Patient awake    Reviewed: Allergy & Precautions, NPO status , Patient's Chart, lab work & pertinent test results  History of Anesthesia Complications Negative for: history of anesthetic complications  Airway Mallampati: III  TM Distance: >3 FB Neck ROM: Full    Dental  (+) Dental Advisory Given, Teeth Intact, Partial Lower,    Pulmonary    breath sounds clear to auscultation       Cardiovascular hypertension, Pt. on medications (-) angina (-) Past MI and (-) CHF  Rhythm:Regular + Systolic murmurs    Neuro/Psych   Anxiety     Cva 2021  CVA, No Residual Symptoms    GI/Hepatic Neg liver ROS, PUD,,,  Endo/Other  negative endocrine ROS    Renal/GU CRFRenal diseaseLow k     Musculoskeletal  (+) Arthritis ,    Abdominal   Peds  Hematology Lab Results      Component                Value               Date                      WBC                      8.5                 11/26/2022                HGB                      14.5                11/26/2022                HCT                      42.8                11/26/2022                MCV                      100.9 (H)           11/26/2022                PLT                      241                 11/26/2022            Plavix last dose 5/6   Anesthesia Other Findings   Reproductive/Obstetrics                             Anesthesia Physical Anesthesia Plan  ASA: 3  Anesthesia Plan: MAC and Spinal   Post-op Pain Management:    Induction: Intravenous  PONV Risk Score and Plan: 2 and Propofol infusion and Treatment may vary due to age or medical condition  Airway Management Planned: Nasal Cannula and Natural Airway  Additional Equipment: None  Intra-op Plan:   Post-operative Plan:   Informed Consent: I have reviewed the patients History and Physical,  chart,  labs and discussed the procedure including the risks, benefits and alternatives for the proposed anesthesia with the patient or authorized representative who has indicated his/her understanding and acceptance.     Dental advisory given  Plan Discussed with: CRNA  Anesthesia Plan Comments:        Anesthesia Quick Evaluation

## 2022-12-09 NOTE — Progress Notes (Signed)
CRITICAL RESULT PROVIDER NOTIFICATION  Test performed and critical result:  K = 2.7  Date and time result received: 12/09/22 0848  Provider name/title: Dr. Maple Hudson  Date and time provider notified: 12/09/22 0849  Date and time provider responded: 12/09/22 0849  Provider response:En route, Per MD, will go see the patient.

## 2022-12-09 NOTE — Transfer of Care (Signed)
Immediate Anesthesia Transfer of Care Note  Patient: Doris Ford  Procedure(s) Performed: TOTAL KNEE ARTHROPLASTY (Right: Knee)  Patient Location: PACU  Anesthesia Type:Spinal  Level of Consciousness: drowsy  Airway & Oxygen Therapy: Patient Spontanous Breathing and Patient connected to face mask oxygen  Post-op Assessment: Report given to RN and Post -op Vital signs reviewed and stable  Post vital signs: Reviewed and stable  Last Vitals:  Vitals Value Taken Time  BP 94/49 12/09/22 1146  Temp    Pulse 61 12/09/22 1148  Resp 15 12/09/22 1148  SpO2 100 % 12/09/22 1148  Vitals shown include unvalidated device data.  Last Pain:  Vitals:   12/09/22 0809  TempSrc:   PainSc: 0-No pain         Complications: No notable events documented.

## 2022-12-09 NOTE — Op Note (Signed)
NAME:  Doris Ford                      MEDICAL RECORD NO.:  [id number]                             FACILITY:  [facility]      PHYSICIAN:  Madlyn Frankel. Charlann Boxer, M.D.  DATE OF BIRTH:  01-20-1941      DATE OF PROCEDURE:  12/09/2022                                     OPERATIVE REPORT         PREOPERATIVE DIAGNOSIS:  Right knee osteoarthritis.      POSTOPERATIVE DIAGNOSIS:  Right knee osteoarthritis.      FINDINGS:  The patient was noted to have complete loss of cartilage and   bone-on-bone arthritis with associated osteophytes in the lateral and patellofemoral compartments of   the knee with a significant synovitis and associated effusion.  The patient had failed months of conservative treatment including medications, injection therapy, activity modification.     PROCEDURE:  Right total knee replacement.      COMPONENTS USED:  DePuy Attune FB CR MS knee   system, a size 3N femur, 3 tibia, size 10 mm CR MS AOX insert, and 32 anatomic patellar   button.      SURGEON:  Madlyn Frankel. Charlann Boxer, M.D.      ASSISTANT:  Rosalene Billings, PA-C.      ANESTHESIA:  Regional and Spinal.      SPECIMENS:  None.      COMPLICATION:  None.      DRAINS:  None.  EBL: <250 cc      TOURNIQUET TIME:  30 min at 225 mmHg      The patient was stable to the recovery room.      INDICATION FOR PROCEDURE:  Doris Ford is a 82 y.o. female patient of   mine.  The patient had been seen, evaluated, and treated for months conservatively in the   office with medication, activity modification, and injections.  The patient had   radiographic changes of bone-on-bone arthritis with endplate sclerosis and osteophytes noted.  Based on the radiographic changes and failed conservative measures, the patient   decided to proceed with definitive treatment, total knee replacement.  Risks of infection, DVT, component failure, need for revision surgery, neurovascular injury were reviewed in the office setting.  The postop course  was reviewed stressing the efforts to maximize post-operative satisfaction and function.  Consent was obtained for benefit of pain   relief.      PROCEDURE IN DETAIL:  The patient was brought to the operative theater.   Once adequate anesthesia, preoperative antibiotics, 2 gm of Ancef,1 gm of Tranexamic Acid, and 10 mg of Decadron administered, the patient was positioned supine with a right thigh tourniquet placed.  The  right lower extremity was prepped and draped in sterile fashion.  A time-   out was performed identifying the patient, planned procedure, and the appropriate extremity.      The right lower extremity was placed in the [facility] leg holder.  The leg was   exsanguinated, tourniquet elevated to 225 mmHg.  A midline incision was   made followed by median parapatellar arthrotomy.  Following initial   exposure, attention  was first directed to the patella.  Precut   measurement was noted to be 19 mm.  I resected down to 13 mm and used a   32 anatomic patellar button to restore patellar height as well as cover the cut surface.      The lug holes were drilled and a metal shim was placed to protect the   patella from retractors and saw blade during the procedure.      At this point, attention was now directed to the femur.  The femoral   canal was opened with a drill, irrigated to try to prevent fat emboli.  An   intramedullary rod was passed at 3 degrees valgus,11 mm of bone was   resected off the distal femur.  Following this resection, the tibia was   subluxated anteriorly.  Using the extramedullary guide, 4 mm of bone was resected off   the proximal medial tibia.  We confirmed the gap would be   stable medially and laterally with a size 6 spacer block as well as confirmed that the tibial cut was perpendicular in the coronal plane, checking with an alignment rod.      Once this was done, I sized the femur to be a size 3 in the anterior-   posterior dimension, chose a narrow  component based on medial and   lateral dimension.  The size 3 rotation block was then pinned in   position anterior referenced using the C-clamp to set rotation.  The   anterior, posterior, and  chamfer cuts were made without difficulty nor   notching making certain that I was along the anterior cortex to help   with flexion gap stability.      The final box cshim was made off the lateral aspect of distal femur.      At this point, the tibia was sized to be a size 3.  The size 3 tray was   then pinned in position through after setting rotation floating a trial tibial tray then drilled, and keel punched.  Trial reduction was now carried with a 3 femur,  3 tibia, a size 10 mm CR MS insert, and the 32 anatomic patella botton.  The knee was brought to full extension with good flexion stability with the patella   tracking through the trochlea without application of pressure.  Given   all these findings the trial components removed.  Final components were   opened and cement was mixed.  The knee was irrigated with normal saline solution and pulse lavage.  The synovial lining was   then injected with 30 cc of 0.25% Marcaine with epinephrine, 1 cc of Toradol and 30 cc of NS for a total of 61 cc.     Final implants were then cemented onto cleaned and dried cut surfaces of bone with the knee brought to extension with a size 10 mm CR MS trial insert.      Once the cement had fully cured, excess cement was removed   throughout the knee.  I confirmed that I was satisfied with the range of   motion and stability, and the final size 10 mm CR MS AOX insert was chosen.  It was   placed into the knee.      The tourniquet had been let down at 30 minutes.  No significant   hemostasis was required.  The extensor mechanism was then reapproximated using #1 Vicryl and #1 Stratafix sutures with the knee   in  flexion.  The   remaining wound was closed with 2-0 Vicryl and running 4-0 Monocryl.   The knee was  cleaned, dried, dressed sterilely using Dermabond and   Aquacel dressing.  The patient was then   brought to recovery room in stable condition, tolerating the procedure   well.   Please note that Physician Assistant, Rosalene Billings, PA-C was present for the entirety of the case, and was utilized for pre-operative positioning, peri-operative retractor management, general facilitation of the procedure and for primary wound closure at the end of the case.              Madlyn Frankel Charlann Boxer, M.D.    12/09/2022 8:46 AM

## 2022-12-09 NOTE — Discharge Instructions (Signed)

## 2022-12-09 NOTE — Anesthesia Procedure Notes (Signed)
Procedure Name: MAC Date/Time: 12/09/2022 9:56 AM  Performed by: Nelle Don, CRNAPre-anesthesia Checklist: Patient identified, Emergency Drugs available, Suction available and Patient being monitored Oxygen Delivery Method: Simple face mask

## 2022-12-09 NOTE — Anesthesia Procedure Notes (Addendum)
Spinal  Patient location during procedure: OR Start time: 12/09/2022 10:01 AM End time: 12/09/2022 10:15 AM Reason for block: surgical anesthesia Staffing Performed: anesthesiologist  Anesthesiologist: Val Eagle, MD Performed by: Val Eagle, MD Authorized by: Val Eagle, MD   Preanesthetic Checklist Completed: patient identified, IV checked, risks and benefits discussed, surgical consent, monitors and equipment checked, pre-op evaluation and timeout performed Spinal Block Patient position: sitting Prep: DuraPrep Patient monitoring: heart rate, cardiac monitor, continuous pulse ox and blood pressure Approach: midline Location: L4-5 Injection technique: single-shot Needle Needle type: Pencan  Needle gauge: 24 G Needle length: 9 cm Assessment Sensory level: T6 Events: CSF return and second provider Additional Notes Patient with marked scoliosis. Attempt by CRNA (L3/4) and myself (L5/S1) without success. Dr Tacy Dura consulted and assisted with success at L4/L5

## 2022-12-09 NOTE — Interval H&P Note (Signed)
History and Physical Interval Note:  12/09/2022 7:42 AM  Doris Ford  has presented today for surgery, with the diagnosis of Right knee osteoarthritis.  The various methods of treatment have been discussed with the patient and family. After consideration of risks, benefits and other options for treatment, the patient has consented to  Procedure(s): TOTAL KNEE ARTHROPLASTY (Right) as a surgical intervention.  The patient's history has been reviewed, patient examined, no change in status, stable for surgery.  I have reviewed the patient's chart and labs.  Questions were answered to the patient's satisfaction.     Shelda Pal

## 2022-12-09 NOTE — Care Plan (Signed)
Ortho Bundle Case Management Note  Patient Details  Name: Doris Ford MRN: [id number] Date of Birth: Apr 21, 1941                  R TKA on 12/09/22. DCP: Home with husband and daughter. DME: No needs. Has RW. PT: Kevan Ny 5/17   DME Arranged:  N/A DME Agency:      Additional Comments: Please contact me with any questions of if this plan should need to change.    Despina Pole, Case Manager  [facility]  [phone] 12/09/2022, 3:42 PM

## 2022-12-09 NOTE — Evaluation (Signed)
Physical Therapy Evaluation Patient Details Name: Doris Ford MRN: [id number] DOB: Aug 25, 1940 Today's Date: 12/09/2022  History of Present Illness  82 yo female s/p R TKA 12/09/22. 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 She walked ~70 feet with a RW. Moderate pain with activity. Will plan to follow and progress activity as tolerated. Plan is for possible d/c home on tomorrow.       Recommendations for follow up therapy are one component of a multi-disciplinary discharge planning process, led by the attending physician.  Recommendations may be updated based on patient status, additional functional criteria and insurance authorization.  Follow Up Recommendations       Assistance Recommended at Discharge Intermittent Supervision/Assistance  Patient can return home with the following  A little help with walking and/or transfers;A little help with bathing/dressing/bathroom;Assistance with cooking/housework;Assist for transportation;Help with stairs or ramp for entrance    Equipment Recommendations None recommended by PT  Recommendations for Other Services       Functional Status Assessment Patient has had a recent decline in their functional status and demonstrates the ability to make significant improvements in function in a reasonable and predictable amount of time.     Precautions / Restrictions Precautions Precautions: Fall Restrictions Weight Bearing Restrictions: No Other Position/Activity Restrictions: wbat      Mobility  Bed Mobility Overal bed mobility: Needs Assistance Bed Mobility: Supine to Sit     Supine to sit: Min assist, HOB elevated     General bed mobility comments: Assist for R LE.    Transfers Overall transfer level: Needs assistance Equipment used: Rolling walker (2 wheels) Transfers: Sit to/from Stand Sit to Stand: Min assist           General transfer  comment: Assist to rise, steady. Cues for safety, hand placement.    Ambulation/Gait Ambulation/Gait assistance: Min assist Gait Distance (Feet): 70 Feet Assistive device: Rolling walker (2 wheels) Gait Pattern/deviations: Step-to pattern       General Gait Details: Assist to steady throughout distance. Pt tolerated distance well. Cues for safety, technique, sequence  Stairs            Wheelchair Mobility    Modified Rankin (Stroke Patients Only)       Balance Overall balance assessment: Needs assistance         Standing balance support: Reliant on assistive device for balance, During functional activity Standing balance-Leahy Scale: Poor                               Pertinent Vitals/Pain Pain Assessment Pain Assessment: Faces Faces Pain Scale: Hurts little more Pain Location: R knee Pain Descriptors / Indicators: Discomfort, Sore Pain Intervention(s): Limited activity within patient's tolerance, Monitored during session, Ice applied, Repositioned    Home Living Family/patient expects to be discharged to:: Private residence Living Arrangements: Spouse/significant other;Children Available Help at Discharge: Family Type of Home: House Home Access: Stairs to enter Entrance Stairs-Rails: Doctor, general practice of Steps: 2   Home Layout: One level Home Equipment: Cane - Programmer, applications (2 wheels)      Prior Function Prior Level of Function : Independent/Modified Independent             Mobility Comments: uses quad cane for ambulation       Hand Dominance  Extremity/Trunk Assessment   Upper Extremity Assessment Upper Extremity Assessment: Overall WFL for tasks assessed    Lower Extremity Assessment Lower Extremity Assessment: Generalized weakness    Cervical / Trunk Assessment Cervical / Trunk Assessment: Normal  Communication   Communication: No difficulties  Cognition Arousal/Alertness:  Awake/alert Behavior During Therapy: WFL for tasks assessed/performed Overall Cognitive Status: Within Functional Limits for tasks assessed                                          General Comments      Exercises     Assessment/Plan    PT Assessment Patient needs continued PT services  PT Problem List Decreased strength;Decreased range of motion;Decreased activity tolerance;Decreased balance;Decreased mobility;Decreased knowledge of use of DME;Pain       PT Treatment Interventions DME instruction;Gait training;Therapeutic exercise;Balance training;Stair training;Functional mobility training;Therapeutic activities;Patient/family education    PT Goals (Current goals can be found in the Care Plan section)  Acute Rehab PT Goals Patient Stated Goal: home. regain PLOF/independence PT Goal Formulation: With patient/family Time For Goal Achievement: 12/23/22 Potential to Achieve Goals: Good    Frequency 7X/week     Co-evaluation               AM-PAC PT "6 Clicks" Mobility  Outcome Measure Help needed turning from your back to your side while in a flat bed without using bedrails?: A Little Help needed moving from lying on your back to sitting on the side of a flat bed without using bedrails?: A Little Help needed moving to and from a bed to a chair (including a wheelchair)?: A Little Help needed standing up from a chair using your arms (e.g., wheelchair or bedside chair)?: A Little Help needed to walk in hospital room?: A Little Help needed climbing 3-5 steps with a railing? : A Little 6 Click Score: 18    End of Session Equipment Utilized During Treatment: Gait belt Activity Tolerance: Patient tolerated treatment well Patient left: in chair;with call bell/phone within reach;with chair alarm set;with family/visitor present   PT Visit Diagnosis: Pain;Other abnormalities of gait and mobility (R26.89) Pain - Right/Left: Right Pain - part of body: Knee     Time: 1651-1711 PT Time Calculation (min) (ACUTE ONLY): 20 min   Charges:   PT Evaluation $PT Eval Low Complexity: 1 Low             Faye Ramsay, PT Acute Rehabilitation  Office: [phone]

## 2022-12-10 ENCOUNTER — Encounter (HOSPITAL_COMMUNITY): Payer: Self-pay | Admitting: Orthopedic Surgery

## 2022-12-10 DIAGNOSIS — I129 Hypertensive chronic kidney disease with stage 1 through stage 4 chronic kidney disease, or unspecified chronic kidney disease: Secondary | ICD-10-CM | POA: Diagnosis not present

## 2022-12-10 DIAGNOSIS — Z853 Personal history of malignant neoplasm of breast: Secondary | ICD-10-CM | POA: Diagnosis not present

## 2022-12-10 DIAGNOSIS — Z96641 Presence of right artificial hip joint: Secondary | ICD-10-CM | POA: Diagnosis not present

## 2022-12-10 DIAGNOSIS — Z8673 Personal history of transient ischemic attack (TIA), and cerebral infarction without residual deficits: Secondary | ICD-10-CM | POA: Diagnosis not present

## 2022-12-10 DIAGNOSIS — Z8616 Personal history of COVID-19: Secondary | ICD-10-CM | POA: Diagnosis not present

## 2022-12-10 DIAGNOSIS — Z79899 Other long term (current) drug therapy: Secondary | ICD-10-CM | POA: Diagnosis not present

## 2022-12-10 DIAGNOSIS — N182 Chronic kidney disease, stage 2 (mild): Secondary | ICD-10-CM | POA: Diagnosis not present

## 2022-12-10 DIAGNOSIS — Z7952 Long term (current) use of systemic steroids: Secondary | ICD-10-CM | POA: Diagnosis not present

## 2022-12-10 DIAGNOSIS — M1711 Unilateral primary osteoarthritis, right knee: Secondary | ICD-10-CM | POA: Diagnosis not present

## 2022-12-10 LAB — BASIC METABOLIC PANEL
Anion gap: 10 (ref 5–15)
BUN: 32 mg/dL — ABNORMAL HIGH (ref 8–23)
CO2: 29 mmol/L (ref 22–32)
Calcium: 8.1 mg/dL — ABNORMAL LOW (ref 8.9–10.3)
Chloride: 97 mmol/L — ABNORMAL LOW (ref 98–111)
Creatinine, Ser: 0.95 mg/dL (ref 0.44–1.00)
GFR, Estimated: 59 mL/min — ABNORMAL LOW (ref >60)
Glucose, Bld: 183 mg/dL — ABNORMAL HIGH (ref 70–99)
Potassium: 3.2 mmol/L — ABNORMAL LOW (ref 3.5–5.1)
Sodium: 136 mmol/L (ref 135–145)

## 2022-12-10 LAB — CBC
HCT: 31.2 % — ABNORMAL LOW (ref 36.0–46.0)
Hemoglobin: 10.6 g/dL — ABNORMAL LOW (ref 12.0–15.0)
MCH: 34.9 pg — ABNORMAL HIGH (ref 26.0–34.0)
MCHC: 34 g/dL (ref 30.0–36.0)
MCV: 102.6 fL — ABNORMAL HIGH (ref 80.0–100.0)
Platelets: 180 10*3/uL (ref 150–400)
RBC: 3.04 MIL/uL — ABNORMAL LOW (ref 3.87–5.11)
RDW: 12.4 % (ref 11.5–15.5)
WBC: 12.7 10*3/uL — ABNORMAL HIGH (ref 4.0–10.5)
nRBC: 0 % (ref 0.0–0.2)

## 2022-12-10 MED ORDER — METHOCARBAMOL 500 MG PO TABS: 500.0000 mg | ORAL_TABLET | Freq: Four times a day (QID) | ORAL | 2 refills | Status: DC | PRN

## 2022-12-10 MED ORDER — OXYCODONE HCL 5 MG PO TABS: 5.0000 mg | ORAL_TABLET | ORAL | 0 refills | Status: DC | PRN

## 2022-12-10 MED ORDER — SENNA 8.6 MG PO TABS: 2.0000 | ORAL_TABLET | Freq: Every day | ORAL | 0 refills | Status: AC

## 2022-12-10 MED ORDER — POTASSIUM CHLORIDE CRYS ER 20 MEQ PO TBCR: 20.0000 meq | EXTENDED_RELEASE_TABLET | Freq: Every day | ORAL | 0 refills | Status: AC

## 2022-12-10 MED ORDER — POTASSIUM CHLORIDE CRYS ER 20 MEQ PO TBCR
20.0000 meq | EXTENDED_RELEASE_TABLET | Freq: Every day | ORAL | Status: DC
  Administered 2022-12-10: 20 meq via ORAL
  Filled 2022-12-10: qty 1

## 2022-12-10 NOTE — Progress Notes (Signed)
   Subjective: 1 Day Post-Op Procedure(s) (LRB): TOTAL KNEE ARTHROPLASTY (Right) Patient reports pain as mild.   Patient seen in rounds with Dr. Charlann Boxer. Patient is well, and has had no acute complaints or problems. No acute events overnight. Foley catheter removed. Patient ambulated 70 feet with PT.  We will start therapy today.   Objective: Vital signs in last 24 hours: Temp:  [97.8 F (36.6 C)-98.7 F (37.1 C)] 98 F (36.7 C) (05/15 0530) Pulse Rate:  [62-80] 70 (05/15 0530) Resp:  [10-19] 17 (05/15 0530) BP: (94-116)/(47-59) 116/57 (05/15 0530) SpO2:  [89 %-98 %] 89 % (05/15 0530) Weight:  [52.6 kg] 52.6 kg (05/14 0809)  Intake/Output from previous day:  Intake/Output Summary (Last 24 hours) at 12/10/2022 0807 Last data filed at 12/10/2022 0645 Gross per 24 hour  Intake 2165.57 ml  Output 825 ml  Net 1340.57 ml     Intake/Output this shift: No intake/output data recorded.  Labs: Recent Labs    12/10/22 0323  HGB 10.6*   Recent Labs    12/10/22 0323  WBC 12.7*  RBC 3.04*  HCT 31.2*  PLT 180   Recent Labs    12/09/22 0757 12/10/22 0323  NA 136 136  K 2.7* 3.2*  CL 97* 97*  CO2 29 29  BUN 30* 32*  CREATININE 0.90 0.95  GLUCOSE 219* 183*  CALCIUM 9.0 8.1*   No results for input(s): "LABPT", "INR" in the last 72 hours.  Exam: General - Patient is Alert and Oriented Extremity - Neurologically intact Sensation intact distally Intact pulses distally Dorsiflexion/Plantar flexion intact Dressing - dressing C/D/I Motor Function - intact, moving foot and toes well on exam.   Past Medical History:  Diagnosis Date   Arthritis    Breast cancer (HCC) 06/2006   right breast/tx with lumpectomy and tamoxifen   COVID-19    Gastric ulcer    no NSAIDs   Hypertension    under control   Interstitial cystitis    Osteopenia    no change   Stroke (HCC)     Assessment/Plan: 1 Day Post-Op Procedure(s) (LRB): TOTAL KNEE ARTHROPLASTY (Right) Principal  Problem:   S/P total knee arthroplasty, right  Estimated body mass index is 19.91 kg/m as calculated from the following:   Height as of this encounter: 5\' 4"  (1.626 m).   Weight as of this encounter: 52.6 kg. Advance diet Up with therapy D/C IV fluids   Patient's anticipated LOS is less than 2 midnights, meeting these requirements: - Younger than 57 - Lives within 1 hour of care - Has a competent adult at home to recover with post-op recover - NO history of  - Chronic pain requiring opiods  - Diabetes  - Coronary Artery Disease  - Heart failure  - Heart attack  - Stroke  - DVT/VTE  - Cardiac arrhythmia  - Respiratory Failure/COPD  - Renal failure  - Anemia  - Advanced Liver disease     DVT Prophylaxis - Aspirin Weight bearing as tolerated.  Hgb stable at 10.6 this AM. K 3.2 - will give 20 mEq daily and have her follow up with PCP in the next week which we discussed  Plan is to go Home after hospital stay. Plan for discharge today following 1-2 sessions of PT as long as they are meeting their goals. Patient is scheduled for OPPT. Follow up in the office in 2 weeks.   Dennie Bible, PA-C Orthopedic Surgery [phone] 12/10/2022, 8:07 AM

## 2022-12-10 NOTE — Progress Notes (Signed)
Physical Therapy Treatment Patient Details Name: Doris Ford MRN: [id number] DOB: 1940/07/30 Today's Date: 12/10/2022   History of Present Illness 82 yo female s/p R TKA 12/09/22. 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 Will plan to have a 2nd session to practice stairs prior to d/c home later today.   Recommendations for follow up therapy are one component of a multi-disciplinary discharge planning process, led by the attending physician.  Recommendations may be updated based on patient status, additional functional criteria and insurance authorization.  Follow Up Recommendations       Assistance Recommended at Discharge Intermittent Supervision/Assistance  Patient can return home with the following A little help with walking and/or transfers;A little help with bathing/dressing/bathroom;Assistance with cooking/housework;Assist for transportation;Help with stairs or ramp for entrance   Equipment Recommendations  None recommended by PT    Recommendations for Other Services       Precautions / Restrictions Precautions Precautions: Fall Restrictions Weight Bearing Restrictions: No Other Position/Activity Restrictions: wbat     Mobility  Bed Mobility Overal bed mobility: Needs Assistance Bed Mobility: Supine to Sit     Supine to sit: Min guard, HOB elevated     General bed mobility comments: Increased time.    Transfers Overall transfer level: Needs assistance Equipment used: Rolling walker (2 wheels) Transfers: Sit to/from Stand Sit to Stand: Min guard           General transfer comment: Min guard for safety. Cues for safety, hand placement.    Ambulation/Gait Ambulation/Gait assistance: Min guard Gait Distance (Feet): 135 Feet Assistive device: Rolling walker (2 wheels) Gait Pattern/deviations: Step-through pattern, Decreased stride length       General Gait Details: Min guard A. Pt  tolerated distance well. Cues for safety, technique, sequence, step length   Stairs             Wheelchair Mobility    Modified Rankin (Stroke Patients Only)       Balance Overall balance assessment: Needs assistance         Standing balance support: Reliant on assistive device for balance, During functional activity Standing balance-Leahy Scale: Poor                              Cognition Arousal/Alertness: Awake/alert Behavior During Therapy: WFL for tasks assessed/performed Overall Cognitive Status: Within Functional Limits for tasks assessed                                          Exercises Total Joint Exercises Ankle Circles/Pumps: AROM, Both, 10 reps Quad Sets: AROM, Right, 10 reps Heel Slides: AROM, Right, 10 reps Hip ABduction/ADduction: AROM, Right, 10 reps Straight Leg Raises: AROM, Right, 10 reps Goniometric ROM: ~19-85 degrees    General Comments        Pertinent Vitals/Pain Pain Assessment Pain Assessment: Faces Faces Pain Scale: Hurts little more Pain Location: R knee Pain Descriptors / Indicators: Discomfort, Sore Pain Intervention(s): Monitored during session, Ice applied, Repositioned    Home Living                          Prior Function            PT Goals (  current goals can now be found in the care plan section) Progress towards PT goals: Progressing toward goals    Frequency    7X/week      PT Plan Current plan remains appropriate    Co-evaluation              AM-PAC PT "6 Clicks" Mobility   Outcome Measure  Help needed turning from your back to your side while in a flat bed without using bedrails?: None Help needed moving from lying on your back to sitting on the side of a flat bed without using bedrails?: None Help needed moving to and from a bed to a chair (including a wheelchair)?: A Little Help needed standing up from a chair using your arms (e.g., wheelchair or  bedside chair)?: A Little Help needed to walk in hospital room?: A Little Help needed climbing 3-5 steps with a railing? : A Little 6 Click Score: 20    End of Session Equipment Utilized During Treatment: Gait belt Activity Tolerance: Patient tolerated treatment well Patient left: in chair;with call bell/phone within reach   PT Visit Diagnosis: Pain;Other abnormalities of gait and mobility (R26.89) Pain - Right/Left: Right Pain - part of body: Knee     Time: 1610-9604 PT Time Calculation (min) (ACUTE ONLY): 27 min  Charges:  $Gait Training: 8-22 mins $Therapeutic Exercise: 8-22 mins                        Faye Ramsay, PT Acute Rehabilitation  Office: [phone]

## 2022-12-10 NOTE — TOC Transition Note (Signed)
Transition of Care [facility]) - CM/SW Discharge Note   Patient Details  Name: Doris Ford MRN: [id number] Date of Birth: 09/19/1940  Transition of Care [facility]) CM/SW Contact:  Amada Jupiter, LCSW Phone Number: 12/10/2022, 1:13 PM   Clinical Narrative:     Met with pt and confirming she has needed DME at home.  OPPT already arranged with [facility] ([place]).  No further TOC needs.  Final next level of care: OP Rehab Barriers to Discharge: No Barriers Identified   Patient Goals and CMS Choice      Discharge Placement                         Discharge Plan and Services Additional resources added to the After Visit Summary for                  DME Arranged: N/A                    Social Determinants of Health (SDOH) Interventions SDOH Screenings   Food Insecurity: No Food Insecurity (12/09/2022)  Housing: Low Risk  (12/09/2022)  Transportation Needs: No Transportation Needs (12/09/2022)  Utilities: Not At Risk (12/09/2022)  Depression (PHQ2-9): Medium Risk (01/22/2021)  Tobacco Use: Low Risk  (12/10/2022)     Readmission Risk Interventions     No data to display

## 2022-12-10 NOTE — Progress Notes (Signed)
Physical Therapy Treatment Patient Details Name: Doris Ford MRN: [id number] DOB: 05/07/1941 Today's Date: 12/10/2022   History of Present Illness 82 yo female s/p R TKA 12/09/22. 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 Recommended pt perform SLRs and heel slides as tolerated. Encouraged her to ambulate often at home as tolerated. All PT education completed.    Recommendations for follow up therapy are one component of a multi-disciplinary discharge planning process, led by the attending physician.  Recommendations may be updated based on patient status, additional functional criteria and insurance authorization.  Follow Up Recommendations       Assistance Recommended at Discharge Intermittent Supervision/Assistance  Patient can return home with the following A little help with walking and/or transfers;A little help with bathing/dressing/bathroom;Assistance with cooking/housework;Assist for transportation;Help with stairs or ramp for entrance   Equipment Recommendations  None recommended by PT    Recommendations for Other Services       Precautions / Restrictions Precautions Precautions: Fall Restrictions Weight Bearing Restrictions: No Other Position/Activity Restrictions: wbat     Mobility  Bed Mobility Overal bed mobility: Needs Assistance Bed Mobility: Supine to Sit     Supine to sit: Min guard, HOB elevated     General bed mobility comments: oob in recliner    Transfers Overall transfer level: Needs assistance Equipment used: Rolling walker (2 wheels) Transfers: Sit to/from Stand Sit to Stand: Min guard           General transfer comment: Min guard for safety. Cues for safety, hand placement.    Ambulation/Gait Ambulation/Gait assistance: Min guard Gait Distance (Feet): 100 Feet Assistive device: Rolling walker (2 wheels) Gait Pattern/deviations: Step-through pattern,  Decreased stride length       General Gait Details: Min guard A. Pt tolerated distance well. Cues for safety, technique, sequence, step length   Stairs Stairs: Yes Stairs assistance: Min guard Stair Management: Step to pattern, Forwards Number of Stairs: 2 General stair comments: up and over portable stairs x 1. cues for safety, technique, sequencing. Pt has 2 rails but they may be a bit far apart. Practiced using 2 hands on 1 rail and also using 2 rails.   Wheelchair Mobility    Modified Rankin (Stroke Patients Only)       Balance Overall balance assessment: Needs assistance         Standing balance support: Reliant on assistive device for balance, During functional activity Standing balance-Leahy Scale: Poor                              Cognition Arousal/Alertness: Awake/alert Behavior During Therapy: WFL for tasks assessed/performed Overall Cognitive Status: Within Functional Limits for tasks assessed                                          Exercises Total Joint Exercises Ankle Circles/Pumps: AROM, Both, 10 reps Quad Sets: AROM, Right, 10 reps Heel Slides: AROM, Right, 10 reps Hip ABduction/ADduction: AROM, Right, 10 reps Straight Leg Raises: AROM, Right, 10 reps Goniometric ROM: ~19-85 degrees    General Comments        Pertinent Vitals/Pain Pain Assessment Pain Assessment: Faces Faces Pain Scale: Hurts a little bit Pain Location: R knee Pain Descriptors /  Indicators: Discomfort, Sore Pain Intervention(s): Monitored during session    Home Living                          Prior Function            PT Goals (current goals can now be found in the care plan section) Progress towards PT goals: Progressing toward goals    Frequency    7X/week      PT Plan Current plan remains appropriate    Co-evaluation              AM-PAC PT "6 Clicks" Mobility   Outcome Measure  Help needed turning from  your back to your side while in a flat bed without using bedrails?: None Help needed moving from lying on your back to sitting on the side of a flat bed without using bedrails?: None Help needed moving to and from a bed to a chair (including a wheelchair)?: A Little Help needed standing up from a chair using your arms (e.g., wheelchair or bedside chair)?: A Little Help needed to walk in hospital room?: A Little Help needed climbing 3-5 steps with a railing? : A Little 6 Click Score: 20    End of Session Equipment Utilized During Treatment: Gait belt Activity Tolerance: Patient tolerated treatment well Patient left: in chair;with call bell/phone within reach;with family/visitor present   PT Visit Diagnosis: Pain;Other abnormalities of gait and mobility (R26.89) Pain - Right/Left: Right Pain - part of body: Knee     Time: 1340-1359 PT Time Calculation (min) (ACUTE ONLY): 19 min  Charges:  $Gait Training: 8-22 mins $Therapeutic Exercise: 8-22 mins                         Faye Ramsay, PT Acute Rehabilitation  Office: [phone]

## 2022-12-10 NOTE — Progress Notes (Signed)
Patient has ted hose on, provided with discharge education on medications. IV removed. Patient is discharged.

## 2022-12-12 ENCOUNTER — Encounter (HOSPITAL_COMMUNITY): Payer: Self-pay | Admitting: Orthopedic Surgery

## 2022-12-12 DIAGNOSIS — M25661 Stiffness of right knee, not elsewhere classified: Secondary | ICD-10-CM | POA: Diagnosis not present

## 2022-12-12 DIAGNOSIS — M25561 Pain in right knee: Secondary | ICD-10-CM | POA: Diagnosis not present

## 2022-12-12 NOTE — Anesthesia Postprocedure Evaluation (Addendum)
Anesthesia Post Note  Patient: MATSUE PICCO  Procedure(s) Performed: TOTAL KNEE ARTHROPLASTY (Right: Knee)     Patient location during evaluation: PACU Anesthesia Type: MAC and Spinal Level of consciousness: awake and alert Pain management: pain level controlled Vital Signs Assessment: post-procedure vital signs reviewed and stable Respiratory status: spontaneous breathing, nonlabored ventilation, respiratory function stable and patient connected to nasal cannula oxygen Cardiovascular status: stable and blood pressure returned to baseline Postop Assessment: no apparent nausea or vomiting Anesthetic complications: no   No notable events documented.  Last Vitals:  Vitals:   12/10/22 1343 12/10/22 1353  BP: (!) 112/50 (!) 112/50  Pulse: 71 71  Resp: 16 16  Temp: 37.2 C 37.2 C  SpO2: 94% 94%    Last Pain:  Vitals:   12/10/22 1353  TempSrc: Oral  PainSc:                  Giulio Bertino

## 2022-12-15 DIAGNOSIS — M25561 Pain in right knee: Secondary | ICD-10-CM | POA: Diagnosis not present

## 2022-12-15 DIAGNOSIS — M25661 Stiffness of right knee, not elsewhere classified: Secondary | ICD-10-CM | POA: Diagnosis not present

## 2022-12-16 ENCOUNTER — Encounter (HOSPITAL_COMMUNITY): Payer: Self-pay | Admitting: Orthopedic Surgery

## 2022-12-16 DIAGNOSIS — E876 Hypokalemia: Secondary | ICD-10-CM | POA: Diagnosis not present

## 2022-12-16 DIAGNOSIS — Z96651 Presence of right artificial knee joint: Secondary | ICD-10-CM | POA: Diagnosis not present

## 2022-12-16 NOTE — Addendum Note (Signed)
Addendum  created 12/16/22 1526 by Val Eagle, MD   Clinical Note Signed

## 2022-12-17 DIAGNOSIS — M25561 Pain in right knee: Secondary | ICD-10-CM | POA: Diagnosis not present

## 2022-12-17 DIAGNOSIS — M25661 Stiffness of right knee, not elsewhere classified: Secondary | ICD-10-CM | POA: Diagnosis not present

## 2022-12-17 NOTE — Progress Notes (Signed)
Carelink Summary Report / Loop Recorder 

## 2022-12-18 NOTE — Discharge Summary (Signed)
Patient ID: Doris Ford MRN: [id number] DOB/AGE: Jul 09, 1941 82 y.o.  Admit date: 12/09/2022 Discharge date: 12/10/2022  Admission Diagnoses:  Right knee osteoarthritis  Discharge Diagnoses:  Principal Problem:   S/P total knee arthroplasty, right   Past Medical History:  Diagnosis Date   Arthritis    Breast cancer (HCC) 06/2006   right breast/tx with lumpectomy and tamoxifen   COVID-19    Gastric ulcer    no NSAIDs   Hypertension    under control   Interstitial cystitis    Osteopenia    no change   Stroke [facility])     Surgeries: Procedure(s): TOTAL KNEE ARTHROPLASTY on 12/09/2022   Consultants:   Discharged Condition: Improved  Hospital Course: Doris Ford is an 82 y.o. female who was admitted 12/09/2022 for operative treatment ofS/P total knee arthroplasty, right. Patient has severe unremitting pain that affects sleep, daily activities, and work/hobbies. After pre-op clearance the patient was taken to the operating room on 12/09/2022 and underwent  Procedure(s): TOTAL KNEE ARTHROPLASTY.    Patient was given perioperative antibiotics:  Anti-infectives (From admission, onward)    Start     Dose/Rate Route Frequency Ordered Stop   12/09/22 1600  ceFAZolin (ANCEF) IVPB 2g/100 mL premix        2 g 200 mL/hr over 30 Minutes Intravenous Every 6 hours 12/09/22 1511 12/09/22 2232   12/09/22 0745  ceFAZolin (ANCEF) IVPB 2g/100 mL premix        2 g 200 mL/hr over 30 Minutes Intravenous On call to O.R. 12/09/22 0732 12/09/22 1046        Patient was given sequential compression devices, early ambulation, and chemoprophylaxis to prevent DVT. Patient worked with PT and was meeting their goals regarding safe ambulation and transfers.  Patient benefited maximally from hospital stay and there were no complications.    Recent vital signs: No data found.   Recent laboratory studies: No results for input(s): "WBC", "HGB", "HCT", "PLT", "NA", "K", "CL", "CO2", "BUN",  "CREATININE", "GLUCOSE", "INR", "CALCIUM" in the last 72 hours.  Invalid input(s): "PT", "2"   Discharge Medications:   Allergies as of 12/10/2022       Reactions   Codeine Nausea Only   Severe nausea.  Dizziness.   Macrobid [nitrofurantoin] Other (See Comments)   Skin eruption        Medication List     TAKE these medications    acetaminophen 650 MG CR tablet Commonly known as: TYLENOL Take 650-1,300 mg by mouth 2 (two) times daily as needed for pain.   amLODipine 5 MG tablet Commonly known as: NORVASC Take 5 mg by mouth daily.   atorvastatin 40 MG tablet Commonly known as: LIPITOR Take 1 tablet (40 mg total) by mouth daily.   CALTRATE 600 PLUS-VIT D PO Take 1 tablet by mouth daily.   clopidogrel 75 MG tablet Commonly known as: PLAVIX Take 1 tablet (75 mg total) by mouth daily.   fluticasone 50 MCG/ACT nasal spray Commonly known as: FLONASE Place 2 sprays into both nostrils daily.   hydrochlorothiazide 12.5 MG tablet Commonly known as: HYDRODIURIL Take 12.5 mg by mouth daily.   methocarbamol 500 MG tablet Commonly known as: ROBAXIN Take 1 tablet (500 mg total) by mouth every 6 (six) hours as needed for muscle spasms.   oxyCODONE 5 MG immediate release tablet Commonly known as: Oxy IR/ROXICODONE Take 1 tablet (5 mg total) by mouth every 4 (four) hours as needed for severe pain.   polyethylene glycol 17  g packet Commonly known as: MIRALAX / GLYCOLAX Take 17 g by mouth daily as needed for mild constipation.   potassium chloride SA 20 MEQ tablet Commonly known as: KLOR-CON M Take 1 tablet (20 mEq total) by mouth daily for 14 days.   senna 8.6 MG Tabs tablet Commonly known as: SENOKOT Take 2 tablets (17.2 mg total) by mouth at bedtime for 14 days.   sertraline 50 MG tablet Commonly known as: Zoloft Take 1.5 tablets (75 mg total) by mouth daily.   Systane Hydration PF 0.4-0.3 % Soln Generic drug: Polyethyl Glyc-Propyl Glyc PF Place 1 drop into both  eyes daily as needed (dry eyes).   vitamin C 1000 MG tablet Take 1,000 mg by mouth daily.   Vitamin D3 50 MCG (2000 UT) Tabs Take 2,000 Units by mouth daily.               Discharge Care Instructions  (From admission, onward)           Start     Ordered   12/10/22 0000  Change dressing       Comments: Maintain surgical dressing until follow up in the clinic. If the edges start to pull up, may reinforce with tape. If the dressing is no longer working, may remove and cover with gauze and tape, but must keep the area dry and clean.  Call with any questions or concerns.   12/10/22 0811            Diagnostic Studies: CUP PACEART REMOTE DEVICE CHECK  Result Date: 12/01/2022 ILR summary report received. Battery status OK. Normal device function. No new symptom, tachy, brady, or pause episodes. No new AF episodes. Monthly summary reports and ROV/PRN 4 false AF events, longest duration , EGM's show SR/ST with ectopy and some oversensing of noise LA, CVRS   Disposition: Discharge disposition: 01-Home or Self Care       Discharge Instructions     Call MD / Call 911   Complete by: As directed    If you experience chest pain or shortness of breath, CALL 911 and be transported to the hospital emergency room.  If you develope a fever above 101 F, pus (white drainage) or increased drainage or redness at the wound, or calf pain, call your surgeon's office.   Change dressing   Complete by: As directed    Maintain surgical dressing until follow up in the clinic. If the edges start to pull up, may reinforce with tape. If the dressing is no longer working, may remove and cover with gauze and tape, but must keep the area dry and clean.  Call with any questions or concerns.   Constipation Prevention   Complete by: As directed    Drink plenty of fluids.  Prune juice may be helpful.  You may use a stool softener, such as Colace (over the counter) 100 mg twice a day.  Use MiraLax  (over the counter) for constipation as needed.   Diet - low sodium heart healthy   Complete by: As directed    Increase activity slowly as tolerated   Complete by: As directed    Weight bearing as tolerated with assist device (walker, cane, etc) as directed, use it as long as suggested by your surgeon or therapist, typically at least 4-6 weeks.   Post-operative opioid taper instructions:   Complete by: As directed    POST-OPERATIVE OPIOID TAPER INSTRUCTIONS: It is important to wean off of your opioid medication as soon  as possible. If you do not need pain medication after your surgery it is ok to stop day one. Opioids include: Codeine, Hydrocodone(Norco, Vicodin), Oxycodone(Percocet, oxycontin) and hydromorphone amongst others.  Long term and even short term use of opiods can cause: Increased pain response Dependence Constipation Depression Respiratory depression And more.  Withdrawal symptoms can include Flu like symptoms Nausea, vomiting And more Techniques to manage these symptoms Hydrate well Eat regular healthy meals Stay active Use relaxation techniques(deep breathing, meditating, yoga) Do Not substitute Alcohol to help with tapering If you have been on opioids for less than two weeks and do not have pain than it is ok to stop all together.  Plan to wean off of opioids This plan should start within one week post op of your joint replacement. Maintain the same interval or time between taking each dose and first decrease the dose.  Cut the total daily intake of opioids by one tablet each day Next start to increase the time between doses. The last dose that should be eliminated is the evening dose.      TED hose   Complete by: As directed    Use stockings (TED hose) for 2 weeks on both leg(s).  You may remove them at night for sleeping.        Follow-up Information     Durene Romans, MD. Go on 12/24/2022.   Specialty: Orthopedic Surgery Why: You are scheduled for  first post op appt on Wednesday May 29 at 3:45pm. Contact information: [address] [place] [address] [phone]         Benita Stabile, MD. Schedule an appointment as soon as possible for a visit in 1 week(s).   Specialty: Internal Medicine Why: Hypokalemia (2.8 pre-op) Contact information: [address] [place] [address] [phone]                  Signed: Cassandria Anger 12/18/2022, 9:42 AM

## 2022-12-19 DIAGNOSIS — M25661 Stiffness of right knee, not elsewhere classified: Secondary | ICD-10-CM | POA: Diagnosis not present

## 2022-12-19 DIAGNOSIS — M25561 Pain in right knee: Secondary | ICD-10-CM | POA: Diagnosis not present

## 2022-12-24 DIAGNOSIS — M25561 Pain in right knee: Secondary | ICD-10-CM | POA: Diagnosis not present

## 2022-12-24 DIAGNOSIS — M25661 Stiffness of right knee, not elsewhere classified: Secondary | ICD-10-CM | POA: Diagnosis not present

## 2022-12-26 DIAGNOSIS — M25561 Pain in right knee: Secondary | ICD-10-CM | POA: Diagnosis not present

## 2022-12-26 DIAGNOSIS — M25661 Stiffness of right knee, not elsewhere classified: Secondary | ICD-10-CM | POA: Diagnosis not present

## 2022-12-29 ENCOUNTER — Ambulatory Visit (INDEPENDENT_AMBULATORY_CARE_PROVIDER_SITE_OTHER): Payer: Medicare HMO

## 2022-12-29 DIAGNOSIS — M25561 Pain in right knee: Secondary | ICD-10-CM | POA: Diagnosis not present

## 2022-12-29 DIAGNOSIS — I639 Cerebral infarction, unspecified: Secondary | ICD-10-CM

## 2022-12-29 DIAGNOSIS — M25661 Stiffness of right knee, not elsewhere classified: Secondary | ICD-10-CM | POA: Diagnosis not present

## 2022-12-29 LAB — CUP PACEART REMOTE DEVICE CHECK
Date Time Interrogation Session: 20240602230242
Implantable Pulse Generator Implant Date: 20210609

## 2022-12-31 DIAGNOSIS — M25661 Stiffness of right knee, not elsewhere classified: Secondary | ICD-10-CM | POA: Diagnosis not present

## 2022-12-31 DIAGNOSIS — M25561 Pain in right knee: Secondary | ICD-10-CM | POA: Diagnosis not present

## 2023-01-02 DIAGNOSIS — M25561 Pain in right knee: Secondary | ICD-10-CM | POA: Diagnosis not present

## 2023-01-02 DIAGNOSIS — M25661 Stiffness of right knee, not elsewhere classified: Secondary | ICD-10-CM | POA: Diagnosis not present

## 2023-01-05 DIAGNOSIS — M25661 Stiffness of right knee, not elsewhere classified: Secondary | ICD-10-CM | POA: Diagnosis not present

## 2023-01-05 DIAGNOSIS — M25561 Pain in right knee: Secondary | ICD-10-CM | POA: Diagnosis not present

## 2023-01-07 DIAGNOSIS — M25561 Pain in right knee: Secondary | ICD-10-CM | POA: Diagnosis not present

## 2023-01-07 DIAGNOSIS — M25661 Stiffness of right knee, not elsewhere classified: Secondary | ICD-10-CM | POA: Diagnosis not present

## 2023-01-09 DIAGNOSIS — M25561 Pain in right knee: Secondary | ICD-10-CM | POA: Diagnosis not present

## 2023-01-09 DIAGNOSIS — M25661 Stiffness of right knee, not elsewhere classified: Secondary | ICD-10-CM | POA: Diagnosis not present

## 2023-01-12 DIAGNOSIS — M25561 Pain in right knee: Secondary | ICD-10-CM | POA: Diagnosis not present

## 2023-01-12 DIAGNOSIS — M25661 Stiffness of right knee, not elsewhere classified: Secondary | ICD-10-CM | POA: Diagnosis not present

## 2023-01-14 DIAGNOSIS — Z5189 Encounter for other specified aftercare: Secondary | ICD-10-CM | POA: Diagnosis not present

## 2023-01-15 DIAGNOSIS — M25661 Stiffness of right knee, not elsewhere classified: Secondary | ICD-10-CM | POA: Diagnosis not present

## 2023-01-15 DIAGNOSIS — I10 Essential (primary) hypertension: Secondary | ICD-10-CM | POA: Diagnosis not present

## 2023-01-15 DIAGNOSIS — M25561 Pain in right knee: Secondary | ICD-10-CM | POA: Diagnosis not present

## 2023-01-15 DIAGNOSIS — R7301 Impaired fasting glucose: Secondary | ICD-10-CM | POA: Diagnosis not present

## 2023-01-19 DIAGNOSIS — M25661 Stiffness of right knee, not elsewhere classified: Secondary | ICD-10-CM | POA: Diagnosis not present

## 2023-01-19 DIAGNOSIS — M25561 Pain in right knee: Secondary | ICD-10-CM | POA: Diagnosis not present

## 2023-01-20 NOTE — Progress Notes (Signed)
Carelink Summary Report / Loop Recorder 

## 2023-01-21 DIAGNOSIS — Z8673 Personal history of transient ischemic attack (TIA), and cerebral infarction without residual deficits: Secondary | ICD-10-CM | POA: Diagnosis not present

## 2023-01-21 DIAGNOSIS — Z1331 Encounter for screening for depression: Secondary | ICD-10-CM | POA: Diagnosis not present

## 2023-01-21 DIAGNOSIS — R008 Other abnormalities of heart beat: Secondary | ICD-10-CM | POA: Diagnosis not present

## 2023-01-21 DIAGNOSIS — G4730 Sleep apnea, unspecified: Secondary | ICD-10-CM | POA: Diagnosis not present

## 2023-01-21 DIAGNOSIS — M545 Low back pain, unspecified: Secondary | ICD-10-CM | POA: Diagnosis not present

## 2023-01-21 DIAGNOSIS — F419 Anxiety disorder, unspecified: Secondary | ICD-10-CM | POA: Diagnosis not present

## 2023-01-21 DIAGNOSIS — I10 Essential (primary) hypertension: Secondary | ICD-10-CM | POA: Diagnosis not present

## 2023-01-21 DIAGNOSIS — M84374D Stress fracture, right foot, subsequent encounter for fracture with routine healing: Secondary | ICD-10-CM | POA: Diagnosis not present

## 2023-01-21 DIAGNOSIS — N3281 Overactive bladder: Secondary | ICD-10-CM | POA: Diagnosis not present

## 2023-01-21 DIAGNOSIS — Z0001 Encounter for general adult medical examination with abnormal findings: Secondary | ICD-10-CM | POA: Diagnosis not present

## 2023-01-22 DIAGNOSIS — M25561 Pain in right knee: Secondary | ICD-10-CM | POA: Diagnosis not present

## 2023-01-22 DIAGNOSIS — M25661 Stiffness of right knee, not elsewhere classified: Secondary | ICD-10-CM | POA: Diagnosis not present

## 2023-01-26 DIAGNOSIS — M25561 Pain in right knee: Secondary | ICD-10-CM | POA: Diagnosis not present

## 2023-01-26 DIAGNOSIS — M25661 Stiffness of right knee, not elsewhere classified: Secondary | ICD-10-CM | POA: Diagnosis not present

## 2023-01-28 DIAGNOSIS — M25561 Pain in right knee: Secondary | ICD-10-CM | POA: Diagnosis not present

## 2023-01-28 DIAGNOSIS — M25661 Stiffness of right knee, not elsewhere classified: Secondary | ICD-10-CM | POA: Diagnosis not present

## 2023-02-02 ENCOUNTER — Ambulatory Visit (INDEPENDENT_AMBULATORY_CARE_PROVIDER_SITE_OTHER): Payer: Medicare HMO

## 2023-02-02 DIAGNOSIS — M25661 Stiffness of right knee, not elsewhere classified: Secondary | ICD-10-CM | POA: Diagnosis not present

## 2023-02-02 DIAGNOSIS — I639 Cerebral infarction, unspecified: Secondary | ICD-10-CM

## 2023-02-02 DIAGNOSIS — M25561 Pain in right knee: Secondary | ICD-10-CM | POA: Diagnosis not present

## 2023-02-02 LAB — CUP PACEART REMOTE DEVICE CHECK
Date Time Interrogation Session: 20240705230511
Implantable Pulse Generator Implant Date: 20210609

## 2023-02-04 DIAGNOSIS — M25661 Stiffness of right knee, not elsewhere classified: Secondary | ICD-10-CM | POA: Diagnosis not present

## 2023-02-04 DIAGNOSIS — M25561 Pain in right knee: Secondary | ICD-10-CM | POA: Diagnosis not present

## 2023-02-09 DIAGNOSIS — M25661 Stiffness of right knee, not elsewhere classified: Secondary | ICD-10-CM | POA: Diagnosis not present

## 2023-02-09 DIAGNOSIS — M25561 Pain in right knee: Secondary | ICD-10-CM | POA: Diagnosis not present

## 2023-02-12 DIAGNOSIS — M25561 Pain in right knee: Secondary | ICD-10-CM | POA: Diagnosis not present

## 2023-02-12 DIAGNOSIS — M25661 Stiffness of right knee, not elsewhere classified: Secondary | ICD-10-CM | POA: Diagnosis not present

## 2023-02-16 DIAGNOSIS — M25561 Pain in right knee: Secondary | ICD-10-CM | POA: Diagnosis not present

## 2023-02-16 DIAGNOSIS — M25661 Stiffness of right knee, not elsewhere classified: Secondary | ICD-10-CM | POA: Diagnosis not present

## 2023-02-18 DIAGNOSIS — M25561 Pain in right knee: Secondary | ICD-10-CM | POA: Diagnosis not present

## 2023-02-18 DIAGNOSIS — M25661 Stiffness of right knee, not elsewhere classified: Secondary | ICD-10-CM | POA: Diagnosis not present

## 2023-02-19 NOTE — Progress Notes (Signed)
Carelink Summary Report / Loop Recorder 

## 2023-02-25 DIAGNOSIS — M1712 Unilateral primary osteoarthritis, left knee: Secondary | ICD-10-CM | POA: Diagnosis not present

## 2023-03-09 ENCOUNTER — Ambulatory Visit: Payer: Medicare HMO

## 2023-03-09 DIAGNOSIS — I639 Cerebral infarction, unspecified: Secondary | ICD-10-CM

## 2023-03-18 DIAGNOSIS — R55 Syncope and collapse: Secondary | ICD-10-CM | POA: Diagnosis not present

## 2023-03-18 DIAGNOSIS — R195 Other fecal abnormalities: Secondary | ICD-10-CM | POA: Diagnosis not present

## 2023-03-19 ENCOUNTER — Telehealth: Payer: Self-pay

## 2023-03-19 NOTE — Telephone Encounter (Signed)
   Pre-operative Risk Assessment    Patient Name: Doris Ford  DOB: 06/08/1941 MRN: [id number]    Pts last OV was 03/25/21. Pt does not have a follow up appt scheduled.   Request for Surgical Clearance    Procedure:   Left Total Knee Arthroplasty  Date of Surgery:  Clearance 05/26/23                                 Surgeon:  Dr. Durene Romans Surgeon's Group or Practice Name:  [facility] Phone number:  [phone] Rosalva Ferron Fax number:  [phone]   Type of Clearance Requested:   - Medical  - Pharmacy:  Hold Clopidogrel (Plavix) pt will need instructions on when/if to hold   Type of Anesthesia:  Spinal   Additional requests/questions:    Signed, Zada Finders   03/19/2023, 1:15 PM

## 2023-03-19 NOTE — Telephone Encounter (Signed)
   Name: Doris Ford  DOB: 1941/04/20  MRN: [id number]  Primary Cardiologist: None  Chart reviewed as part of pre-operative protocol coverage. Because of Thiana Anis Yusuf's past medical history and time since last visit, she will require a follow-up in-office visit in order to better assess preoperative cardiovascular risk.  Patient will need reestablishment of care appointment as a new patient.  She was last seen in 02/2021    Napoleon Form, Leodis Rains, NP  03/19/2023, 1:20 PM

## 2023-03-19 NOTE — Telephone Encounter (Signed)
Contacted the patient and scheduler her a follow up appt with Francis Dowse on April 21, 2023 at 1:55pm patient agreeable and voiced understanding.

## 2023-03-22 NOTE — Progress Notes (Unsigned)
GI Office Note    Referring Provider: Benita Stabile, MD Primary Care Physician:  Benita Stabile, MD  Primary Gastroenterologist:  Chief Complaint   No chief complaint on file.    History of Present Illness   Doris Ford is a 82 y.o. female presenting today at the request of Leone Payor, NP for further evaluation of GI bleed.   Seen by PCP last week with c/o blood in stool, abdominal cramping and lightheadedness.    Labs 03/18/23: Creatinine 0.67, BUN 22, Tbili 0.4, AP 75, AST 26, ALT 15, Hgb 14.5, Hct 42.6, Platelets 239   EGD 09/2019: -non-obstructing Schatzki ring -previously noted gastric ulcer healed  Colonoscopy 11/2010:???   Medications   Current Outpatient Medications  Medication Sig Dispense Refill   acetaminophen (TYLENOL) 650 MG CR tablet Take 650-1,300 mg by mouth 2 (two) times daily as needed for pain.     amLODipine (NORVASC) 5 MG tablet Take 5 mg by mouth daily.     Ascorbic Acid (VITAMIN C) 1000 MG tablet Take 1,000 mg by mouth daily.     atorvastatin (LIPITOR) 40 MG tablet Take 1 tablet (40 mg total) by mouth daily. 30 tablet 1   Calcium-Vitamin D (CALTRATE 600 PLUS-VIT D PO) Take 1 tablet by mouth daily.      Cholecalciferol (VITAMIN D3) 50 MCG (2000 UT) TABS Take 2,000 Units by mouth daily.     clopidogrel (PLAVIX) 75 MG tablet Take 1 tablet (75 mg total) by mouth daily. 30 tablet 1   fluticasone (FLONASE) 50 MCG/ACT nasal spray Place 2 sprays into both nostrils daily. 16 g 12   hydrochlorothiazide (HYDRODIURIL) 12.5 MG tablet Take 12.5 mg by mouth daily.     methocarbamol (ROBAXIN) 500 MG tablet Take 1 tablet (500 mg total) by mouth every 6 (six) hours as needed for muscle spasms. 40 tablet 2   oxyCODONE (OXY IR/ROXICODONE) 5 MG immediate release tablet Take 1 tablet (5 mg total) by mouth every 4 (four) hours as needed for severe pain. 42 tablet 0   Polyethyl Glyc-Propyl Glyc PF (SYSTANE HYDRATION PF) 0.4-0.3 % SOLN Place 1 drop into both eyes  daily as needed (dry eyes).     polyethylene glycol (MIRALAX / GLYCOLAX) 17 g packet Take 17 g by mouth daily as needed for mild constipation. 14 each 0   potassium chloride SA (KLOR-CON M) 20 MEQ tablet Take 1 tablet (20 mEq total) by mouth daily for 14 days. 14 tablet 0   sertraline (ZOLOFT) 50 MG tablet Take 1.5 tablets (75 mg total) by mouth daily. 45 tablet 5   No current facility-administered medications for this visit.    Allergies   Allergies as of 03/23/2023 - Review Complete 12/09/2022  Allergen Reaction Noted   Codeine Nausea Only 01/10/2014   Macrobid [nitrofurantoin] Other (See Comments) 10/10/2019    Past Medical History   Past Medical History:  Diagnosis Date   Arthritis    Breast cancer (HCC) 06/2006   right breast/tx with lumpectomy and tamoxifen   COVID-19    Gastric ulcer    no NSAIDs   Hypertension    under control   Interstitial cystitis    Osteopenia    no change   Stroke [facility])     Past Surgical History   Past Surgical History:  Procedure Laterality Date   BILATERAL SALPINGOOPHORECTOMY  age 16s   BIOPSY  05/31/2019   Procedure: BIOPSY;  Surgeon: Corbin Ade, MD;  Location: [facility]  ENDO SUITE;  Service: Endoscopy;;   BREAST SURGERY Right    lumpectomy-multiple times   BUNIONECTOMY WITH HAMMERTOE RECONSTRUCTION Bilateral ~2000   CATARACT EXTRACTION Bilateral 2007   ESOPHAGOGASTRODUODENOSCOPY N/A 05/31/2019   Dr. Jena Gauss: widely patent Schatzki ring, non-bleeding cratered ulcer with adherent clot injected/thermally sealed. motled gastric mucosa with benign biopsy negative for H.pylori. plans for 3 month surveillance EGD.   ESOPHAGOGASTRODUODENOSCOPY N/A 10/05/2019   Procedure: ESOPHAGOGASTRODUODENOSCOPY (EGD);  Surgeon: Corbin Ade, MD;  Location: [facility] ENDO SUITE;  Service: Endoscopy;  Laterality: N/A;  2:00pm - moved up per office   KNEE SURGERY Bilateral as teen   LOOP RECORDER INSERTION N/A 01/04/2020   Procedure: LOOP RECORDER INSERTION;  Surgeon:  Regan Lemming, MD;  Location: [facility] INVASIVE CV LAB;  Service: Cardiovascular;  Laterality: N/A;   TONSILLECTOMY AND ADENOIDECTOMY  age 3   TOTAL HIP ARTHROPLASTY Right 12/16/2013   Procedure: RIGHT TOTAL HIP ARTHROPLASTY ANTERIOR APPROACH;  Surgeon: Kathryne Hitch, MD;  Location: [facility] ORS;  Service: Orthopedics;  Laterality: Right;   TOTAL KNEE ARTHROPLASTY Right 12/09/2022   Procedure: TOTAL KNEE ARTHROPLASTY;  Surgeon: Durene Romans, MD;  Location: [facility] ORS;  Service: Orthopedics;  Laterality: Right;   TOTAL VAGINAL HYSTERECTOMY  age 41    Past Family History   Family History  Problem Relation Age of Onset   Cancer Mother        breast/mastectomy   Heart disease Mother        poor circulation   Deep vein thrombosis Mother    Osteoporosis Mother    Hypertension Father    Colon cancer Neg Hx    Sleep apnea Neg Hx     Past Social History   Social History   Socioeconomic History   Marital status: Married    Spouse name: Not on file   Number of children: Not on file   Years of education: Not on file   Highest education level: Not on file  Occupational History   Not on file  Tobacco Use   Smoking status: Never   Smokeless tobacco: Never  Vaping Use   Vaping status: Never Used  Substance and Sexual Activity   Alcohol use: No   Drug use: No   Sexual activity: Not Currently    Partners: Male    Birth control/protection: Surgical    Comment: [facility]  Other Topics Concern   Not on file  Social History Narrative   Not on file   Social Determinants of Health   Financial Resource Strain: Not on file  Food Insecurity: No Food Insecurity (12/09/2022)   Hunger Vital Sign    Worried About Running Out of Food in the Last Year: Never true    Ran Out of Food in the Last Year: Never true  Transportation Needs: No Transportation Needs (12/09/2022)   PRAPARE - Administrator, Civil Service (Medical): No    Lack of Transportation (Non-Medical): No  Physical  Activity: Not on file  Stress: Not on file  Social Connections: Not on file  Intimate Partner Violence: Not At Risk (12/09/2022)   Humiliation, Afraid, Rape, and Kick questionnaire    Fear of Current or Ex-Partner: No    Emotionally Abused: No    Physically Abused: No    Sexually Abused: No    Review of Systems   General: Negative for anorexia, weight loss, fever, chills, fatigue, weakness. Eyes: Negative for vision changes.  ENT: Negative for hoarseness, difficulty swallowing , nasal congestion. CV:  Negative for chest pain, angina, palpitations, dyspnea on exertion, peripheral edema.  Respiratory: Negative for dyspnea at rest, dyspnea on exertion, cough, sputum, wheezing.  GI: See history of present illness. GU:  Negative for dysuria, hematuria, urinary incontinence, urinary frequency, nocturnal urination.  MS: Negative for joint pain, low back pain.  Derm: Negative for rash or itching.  Neuro: Negative for weakness, abnormal sensation, seizure, frequent headaches, memory loss,  confusion.  Psych: Negative for anxiety, depression, suicidal ideation, hallucinations.  Endo: Negative for unusual weight change.  Heme: Negative for bruising or bleeding. Allergy: Negative for rash or hives.  Physical Exam   LMP  (LMP Unknown)    General: Well-nourished, well-developed in no acute distress.  Head: Normocephalic, atraumatic.   Eyes: Conjunctiva pink, no icterus. Mouth: Oropharyngeal mucosa moist and pink , no lesions erythema or exudate. Neck: Supple without thyromegaly, masses, or lymphadenopathy.  Lungs: Clear to auscultation bilaterally.  Heart: Regular rate and rhythm, no murmurs rubs or gallops.  Abdomen: Bowel sounds are normal, nontender, nondistended, no hepatosplenomegaly or masses,  no abdominal bruits or hernia, no rebound or guarding.   Rectal: *** Extremities: No lower extremity edema. No clubbing or deformities.  Neuro: Alert and oriented x 4 , grossly normal  neurologically.  Skin: Warm and dry, no rash or jaundice.   Psych: Alert and cooperative, normal mood and affect.  Labs   *** Imaging Studies   CUP PACEART REMOTE DEVICE CHECK  Result Date: 03/06/2023 ILR summary report received. Battery status OK. Normal device function. No new symptom, tachy, brady, or pause episodes. No new AF episodes. Monthly summary reports and ROV/PRN - CS, CVRS   Assessment       PLAN   ***   Leanna Battles. Melvyn Neth, MHS, PA-C [facility]

## 2023-03-23 ENCOUNTER — Encounter: Payer: Self-pay | Admitting: Gastroenterology

## 2023-03-23 ENCOUNTER — Ambulatory Visit: Payer: Medicare HMO | Admitting: Gastroenterology

## 2023-03-23 VITALS — BP 120/71 | HR 84 | Temp 97.3°F | Ht 64.0 in | Wt 109.4 lb

## 2023-03-23 DIAGNOSIS — K625 Hemorrhage of anus and rectum: Secondary | ICD-10-CM

## 2023-03-23 NOTE — Patient Instructions (Signed)
Colonoscopy to be scheduled. If you have any recurrent bleeding or questions/concerns please call my CMA Tammy at [phone].

## 2023-03-24 NOTE — Progress Notes (Signed)
Carelink Summary Report / Loop Recorder 

## 2023-03-26 ENCOUNTER — Ambulatory Visit: Payer: Medicare HMO | Admitting: Gastroenterology

## 2023-04-10 ENCOUNTER — Telehealth: Payer: Self-pay | Admitting: *Deleted

## 2023-04-10 MED ORDER — CLENPIQ 10-3.5-12 MG-GM -GM/175ML PO SOLN: 1.0000 | ORAL | 0 refills | Status: DC

## 2023-04-10 NOTE — Telephone Encounter (Signed)
Called pt and she has been scheduled for TCS with Dr. Jena Gauss ASA 3 10/9. Aware will send instructions to her mychart. Will call back with pre-op appt. Will send rx for prep to [facility]. Requested short volume

## 2023-04-12 LAB — CUP PACEART REMOTE DEVICE CHECK
Date Time Interrogation Session: 20240911031356
Implantable Pulse Generator Implant Date: 20210609

## 2023-04-13 ENCOUNTER — Encounter: Payer: Self-pay | Admitting: *Deleted

## 2023-04-13 ENCOUNTER — Ambulatory Visit: Payer: Medicare HMO

## 2023-04-13 DIAGNOSIS — I639 Cerebral infarction, unspecified: Secondary | ICD-10-CM | POA: Diagnosis not present

## 2023-04-21 ENCOUNTER — Ambulatory Visit: Payer: Medicare HMO | Attending: Physician Assistant | Admitting: Physician Assistant

## 2023-04-21 ENCOUNTER — Encounter: Payer: Self-pay | Admitting: Physician Assistant

## 2023-04-21 VITALS — BP 130/62 | HR 74 | Ht 64.5 in | Wt 109.0 lb

## 2023-04-21 DIAGNOSIS — I4510 Unspecified right bundle-branch block: Secondary | ICD-10-CM | POA: Diagnosis not present

## 2023-04-21 DIAGNOSIS — I10 Essential (primary) hypertension: Secondary | ICD-10-CM | POA: Diagnosis not present

## 2023-04-21 DIAGNOSIS — Z4509 Encounter for adjustment and management of other cardiac device: Secondary | ICD-10-CM | POA: Diagnosis not present

## 2023-04-21 DIAGNOSIS — Z01818 Encounter for other preprocedural examination: Secondary | ICD-10-CM | POA: Diagnosis not present

## 2023-04-21 NOTE — Patient Instructions (Signed)
Medication Instructions:   Your physician recommends that you continue on your current medications as directed. Please refer to the Current Medication list given to you today.  *If you need a refill on your cardiac medications before your next appointment, please call your pharmacy*   Lab Work: NONE ORDERED  TODAY    If you have labs (blood work) drawn today and your tests are completely normal, you will receive your results only by: MyChart Message (if you have MyChart) OR A paper copy in the mail If you have any lab test that is abnormal or we need to change your treatment, we will call you to review the results.   Testing/Procedures: NONE ORDERED  TODAY    Follow-Up: At [facility], you and your health needs are our priority.  As part of our continuing mission to provide you with exceptional heart care, we have created designated Provider Care Teams.  These Care Teams include your primary Cardiologist (physician) and Advanced Practice Providers (APPs -  Physician Assistants and Nurse Practitioners) who all work together to provide you with the care you need, when you need it.  We recommend signing up for the patient portal called "MyChart".  Sign up information is provided on this After Visit Summary.  MyChart is used to connect with patients for Virtual Visits (Telemedicine).  Patients are able to view lab/test results, encounter notes, upcoming appointments, etc.  Non-urgent messages can be sent to your provider as well.   To learn more about what you can do with MyChart, go to [email or web address].    Your next appointment:  CONTACT [facility]/ EP DEPARTMENT  [phone] AS NEEDED FOR  ANY CARDIAC RELATED SYMPTOMS     Other Instructions

## 2023-04-21 NOTE — Progress Notes (Signed)
Cardiology Office Note:  .   Date:  04/21/2023  ID:  Doris Ford, DOB October 23, 1940, MRN [id number] PCP: Benita Stabile, MD  [facility] Providers EP: Dr. Elberta Fortis  History of Present Illness: Doris Ford is a 82 y.o. female w/PMHx of HTN, GIBs, Stroke > loop  R knee surgery May 2024 without reported complications  Saw GI 03/23/23 for abd pain/cramping associated with lightheadedness, (called 911 VSS not transported), blood in stool > recurrent bleeding the next day, planned for colonoscopy  Pre-op evaluation requested for Left Total Knee Arthroplasty acheduled for 05/26/23, need for hold of her plavix (spinal anesthesia planned)  Today's visit is scheduled as an annual visit and pre-op evaluation RCRI score is one (0.9%)  ROS:   She is accompanied by her daughter She is active despite her knee, regularly participates in yoga (stretch and restore class) Cares for her home, laundry etc. Her daughter mentions that after her other knee surgery in May, she participated in PT that was pretty rigorous and did very well, pt agrees, and without symptoms  The day she felt weak/maybe near syncopal she was in yoga, had her head down on her (seated on a chair) arms on the back of the chair, started to have some belly cramping and urge to have BM, got a little lightheaded and weak. Class mate/instructor reported when she lifted her head up she was pale, she felt clammy and they helped her to the floor Nothing like that prior or since She went home and have an episode of significant diarrhea.  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 Left ventricular ejection fraction, by estimation, is 60 to 65%. The  left ventricle has normal function. The left ventricle has no regional  wall motion abnormalities. There is mild left ventricular hypertrophy.  Left ventricular diastolic parameters  are consistent with Grade I diastolic dysfunction (impaired relaxation).   2. Right ventricular systolic function is normal. The right ventricular  size is normal. There is normal pulmonary artery systolic pressure.   3. The mitral valve is grossly normal. Trivial mitral valve  regurgitation.   4. The aortic valve is tricuspid. Aortic valve regurgitation is not  visualized.   5. The inferior vena cava is normal in size with greater than 50%  respiratory variability, suggesting right atrial pressure of 3 mmHg.   6. Trivial to small pericardial effusion. No tamponade features.    Risk Assessment/Calculations:    Physical Exam:   VS:  LMP  (LMP Unknown)  Wt Readings from Last 3 Encounters:  03/23/23 109 lb 6.4 oz (49.6 kg)  12/09/22 116 lb (52.6 kg)  11/26/22 108 lb (49 kg)    GEN: Well nourished, well developed in no acute distress NECK: No JVD; No carotid bruits CARDIAC: RRR, no murmurs, rubs, gallops RESPIRATORY:  CTA b/l without rales, wheezing or rhonchi  ABDOMEN: Soft, non-tender, non-distended EXTREMITIES:  No edema; No deformity   PPM/ICD/ILR site:  is stable, no  thinning, fluctuation, tethering  ASSESSMENT AND PLAN: .    Cryptogenic stroke Loop recorder No true AFib to date  HTN Looks good  Pre-op evaluation Low cardiac risk surgery Low cardiac risk score Despite her knee she remains active No difficulties with her ADLs No symptoms to suggest change clinical change from her last echo No new or additional cardia evaluation prior to knee surgery or colonoscopy No cardiac contraindication to colonoscopy/knee surgery  She is not on Plavix for cardiac reason, defer to her PMD recommendations for interruption of this   Given time since her last MD visit (seems time of loop implant) reviewed with DOD, was in agreement     Dispo: remotes as usual, EP in clinic again PRN  Signed, Alecsander Hattabaugh Norberto Sorenson, Cordelia Poche ]

## 2023-04-29 NOTE — Progress Notes (Signed)
Carelink Summary Report / Loop Recorder 

## 2023-05-01 NOTE — Patient Instructions (Signed)
Doris Ford  05/01/2023     @PREFPERIOPPHARMACY @   Your procedure is scheduled on  05/06/2023.   Report to [facility] at  0730  A.M.   Call this number if you have problems the morning of surgery:  [phone]  If you experience any cold or flu symptoms such as cough, fever, chills, shortness of breath, etc. between now and your scheduled surgery, please notify us at the above number.   Remember:  Follow the diet and prep instructions given to you by the office.     Take these medicines the morning of surgery with A SIP OF WATER                                         amlodipine, zoloft.     Do not wear jewelry, make-up or nail polish, including gel polish,  artificial nails, or any other type of covering on natural nails (fingers and  toes).  Do not wear lotions, powders, or perfumes, or deodorant.  Do not shave 48 hours prior to surgery.  Men may shave face and neck.  Do not bring valuables to the hospital.  [facility] is not responsible for any belongings or valuables.  Contacts, dentures or bridgework may not be worn into surgery.  Leave your suitcase in the car.  After surgery it may be brought to your room.  For patients admitted to the hospital, discharge time will be determined by your treatment team.  Patients discharged the day of surgery will not be allowed to drive home and must have someone with them for 24 hours.    Special instructions:   DO NOT smoke tobacco or vape for 24 hours before your procedure.  Please read over the following fact sheets that you were given. Anesthesia Post-op Instructions and Care and Recovery After Surgery      Colonoscopy, Adult, Care After The following information offers guidance on how to care for yourself after your procedure. Your health care provider may also give you more specific instructions. If you have problems or questions, contact your health care provider. What can I expect after the  procedure? After the procedure, it is common to have: A small amount of blood in your stool for 24 hours after the procedure. Some gas. Mild cramping or bloating of your abdomen. Follow these instructions at home: Eating and drinking  Drink enough fluid to keep your urine pale yellow. Follow instructions from your health care provider about eating or drinking restrictions. Resume your normal diet as told by your health care provider. Avoid heavy or fried foods that are hard to digest. Activity Rest as told by your health care provider. Avoid sitting for a long time without moving. Get up to take short walks every 1-2 hours. This is important to improve blood flow and breathing. Ask for help if you feel weak or unsteady. Return to your normal activities as told by your health care provider. Ask your health care provider what activities are safe for you. Managing cramping and bloating  Try walking around when you have cramps or feel bloated. If directed, apply heat to your abdomen as told by your health care provider. Use the heat source that your health care provider recommends, such as a moist heat pack or a heating pad. Place a towel between your skin and the heat source.  Leave the heat on for 20-30 minutes. Remove the heat if your skin turns bright red. This is especially important if you are unable to feel pain, heat, or cold. You have a greater risk of getting burned. General instructions If you were given a sedative during the procedure, it can affect you for several hours. Do not drive or operate machinery until your health care provider says that it is safe. For the first 24 hours after the procedure: Do not sign important documents. Do not drink alcohol. Do your regular daily activities at a slower pace than normal. Eat soft foods that are easy to digest. Take over-the-counter and prescription medicines only as told by your health care provider. Keep all follow-up visits. This  is important. Contact a health care provider if: You have blood in your stool 2-3 days after the procedure. Get help right away if: You have more than a small spotting of blood in your stool. You have large blood clots in your stool. You have swelling of your abdomen. You have nausea or vomiting. You have a fever. You have increasing pain in your abdomen that is not relieved with medicine. These symptoms may be an emergency. Get help right away. Call 911. Do not wait to see if the symptoms will go away. Do not drive yourself to the hospital. Summary After the procedure, it is common to have a small amount of blood in your stool. You may also have mild cramping and bloating of your abdomen. If you were given a sedative during the procedure, it can affect you for several hours. Do not drive or operate machinery until your health care provider says that it is safe. Get help right away if you have a lot of blood in your stool, nausea or vomiting, a fever, or increased pain in your abdomen. This information is not intended to replace advice given to you by your health care provider. Make sure you discuss any questions you have with your health care provider. Document Revised: 08/26/2022 Document Reviewed: 03/06/2021 Elsevier Patient Education  2024 [employer] Monitored Anesthesia Care, Care After The following information offers guidance on how to care for yourself after your procedure. Your health care provider may also give you more specific instructions. If you have problems or questions, contact your health care provider. What can I expect after the procedure? After the procedure, it is common to have: Tiredness. Little or no memory about what happened during or after the procedure. Impaired judgment when it comes to making decisions. Nausea or vomiting. Some trouble with balance. Follow these instructions at home: For the time period you were told by your health care  provider:  Rest. Do not participate in activities where you could fall or become injured. Do not drive or use machinery. Do not drink alcohol. Do not take sleeping pills or medicines that cause drowsiness. Do not make important decisions or sign legal documents. Do not take care of children on your own. Medicines Take over-the-counter and prescription medicines only as told by your health care provider. If you were prescribed antibiotics, take them as told by your health care provider. Do not stop using the antibiotic even if you start to feel better. Eating and drinking Follow instructions from your health care provider about what you may eat and drink. Drink enough fluid to keep your urine pale yellow. If you vomit: Drink clear fluids slowly and in small amounts as you are able. Clear fluids include water, ice chips, low-calorie sports  drinks, and fruit juice that has water added to it (diluted fruit juice). Eat light and bland foods in small amounts as you are able. These foods include bananas, applesauce, rice, lean meats, toast, and crackers. General instructions  Have a responsible adult stay with you for the time you are told. It is important to have someone help care for you until you are awake and alert. If you have sleep apnea, surgery and some medicines can increase your risk for breathing problems. Follow instructions from your health care provider about wearing your sleep device: When you are sleeping. This includes during daytime naps. While taking prescription pain medicines, sleeping medicines, or medicines that make you drowsy. Do not use any products that contain nicotine or tobacco. These products include cigarettes, chewing tobacco, and vaping devices, such as e-cigarettes. If you need help quitting, ask your health care provider. Contact a health care provider if: You feel nauseous or vomit every time you eat or drink. You feel light-headed. You are still sleepy or  having trouble with balance after 24 hours. You get a rash. You have a fever. You have redness or swelling around the IV site. Get help right away if: You have trouble breathing. You have new confusion after you get home. These symptoms may be an emergency. Get help right away. Call 911. Do not wait to see if the symptoms will go away. Do not drive yourself to the hospital. This information is not intended to replace advice given to you by your health care provider. Make sure you discuss any questions you have with your health care provider. Document Revised: 12/09/2021 Document Reviewed: 12/09/2021 Elsevier Patient Education  2024 [employer].

## 2023-05-04 ENCOUNTER — Encounter (HOSPITAL_COMMUNITY): Payer: Self-pay

## 2023-05-04 ENCOUNTER — Encounter (HOSPITAL_COMMUNITY)
Admission: RE | Admit: 2023-05-04 | Discharge: 2023-05-04 | Disposition: A | Discharge status: Home / Self Care | Payer: Medicare HMO | Source: Ambulatory Visit | Attending: Internal Medicine | Admitting: Internal Medicine

## 2023-05-04 VITALS — BP 130/62 | HR 74 | Temp 97.8°F | Resp 18 | Ht 64.5 in | Wt 109.0 lb

## 2023-05-04 DIAGNOSIS — E876 Hypokalemia: Secondary | ICD-10-CM | POA: Insufficient documentation

## 2023-05-04 DIAGNOSIS — D649 Anemia, unspecified: Secondary | ICD-10-CM | POA: Diagnosis not present

## 2023-05-04 DIAGNOSIS — Z01812 Encounter for preprocedural laboratory examination: Secondary | ICD-10-CM | POA: Insufficient documentation

## 2023-05-04 LAB — BASIC METABOLIC PANEL
Anion gap: 9 (ref 5–15)
BUN: 22 mg/dL (ref 8–23)
CO2: 29 mmol/L (ref 22–32)
Calcium: 9.2 mg/dL (ref 8.9–10.3)
Chloride: 98 mmol/L (ref 98–111)
Creatinine, Ser: 0.55 mg/dL (ref 0.44–1.00)
GFR, Estimated: >60 mL/min (ref >60)
Glucose, Bld: 102 mg/dL — ABNORMAL HIGH (ref 70–99)
Potassium: 3.3 mmol/L — ABNORMAL LOW (ref 3.5–5.1)
Sodium: 136 mmol/L (ref 135–145)

## 2023-05-04 LAB — CBC WITH DIFFERENTIAL/PLATELET
Abs Immature Granulocytes: 0.01 10*3/uL (ref 0.00–0.07)
Basophils Absolute: 0 10*3/uL (ref 0.0–0.1)
Basophils Relative: 1 %
Eosinophils Absolute: 0.1 10*3/uL (ref 0.0–0.5)
Eosinophils Relative: 1 %
HCT: 42 % (ref 36.0–46.0)
Hemoglobin: 14 g/dL (ref 12.0–15.0)
Immature Granulocytes: 0 %
Lymphocytes Relative: 15 %
Lymphs Abs: 1 10*3/uL (ref 0.7–4.0)
MCH: 33.7 pg (ref 26.0–34.0)
MCHC: 33.3 g/dL (ref 30.0–36.0)
MCV: 101.2 fL — ABNORMAL HIGH (ref 80.0–100.0)
Monocytes Absolute: 0.7 10*3/uL (ref 0.1–1.0)
Monocytes Relative: 11 %
Neutro Abs: 4.7 10*3/uL (ref 1.7–7.7)
Neutrophils Relative %: 72 %
Platelets: 204 10*3/uL (ref 150–400)
RBC: 4.15 MIL/uL (ref 3.87–5.11)
RDW: 13.5 % (ref 11.5–15.5)
WBC: 6.4 10*3/uL (ref 4.0–10.5)
nRBC: 0 % (ref 0.0–0.2)

## 2023-05-06 ENCOUNTER — Ambulatory Visit (HOSPITAL_COMMUNITY): Payer: Medicare HMO | Admitting: Certified Registered"

## 2023-05-06 ENCOUNTER — Encounter (HOSPITAL_COMMUNITY)
Admission: RE | Disposition: A | Discharge status: Home / Self Care | Payer: Self-pay | Source: Home / Self Care | Attending: Internal Medicine

## 2023-05-06 ENCOUNTER — Ambulatory Visit (HOSPITAL_COMMUNITY)
Admission: RE | Admit: 2023-05-06 | Discharge: 2023-05-06 | Disposition: A | Discharge status: Home / Self Care | Payer: Medicare HMO | Attending: Internal Medicine | Admitting: Internal Medicine

## 2023-05-06 ENCOUNTER — Encounter (HOSPITAL_COMMUNITY): Payer: Self-pay | Admitting: Internal Medicine

## 2023-05-06 DIAGNOSIS — I10 Essential (primary) hypertension: Secondary | ICD-10-CM | POA: Insufficient documentation

## 2023-05-06 DIAGNOSIS — K5730 Diverticulosis of large intestine without perforation or abscess without bleeding: Secondary | ICD-10-CM | POA: Insufficient documentation

## 2023-05-06 DIAGNOSIS — R197 Diarrhea, unspecified: Secondary | ICD-10-CM

## 2023-05-06 DIAGNOSIS — I129 Hypertensive chronic kidney disease with stage 1 through stage 4 chronic kidney disease, or unspecified chronic kidney disease: Secondary | ICD-10-CM | POA: Diagnosis not present

## 2023-05-06 DIAGNOSIS — N189 Chronic kidney disease, unspecified: Secondary | ICD-10-CM

## 2023-05-06 DIAGNOSIS — F419 Anxiety disorder, unspecified: Secondary | ICD-10-CM | POA: Diagnosis not present

## 2023-05-06 DIAGNOSIS — Z8673 Personal history of transient ischemic attack (TIA), and cerebral infarction without residual deficits: Secondary | ICD-10-CM | POA: Insufficient documentation

## 2023-05-06 DIAGNOSIS — K921 Melena: Secondary | ICD-10-CM | POA: Diagnosis not present

## 2023-05-06 DIAGNOSIS — K625 Hemorrhage of anus and rectum: Secondary | ICD-10-CM

## 2023-05-06 DIAGNOSIS — Z7902 Long term (current) use of antithrombotics/antiplatelets: Secondary | ICD-10-CM | POA: Diagnosis not present

## 2023-05-06 DIAGNOSIS — R109 Unspecified abdominal pain: Secondary | ICD-10-CM

## 2023-05-06 DIAGNOSIS — Z8711 Personal history of peptic ulcer disease: Secondary | ICD-10-CM | POA: Diagnosis not present

## 2023-05-06 DIAGNOSIS — K635 Polyp of colon: Secondary | ICD-10-CM

## 2023-05-06 DIAGNOSIS — D124 Benign neoplasm of descending colon: Secondary | ICD-10-CM | POA: Insufficient documentation

## 2023-05-06 HISTORY — PX: COLONOSCOPY WITH PROPOFOL: SHX5780

## 2023-05-06 HISTORY — PX: POLYPECTOMY: SHX5525

## 2023-05-06 SURGERY — COLONOSCOPY WITH PROPOFOL
Anesthesia: General

## 2023-05-06 MED ORDER — ATROPINE SULFATE 0.4 MG/ML IV SOLN
INTRAVENOUS | Status: DC | PRN
Start: 2023-05-06 — End: 2023-05-06
  Administered 2023-05-06: 1 mg via INTRAVENOUS

## 2023-05-06 MED ORDER — LIDOCAINE HCL (CARDIAC) PF 100 MG/5ML IV SOSY
PREFILLED_SYRINGE | INTRAVENOUS | Status: DC | PRN
  Administered 2023-05-06: 60 mg via INTRAVENOUS

## 2023-05-06 MED ORDER — ATROPINE SULFATE 1 MG/ML IV SOLN
INTRAVENOUS | Status: AC
  Filled 2023-05-06: qty 1

## 2023-05-06 MED ORDER — PROPOFOL 10 MG/ML IV BOLUS
INTRAVENOUS | Status: DC | PRN
  Administered 2023-05-06: 50 mg via INTRAVENOUS
  Administered 2023-05-06: 30 mg via INTRAVENOUS

## 2023-05-06 MED ORDER — PHENYLEPHRINE 80 MCG/ML (10ML) SYRINGE FOR IV PUSH (FOR BLOOD PRESSURE SUPPORT)
PREFILLED_SYRINGE | INTRAVENOUS | Status: AC
  Filled 2023-05-06: qty 10

## 2023-05-06 MED ORDER — PROPOFOL 1000 MG/100ML IV EMUL
INTRAVENOUS | Status: AC
  Filled 2023-05-06: qty 100

## 2023-05-06 MED ORDER — STERILE WATER FOR IRRIGATION IR SOLN
Status: DC | PRN
  Administered 2023-05-06: 60 mL

## 2023-05-06 MED ORDER — GLYCOPYRROLATE PF 0.2 MG/ML IJ SOSY
PREFILLED_SYRINGE | INTRAMUSCULAR | Status: AC
  Filled 2023-05-06: qty 1

## 2023-05-06 MED ORDER — PROPOFOL 500 MG/50ML IV EMUL
INTRAVENOUS | Status: DC | PRN
  Administered 2023-05-06: 125 ug/kg/min via INTRAVENOUS

## 2023-05-06 MED ORDER — GLYCOPYRROLATE PF 0.2 MG/ML IJ SOSY
PREFILLED_SYRINGE | INTRAMUSCULAR | Status: DC | PRN
Start: 2023-05-06 — End: 2023-05-06
  Administered 2023-05-06: .2 mg via INTRAVENOUS

## 2023-05-06 MED ORDER — SODIUM CHLORIDE 0.9% FLUSH
INTRAVENOUS | Status: DC | PRN
Start: 2023-05-06 — End: 2023-05-06
  Administered 2023-05-06: 50 mL via INTRAVENOUS

## 2023-05-06 MED ORDER — LIDOCAINE HCL (PF) 2 % IJ SOLN
INTRAMUSCULAR | Status: AC
Start: 2023-05-06 — End: ?
  Filled 2023-05-06: qty 10

## 2023-05-06 MED ORDER — PHENYLEPHRINE 80 MCG/ML (10ML) SYRINGE FOR IV PUSH (FOR BLOOD PRESSURE SUPPORT)
PREFILLED_SYRINGE | INTRAVENOUS | Status: DC | PRN
Start: 2023-05-06 — End: 2023-05-06
  Administered 2023-05-06 (×2): 80 ug via INTRAVENOUS

## 2023-05-06 NOTE — Op Note (Signed)
[facility] Patient Name: Doris Ford Procedure Date: 05/06/2023 8:47 AM MRN: [id number] Date of Birth: [birth year] Attending MD: Gennette Pac , MD, [id number] CSN: [id number] Age: 82 Admit Type: Outpatient Procedure:                Colonoscopy Indications:              Hematochezia Providers:                Gennette Pac, MD, Francoise Ceo RN, RN, Angelica Ran, Lennice Sites Technician, Technician Referring MD:             Gennette Pac, MD Medicines:                Propofol per Anesthesia Complications:            No immediate complications. Estimated Blood Loss:     Estimated blood loss was minimal. Procedure:                Pre-Anesthesia Assessment:                           - Prior to the procedure, a History and Physical                            was performed, and patient medications and                            allergies were reviewed. The patient's tolerance of                            previous anesthesia was also reviewed. The risks                            and benefits of the procedure and the sedation                            options and risks were discussed with the patient.                            All questions were answered, and informed consent                            was obtained. Prior Anticoagulants: The patient has                            taken no anticoagulant or antiplatelet agents. ASA                            Grade Assessment: III - A patient with severe                            systemic disease. After reviewing the risks and  benefits, the patient was deemed in satisfactory                            condition to undergo the procedure.                           After obtaining informed consent, the colonoscope                            was passed under direct vision. Throughout the                            procedure, the patient's blood pressure, pulse, and                             oxygen saturations were monitored continuously. The                            [id number] scope was introduced through the                            anus and advanced to the the cecum, identified by                            appendiceal orifice and ileocecal valve. The                            colonoscopy was performed without difficulty. The                            patient tolerated the procedure well. The quality                            of the bowel preparation was adequate. The                            ileocecal valve, appendiceal orifice, and rectum                            were photographed. Scope In: 9:22:59 AM Scope Out: 9:45:59 AM Scope Withdrawal Time: 0 hours 6 minutes 32 seconds  Total Procedure Duration: 0 hours 23 minutes 0 seconds  Findings:      The perianal and digital rectal examinations were normal.      Multiple large-mouthed and medium-mouthed diverticula were found in the       entire colon.      A 5 mm polyp was found in the descending colon. The polyp was       semi-pedunculated. The polyp was removed with a cold snare. Resection       and retrieval were complete. Estimated blood loss was minimal.      The exam was otherwise without abnormality on direct and retroflexion       views. Impression:               - Diverticulosis in the entire examined colon.                           -  One 5 mm polyp in the descending colon, removed                            with a cold snare. Resected and retrieved.                           - The examination was otherwise normal on direct                            and retroflexion views. Moderate Sedation:      Moderate (conscious) sedation was personally administered by an       anesthesia professional. The following parameters were monitored: oxygen       saturation, heart rate, blood pressure, respiratory rate, EKG, adequacy       of pulmonary ventilation, and response to  care. Recommendation:           - Patient has a contact number available for                            emergencies. The signs and symptoms of potential                            delayed complications were discussed with the                            patient. Return to normal activities tomorrow.                            Written discharge instructions were provided to the                            patient.                           - Resume previous diet.                           - Continue present medications.                           - Repeat colonoscopy after studies are complete for                            surveillance.                           - Return to GI office in 6 months. Procedure Code(s):        --- Professional ---                           [phone], Colonoscopy, flexible; with removal of                            tumor(s), polyp(s), or other lesion(s) by snare  technique Diagnosis Code(s):        --- Professional ---                           D12.4, Benign neoplasm of descending colon                           K92.1, Melena (includes Hematochezia)                           K57.30, Diverticulosis of large intestine without                            perforation or abscess without bleeding CPT copyright 2022 American Medical Association. All rights reserved. The codes documented in this report are preliminary and upon coder review may  be revised to meet current compliance requirements. Gerrit Friends. Kristan Brummitt, MD Gennette Pac, MD 05/06/2023 10:05:05 AM This report has been signed electronically. Number of Addenda: 0

## 2023-05-06 NOTE — Discharge Instructions (Signed)
  Colonoscopy Discharge Instructions  Read the instructions outlined below and refer to this sheet in the next few weeks. These discharge instructions provide you with general information on caring for yourself after you leave the hospital. Your doctor may also give you specific instructions. While your treatment has been planned according to the most current medical practices available, unavoidable complications occasionally occur. If you have any problems or questions after discharge, call Dr. Jena Gauss at [phone]. ACTIVITY You may resume your regular activity, but move at a slower pace for the next 24 hours.  Take frequent rest periods for the next 24 hours.  Walking will help get rid of the air and reduce the bloated feeling in your belly (abdomen).  No driving for 24 hours (because of the medicine (anesthesia) used during the test).   Do not sign any important legal documents or operate any machinery for 24 hours (because of the anesthesia used during the test).  NUTRITION Drink plenty of fluids.  You may resume your normal diet as instructed by your doctor.  Begin with a light meal and progress to your normal diet. Heavy or fried foods are harder to digest and may make you feel sick to your stomach (nauseated).  Avoid alcoholic beverages for 24 hours or as instructed.  MEDICATIONS You may resume your normal medications unless your doctor tells you otherwise.  WHAT YOU CAN EXPECT TODAY Some feelings of bloating in the abdomen.  Passage of more gas than usual.  Spotting of blood in your stool or on the toilet paper.  IF YOU HAD POLYPS REMOVED DURING THE COLONOSCOPY: No aspirin products for 7 days or as instructed.  No alcohol for 7 days or as instructed.  Eat a soft diet for the next 24 hours.  FINDING OUT THE RESULTS OF YOUR TEST Not all test results are available during your visit. If your test results are not back during the visit, make an appointment with your caregiver to find out the  results. Do not assume everything is normal if you have not heard from your caregiver or the medical facility. It is important for you to follow up on all of your test results.  SEEK IMMEDIATE MEDICAL ATTENTION IF: You have more than a spotting of blood in your stool.  Your belly is swollen (abdominal distention).  You are nauseated or vomiting.  You have a temperature over 101.  You have abdominal pain or discomfort that is severe or gets worse throughout the day.      You have diverticulosis throughout your colon  1 small polyp removed  I suspect more likely you had a bout of self-limiting colitis although bleeding from diverticulosis is not excluded  At any rate, you do not need further evaluation so long as you do not have recurrent symptoms  I will send a letter out in about a week once I get the pathology report back on the polyp  Office visit with me in 6 months  At patient request, I called Marylu Lund at [phone] findings and recommendations

## 2023-05-06 NOTE — Anesthesia Preprocedure Evaluation (Signed)
Anesthesia Evaluation  Patient identified by MRN, date of birth, ID band Patient awake    Reviewed: Allergy & Precautions, H&P , NPO status , Patient's Chart, lab work & pertinent test results, reviewed documented beta blocker date and time   Airway Mallampati: II  TM Distance: >3 FB Neck ROM: full    Dental no notable dental hx.    Pulmonary neg pulmonary ROS   Pulmonary exam normal breath sounds clear to auscultation       Cardiovascular Exercise Tolerance: Good hypertension, negative cardio ROS  Rhythm:regular Rate:Normal     Neuro/Psych   Anxiety     CVA negative neurological ROS  negative psych ROS   GI/Hepatic negative GI ROS, Neg liver ROS, PUD,,,  Endo/Other  negative endocrine ROS    Renal/GU Renal diseasenegative Renal ROS  negative genitourinary   Musculoskeletal   Abdominal   Peds  Hematology negative hematology ROS (+)   Anesthesia Other Findings   Reproductive/Obstetrics negative OB ROS                             Anesthesia Physical Anesthesia Plan  ASA: 3  Anesthesia Plan: General   Post-op Pain Management:    Induction:   PONV Risk Score and Plan: Propofol infusion  Airway Management Planned:   Additional Equipment:   Intra-op Plan:   Post-operative Plan:   Informed Consent: I have reviewed the patients History and Physical, chart, labs and discussed the procedure including the risks, benefits and alternatives for the proposed anesthesia with the patient or authorized representative who has indicated his/her understanding and acceptance.     Dental Advisory Given  Plan Discussed with: CRNA  Anesthesia Plan Comments:        Anesthesia Quick Evaluation

## 2023-05-06 NOTE — Transfer of Care (Addendum)
Immediate Anesthesia Transfer of Care Note  Patient: Doris Ford  Procedure(s) Performed: COLONOSCOPY WITH PROPOFOL POLYPECTOMY  Patient Location: Short Stay  Anesthesia Type:General  Level of Consciousness: drowsy and patient cooperative  Airway & Oxygen Therapy: Patient Spontanous Breathing  Post-op Assessment: Report given to RN and Post -op Vital signs reviewed and stable  Post vital signs: Reviewed and stable  Last Vitals:  Vitals Value Taken Time  BP 130/73 05/06/23   0950  Temp 36.4 05/06/23   0950  Pulse 91 05/06/23   0950  Resp 20 05/06/23   0950  SpO2 99% 05/06/23   0950    Last Pain:  Vitals:   05/06/23 0921  TempSrc:   PainSc: 0-No pain         Complications: No notable events documented.

## 2023-05-06 NOTE — H&P (Signed)
@LOGO @   Primary Care Physician:  Benita Stabile, MD Primary Gastroenterologist:  Dr.   Pre-Procedure History & Physical: HPI:  Doris Ford is a 82 y.o. female here for further evaluation of self-limiting rectal bleeding and syncope several weeks ago.  No subsequent symptoms.  Last colonoscopy 2012 reportedly elsewhere results unknown.  Currently, no lower GI tract symptoms.  History of CVA on Plavix.  Past Medical History:  Diagnosis Date   Arthritis    Breast cancer (HCC) 06/2006   right breast/tx with lumpectomy and tamoxifen   COVID-19    Gastric ulcer    no NSAIDs   Hypertension    under control   Interstitial cystitis    Osteopenia    no change   Stroke [facility]) 2021    Past Surgical History:  Procedure Laterality Date   BILATERAL SALPINGOOPHORECTOMY  age 1s   BIOPSY  05/31/2019   Procedure: BIOPSY;  Surgeon: Corbin Ade, MD;  Location: [facility];  Service: Endoscopy;;   BREAST SURGERY Right    lumpectomy-multiple times   BUNIONECTOMY WITH HAMMERTOE RECONSTRUCTION Bilateral ~2000   CATARACT EXTRACTION Bilateral 2007   ESOPHAGOGASTRODUODENOSCOPY N/A 05/31/2019   Dr. Jena Gauss: widely patent Schatzki ring, non-bleeding cratered ulcer with adherent clot injected/thermally sealed. motled gastric mucosa with benign biopsy negative for H.pylori. plans for 3 month surveillance EGD.   ESOPHAGOGASTRODUODENOSCOPY N/A 10/05/2019   Procedure: ESOPHAGOGASTRODUODENOSCOPY (EGD);  Surgeon: Corbin Ade, MD;  Location: [facility];  Service: Endoscopy;  Laterality: N/A;  2:00pm - moved up per office   KNEE SURGERY Bilateral as teen   LOOP RECORDER INSERTION N/A 01/04/2020   Procedure: LOOP RECORDER INSERTION;  Surgeon: Regan Lemming, MD;  Location: [facility];  Service: Cardiovascular;  Laterality: N/A;   TONSILLECTOMY AND ADENOIDECTOMY  age 1   TOTAL HIP ARTHROPLASTY Right 12/16/2013   Procedure: RIGHT TOTAL HIP ARTHROPLASTY ANTERIOR APPROACH;  Surgeon: Kathryne Hitch, MD;  Location: [facility];  Service: Orthopedics;  Laterality: Right;   TOTAL KNEE ARTHROPLASTY Right 12/09/2022   Procedure: TOTAL KNEE ARTHROPLASTY;  Surgeon: Durene Romans, MD;  Location: [facility];  Service: Orthopedics;  Laterality: Right;   TOTAL VAGINAL HYSTERECTOMY  age 23    Prior to Admission medications   Medication Sig Start Date End Date Taking? Authorizing Provider  amLODipine (NORVASC) 5 MG tablet Take 5 mg by mouth every evening.   Yes [provider]  Ascorbic Acid (VITAMIN C) 1000 MG tablet Take 1,000 mg by mouth daily.   Yes [provider]  atorvastatin (LIPITOR) 40 MG tablet Take 1 tablet (40 mg total) by mouth daily. 01/05/20  Yes Rodolph Bong, MD  clopidogrel (PLAVIX) 75 MG tablet Take 1 tablet (75 mg total) by mouth daily. 01/05/20  Yes Rodolph Bong, MD  hydrochlorothiazide (HYDRODIURIL) 12.5 MG tablet Take 12.5 mg by mouth daily. 02/09/20  Yes [provider]  sertraline (ZOLOFT) 50 MG tablet Take 1.5 tablets (75 mg total) by mouth daily. 05/15/21  Yes McCue, Shanda Bumps, NP  acetaminophen (TYLENOL) 650 MG CR tablet Take 650-1,300 mg by mouth 2 (two) times daily as needed for pain.    [provider]  Calcium-Vitamin D (CALTRATE 600 PLUS-VIT D PO) Take 1 tablet by mouth daily.     [provider]  Cholecalciferol (VITAMIN D3) 50 MCG (2000 UT) TABS Take 2,000 Units by mouth daily.    [provider]  fluticasone (FLONASE) 50 MCG/ACT nasal spray Place 2 sprays  into both nostrils daily. Patient not taking: Reported on 04/28/2023 09/27/21   Wallis Bamberg, PA-C  Polyethyl Glyc-Propyl Glyc PF (SYSTANE HYDRATION PF) 0.4-0.3 % SOLN Place 1 drop into both eyes daily as needed (dry eyes).    [provider]  polyethylene glycol (MIRALAX / GLYCOLAX) 17 g packet Take 17 g by mouth daily as needed for mild constipation. 06/03/19   Shon Hale, MD  potassium chloride SA (KLOR-CON M) 20 MEQ tablet Take 1 tablet (20  mEq total) by mouth daily for 14 days. 12/10/22 04/28/23  Cassandria Anger, PA-C  Sod Picosulfate-Mag Ox-Cit Acd (CLENPIQ) 10-3.5-12 MG-GM -GM/175ML SOLN Take 1 kit by mouth as directed. 04/10/23   Corbin Ade, MD    Allergies as of 04/10/2023 - Review Complete 03/23/2023  Allergen Reaction Noted   Codeine Nausea Only 01/10/2014   Macrobid [nitrofurantoin] Other (See Comments) 10/10/2019    Family History  Problem Relation Age of Onset   Cancer Mother        breast/mastectomy   Heart disease Mother        poor circulation   Deep vein thrombosis Mother    Osteoporosis Mother    Hypertension Father    Colon cancer Maternal Aunt    Sleep apnea Neg Hx     Social History   Socioeconomic History   Marital status: Married    Spouse name: Not on file   Number of children: Not on file   Years of education: Not on file   Highest education level: Not on file  Occupational History   Not on file  Tobacco Use   Smoking status: Never   Smokeless tobacco: Never  Vaping Use   Vaping status: Never Used  Substance and Sexual Activity   Alcohol use: No   Drug use: No   Sexual activity: Not Currently    Partners: Male    Birth control/protection: Surgical    Comment: [facility]  Other Topics Concern   Not on file  Social History Narrative   Not on file   Social Determinants of Health   Financial Resource Strain: Not on file  Food Insecurity: No Food Insecurity (12/09/2022)   Hunger Vital Sign    Worried About Running Out of Food in the Last Year: Never true    Ran Out of Food in the Last Year: Never true  Transportation Needs: No Transportation Needs (12/09/2022)   PRAPARE - Administrator, Civil Service (Medical): No    Lack of Transportation (Non-Medical): No  Physical Activity: Not on file  Stress: Not on file  Social Connections: Not on file  Intimate Partner Violence: Not At Risk (12/09/2022)   Humiliation, Afraid, Rape, and Kick questionnaire    Fear of Current  or Ex-Partner: No    Emotionally Abused: No    Physically Abused: No    Sexually Abused: No    Review of Systems: See HPI, otherwise negative ROS  Physical Exam: BP 135/61   Pulse 67   Temp 97.8 F (36.6 C) (Oral)   Resp 15   Ht 5' 4.5" (1.638 m)   Wt 49.4 kg   LMP  (LMP Unknown)   SpO2 98%   BMI 18.41 kg/m  General:   Alert,  Well-developed, well-nourished, pleasant and cooperative in NAD Neck:  Supple; no masses or thyromegaly. No significant cervical adenopathy. Lungs:  Clear throughout to auscultation.   No wheezes, crackles, or rhonchi. No acute distress. Heart:  Regular rate and rhythm; no  murmurs, clicks, rubs,  or gallops. Abdomen: Non-distended, normal bowel sounds.  Soft and nontender without appreciable mass or hepatosplenomegaly.    Impression/Plan: 82 year old lady with self-limiting presyncope abdominal cramps and diarrhea.  She is back to baseline with no lower GI tract symptoms.  Suspect ischemic or segmental colitis of although other possibilities exist.  Recommendations: I have offered the patient a diagnostic colonoscopy today.  The risks, benefits, limitations, alternatives and imponderables have been reviewed with the patient. Questions have been answered.  Patient and daughter agreeable.    Notice: This dictation was prepared with Dragon dictation along with smaller phrase technology. Any transcriptional errors that result from this process are unintentional and may not be corrected upon review.

## 2023-05-07 LAB — SURGICAL PATHOLOGY

## 2023-05-08 NOTE — Anesthesia Postprocedure Evaluation (Signed)
Anesthesia Post Note  Patient: Doris Ford  Procedure(s) Performed: COLONOSCOPY WITH PROPOFOL POLYPECTOMY  Patient location during evaluation: Phase II Anesthesia Type: General Level of consciousness: awake Pain management: pain level controlled Vital Signs Assessment: post-procedure vital signs reviewed and stable Respiratory status: spontaneous breathing and respiratory function stable Cardiovascular status: blood pressure returned to baseline and stable Postop Assessment: no headache and no apparent nausea or vomiting Anesthetic complications: no Comments: Late entry   No notable events documented.   Last Vitals:  Vitals:   05/06/23 0757 05/06/23 0950  BP: 135/61 130/73  Pulse: 67 91  Resp: 15 20  Temp: 36.6 C (!) 36.4 C  SpO2: 98% 99%    Last Pain:  Vitals:   05/07/23 1407  TempSrc:   PainSc: 0-No pain                 Windell Norfolk

## 2023-05-11 ENCOUNTER — Encounter: Payer: Self-pay | Admitting: Internal Medicine

## 2023-05-14 ENCOUNTER — Encounter (HOSPITAL_COMMUNITY): Payer: Self-pay | Admitting: Internal Medicine

## 2023-05-18 ENCOUNTER — Ambulatory Visit: Payer: Medicare HMO | Attending: Cardiology

## 2023-05-18 DIAGNOSIS — I639 Cerebral infarction, unspecified: Secondary | ICD-10-CM | POA: Diagnosis not present

## 2023-05-19 LAB — CUP PACEART REMOTE DEVICE CHECK
Date Time Interrogation Session: 20241020230327
Implantable Pulse Generator Implant Date: 20210609

## 2023-05-19 NOTE — Patient Instructions (Signed)
SURGICAL WAITING ROOM VISITATION Patients having surgery or a procedure may have no more than 2 support people in the waiting area - these visitors may rotate.    Children under the age of 11 must have an adult with them who is not the patient.  If the patient needs to stay at the hospital during part of their recovery, the visitor guidelines for inpatient rooms apply. Pre-op nurse will coordinate an appropriate time for 1 support person to accompany patient in pre-op.  This support person may not rotate.    Please refer to the [facility] website for the visitor guidelines for Inpatients (after your surgery is over and you are in a regular room).       Your procedure is scheduled on: 05-26-23   Report to [facility] Main Entrance    Report to admitting at 10:10 AM   Call this number if you have problems the morning of surgery [phone]   Do not eat food :After Midnight.   After Midnight you may have the following liquids until 9:40 AM DAY OF SURGERY  Water Non-Citrus Juices (without pulp, NO RED-Apple, White grape, White cranberry) Black Coffee (NO MILK/CREAM OR CREAMERS, sugar ok)  Clear Tea (NO MILK/CREAM OR CREAMERS, sugar ok) regular and decaf                             Plain Jell-O (NO RED)                                           Fruit ices (not with fruit pulp, NO RED)                                     Popsicles (NO RED)                                                               Sports drinks like Gatorade (NO RED)                   The day of surgery:  Drink ONE (1) Pre-Surgery G2 by 9:40 AM the morning of surgery. Drink in one sitting. Do not sip.  This drink was given to you during your hospital  pre-op appointment visit. Nothing else to drink after completing the Pre-Surgery G2           If you have questions, please contact your surgeon's office.   FOLLOW  ANY ADDITIONAL PRE OP INSTRUCTIONS YOU RECEIVED FROM YOUR SURGEON'S OFFICE!!!      Oral Hygiene is also important to reduce your risk of infection.                                    Remember - BRUSH YOUR TEETH THE MORNING OF SURGERY WITH YOUR REGULAR TOOTHPASTE   Do NOT smoke after Midnight   Take these medicines the morning of surgery with A SIP OF WATER:   Amlodipine  Atorvastatin  Sertraline  Tylenol if needed   Stop all vitamins and herbal supplements 7 days before surgery                              You may not have any metal on your body including hair pins, jewelry, and body piercing             Do not wear make-up, lotions, powders, perfumes or deodorant  Do not wear nail polish including gel and S&S, artificial/acrylic nails, or any other type of covering on natural nails including finger and toenails. If you have artificial nails, gel coating, etc. that needs to be removed by a nail salon please have this removed prior to surgery or surgery may need to be canceled/ delayed if the surgeon/ anesthesia feels like they are unable to be safely monitored.   Do not shave  48 hours prior to surgery.        Do not bring valuables to the hospital. [facility] IS NOT RESPONSIBLE   FOR VALUABLES.   Contacts, dentures or bridgework may not be worn into surgery.   Bring small overnight bag day of surgery.   DO NOT BRING YOUR HOME MEDICATIONS TO THE HOSPITAL. PHARMACY WILL DISPENSE MEDICATIONS LISTED ON YOUR MEDICATION LIST TO YOU DURING YOUR ADMISSION IN THE HOSPITAL!    Special Instructions: Bring a copy of your healthcare power of attorney and living will documents the day of surgery if you haven't scanned them before.              Please read over the following fact sheets you were given: IF YOU HAVE QUESTIONS ABOUT YOUR PRE-OP INSTRUCTIONS PLEASE CALL [phone] [facility]  If you received a COVID test during your pre-op visit  it is requested that you wear a mask when out in public, stay away from anyone that may not be feeling well and notify your surgeon if you  develop symptoms. If you test positive for Covid or have been in contact with anyone that has tested positive in the last 10 days please notify you surgeon.    Pre-operative 5 CHG Bath Instructions   You can play a key role in reducing the risk of infection after surgery. Your skin needs to be as free of germs as possible. You can reduce the number of germs on your skin by washing with CHG (chlorhexidine gluconate) soap before surgery. CHG is an antiseptic soap that kills germs and continues to kill germs even after washing.   DO NOT use if you have an allergy to chlorhexidine/CHG or antibacterial soaps. If your skin becomes reddened or irritated, stop using the CHG and notify one of our RNs at [phone].   Please shower with the CHG soap starting 4 days before surgery using the following schedule:     Please keep in mind the following:  DO NOT shave, including legs and underarms, starting the day of your first shower.   You may shave your face at any point before/day of surgery.  Place clean sheets on your bed the day you start using CHG soap. Use a clean washcloth (not used since being washed) for each shower. DO NOT sleep with pets once you start using the CHG.   CHG Shower Instructions:  If you choose to wash your hair and private area, wash first with your normal shampoo/soap.  After you use shampoo/soap, rinse your hair and body thoroughly to remove shampoo/soap residue.  Turn the water OFF and apply about 3 tablespoons (45 ml) of CHG soap to a CLEAN washcloth.  Apply CHG soap ONLY FROM YOUR NECK DOWN TO YOUR TOES (washing for 3-5 minutes)  DO NOT use CHG soap on face, private areas, open wounds, or sores.  Pay special attention to the area where your surgery is being performed.  If you are having back surgery, having someone wash your back for you may be helpful. Wait 2 minutes after CHG soap is applied, then you may rinse off the CHG soap.  Pat dry with a clean towel  Put on  clean clothes/pajamas   If you choose to wear lotion, please use ONLY the CHG-compatible lotions on the back of this paper.     Additional instructions for the day of surgery: DO NOT APPLY any lotions, deodorants, cologne, or perfumes.   Put on clean/comfortable clothes.  Brush your teeth.  Ask your nurse before applying any prescription medications to the skin.      CHG Compatible Lotions   Aveeno Moisturizing lotion  Cetaphil Moisturizing Cream  Cetaphil Moisturizing Lotion  Clairol Herbal Essence Moisturizing Lotion, Dry Skin  Clairol Herbal Essence Moisturizing Lotion, Extra Dry Skin  Clairol Herbal Essence Moisturizing Lotion, Normal Skin  Curel Age Defying Therapeutic Moisturizing Lotion with Alpha Hydroxy  Curel Extreme Care Body Lotion  Curel Soothing Hands Moisturizing Hand Lotion  Curel Therapeutic Moisturizing Cream, Fragrance-Free  Curel Therapeutic Moisturizing Lotion, Fragrance-Free  Curel Therapeutic Moisturizing Lotion, Original Formula  Eucerin Daily Replenishing Lotion  Eucerin Dry Skin Therapy Plus Alpha Hydroxy Crme  Eucerin Dry Skin Therapy Plus Alpha Hydroxy Lotion  Eucerin Original Crme  Eucerin Original Lotion  Eucerin Plus Crme Eucerin Plus Lotion  Eucerin TriLipid Replenishing Lotion  Keri Anti-Bacterial Hand Lotion  Keri Deep Conditioning Original Lotion Dry Skin Formula Softly Scented  Keri Deep Conditioning Original Lotion, Fragrance Free Sensitive Skin Formula  Keri Lotion Fast Absorbing Fragrance Free Sensitive Skin Formula  Keri Lotion Fast Absorbing Softly Scented Dry Skin Formula  Keri Original Lotion  Keri Skin Renewal Lotion Keri Silky Smooth Lotion  Keri Silky Smooth Sensitive Skin Lotion  Nivea Body Creamy Conditioning Oil  Nivea Body Extra Enriched Lotion  Nivea Body Original Lotion  Nivea Body Sheer Moisturizing Lotion Nivea Crme  Nivea Skin Firming Lotion  NutraDerm 30 Skin Lotion  NutraDerm Skin Lotion  NutraDerm  Therapeutic Skin Cream  NutraDerm Therapeutic Skin Lotion  ProShield Protective Hand Cream  Provon moisturizing lotion   PATIENT SIGNATURE_________________________________  NURSE SIGNATURE__________________________________  ________________________________________________________________________    Doris Ford  An incentive spirometer is a tool that can help keep your lungs clear and active. This tool measures how well you are filling your lungs with each breath. Taking long deep breaths may help reverse or decrease the chance of developing breathing (pulmonary) problems (especially infection) following: A long period of time when you are unable to move or be active. BEFORE THE PROCEDURE  If the spirometer includes an indicator to show your best effort, your nurse or respiratory therapist will set it to a desired goal. If possible, sit up straight or lean slightly forward. Try not to slouch. Hold the incentive spirometer in an upright position. INSTRUCTIONS FOR USE  Sit on the edge of your bed if possible, or sit up as far as you can in bed or on a chair. Hold the incentive spirometer in an upright position. Breathe out normally. Place the mouthpiece in your mouth and seal your lips tightly around  it. Breathe in slowly and as deeply as possible, raising the piston or the ball toward the top of the column. Hold your breath for 3-5 seconds or for as long as possible. Allow the piston or ball to fall to the bottom of the column. Remove the mouthpiece from your mouth and breathe out normally. Rest for a few seconds and repeat Steps 1 through 7 at least 10 times every 1-2 hours when you are awake. Take your time and take a few normal breaths between deep breaths. The spirometer may include an indicator to show your best effort. Use the indicator as a goal to work toward during each repetition. After each set of 10 deep breaths, practice coughing to be sure your lungs are clear. If  you have an incision (the cut made at the time of surgery), support your incision when coughing by placing a pillow or rolled up towels firmly against it. Once you are able to get out of bed, walk around indoors and cough well. You may stop using the incentive spirometer when instructed by your caregiver.  RISKS AND COMPLICATIONS Take your time so you do not get dizzy or light-headed. If you are in pain, you may need to take or ask for pain medication before doing incentive spirometry. It is harder to take a deep breath if you are having pain. AFTER USE Rest and breathe slowly and easily. It can be helpful to keep track of a log of your progress. Your caregiver can provide you with a simple table to help with this. If you are using the spirometer at home, follow these instructions: SEEK MEDICAL CARE IF:  You are having difficultly using the spirometer. You have trouble using the spirometer as often as instructed. Your pain medication is not giving enough relief while using the spirometer. You develop fever of 100.5 F (38.1 C) or higher. SEEK IMMEDIATE MEDICAL CARE IF:  You cough up bloody sputum that had not been present before. You develop fever of 102 F (38.9 C) or greater. You develop worsening pain at or near the incision site. MAKE SURE YOU:  Understand these instructions. Will watch your condition. Will get help right away if you are not doing well or get worse. Document Released: 11/24/2006 Document Revised: 10/06/2011 Document Reviewed: 01/25/2007 [facility] Patient Information 2014 [place], [place].   ________________________________________________________________________

## 2023-05-19 NOTE — Progress Notes (Addendum)
COVID Vaccine Completed:  Yes  Date of COVID positive in last 90 days:  No  PCP - Nita Sells, MD (notes on chart) Cardiologist - Francis Dowse, PA-C Neurologist - Ihor Austin, NP  Cardiac clearance in Epic dated 04-21-23 by Francis Dowse, PA-C  Medical clearance on chart  Chest x-ray - N/A EKG -04-21-23 Epic Stress Test - N/A ECHO - 01-02-20 Epic Cardiac Cath - N/A Pacemaker/ICD device last checked: Spinal Cord Stimulator: N/A Loop recorder - last check 04-08-23  Bowel Prep -  N/A  Sleep Study - Yes, neg sleep apnea CPAP - No  Fasting Blood Sugar -  Checks Blood Sugar _____ times a day  Last dose of GLP1 agonist-  N/A GLP1 instructions:  N/A   Last dose of SGLT-2 inhibitors-  N/A SGLT-2 instructions: N/A  Blood Thinner:  Plavix, patient states that she took her last dose on  05/18/23 Aspirin Instructions: Last Dose:  Activity level:  Can go up a flight of stairs and perform activities of daily living without stopping and without symptoms of chest pain or shortness of breath.  Some limitations due to knee pain  Anesthesia review:  CVA 2021 has loop recorder followed by cardio.  HTN, recent GI bleed    Patient denies shortness of breath, fever, cough and chest pain at PAT appointment  Patient verbalized understanding of instructions that were given to them at the PAT appointment. Patient was also instructed that they will need to review over the PAT instructions again at home before surgery.

## 2023-05-20 ENCOUNTER — Telehealth: Payer: Self-pay

## 2023-05-20 NOTE — Telephone Encounter (Signed)
Abnormal ILR reviewed. Notable for 3.5-second pause on 05/06/2023.  If asymptomatic, continue to monitor.. (Per Dr. Elberta Fortis review).    True Pause event occurred at 8:56 am on 05/06/23.  Patient states she usually sleeps in in the morning until about 9/930 so likely was asleep.   Denies any symptoms, is aware that this is normal during sleep.

## 2023-05-21 ENCOUNTER — Encounter (HOSPITAL_COMMUNITY)
Admission: RE | Admit: 2023-05-21 | Discharge: 2023-05-21 | Disposition: A | Discharge status: Home / Self Care | Payer: Medicare HMO | Source: Ambulatory Visit | Attending: Orthopedic Surgery | Admitting: Orthopedic Surgery

## 2023-05-21 ENCOUNTER — Other Ambulatory Visit: Payer: Self-pay

## 2023-05-21 ENCOUNTER — Encounter (HOSPITAL_COMMUNITY): Payer: Self-pay

## 2023-05-21 VITALS — BP 132/64 | HR 76 | Temp 97.8°F | Resp 16 | Ht 64.0 in | Wt 108.8 lb

## 2023-05-21 DIAGNOSIS — D649 Anemia, unspecified: Secondary | ICD-10-CM | POA: Insufficient documentation

## 2023-05-21 DIAGNOSIS — I4510 Unspecified right bundle-branch block: Secondary | ICD-10-CM | POA: Diagnosis not present

## 2023-05-21 DIAGNOSIS — I10 Essential (primary) hypertension: Secondary | ICD-10-CM | POA: Diagnosis not present

## 2023-05-21 DIAGNOSIS — Z01812 Encounter for preprocedural laboratory examination: Secondary | ICD-10-CM | POA: Diagnosis not present

## 2023-05-21 DIAGNOSIS — M1712 Unilateral primary osteoarthritis, left knee: Secondary | ICD-10-CM | POA: Insufficient documentation

## 2023-05-21 DIAGNOSIS — Z8673 Personal history of transient ischemic attack (TIA), and cerebral infarction without residual deficits: Secondary | ICD-10-CM | POA: Diagnosis not present

## 2023-05-21 DIAGNOSIS — Z7902 Long term (current) use of antithrombotics/antiplatelets: Secondary | ICD-10-CM | POA: Diagnosis not present

## 2023-05-21 DIAGNOSIS — Z01818 Encounter for other preprocedural examination: Secondary | ICD-10-CM

## 2023-05-21 LAB — CBC
HCT: 42.1 % (ref 36.0–46.0)
Hemoglobin: 13.8 g/dL (ref 12.0–15.0)
MCH: 33.6 pg (ref 26.0–34.0)
MCHC: 32.8 g/dL (ref 30.0–36.0)
MCV: 102.4 fL — ABNORMAL HIGH (ref 80.0–100.0)
Platelets: 267 10*3/uL (ref 150–400)
RBC: 4.11 MIL/uL (ref 3.87–5.11)
RDW: 13.5 % (ref 11.5–15.5)
WBC: 9 10*3/uL (ref 4.0–10.5)
nRBC: 0 % (ref 0.0–0.2)

## 2023-05-21 LAB — BASIC METABOLIC PANEL
Anion gap: 10 (ref 5–15)
BUN: 32 mg/dL — ABNORMAL HIGH (ref 8–23)
CO2: 29 mmol/L (ref 22–32)
Calcium: 9.6 mg/dL (ref 8.9–10.3)
Chloride: 102 mmol/L (ref 98–111)
Creatinine, Ser: 0.67 mg/dL (ref 0.44–1.00)
GFR, Estimated: >60 mL/min (ref >60)
Glucose, Bld: 93 mg/dL (ref 70–99)
Potassium: 3.9 mmol/L (ref 3.5–5.1)
Sodium: 141 mmol/L (ref 135–145)

## 2023-05-21 LAB — SURGICAL PCR SCREEN
MRSA, PCR: NEGATIVE
Staphylococcus aureus: NEGATIVE

## 2023-05-22 NOTE — Progress Notes (Signed)
Anesthesia Chart Review   Case: [id number] Date/Time: 05/26/23 1225   Procedure: TOTAL KNEE ARTHROPLASTY (Left: Knee)   Anesthesia type: Spinal   Pre-op diagnosis: Left knee osteoarthritis   Location: [facility] 09 / [facility]   Surgeons: Durene Romans, MD       DISCUSSION:82 y.o. never smoker with h/o HTN, stroke, RBBB, left knee OA scheduled for above procedure 05/26/2023 with Dr. Durene Romans.   Pt last seen by cardiology 04/21/23. Per OV note, "Low cardiac risk surgery Low cardiac risk score Despite her knee she remains active No difficulties with her ADLs No symptoms to suggest change clinical change from her last echo No new or additional cardia evaluation prior to knee surgery or colonoscopy No cardiac contraindication to colonoscopy/knee surgery   She is not on Plavix for cardiac reason, defer to her PMD recommendations for interruption of this "  Pt on plavix given h/o stroke. She has been cleared by PCP, clearance states optimized at low risk. May hold Plavix 5 days prior to procedure.  VS: BP 132/64   Pulse 76   Temp 36.6 C (Oral)   Resp 16   Ht 5\' 4"  (1.626 m)   Wt 49.4 kg   LMP  (LMP Unknown)   SpO2 96%   BMI 18.68 kg/m   PROVIDERS: Benita Stabile, MD is PCP    LABS: Labs reviewed: Acceptable for surgery. (all labs ordered are listed, but only abnormal results are displayed)  Labs Reviewed  BASIC METABOLIC PANEL - Abnormal; Notable for the following components:      Result Value   BUN 32 (*)    All other components within normal limits  CBC - Abnormal; Notable for the following components:   MCV 102.4 (*)    All other components within normal limits  SURGICAL PCR SCREEN     IMAGES:   EKG:   CV: Echo 01/02/2020  1. Left ventricular ejection fraction, by estimation, is 60 to 65%. The  left ventricle has normal function. The left ventricle has no regional  wall motion abnormalities. There is mild left ventricular hypertrophy.  Left ventricular  diastolic parameters  are consistent with Grade I diastolic dysfunction (impaired relaxation).   2. Right ventricular systolic function is normal. The right ventricular  size is normal. There is normal pulmonary artery systolic pressure.   3. The mitral valve is grossly normal. Trivial mitral valve  regurgitation.   4. The aortic valve is tricuspid. Aortic valve regurgitation is not  visualized.   5. The inferior vena cava is normal in size with greater than 50%  respiratory variability, suggesting right atrial pressure of 3 mmHg.   6. Trivial to small pericardial effusion. No tamponade features.  Past Medical History:  Diagnosis Date   Arthritis    Breast cancer (HCC) 06/2006   right breast/tx with lumpectomy and tamoxifen   COVID-19    Gastric ulcer    no NSAIDs   Hypertension    under control   Interstitial cystitis    Osteopenia    no change   Stroke [facility]) 2021    Past Surgical History:  Procedure Laterality Date   BILATERAL SALPINGOOPHORECTOMY  age 35s   BIOPSY  05/31/2019   Procedure: BIOPSY;  Surgeon: Corbin Ade, MD;  [facility];  Service: Endoscopy;;   BREAST SURGERY Right    lumpectomy-multiple times   BUNIONECTOMY WITH HAMMERTOE RECONSTRUCTION Bilateral ~2000   CATARACT EXTRACTION Bilateral 2007   COLONOSCOPY WITH PROPOFOL N/A  05/06/2023   Procedure: COLONOSCOPY WITH PROPOFOL;  Surgeon: Corbin Ade, MD;  [facility];  Service: Endoscopy;  Laterality: N/A;  930am, asa 3   ESOPHAGOGASTRODUODENOSCOPY N/A 05/31/2019   Dr. Jena Gauss: widely patent Schatzki ring, non-bleeding cratered ulcer with adherent clot injected/thermally sealed. motled gastric mucosa with benign biopsy negative for H.pylori. plans for 3 month surveillance EGD.   ESOPHAGOGASTRODUODENOSCOPY N/A 10/05/2019   Procedure: ESOPHAGOGASTRODUODENOSCOPY (EGD);  Surgeon: Corbin Ade, MD;  [facility];  Service: Endoscopy;  Laterality: N/A;  2:00pm - moved up per office    KNEE SURGERY Bilateral as teen   LOOP RECORDER INSERTION N/A 01/04/2020   Procedure: LOOP RECORDER INSERTION;  Surgeon: Regan Lemming, MD;  [facility]: [facility];  Service: Cardiovascular;  Laterality: N/A;   POLYPECTOMY  05/06/2023   Procedure: POLYPECTOMY;  Surgeon: Corbin Ade, MD;  [facility];  Service: Endoscopy;;   TONSILLECTOMY AND ADENOIDECTOMY  age 71   TOTAL HIP ARTHROPLASTY Right 12/16/2013   Procedure: RIGHT TOTAL HIP ARTHROPLASTY ANTERIOR APPROACH;  Surgeon: Kathryne Hitch, MD;  Location: [facility];  Service: Orthopedics;  Laterality: Right;   TOTAL KNEE ARTHROPLASTY Right 12/09/2022   Procedure: TOTAL KNEE ARTHROPLASTY;  Surgeon: Durene Romans, MD;  Location: [facility];  Service: Orthopedics;  Laterality: Right;   TOTAL VAGINAL HYSTERECTOMY  age 37    MEDICATIONS:  acetaminophen (TYLENOL) 650 MG CR tablet   amLODipine (NORVASC) 5 MG tablet   Ascorbic Acid (VITAMIN C) 1000 MG tablet   atorvastatin (LIPITOR) 40 MG tablet   Calcium-Vitamin D (CALTRATE 600 PLUS-VIT D PO)   Cholecalciferol (VITAMIN D3) 50 MCG (2000 UT) TABS   clopidogrel (PLAVIX) 75 MG tablet   fluticasone (FLONASE) 50 MCG/ACT nasal spray   hydrochlorothiazide (HYDRODIURIL) 12.5 MG tablet   Polyethyl Glyc-Propyl Glyc PF (SYSTANE HYDRATION PF) 0.4-0.3 % SOLN   polyethylene glycol (MIRALAX / GLYCOLAX) 17 g packet   potassium chloride SA (KLOR-CON M) 20 MEQ tablet   sertraline (ZOLOFT) 50 MG tablet   Sod Picosulfate-Mag Ox-Cit Acd (CLENPIQ) 10-3.5-12 MG-GM -GM/175ML SOLN   No current facility-administered medications for this encounter.     Jodell Cipro Ward, PA-C [facility] [phone]

## 2023-05-26 ENCOUNTER — Encounter (HOSPITAL_COMMUNITY)
Admission: RE | Disposition: A | Discharge status: Home / Self Care | Payer: Self-pay | Source: Ambulatory Visit | Attending: Orthopedic Surgery

## 2023-05-26 ENCOUNTER — Other Ambulatory Visit: Payer: Self-pay

## 2023-05-26 ENCOUNTER — Ambulatory Visit (HOSPITAL_COMMUNITY): Payer: Medicare HMO | Admitting: Physician Assistant

## 2023-05-26 ENCOUNTER — Observation Stay (HOSPITAL_COMMUNITY)
Admission: RE | Admit: 2023-05-26 | Discharge: 2023-05-27 | Disposition: A | Discharge status: Home / Self Care | Payer: Medicare HMO | Source: Ambulatory Visit | Attending: Orthopedic Surgery | Admitting: Orthopedic Surgery

## 2023-05-26 ENCOUNTER — Encounter (HOSPITAL_COMMUNITY): Payer: Self-pay | Admitting: Orthopedic Surgery

## 2023-05-26 ENCOUNTER — Ambulatory Visit (HOSPITAL_COMMUNITY): Payer: Medicare HMO | Admitting: Anesthesiology

## 2023-05-26 DIAGNOSIS — Z96652 Presence of left artificial knee joint: Principal | ICD-10-CM

## 2023-05-26 DIAGNOSIS — N182 Chronic kidney disease, stage 2 (mild): Secondary | ICD-10-CM | POA: Insufficient documentation

## 2023-05-26 DIAGNOSIS — N189 Chronic kidney disease, unspecified: Secondary | ICD-10-CM | POA: Diagnosis not present

## 2023-05-26 DIAGNOSIS — Z79899 Other long term (current) drug therapy: Secondary | ICD-10-CM | POA: Insufficient documentation

## 2023-05-26 DIAGNOSIS — Z8616 Personal history of COVID-19: Secondary | ICD-10-CM | POA: Diagnosis not present

## 2023-05-26 DIAGNOSIS — I10 Essential (primary) hypertension: Secondary | ICD-10-CM | POA: Diagnosis not present

## 2023-05-26 DIAGNOSIS — G8918 Other acute postprocedural pain: Secondary | ICD-10-CM | POA: Diagnosis not present

## 2023-05-26 DIAGNOSIS — E785 Hyperlipidemia, unspecified: Secondary | ICD-10-CM | POA: Diagnosis not present

## 2023-05-26 DIAGNOSIS — Z853 Personal history of malignant neoplasm of breast: Secondary | ICD-10-CM | POA: Diagnosis not present

## 2023-05-26 DIAGNOSIS — Z96641 Presence of right artificial hip joint: Secondary | ICD-10-CM | POA: Insufficient documentation

## 2023-05-26 DIAGNOSIS — Z96651 Presence of right artificial knee joint: Secondary | ICD-10-CM | POA: Diagnosis not present

## 2023-05-26 DIAGNOSIS — M1712 Unilateral primary osteoarthritis, left knee: Secondary | ICD-10-CM | POA: Diagnosis not present

## 2023-05-26 DIAGNOSIS — I129 Hypertensive chronic kidney disease with stage 1 through stage 4 chronic kidney disease, or unspecified chronic kidney disease: Secondary | ICD-10-CM | POA: Insufficient documentation

## 2023-05-26 DIAGNOSIS — Z8673 Personal history of transient ischemic attack (TIA), and cerebral infarction without residual deficits: Secondary | ICD-10-CM | POA: Insufficient documentation

## 2023-05-26 HISTORY — PX: TOTAL KNEE ARTHROPLASTY: SHX125

## 2023-05-26 SURGERY — ARTHROPLASTY, KNEE, TOTAL
Anesthesia: Spinal | Site: Knee | Laterality: Left

## 2023-05-26 MED ORDER — SERTRALINE HCL 50 MG PO TABS
75.0000 mg | ORAL_TABLET | Freq: Every day | ORAL | Status: DC
  Administered 2023-05-26: 75 mg via ORAL
  Filled 2023-05-26 (×2): qty 1

## 2023-05-26 MED ORDER — ORAL CARE MOUTH RINSE: 15.0000 mL | Freq: Once | OROMUCOSAL | Status: AC

## 2023-05-26 MED ORDER — PHENYLEPHRINE HCL-NACL 20-0.9 MG/250ML-% IV SOLN
INTRAVENOUS | Status: DC | PRN
  Administered 2023-05-26: 50 ug/min via INTRAVENOUS

## 2023-05-26 MED ORDER — ONDANSETRON HCL 4 MG PO TABS: 4.0000 mg | ORAL_TABLET | Freq: Four times a day (QID) | ORAL | Status: DC | PRN

## 2023-05-26 MED ORDER — PROPOFOL 500 MG/50ML IV EMUL
INTRAVENOUS | Status: AC
  Filled 2023-05-26: qty 50

## 2023-05-26 MED ORDER — METHOCARBAMOL 500 MG PO TABS
500.0000 mg | ORAL_TABLET | Freq: Four times a day (QID) | ORAL | Status: DC | PRN
  Administered 2023-05-26: 500 mg via ORAL
  Filled 2023-05-26: qty 1

## 2023-05-26 MED ORDER — TRANEXAMIC ACID-NACL 1000-0.7 MG/100ML-% IV SOLN
1000.0000 mg | Freq: Once | INTRAVENOUS | Status: AC
Start: 2023-05-26 — End: 2023-05-26
  Administered 2023-05-26: 1000 mg via INTRAVENOUS
  Filled 2023-05-26: qty 100

## 2023-05-26 MED ORDER — LACTATED RINGERS IV SOLN: INTRAVENOUS | Status: DC

## 2023-05-26 MED ORDER — METOCLOPRAMIDE HCL 5 MG/ML IJ SOLN: 5.0000 mg | Freq: Three times a day (TID) | INTRAMUSCULAR | Status: DC | PRN

## 2023-05-26 MED ORDER — FENTANYL CITRATE PF 50 MCG/ML IJ SOSY: 25.0000 ug | PREFILLED_SYRINGE | INTRAMUSCULAR | Status: DC | PRN

## 2023-05-26 MED ORDER — ROPIVACAINE HCL 5 MG/ML IJ SOLN
INTRAMUSCULAR | Status: DC | PRN
  Administered 2023-05-26 (×5): 5 mL via PERINEURAL

## 2023-05-26 MED ORDER — CEFAZOLIN SODIUM-DEXTROSE 2-4 GM/100ML-% IV SOLN
2.0000 g | INTRAVENOUS | Status: AC
  Administered 2023-05-26: 2 g via INTRAVENOUS
  Filled 2023-05-26: qty 100

## 2023-05-26 MED ORDER — AMLODIPINE BESYLATE 5 MG PO TABS
5.0000 mg | ORAL_TABLET | Freq: Every day | ORAL | Status: DC
  Filled 2023-05-26: qty 1

## 2023-05-26 MED ORDER — ACETAMINOPHEN 500 MG PO TABS
1000.0000 mg | ORAL_TABLET | Freq: Four times a day (QID) | ORAL | Status: DC
  Administered 2023-05-26 – 2023-05-27 (×4): 1000 mg via ORAL
  Filled 2023-05-26 (×4): qty 2

## 2023-05-26 MED ORDER — MENTHOL 3 MG MT LOZG: 1.0000 | LOZENGE | OROMUCOSAL | Status: DC | PRN

## 2023-05-26 MED ORDER — TRANEXAMIC ACID-NACL 1000-0.7 MG/100ML-% IV SOLN
1000.0000 mg | INTRAVENOUS | Status: AC
  Administered 2023-05-26: 1000 mg via INTRAVENOUS
  Filled 2023-05-26: qty 100

## 2023-05-26 MED ORDER — ONDANSETRON HCL 4 MG/2ML IJ SOLN
INTRAMUSCULAR | Status: DC | PRN
  Administered 2023-05-26: 4 mg via INTRAVENOUS

## 2023-05-26 MED ORDER — DEXAMETHASONE SODIUM PHOSPHATE 10 MG/ML IJ SOLN
8.0000 mg | Freq: Once | INTRAMUSCULAR | Status: AC
  Administered 2023-05-26: 8 mg via INTRAVENOUS

## 2023-05-26 MED ORDER — SODIUM CHLORIDE 0.9% FLUSH: 10.0000 mL | Freq: Two times a day (BID) | INTRAVENOUS | Status: DC

## 2023-05-26 MED ORDER — ALUM & MAG HYDROXIDE-SIMETH 200-200-20 MG/5ML PO SUSP: 30.0000 mL | ORAL | Status: DC | PRN

## 2023-05-26 MED ORDER — ATORVASTATIN CALCIUM 40 MG PO TABS
40.0000 mg | ORAL_TABLET | Freq: Every day | ORAL | Status: DC
  Administered 2023-05-27: 40 mg via ORAL
  Filled 2023-05-26: qty 1

## 2023-05-26 MED ORDER — STERILE WATER FOR IRRIGATION IR SOLN
Status: DC | PRN
  Administered 2023-05-26: 1000 mL

## 2023-05-26 MED ORDER — 0.9 % SODIUM CHLORIDE (POUR BTL) OPTIME
TOPICAL | Status: DC | PRN
  Administered 2023-05-26: 1000 mL

## 2023-05-26 MED ORDER — ONDANSETRON HCL 4 MG/2ML IJ SOLN
INTRAMUSCULAR | Status: AC
  Filled 2023-05-26: qty 2

## 2023-05-26 MED ORDER — PROPOFOL 500 MG/50ML IV EMUL
INTRAVENOUS | Status: DC | PRN
  Administered 2023-05-26: 50 ug/kg/min via INTRAVENOUS

## 2023-05-26 MED ORDER — ALBUMIN HUMAN 5 % IV SOLN
12.5000 g | Freq: Once | INTRAVENOUS | Status: AC
  Administered 2023-05-26: 12.5 g via INTRAVENOUS

## 2023-05-26 MED ORDER — PROPOFOL 10 MG/ML IV BOLUS
INTRAVENOUS | Status: DC | PRN
  Administered 2023-05-26 (×2): 20 mg via INTRAVENOUS

## 2023-05-26 MED ORDER — ACETAMINOPHEN 10 MG/ML IV SOLN: 1000.0000 mg | Freq: Once | INTRAVENOUS | Status: DC | PRN

## 2023-05-26 MED ORDER — SODIUM CHLORIDE 0.9 % IR SOLN
Status: DC | PRN
  Administered 2023-05-26: 1000 mL

## 2023-05-26 MED ORDER — BUPIVACAINE IN DEXTROSE 0.75-8.25 % IT SOLN
INTRATHECAL | Status: DC | PRN
  Administered 2023-05-26: 1.4 mL via INTRATHECAL

## 2023-05-26 MED ORDER — POVIDONE-IODINE 10 % EX SWAB: 2.0000 | Freq: Once | CUTANEOUS | Status: DC

## 2023-05-26 MED ORDER — ONDANSETRON HCL 4 MG/2ML IJ SOLN: 4.0000 mg | Freq: Four times a day (QID) | INTRAMUSCULAR | Status: DC | PRN

## 2023-05-26 MED ORDER — CHLORHEXIDINE GLUCONATE 0.12 % MT SOLN
15.0000 mL | Freq: Once | OROMUCOSAL | Status: AC
  Administered 2023-05-26: 15 mL via OROMUCOSAL

## 2023-05-26 MED ORDER — OXYCODONE HCL 5 MG PO TABS: 10.0000 mg | ORAL_TABLET | ORAL | Status: DC | PRN

## 2023-05-26 MED ORDER — METOCLOPRAMIDE HCL 5 MG PO TABS: 5.0000 mg | ORAL_TABLET | Freq: Three times a day (TID) | ORAL | Status: DC | PRN

## 2023-05-26 MED ORDER — CEFAZOLIN SODIUM-DEXTROSE 2-4 GM/100ML-% IV SOLN
2.0000 g | Freq: Four times a day (QID) | INTRAVENOUS | Status: AC
  Administered 2023-05-26 (×2): 2 g via INTRAVENOUS
  Filled 2023-05-26 (×2): qty 100

## 2023-05-26 MED ORDER — SODIUM CHLORIDE (PF) 0.9 % IJ SOLN
INTRAMUSCULAR | Status: DC | PRN
  Administered 2023-05-26: 61 mL via SURGICAL_CAVITY

## 2023-05-26 MED ORDER — CLOPIDOGREL BISULFATE 75 MG PO TABS
75.0000 mg | ORAL_TABLET | Freq: Every day | ORAL | Status: DC
  Administered 2023-05-27: 75 mg via ORAL
  Filled 2023-05-26: qty 1

## 2023-05-26 MED ORDER — BISACODYL 10 MG RE SUPP: 10.0000 mg | Freq: Every day | RECTAL | Status: DC | PRN

## 2023-05-26 MED ORDER — DEXAMETHASONE SODIUM PHOSPHATE 10 MG/ML IJ SOLN
INTRAMUSCULAR | Status: AC
  Filled 2023-05-26: qty 1

## 2023-05-26 MED ORDER — ALBUMIN HUMAN 5 % IV SOLN
INTRAVENOUS | Status: AC
  Filled 2023-05-26: qty 250

## 2023-05-26 MED ORDER — POLYETHYLENE GLYCOL 3350 17 G PO PACK
17.0000 g | PACK | Freq: Two times a day (BID) | ORAL | Status: DC
  Administered 2023-05-26 – 2023-05-27 (×2): 17 g via ORAL
  Filled 2023-05-26 (×2): qty 1

## 2023-05-26 MED ORDER — METHOCARBAMOL 1000 MG/10ML IJ SOLN: 500.0000 mg | Freq: Four times a day (QID) | INTRAMUSCULAR | Status: DC | PRN

## 2023-05-26 MED ORDER — CLONIDINE HCL (ANALGESIA) 100 MCG/ML EP SOLN
EPIDURAL | Status: DC | PRN
  Administered 2023-05-26: 100 ug

## 2023-05-26 MED ORDER — DEXAMETHASONE SODIUM PHOSPHATE 10 MG/ML IJ SOLN
10.0000 mg | Freq: Once | INTRAMUSCULAR | Status: AC
  Administered 2023-05-27: 10 mg via INTRAVENOUS
  Filled 2023-05-26: qty 1

## 2023-05-26 MED ORDER — DEXAMETHASONE SODIUM PHOSPHATE 10 MG/ML IJ SOLN
INTRAMUSCULAR | Status: DC | PRN
  Administered 2023-05-26: 10 mg

## 2023-05-26 MED ORDER — EPHEDRINE 5 MG/ML INJ
INTRAVENOUS | Status: AC
Start: 2023-05-26 — End: ?
  Filled 2023-05-26: qty 5

## 2023-05-26 MED ORDER — DIPHENHYDRAMINE HCL 12.5 MG/5ML PO ELIX: 12.5000 mg | ORAL_SOLUTION | ORAL | Status: DC | PRN

## 2023-05-26 MED ORDER — EPHEDRINE SULFATE-NACL 50-0.9 MG/10ML-% IV SOSY
PREFILLED_SYRINGE | INTRAVENOUS | Status: DC | PRN
  Administered 2023-05-26: 5 mg via INTRAVENOUS

## 2023-05-26 MED ORDER — OXYCODONE HCL 5 MG PO TABS: 5.0000 mg | ORAL_TABLET | ORAL | Status: DC | PRN

## 2023-05-26 MED ORDER — SENNA 8.6 MG PO TABS
2.0000 | ORAL_TABLET | Freq: Every day | ORAL | Status: DC
  Administered 2023-05-26: 17.2 mg via ORAL
  Filled 2023-05-26: qty 2

## 2023-05-26 MED ORDER — HYDROMORPHONE HCL 1 MG/ML IJ SOLN: 0.5000 mg | INTRAMUSCULAR | Status: DC | PRN

## 2023-05-26 MED ORDER — HYDROCHLOROTHIAZIDE 12.5 MG PO TABS: 12.5000 mg | ORAL_TABLET | Freq: Every day | ORAL | Status: DC

## 2023-05-26 MED ORDER — PHENOL 1.4 % MT LIQD: 1.0000 | OROMUCOSAL | Status: DC | PRN

## 2023-05-26 MED ORDER — FENTANYL CITRATE PF 50 MCG/ML IJ SOSY
50.0000 ug | PREFILLED_SYRINGE | INTRAMUSCULAR | Status: DC
  Administered 2023-05-26: 100 ug via INTRAVENOUS
  Filled 2023-05-26: qty 2

## 2023-05-26 MED ORDER — PHENYLEPHRINE 80 MCG/ML (10ML) SYRINGE FOR IV PUSH (FOR BLOOD PRESSURE SUPPORT)
PREFILLED_SYRINGE | INTRAVENOUS | Status: DC | PRN
  Administered 2023-05-26 (×2): 80 ug via INTRAVENOUS

## 2023-05-26 MED ORDER — ONDANSETRON HCL 4 MG/2ML IJ SOLN: 4.0000 mg | Freq: Once | INTRAMUSCULAR | Status: DC | PRN

## 2023-05-26 SURGICAL SUPPLY — 55 items
ADH SKN CLS APL DERMABOND .7 (GAUZE/BANDAGES/DRESSINGS) ×1
ATTUNE MED ANAT PAT 32 KNEE (Knees) IMPLANT
BAG COUNTER SPONGE SURGICOUNT (BAG) IMPLANT
BAG SPEC THK2 15X12 ZIP CLS (MISCELLANEOUS)
BAG SPNG CNTER NS LX DISP (BAG)
BAG ZIPLOCK 12X15 (MISCELLANEOUS) IMPLANT
BASEPLATE TIB CMT FB PCKT SZ3 (Knees) IMPLANT
BLADE SAW SGTL 13.0X1.19X90.0M (BLADE) ×1 IMPLANT
BNDG CMPR 6 X 5 YARDS HK CLSR (GAUZE/BANDAGES/DRESSINGS) ×1
BNDG ELASTIC 6INX 5YD STR LF (GAUZE/BANDAGES/DRESSINGS) ×1 IMPLANT
BOWL SMART MIX CTS (DISPOSABLE) ×1 IMPLANT
BSPLAT TIB 3 CMNT FXBRNG STRL (Knees) ×1 IMPLANT
CEMENT HV SMART SET (Cement) ×2 IMPLANT
COMP FEM CMT ATTUNE 3 LT (Joint) ×1 IMPLANT
COMP FEM CMT ATTUNE LT 3 (Joint) ×1 IMPLANT
COMPONENT FEM CMT ATTUNE LT 3 (Joint) IMPLANT
COVER SURGICAL LIGHT HANDLE (MISCELLANEOUS) ×1 IMPLANT
CUFF TOURN SGL QUICK 34 (TOURNIQUET CUFF) ×1
CUFF TRNQT CYL 34X4.125X (TOURNIQUET CUFF) ×1 IMPLANT
DERMABOND ADVANCED .7 DNX12 (GAUZE/BANDAGES/DRESSINGS) ×1 IMPLANT
DRAPE U-SHAPE 47X51 STRL (DRAPES) ×1 IMPLANT
DRESSING AQUACEL AG SP 3.5X10 (GAUZE/BANDAGES/DRESSINGS) ×1 IMPLANT
DRSG AQUACEL AG SP 3.5X10 (GAUZE/BANDAGES/DRESSINGS) ×1
DURAPREP 26ML APPLICATOR (WOUND CARE) ×2 IMPLANT
ELECT REM PT RETURN 15FT ADLT (MISCELLANEOUS) ×1 IMPLANT
GLOVE BIO SURGEON STRL SZ 6 (GLOVE) ×1 IMPLANT
GLOVE BIOGEL PI IND STRL 6.5 (GLOVE) ×1 IMPLANT
GLOVE BIOGEL PI IND STRL 7.5 (GLOVE) ×1 IMPLANT
GLOVE ORTHO TXT STRL SZ7.5 (GLOVE) ×2 IMPLANT
GOWN STRL REUS W/ TWL LRG LVL3 (GOWN DISPOSABLE) ×2 IMPLANT
GOWN STRL REUS W/TWL LRG LVL3 (GOWN DISPOSABLE) ×2
HANDPIECE INTERPULSE COAX TIP (DISPOSABLE) ×1
HOLDER FOLEY CATH W/STRAP (MISCELLANEOUS) IMPLANT
INSERT TIB ATTUNE FXD 3 10 LT (Insert) IMPLANT
KIT TURNOVER KIT A (KITS) IMPLANT
MANIFOLD NEPTUNE II (INSTRUMENTS) ×1 IMPLANT
NDL SAFETY ECLIPSE 18X1.5 (NEEDLE) IMPLANT
NS IRRIG 1000ML POUR BTL (IV SOLUTION) ×1 IMPLANT
PACK TOTAL KNEE CUSTOM (KITS) ×1 IMPLANT
PIN FIX SIGMA LCS THRD HI (PIN) IMPLANT
PROTECTOR NERVE ULNAR (MISCELLANEOUS) ×1 IMPLANT
SET HNDPC FAN SPRY TIP SCT (DISPOSABLE) ×1 IMPLANT
SET PAD KNEE POSITIONER (MISCELLANEOUS) ×1 IMPLANT
SPIKE FLUID TRANSFER (MISCELLANEOUS) ×2 IMPLANT
SUT MNCRL AB 4-0 PS2 18 (SUTURE) ×1 IMPLANT
SUT STRATAFIX PDS+ 0 24IN (SUTURE) ×1 IMPLANT
SUT VIC AB 1 CT1 36 (SUTURE) ×1 IMPLANT
SUT VIC AB 2-0 CT1 27 (SUTURE) ×2
SUT VIC AB 2-0 CT1 TAPERPNT 27 (SUTURE) ×2 IMPLANT
SYR 3ML LL SCALE MARK (SYRINGE) ×1 IMPLANT
TOWEL GREEN STERILE FF (TOWEL DISPOSABLE) ×1 IMPLANT
TRAY FOLEY MTR SLVR 16FR STAT (SET/KITS/TRAYS/PACK) ×1 IMPLANT
TUBE SUCTION HIGH CAP CLEAR NV (SUCTIONS) ×1 IMPLANT
WATER STERILE IRR 1000ML POUR (IV SOLUTION) ×2 IMPLANT
WRAP KNEE MAXI GEL POST OP (GAUZE/BANDAGES/DRESSINGS) ×1 IMPLANT

## 2023-05-26 NOTE — Interval H&P Note (Signed)
History and Physical Interval Note:  05/26/2023 10:51 AM  Doris Ford  has presented today for surgery, with the diagnosis of Left knee osteoarthritis.  The various methods of treatment have been discussed with the patient and family. After consideration of risks, benefits and other options for treatment, the patient has consented to  Procedure(s): TOTAL KNEE ARTHROPLASTY (Left) as a surgical intervention.  The patient's history has been reviewed, patient examined, no change in status, stable for surgery.  I have reviewed the patient's chart and labs.  Questions were answered to the patient's satisfaction.     Shelda Pal

## 2023-05-26 NOTE — Care Plan (Signed)
Ortho Bundle Case Management Note  Patient Details  Name: Doris Ford MRN: [id number] Date of Birth: 03-31-1941                  L TKA on 05/26/23.  DCP: Home with husband and daughter.  DME: No needs. Has RW.  PT: Kevan Ny   DME Arranged:  N/A DME Agency:       Additional Comments: Please contact me with any questions of if this plan should need to change.    Despina Pole, CCM Case Manager, [facility]  [phone] 05/26/2023, 10:35 AM

## 2023-05-26 NOTE — Op Note (Signed)
NAME:  Doris Ford                      MEDICAL RECORD NO.:  [id number]                             FACILITY:  [facility]      PHYSICIAN:  Madlyn Frankel. Charlann Boxer, M.D.  DATE OF BIRTH:  10-16-1940      DATE OF PROCEDURE:  05/26/2023                                     OPERATIVE REPORT         PREOPERATIVE DIAGNOSIS:  Left knee osteoarthritis.      POSTOPERATIVE DIAGNOSIS:  Left knee osteoarthritis.      FINDINGS:  The patient was noted to have complete loss of cartilage and   bone-on-bone arthritis with associated osteophytes in the lateral and patellofemoral compartments of   the knee with a significant synovitis and associated effusion.  The patient had failed months of conservative treatment including medications, injection therapy, activity modification.     PROCEDURE:  Left total knee replacement.      COMPONENTS USED:  DePuy Attune FB CR MS knee   system, a size 3N femur, 3 tibia, size 10 mm CR MS AOX insert, and 32 anatomic patellar   button.      SURGEON:  Madlyn Frankel. Charlann Boxer, M.D.      ASSISTANT:  Rosalene Billings, PA-C.      ANESTHESIA:  Regional and Spinal.      SPECIMENS:  None.      COMPLICATION:  None.      DRAINS:  None.  EBL: about 400      TOURNIQUET TIME:  25 min at 225 mmHg     The patient was stable to the recovery room.      INDICATION FOR PROCEDURE:  ROBERTINE Ford is a 82 y.o. female patient of   mine.  The patient had been seen, evaluated, and treated for months conservatively in the   office with medication, activity modification, and injections.  The patient had   radiographic changes of bone-on-bone arthritis with endplate sclerosis and osteophytes noted.  Based on the radiographic changes and failed conservative measures, the patient   decided to proceed with definitive treatment, total knee replacement.  Risks of infection, DVT, component failure, need for revision surgery, neurovascular injury were reviewed in the office setting.  The postop course  was reviewed stressing the efforts to maximize post-operative satisfaction and function.  Consent was obtained for benefit of pain   relief.      PROCEDURE IN DETAIL:  The patient was brought to the operative theater.   Once adequate anesthesia, preoperative antibiotics, 2 gm of Ancef,1 gm of Tranexamic Acid, and 10 mg of Decadron administered, the patient was positioned supine with a left thigh tourniquet placed.  The  left lower extremity was prepped and draped in sterile fashion.  A time-   out was performed identifying the patient, planned procedure, and the appropriate extremity.      The left lower extremity was placed in the [facility] leg holder.  The leg was   exsanguinated, tourniquet elevated to 225 mmHg.  A midline incision was   made followed by median parapatellar arthrotomy.  Following initial   exposure, attention was  first directed to the patella.  Precut   measurement was noted to be 19 mm.  I resected down to 13 mm and used a   32 anatomic patellar button to restore patellar height as well as cover the cut surface.      The lug holes were drilled and a metal shim was placed to protect the   patella from retractors and saw blade during the procedure.      At this point, attention was now directed to the femur.  The femoral   canal was opened with a drill, irrigated to try to prevent fat emboli.  An   intramedullary rod was passed at 3 degrees valgus, 9 mm of bone was   resected off the distal femur.  Following this resection, the tibia was   subluxated anteriorly.  Using the extramedullary guide, 4 mm of bone was resected off   the proximal medial tibia.  We confirmed the gap would be   stable medially and laterally with a size 5 spacer block as well as confirmed that the tibial cut was perpendicular in the coronal plane, checking with an alignment rod.      Once this was done, I sized the femur to be a size 3 in the anterior-   posterior dimension, chose a narrow component  based on medial and   lateral dimension.  The size 3 rotation block was then pinned in   position anterior referenced using the C-clamp to set rotation.  The   anterior, posterior, and  chamfer cuts were made without difficulty nor   notching making certain that I was along the anterior cortex to help   with flexion gap stability.      The final box cut was made off the lateral aspect of distal femur.      At this point, the tibia was sized to be a size 3.  The size 3 tray was   then pinned in position through the medial third of the tubercle,   drilled, and keel punched.  Trial reduction was now carried with a 3 femur,  3 tibia, a size 10 mm CR PS insert, and the 32 anatomic patella botton.  The knee was brought to full extension with good flexion stability with the patella   tracking through the trochlea without application of pressure.  Given   all these findings the trial components removed.  Final components were   opened and cement was mixed.  The knee was irrigated with normal saline solution and pulse lavage.  The synovial lining was   then injected with 30 cc of 0.25% Marcaine with epinephrine, 1 cc of Toradol and 30 cc of NS for a total of 61 cc.     Final implants were then cemented onto cleaned and dried cut surfaces of bone with the knee brought to extension with a size 10 mm CR MS trial insert.      Once the cement had fully cured, excess cement was removed   throughout the knee.  I confirmed that I was satisfied with the range of   motion and stability, and the final size 10 mm CR MS AOX insert was chosen.  It was   placed into the knee.      The tourniquet had been let down at 25 minutes.  No significant   hemostasis was required.  The extensor mechanism was then reapproximated using #1 Vicryl and #1 Stratafix sutures with the knee   in flexion.  The   remaining wound was closed with 2-0 Vicryl and running 4-0 Monocryl.   The knee was cleaned, dried, dressed sterilely  using Dermabond and   Aquacel dressing.  The patient was then   brought to recovery room in stable condition, tolerating the procedure   well.   Please note that Physician Assistant, Rosalene Billings, PA-C was present for the entirety of the case, and was utilized for pre-operative positioning, peri-operative retractor management, general facilitation of the procedure and for primary wound closure at the end of the case.              Madlyn Frankel Charlann Boxer, M.D.    05/26/2023 10:51 AM

## 2023-05-26 NOTE — Anesthesia Procedure Notes (Signed)
Procedure Name: MAC Date/Time: 05/26/2023 12:13 PM  Performed by: Elyn Peers, CRNAPre-anesthesia Checklist: Patient identified, Emergency Drugs available, Suction available, Patient being monitored and Timeout performed Oxygen Delivery Method: Simple face mask Placement Confirmation: positive ETCO2

## 2023-05-26 NOTE — Anesthesia Preprocedure Evaluation (Signed)
Anesthesia Evaluation  Patient identified by MRN, date of birth, ID band Patient awake    Reviewed: Allergy & Precautions, NPO status , Patient's Chart, lab work & pertinent test results  History of Anesthesia Complications Negative for: history of anesthetic complications  Airway Mallampati: III  TM Distance: >3 FB Neck ROM: Full    Dental  (+) Dental Advisory Given, Teeth Intact, Partial Lower,    Pulmonary neg pulmonary ROS   Pulmonary exam normal        Cardiovascular hypertension, Pt. on medications (-) angina (-) Past MI and (-) CHF  Rhythm:Regular + Systolic murmurs    Neuro/Psych  PSYCHIATRIC DISORDERS Anxiety     Cva 2021  CVA, No Residual Symptoms    GI/Hepatic Neg liver ROS,,,  Endo/Other  negative endocrine ROS    Renal/GU Renal InsufficiencyRenal diseaseLow k  negative genitourinary   Musculoskeletal  (+) Arthritis ,    Abdominal Normal abdominal exam  (+)   Peds  Hematology negative hematology ROS (+) Lab Results      Component                Value               Date                      WBC                      8.5                 11/26/2022                HGB                      14.5                11/26/2022                HCT                      42.8                11/26/2022                MCV                      100.9 (H)           11/26/2022                PLT                      241                 11/26/2022            Plavix last dose 5/6   Anesthesia Other Findings Notes   Will Jorja Loa, MD 05/19/2023  3:43 PM EDT   Abnormal ILR reviewed. Notable for 3.5-second pause on 05/06/2023.  If asymptomatic, continue to monitor..     MyChart Results Release  MyChart Status: Active  Results Release  Conclusion  ILR summary report received. Battery status OK. Normal device function. No new symptom, tachy or, brady episodes. No new AF episodes. 2 Pause events, 1 ECG available to  review from 05/06/23 at 08:56 of 4 sec duration, true pause. First pause event, routed to clinic for review.  Pause alert remains off at this time due to implant indication of cryptogenic stroke; can turn on if clinic requests. Monthly summary reports and ROV/PRN. [facility], [facility]     Reproductive/Obstetrics                             Anesthesia Physical Anesthesia Plan  ASA: 3  Anesthesia Plan: Spinal   Post-op Pain Management: Regional block*   Induction:   PONV Risk Score and Plan: 2 and Propofol infusion, Treatment may vary due to age or medical condition and TIVA  Airway Management Planned: Natural Airway and Simple Face Mask  Additional Equipment: None  Intra-op Plan:   Post-operative Plan:   Informed Consent: I have reviewed the patients History and Physical, chart, labs and discussed the procedure including the risks, benefits and alternatives for the proposed anesthesia with the patient or authorized representative who has indicated his/her understanding and acceptance.       Plan Discussed with: CRNA  Anesthesia Plan Comments:        Anesthesia Quick Evaluation

## 2023-05-26 NOTE — Plan of Care (Signed)
  Problem: Education: Goal: Knowledge of General Education information will improve Description: Including pain rating scale, medication(s)/side effects and non-pharmacologic comfort measures Outcome: Progressing   Problem: Activity: Goal: Risk for activity intolerance will decrease Outcome: Progressing   Problem: Nutrition: Goal: Adequate nutrition will be maintained Outcome: Progressing   Problem: Elimination: Goal: Will not experience complications related to bowel motility Outcome: Progressing   Problem: Pain Management: Goal: General experience of comfort will improve Outcome: Progressing   Problem: Safety: Goal: Ability to remain free from injury will improve Outcome: Progressing   Problem: Education: Goal: Knowledge of the prescribed therapeutic regimen will improve Outcome: Progressing   Problem: Activity: Goal: Ability to avoid complications of mobility impairment will improve Outcome: Progressing   Problem: Pain Management: Goal: Pain level will decrease with appropriate interventions Outcome: Progressing

## 2023-05-26 NOTE — Anesthesia Procedure Notes (Signed)
Anesthesia Regional Block: Adductor canal block   Pre-Anesthetic Checklist: , timeout performed,  Correct Patient, Correct Site, Correct Laterality,  Correct Procedure, Correct Position, site marked,  Risks and benefits discussed,  Surgical consent,  Pre-op evaluation,  At surgeon's request and post-op pain management  Laterality: Lower and Left  Prep: chloraprep       Needles:  Injection technique: Single-shot  Needle Type: Echogenic Stimulator Needle     Needle Length: 9cm  Needle Gauge: 20   Needle insertion depth: 2 cm   Additional Needles:   Procedures:,,,, ultrasound used (permanent image in chart),,    Narrative:  Start time: 05/26/2023 11:52 AM End time: 05/26/2023 12:00 PM Injection made incrementally with aspirations every 5 mL.  Performed by: Personally  Anesthesiologist: Leilani Able, MD

## 2023-05-26 NOTE — H&P (Signed)
TOTAL KNEE ADMISSION H&P  Patient is being admitted for left total knee arthroplasty.  Therapy Plans: outpatient therapy at [facility] Disposition: Home with husband and daughter Planned DVT Prophylaxis: Plavix DME needed: none PCP: Dr. Margo Aye - clearance received Cardio: Francis Dowse - clearance received (RBBB, old , loop recorder) TXA: IV Allergies: NKDA Anesthesia Concerns: none BMI: 19.5 Last HgbA1c: Not diabetic   Other: - Recent blood in stool > had colonoscopy last week - no concerns, likely diverticular - No hx of VTE - oxycodone, robaxin, tylenol  Subjective:  Chief Complaint:left knee pain.  HPI: Doris Ford, 82 y.o. female, has a history of pain and functional disability in the left knee due to arthritis and has failed non-surgical conservative treatments for greater than 12 weeks to includeNSAID's and/or analgesics, corticosteriod injections, and activity modification.  Onset of symptoms was gradual, starting 2 years ago with gradually worsening course since that time. The patient noted no past surgery on the left knee(s).  Patient currently rates pain in the left knee(s) at 8 out of 10 with activity. Patient has worsening of pain with activity and weight bearing and pain that interferes with activities of daily living.  Patient has evidence of joint space narrowing by imaging studies.  There is no active infection.  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   Palpitations    Hyperlipidemia    Cerebrovascular accident (CVA) (HCC)    Slurred speech 01/01/2020   Personal history of COVID-19 08/12/2019   GI bleed 05/31/2019   Gastric ulcer with hemorrhage 05/31/2019   HTN (hypertension) 05/31/2019   Arthritis of right hip 12/16/2013   Status post THR (total hip replacement) 12/16/2013   Chronic  interstitial cystitis 11/26/2011   Past Medical History:  Diagnosis Date   Arthritis    Breast cancer (HCC) 06/2006   right breast/tx with lumpectomy and tamoxifen   COVID-19    Gastric ulcer    no NSAIDs   Hypertension    under control   Interstitial cystitis    Osteopenia    no change   Stroke (HCC) 2021    Past Surgical History:  Procedure Laterality Date   BILATERAL SALPINGOOPHORECTOMY  age 48s   BIOPSY  05/31/2019   Procedure: BIOPSY;  Surgeon: Corbin Ade, MD;  Location: [facility];  Service: Endoscopy;;   BREAST SURGERY Right    lumpectomy-multiple times   BUNIONECTOMY WITH HAMMERTOE RECONSTRUCTION Bilateral ~2000   CATARACT EXTRACTION Bilateral 2007   COLONOSCOPY WITH PROPOFOL N/A 05/06/2023   Procedure: COLONOSCOPY WITH PROPOFOL;  Surgeon: Corbin Ade, MD;  Location: [facility];  Service: Endoscopy;  Laterality: N/A;  930am, asa 3   ESOPHAGOGASTRODUODENOSCOPY N/A 05/31/2019   Dr. Jena Gauss: widely patent Schatzki ring, non-bleeding cratered ulcer with adherent clot injected/thermally sealed. motled gastric mucosa with benign biopsy negative for H.pylori. plans for 3 month surveillance EGD.   ESOPHAGOGASTRODUODENOSCOPY N/A 10/05/2019   Procedure: ESOPHAGOGASTRODUODENOSCOPY (EGD);  Surgeon: Corbin Ade, MD;  Location: [facility];  Service: Endoscopy;  Laterality: N/A;  2:00pm - moved up per office   KNEE SURGERY Bilateral as teen   LOOP RECORDER INSERTION N/A 01/04/2020   Procedure: LOOP RECORDER INSERTION;  Surgeon: Regan Lemming, MD;  Location: [facility];  Service: Cardiovascular;  Laterality: N/A;   POLYPECTOMY  05/06/2023  Procedure: POLYPECTOMY;  Surgeon: Corbin Ade, MD;  Location: [facility];  Service: Endoscopy;;   TONSILLECTOMY AND ADENOIDECTOMY  age 75   TOTAL HIP ARTHROPLASTY Right 12/16/2013   Procedure: RIGHT TOTAL HIP ARTHROPLASTY ANTERIOR APPROACH;  Surgeon: Kathryne Hitch, MD;  Location: [facility];  Service:  Orthopedics;  Laterality: Right;   TOTAL KNEE ARTHROPLASTY Right 12/09/2022   Procedure: TOTAL KNEE ARTHROPLASTY;  Surgeon: Durene Romans, MD;  Location: [facility];  Service: Orthopedics;  Laterality: Right;   TOTAL VAGINAL HYSTERECTOMY  age 14    No current facility-administered medications for this encounter.   Current Outpatient Medications  Medication Sig Dispense Refill Last Dose   acetaminophen (TYLENOL) 650 MG CR tablet Take 650-1,300 mg by mouth 2 (two) times daily as needed for pain.      amLODipine (NORVASC) 5 MG tablet Take 5 mg by mouth every evening.      Ascorbic Acid (VITAMIN C) 1000 MG tablet Take 1,000 mg by mouth daily.      atorvastatin (LIPITOR) 40 MG tablet Take 1 tablet (40 mg total) by mouth daily. 30 tablet 1    Calcium-Vitamin D (CALTRATE 600 PLUS-VIT D PO) Take 1 tablet by mouth daily.       Cholecalciferol (VITAMIN D3) 50 MCG (2000 UT) TABS Take 2,000 Units by mouth daily.      clopidogrel (PLAVIX) 75 MG tablet Take 1 tablet (75 mg total) by mouth daily. 30 tablet 1    hydrochlorothiazide (HYDRODIURIL) 12.5 MG tablet Take 12.5 mg by mouth daily.      Polyethyl Glyc-Propyl Glyc PF (SYSTANE HYDRATION PF) 0.4-0.3 % SOLN Place 1 drop into both eyes daily as needed (dry eyes).      polyethylene glycol (MIRALAX / GLYCOLAX) 17 g packet Take 17 g by mouth daily as needed for mild constipation. 14 each 0    potassium chloride SA (KLOR-CON M) 20 MEQ tablet Take 1 tablet (20 mEq total) by mouth daily for 14 days. 14 tablet 0    sertraline (ZOLOFT) 50 MG tablet Take 1.5 tablets (75 mg total) by mouth daily. 45 tablet 5    fluticasone (FLONASE) 50 MCG/ACT nasal spray Place 2 sprays into both nostrils daily. (Patient not taking: Reported on 04/28/2023) 16 g 12 Not Taking   Sod Picosulfate-Mag Ox-Cit Acd (CLENPIQ) 10-3.5-12 MG-GM -GM/175ML SOLN Take 1 kit by mouth as directed. 350 mL 0    Allergies  Allergen Reactions   Codeine Nausea Only    Severe nausea.  Dizziness.   Macrobid  [Nitrofurantoin] Other (See Comments)    Skin eruption    Social History   Tobacco Use   Smoking status: Never   Smokeless tobacco: Never  Substance Use Topics   Alcohol use: No    Family History  Problem Relation Age of Onset   Cancer Mother        breast/mastectomy   Heart disease Mother        poor circulation   Deep vein thrombosis Mother    Osteoporosis Mother    Hypertension Father    Colon cancer Maternal Aunt    Sleep apnea Neg Hx      Review of Systems  Constitutional:  Negative for chills and fever.  Respiratory:  Negative for cough and shortness of breath.   Cardiovascular:  Negative for chest pain.  Gastrointestinal:  Negative for nausea and vomiting.  Musculoskeletal:  Positive for arthralgias.     Objective:  Physical Exam Well nourished and well developed.  General: Alert and oriented x3, cooperative and pleasant, no acute distress. Head: normocephalic, atraumatic, neck supple. Eyes: EOMI.  Musculoskeletal: Bilateral knee exams: No palpable effusions, warmth erythema Bilateral genu valgum noted Slight flexion contractures Passively correctable valgus with pain Flexion of 120 degrees with crepitation and tightness Tenderness over the lateral and anterior aspect of the joint No significant lower extremity edema, erythema or calf tenderness  Calves soft and nontender. Motor function intact in LE. Strength 5/5 LE bilaterally. Neuro: Distal pulses 2+. Sensation to light touch intact in LE.  Vital signs in last 24 hours:    Labs:   Estimated body mass index is 18.68 kg/m as calculated from the following:   Height as of 05/21/23: 5\' 4"  (1.626 m).   Weight as of 05/21/23: 49.4 kg.   Imaging Review Plain radiographs demonstrate severe degenerative joint disease of the left knee(s). The overall alignment isneutral. The bone quality appears to be adequate for age and reported activity level.      Assessment/Plan:  End stage arthritis, left  knee   The patient history, physical examination, clinical judgment of the provider and imaging studies are consistent with end stage degenerative joint disease of the left knee(s) and total knee arthroplasty is deemed medically necessary. The treatment options including medical management, injection therapy arthroscopy and arthroplasty were discussed at length. The risks and benefits of total knee arthroplasty were presented and reviewed. The risks due to aseptic loosening, infection, stiffness, patella tracking problems, thromboembolic complications and other imponderables were discussed. The patient acknowledged the explanation, agreed to proceed with the plan and consent was signed. Patient is being admitted for inpatient treatment for surgery, pain control, PT, OT, prophylactic antibiotics, VTE prophylaxis, progressive ambulation and ADL's and discharge planning. The patient is planning to be discharged  home.     Patient's anticipated LOS is less than 2 midnights, meeting these requirements: - Younger than 81 - Lives within 1 hour of care - Has a competent adult at home to recover with post-op recover - NO history of  - Chronic pain requiring opiods  - Diabetes  - Coronary Artery Disease  - Heart failure  - Heart attack  - Stroke  - DVT/VTE  - Cardiac arrhythmia  - Respiratory Failure/COPD  - Renal failure  - Anemia  - Advanced Liver disease  Rosalene Billings, PA-C Orthopedic Surgery [facility] [place] [phone]

## 2023-05-26 NOTE — Addendum Note (Signed)
Addendum  created 05/26/23 1541 by Leilani Able, MD   Child order released for a procedure order, Clinical Note Signed, Intraprocedure Blocks edited, Intraprocedure Meds edited, SmartForm saved

## 2023-05-26 NOTE — Anesthesia Procedure Notes (Signed)
Spinal  Patient location during procedure: OR Start time: 05/26/2023 12:15 PM End time: 05/26/2023 12:20 PM Reason for block: surgical anesthesia Staffing Performed: anesthesiologist  Anesthesiologist: Leilani Able, MD Performed by: Leilani Able, MD Authorized by: Leilani Able, MD   Preanesthetic Checklist Completed: patient identified, IV checked, site marked, risks and benefits discussed, surgical consent, monitors and equipment checked, pre-op evaluation and timeout performed Spinal Block Patient position: sitting Prep: DuraPrep and site prepped and draped Patient monitoring: continuous pulse ox and blood pressure Approach: midline Location: L3-4 Injection technique: single-shot Needle Needle type: Pencan  Needle gauge: 24 G Needle length: 10 cm Needle insertion depth: 5 cm Assessment Sensory level: T8 Events: CSF return

## 2023-05-26 NOTE — Anesthesia Postprocedure Evaluation (Signed)
Anesthesia Post Note  Patient: Doris Ford  Procedure(s) Performed: TOTAL KNEE ARTHROPLASTY (Left: Knee)     Patient location during evaluation: PACU Anesthesia Type: Spinal Level of consciousness: awake Pain management: pain level controlled Vital Signs Assessment: post-procedure vital signs reviewed and stable Respiratory status: spontaneous breathing Cardiovascular status: stable Postop Assessment: no headache, no backache, spinal receding, patient able to bend at knees and no apparent nausea or vomiting Anesthetic complications: no  No notable events documented.  Last Vitals:  Vitals:   05/26/23 1428 05/26/23 1430  BP:  (!) 96/47  Pulse: 68 63  Resp: 14 13  Temp:    SpO2: 93% 90%    Last Pain:  Vitals:   05/26/23 1430  TempSrc:   PainSc: 0-No pain                 Caren Macadam

## 2023-05-26 NOTE — Transfer of Care (Signed)
Immediate Anesthesia Transfer of Care Note  Patient: Doris Ford  Procedure(s) Performed: TOTAL KNEE ARTHROPLASTY (Left: Knee)  Patient Location: PACU  Anesthesia Type:Spinal and MAC combined with regional for post-op pain  Level of Consciousness: awake and responds to stimulation  Airway & Oxygen Therapy: Patient Spontanous Breathing and Patient connected to face mask oxygen  Post-op Assessment: Report given to RN and Post -op Vital signs reviewed and stable  Post vital signs: Reviewed and stable  Last Vitals:  Vitals Value Taken Time  BP 99/50 05/26/23 1352  Temp    Pulse 52 05/26/23 1354  Resp 14 05/26/23 1354  SpO2 100 % 05/26/23 1354  Vitals shown include unfiled device data.  Last Pain:  Vitals:   05/26/23 1205  TempSrc:   PainSc: Asleep      Patients Stated Pain Goal: 4 (05/26/23 1014)  Complications: No notable events documented.

## 2023-05-26 NOTE — Discharge Instructions (Signed)

## 2023-05-27 ENCOUNTER — Encounter (HOSPITAL_COMMUNITY): Payer: Self-pay | Admitting: Orthopedic Surgery

## 2023-05-27 DIAGNOSIS — M1712 Unilateral primary osteoarthritis, left knee: Secondary | ICD-10-CM | POA: Diagnosis not present

## 2023-05-27 LAB — CBC
HCT: 34.1 % — ABNORMAL LOW (ref 36.0–46.0)
Hemoglobin: 11.4 g/dL — ABNORMAL LOW (ref 12.0–15.0)
MCH: 34.4 pg — ABNORMAL HIGH (ref 26.0–34.0)
MCHC: 33.4 g/dL (ref 30.0–36.0)
MCV: 103 fL — ABNORMAL HIGH (ref 80.0–100.0)
Platelets: 187 10*3/uL (ref 150–400)
RBC: 3.31 MIL/uL — ABNORMAL LOW (ref 3.87–5.11)
RDW: 13 % (ref 11.5–15.5)
WBC: 11.8 10*3/uL — ABNORMAL HIGH (ref 4.0–10.5)
nRBC: 0 % (ref 0.0–0.2)

## 2023-05-27 LAB — BASIC METABOLIC PANEL
Anion gap: 9 (ref 5–15)
BUN: 26 mg/dL — ABNORMAL HIGH (ref 8–23)
CO2: 27 mmol/L (ref 22–32)
Calcium: 8.7 mg/dL — ABNORMAL LOW (ref 8.9–10.3)
Chloride: 100 mmol/L (ref 98–111)
Creatinine, Ser: 0.4 mg/dL — ABNORMAL LOW (ref 0.44–1.00)
GFR, Estimated: >60 mL/min (ref >60)
Glucose, Bld: 177 mg/dL — ABNORMAL HIGH (ref 70–99)
Potassium: 3.6 mmol/L (ref 3.5–5.1)
Sodium: 136 mmol/L (ref 135–145)

## 2023-05-27 MED ORDER — OXYCODONE HCL 5 MG PO TABS: 5.0000 mg | ORAL_TABLET | ORAL | 0 refills | Status: DC | PRN

## 2023-05-27 MED ORDER — LACTATED RINGERS IV BOLUS
250.0000 mL | Freq: Once | INTRAVENOUS | Status: AC
  Administered 2023-05-27: 250 mL via INTRAVENOUS

## 2023-05-27 MED ORDER — SENNA 8.6 MG PO TABS: 2.0000 | ORAL_TABLET | Freq: Every day | ORAL | 0 refills | Status: AC

## 2023-05-27 MED ORDER — METHOCARBAMOL 500 MG PO TABS: 500.0000 mg | ORAL_TABLET | Freq: Four times a day (QID) | ORAL | 2 refills | Status: DC | PRN

## 2023-05-27 NOTE — TOC Transition Note (Signed)
Transition of Care [facility]) - CM/SW Discharge Note   Patient Details  Name: Doris Ford MRN: [id number] Date of Birth: 1940-11-23  [facility] of [facility]) CM/SW Contact:  Amada Jupiter, LCSW Phone Number: 05/27/2023, 10:26 AM   Clinical Narrative:    Met with pt who confirms she has all needed DME in the home.  OPPT already arranged with [facility].  No further TOC needs.   Final next level of care: OP Rehab Barriers to Discharge: No Barriers Identified   Patient Goals and CMS Choice      Discharge Placement                         Discharge Plan and Services Additional resources added to the After Visit Summary for                  DME Arranged: N/A                    Social Determinants of Health (SDOH) Interventions SDOH Screenings   Food Insecurity: No Food Insecurity (05/26/2023)  Housing: Low Risk  (05/26/2023)  Transportation Needs: No Transportation Needs (05/26/2023)  Utilities: Not At Risk (05/26/2023)  Depression (PHQ2-9): Medium Risk (01/22/2021)  Tobacco Use: Low Risk  (05/26/2023)     Readmission Risk Interventions     No data to display

## 2023-05-27 NOTE — Progress Notes (Addendum)
   Subjective: 1 Day Post-Op Procedure(s) (LRB): TOTAL KNEE ARTHROPLASTY (Left) Patient reports pain as mild.   Patient seen in rounds with Dr. Charlann Boxer. Patient is well, and has had no acute complaints or problems. No acute events overnight. Foley catheter removed. Patient has not been up with PT yet.  We will start therapy today.   Objective: Vital signs in last 24 hours: Temp:  [97.1 F (36.2 C)-98.3 F (36.8 C)] 97.6 F (36.4 C) (10/30 0611) Pulse Rate:  [58-69] 58 (10/30 0611) Resp:  [11-20] 17 (10/30 0611) BP: (89-163)/(43-89) 109/57 (10/30 0611) SpO2:  [90 %-100 %] 95 % (10/30 0611) Weight:  [49.4 kg] 49.4 kg (10/29 1014)  Intake/Output from previous day:  Intake/Output Summary (Last 24 hours) at 05/27/2023 0749 Last data filed at 05/27/2023 0620 Gross per 24 hour  Intake 1767.67 ml  Output 1670 ml  Net 97.67 ml     Intake/Output this shift: No intake/output data recorded.  Labs: Recent Labs    05/27/23 0340  HGB 11.4*   Recent Labs    05/27/23 0340  WBC 11.8*  RBC 3.31*  HCT 34.1*  PLT 187   Recent Labs    05/27/23 0340  NA 136  K 3.6  CL 100  CO2 27  BUN 26*  CREATININE 0.40*  GLUCOSE 177*  CALCIUM 8.7*   No results for input(s): "LABPT", "INR" in the last 72 hours.  Exam: General - Patient is Alert and Oriented Extremity - Neurologically intact Sensation intact distally Intact pulses distally Dorsiflexion/Plantar flexion intact Dressing - dressing C/D/I Motor Function - intact, moving foot and toes well on exam.   Past Medical History:  Diagnosis Date   Arthritis    Breast cancer (HCC) 06/2006   right breast/tx with lumpectomy and tamoxifen   COVID-19    Gastric ulcer    no NSAIDs   Hypertension    under control   Interstitial cystitis    Osteopenia    no change   Stroke (HCC) 2021    Assessment/Plan: 1 Day Post-Op Procedure(s) (LRB): TOTAL KNEE ARTHROPLASTY (Left) Principal Problem:   S/P total knee arthroplasty,  left  Estimated body mass index is 18.68 kg/m as calculated from the following:   Height as of this encounter: 5\' 4"  (1.626 m).   Weight as of this encounter: 49.4 kg. Advance diet Up with therapy D/C IV fluids   Patient's anticipated LOS is less than 2 midnights, meeting these requirements: - Younger than 49 - Lives within 1 hour of care - Has a competent adult at home to recover with post-op recover - NO history of  - Chronic pain requiring opiods  - Diabetes  - Coronary Artery Disease  - Heart failure  - Heart attack  - Stroke  - DVT/VTE  - Cardiac arrhythmia  - Respiratory Failure/COPD  - Renal failure  - Anemia  - Advanced Liver disease     DVT Prophylaxis - Plavix Weight bearing as tolerated.  Hgb stable at 11.4 this AM.  Plan is to go Home after hospital stay. Plan for discharge today following 1-2 sessions of PT as long as they are meeting their goals. Patient is scheduled for OPPT. Follow up in the office in 2 weeks.   Rosalene Billings, PA-C Orthopedic Surgery [phone] 05/27/2023, 7:49 AM

## 2023-05-27 NOTE — Progress Notes (Signed)
Physical Therapy Treatment Patient Details Name: Doris Ford MRN: [id number] DOB: [birth year] Today's Date: 05/27/2023   History of Present Illness Pt is 82 yo female admitted on 05/26/23 for L TKA.  Pt with hx including R TKA 5/24, arthritis, breast CA with lumpectomy    PT Comments  Pt is POD # 1 and is progressing well.  She has excellent pain control, ROM, and quad activation.  Pt very familiar with rehab progress from prior R TKA and demonstrates good understanding of HEP.  She ambulated 150' safely and performed stairs similar to home set up.  Pt demonstrates safe gait & transfers in order to return home from PT perspective once discharged by MD.  While in hospital, will continue to benefit from PT for skilled therapy to advance mobility and exercises.       If plan is discharge home, recommend the following: A little help with walking and/or transfers;A little help with bathing/dressing/bathroom;Assistance with cooking/housework;Help with stairs or ramp for entrance   Can travel by private vehicle        Equipment Recommendations  None recommended by PT    Recommendations for Other Services       Precautions / Restrictions Precautions Precautions: Fall;Knee Restrictions Weight Bearing Restrictions: Yes LLE Weight Bearing: Weight bearing as tolerated     Mobility  Bed Mobility Overal bed mobility: Needs Assistance Bed Mobility: Sit to Supine     Supine to sit: Supervision Sit to supine: Supervision        Transfers Overall transfer level: Needs assistance Equipment used: Rolling walker (2 wheels) Transfers: Sit to/from Stand Sit to Stand: Supervision           General transfer comment: From chair x 2 and from toilet; performed safely    Ambulation/Gait Ambulation/Gait assistance: Supervision Gait Distance (Feet): 150 Feet Assistive device: Rolling walker (2 wheels) Gait Pattern/deviations: Step-through pattern Gait velocity: decreased but  functional     General Gait Details: slight decrease in weight shift to L; cues for RW proximty with good carryover   Stairs Stairs: Yes Stairs assistance: Contact guard assist Stair Management: Two rails, Step to pattern, Forwards Number of Stairs: 3 General stair comments: Pt recalled correct sequencing; demonstrated without difficulty   Wheelchair Mobility     Tilt Bed    Modified Rankin (Stroke Patients Only)       Balance Overall balance assessment: Needs assistance Sitting-balance support: No upper extremity supported Sitting balance-Leahy Scale: Good     Standing balance support: Bilateral upper extremity supported Standing balance-Leahy Scale: Fair Standing balance comment: RW to ambulate - steady; could stand for ADLs without UE support                            Cognition Arousal: Alert Behavior During Therapy: WFL for tasks assessed/performed Overall Cognitive Status: Within Functional Limits for tasks assessed                                          Exercises Total Joint Exercises Ankle Circles/Pumps: AROM, Both, 20 reps, Seated Quad Sets: AROM, Both, 10 reps, Seated Heel Slides: AROM, Left, 5 reps, Supine Hip ABduction/ADduction: AROM, Left, 5 reps, Supine Long Arc Quad: AROM, Seated, 10 reps, Left Knee Flexion: AROM, Left, 10 reps, Seated Goniometric ROM: L knee 5 to 100 General Exercises - Lower Extremity Hip  Flexion/Marching: AROM, Both, 20 reps, Seated Other Exercises Other Exercises: Pt tolerating AROM well without increased pain.  Had her perform 2x10 above LE exercises and 2x10 bil shoulder press at EOB to assist in blood flow/increasing BP    General Comments   Educated on safe ice use, no pivots, car transfers, resting with leg straight, and TED hose during day. Also, encouraged walking every 1-2 hours during day. Educated on HEP with focus on mobility the first weeks. Discussed doing exercises within pain  control and if pain increasing could decreased ROM, reps, and stop exercises as needed. Encouraged to perform quad sets and ankle pumps frequently for blood flow and to promote full knee extension.      Pertinent Vitals/Pain Pain Assessment Pain Assessment: 0-10 Pain Score: 1  Pain Location: L knee Pain Descriptors / Indicators: Discomfort Pain Intervention(s): Limited activity within patient's tolerance, Monitored during session, Premedicated before session, Repositioned    Home Living Family/patient expects to be discharged to:: Private residence Living Arrangements: Spouse/significant other Available Help at Discharge: Family;Available 24 hours/day (spouse 24 hr; dtr in and out daily) Type of Home: House Home Access: Stairs to enter Entrance Stairs-Rails: Doctor, general practice of Steps: 2   Home Layout: One level Home Equipment: Cane - Programmer, applications (2 wheels);Shower seat      Prior Function            PT Goals (current goals can now be found in the care plan section) Acute Rehab PT Goals Patient Stated Goal: return home PT Goal Formulation: With patient Time For Goal Achievement: 06/11/23 Potential to Achieve Goals: Good Progress towards PT goals: Progressing toward goals    Frequency    7X/week      PT Plan      Co-evaluation              AM-PAC PT "6 Clicks" Mobility   Outcome Measure  Help needed turning from your back to your side while in a flat bed without using bedrails?: None Help needed moving from lying on your back to sitting on the side of a flat bed without using bedrails?: None Help needed moving to and from a bed to a chair (including a wheelchair)?: A Little Help needed standing up from a chair using your arms (e.g., wheelchair or bedside chair)?: A Little Help needed to walk in hospital room?: A Little Help needed climbing 3-5 steps with a railing? : A Little 6 Click Score: 20    End of Session Equipment Utilized  During Treatment: Gait belt Activity Tolerance: Patient tolerated treatment well Patient left: with call bell/phone within reach;in bed;with family/visitor present Nurse Communication: Mobility status PT Visit Diagnosis: Other abnormalities of gait and mobility (R26.89);Muscle weakness (generalized) (M62.81)     Time: 4098-1191 PT Time Calculation (min) (ACUTE ONLY): 34 min  Charges:    $Gait Training: 8-22 mins $Therapeutic Exercise: 8-22 mins PT General Charges $$ ACUTE PT VISIT: 1 Visit                     Anise Salvo, PT Acute Rehab Services [facility] [phone]    Rayetta Humphrey 05/27/2023, 1:39 PM

## 2023-05-27 NOTE — Care Management Obs Status (Signed)
MEDICARE OBSERVATION STATUS NOTIFICATION   Patient Details  Name: Doris Ford MRN: [id number] Date of Birth: September 18, 1940   Medicare Observation Status Notification Given:  Hart Robinsons, LCSW 05/27/2023, 10:51 AM

## 2023-05-27 NOTE — Evaluation (Signed)
Physical Therapy Evaluation Patient Details Name: Doris Ford MRN: [id number] DOB: 12-May-1941 Today's Date: 05/27/2023  History of Present Illness  Pt is 82 yo female admitted on 05/26/23 for L TKA.  Pt with hx including R TKA 5/24, arthritis, breast CA with lumpectomy  Clinical Impression  Pt is s/p TKA resulting in the deficits listed below (see PT Problem List). At baseline, pt is independent with use of quad cane due to L Knee pain.  She has support and DME at d/c.  Today, pt with soft BP but was asymptomatic - did allow increased time for transfers and AROM exercises with position changes.  She had excellent ROM, pain control, and quad activation. She was able to ambulate 20' with CGA for safety.  Held on stairs due to soft BP and pt to get bolus.  Will f/u in afternoon - pt expected to progress well. Pt will benefit from acute skilled PT to increase their independence and safety with mobility to allow discharge.      BP as follows:  Supine 102/42 and HR 68 Sit 86/39 HR 70 - asymptomatic Sitting 3 mins with AROM exercises: 105/53 and HR 66 Standing 121/51 with HR 76 (did have pt weight shifting/not locking knees) Post walk in chair 113/53 with HR 69      If plan is discharge home, recommend the following: A little help with walking and/or transfers;A little help with bathing/dressing/bathroom;Assistance with cooking/housework;Help with stairs or ramp for entrance   Can travel by private vehicle        Equipment Recommendations None recommended by PT  Recommendations for Other Services       Functional Status Assessment Patient has had a recent decline in their functional status and demonstrates the ability to make significant improvements in function in a reasonable and predictable amount of time.     Precautions / Restrictions Precautions Precautions: Fall;Knee Restrictions Weight Bearing Restrictions: Yes LLE Weight Bearing: Weight bearing as tolerated       Mobility  Bed Mobility Overal bed mobility: Needs Assistance Bed Mobility: Supine to Sit     Supine to sit: Supervision          Transfers Overall transfer level: Needs assistance Equipment used: Rolling walker (2 wheels) Transfers: Sit to/from Stand Sit to Stand: Contact guard assist           General transfer comment: CGA for safety    Ambulation/Gait Ambulation/Gait assistance: Contact guard assist Gait Distance (Feet): 60 Feet Assistive device: Rolling walker (2 wheels) Gait Pattern/deviations: Step-through pattern, Decreased stride length Gait velocity: decreased but functionalS     General Gait Details: slight decrease in weight shift to L; cues for sequencing initially  Stairs            Wheelchair Mobility     Tilt Bed    Modified Rankin (Stroke Patients Only)       Balance Overall balance assessment: Needs assistance Sitting-balance support: No upper extremity supported Sitting balance-Leahy Scale: Good     Standing balance support: Bilateral upper extremity supported Standing balance-Leahy Scale: Poor Standing balance comment: steady with RW                             Pertinent Vitals/Pain Pain Assessment Pain Assessment: 0-10 Pain Score: 1  Pain Location: L knee Pain Descriptors / Indicators: Discomfort Pain Intervention(s): Limited activity within patient's tolerance, Monitored during session, Premedicated before session, Repositioned    Home  Living Family/patient expects to be discharged to:: Private residence Living Arrangements: Spouse/significant other Available Help at Discharge: Family;Available 24 hours/day (spouse 24 hr; dtr in and out daily) Type of Home: House Home Access: Stairs to enter Entrance Stairs-Rails: Doctor, general practice of Steps: 2   Home Layout: One level Home Equipment: Cane - Programmer, applications (2 wheels);Shower seat      Prior Function Prior Level of Function :  Independent/Modified Independent;Driving             Mobility Comments: Ambulates with quad cane; ambulation limited to short community distance due to L Knee pain ADLs Comments: Independent with adls and iadls     Extremity/Trunk Assessment   Upper Extremity Assessment Upper Extremity Assessment: Overall WFL for tasks assessed    Lower Extremity Assessment Lower Extremity Assessment: LLE deficits/detail;RLE deficits/detail RLE Deficits / Details: ROM WFL; MMT 5/5 LLE Deficits / Details: Expected post op changes; ROM: knee 5 to 90 degrees; MMT: ankle 5/5, knee and hip 3/5 not further tested    Cervical / Trunk Assessment Cervical / Trunk Assessment: Normal  Communication      Cognition Arousal: Alert Behavior During Therapy: WFL for tasks assessed/performed Overall Cognitive Status: Within Functional Limits for tasks assessed                                          General Comments      Exercises Total Joint Exercises Ankle Circles/Pumps: AROM, Both, 20 reps, Seated Long Arc Quad: AROM, Both, 20 reps, Seated Knee Flexion: AROM, Left, 10 reps, Seated General Exercises - Lower Extremity Hip Flexion/Marching: AROM, Both, 20 reps, Seated Other Exercises Other Exercises: Pt tolerating AROM well without increased pain.  Had her perform 2x10 above LE exercises and 2x10 bil shoulder press at EOB to assist in blood flow/increasing BP   Assessment/Plan    PT Assessment Patient needs continued PT services  PT Problem List Decreased strength;Pain;Decreased range of motion;Decreased activity tolerance;Decreased balance;Decreased mobility;Decreased knowledge of use of DME       PT Treatment Interventions Therapeutic exercise;Gait training;DME instruction;Balance training;Stair training;Functional mobility training;Therapeutic activities;Patient/family education;Modalities    PT Goals (Current goals can be found in the Care Plan section)  Acute Rehab PT  Goals Patient Stated Goal: return home PT Goal Formulation: With patient Time For Goal Achievement: 06/11/23 Potential to Achieve Goals: Good    Frequency 7X/week     Co-evaluation               AM-PAC PT "6 Clicks" Mobility  Outcome Measure Help needed turning from your back to your side while in a flat bed without using bedrails?: A Little Help needed moving from lying on your back to sitting on the side of a flat bed without using bedrails?: A Little Help needed moving to and from a bed to a chair (including a wheelchair)?: A Little Help needed standing up from a chair using your arms (e.g., wheelchair or bedside chair)?: A Little Help needed to walk in hospital room?: A Little Help needed climbing 3-5 steps with a railing? : A Little 6 Click Score: 18    End of Session Equipment Utilized During Treatment: Gait belt Activity Tolerance: Patient tolerated treatment well Patient left: with chair alarm set;in chair;with call bell/phone within reach Nurse Communication: Mobility status PT Visit Diagnosis: Other abnormalities of gait and mobility (R26.89);Muscle weakness (generalized) (M62.81)  Time: 1002-1030 PT Time Calculation (min) (ACUTE ONLY): 28 min   Charges:   PT Evaluation $PT Eval Low Complexity: 1 Low PT Treatments $Gait Training: 8-22 mins PT General Charges $$ ACUTE PT VISIT: 1 Visit         Anise Salvo, PT Acute Rehab Services [facility] [phone]   Rayetta Humphrey 05/27/2023, 10:39 AM

## 2023-06-03 DIAGNOSIS — M25562 Pain in left knee: Secondary | ICD-10-CM | POA: Diagnosis not present

## 2023-06-03 DIAGNOSIS — M25662 Stiffness of left knee, not elsewhere classified: Secondary | ICD-10-CM | POA: Diagnosis not present

## 2023-06-04 NOTE — Progress Notes (Signed)
Carelink Summary Report / Loop Recorder 

## 2023-06-05 DIAGNOSIS — M25662 Stiffness of left knee, not elsewhere classified: Secondary | ICD-10-CM | POA: Diagnosis not present

## 2023-06-05 DIAGNOSIS — M25562 Pain in left knee: Secondary | ICD-10-CM | POA: Diagnosis not present

## 2023-06-08 DIAGNOSIS — M25562 Pain in left knee: Secondary | ICD-10-CM | POA: Diagnosis not present

## 2023-06-08 DIAGNOSIS — M25662 Stiffness of left knee, not elsewhere classified: Secondary | ICD-10-CM | POA: Diagnosis not present

## 2023-06-08 NOTE — Discharge Summary (Signed)
Patient ID: Doris Ford MRN: [id number] DOB/AGE: [birth year] 82 y.o.  Admit date: 05/26/2023 Discharge date: 05/27/2023  Admission Diagnoses:  Left knee osteoarthritis  Discharge Diagnoses:  Principal Problem:   S/P total knee arthroplasty, left   Past Medical History:  Diagnosis Date   Arthritis    Breast cancer (HCC) 06/2006   right breast/tx with lumpectomy and tamoxifen   COVID-19    Gastric ulcer    no NSAIDs   Hypertension    under control   Interstitial cystitis    Osteopenia    no change   Stroke (HCC) 2021    Surgeries: Procedure(s): TOTAL KNEE ARTHROPLASTY on 05/26/2023   Consultants:   Discharged Condition: Improved  Hospital Course: CRIMSON KALICH is an 82 y.o. female who was admitted 05/26/2023 for operative treatment ofS/P total knee arthroplasty, left. Patient has severe unremitting pain that affects sleep, daily activities, and work/hobbies. After pre-op clearance the patient was taken to the operating room on 05/26/2023 and underwent  Procedure(s): TOTAL KNEE ARTHROPLASTY.    Patient was given perioperative antibiotics:  Anti-infectives (From admission, onward)    Start     Dose/Rate Route Frequency Ordered Stop   05/26/23 1800  ceFAZolin (ANCEF) IVPB 2g/100 mL premix        2 g 200 mL/hr over 30 Minutes Intravenous Every 6 hours 05/26/23 1646 05/27/23 0011   05/26/23 1000  ceFAZolin (ANCEF) IVPB 2g/100 mL premix        2 g 200 mL/hr over 30 Minutes Intravenous On call to O.R. 05/26/23 0959 05/26/23 1236        Patient was given sequential compression devices, early ambulation, and chemoprophylaxis to prevent DVT. Patient worked with PT and was meeting their goals regarding safe ambulation and transfers.  Patient benefited maximally from hospital stay and there were no complications.    Recent vital signs: No data found.   Recent laboratory studies: No results for input(s): "WBC", "HGB", "HCT", "PLT", "NA", "K", "CL", "CO2", "BUN",  "CREATININE", "GLUCOSE", "INR", "CALCIUM" in the last 72 hours.  Invalid input(s): "PT", "2"   Discharge Medications:   Allergies as of 05/27/2023       Reactions   Codeine Nausea Only   Severe nausea.  Dizziness.   Macrobid [nitrofurantoin] Other (See Comments)   Skin eruption        Medication List     TAKE these medications    acetaminophen 650 MG CR tablet Commonly known as: TYLENOL Take 650-1,300 mg by mouth 2 (two) times daily as needed for pain.   amLODipine 5 MG tablet Commonly known as: NORVASC Take 5 mg by mouth every evening.   atorvastatin 40 MG tablet Commonly known as: LIPITOR Take 1 tablet (40 mg total) by mouth daily.   CALTRATE 600 PLUS-VIT D PO Take 1 tablet by mouth daily.   Clenpiq 10-3.5-12 MG-GM -GM/175ML Soln Generic drug: Sod Picosulfate-Mag Ox-Cit Acd Take 1 kit by mouth as directed.   clopidogrel 75 MG tablet Commonly known as: PLAVIX Take 1 tablet (75 mg total) by mouth daily.   fluticasone 50 MCG/ACT nasal spray Commonly known as: FLONASE Place 2 sprays into both nostrils daily.   hydrochlorothiazide 12.5 MG tablet Commonly known as: HYDRODIURIL Take 12.5 mg by mouth daily.   methocarbamol 500 MG tablet Commonly known as: ROBAXIN Take 1 tablet (500 mg total) by mouth every 6 (six) hours as needed for muscle spasms.   oxyCODONE 5 MG immediate release tablet Commonly known as: Oxy IR/ROXICODONE Take  1 tablet (5 mg total) by mouth every 4 (four) hours as needed for severe pain (pain score 7-10).   polyethylene glycol 17 g packet Commonly known as: MIRALAX / GLYCOLAX Take 17 g by mouth daily as needed for mild constipation.   potassium chloride SA 20 MEQ tablet Commonly known as: KLOR-CON M Take 1 tablet (20 mEq total) by mouth daily for 14 days.   senna 8.6 MG Tabs tablet Commonly known as: SENOKOT Take 2 tablets (17.2 mg total) by mouth at bedtime for 14 days.   sertraline 50 MG tablet Commonly known as: Zoloft Take  1.5 tablets (75 mg total) by mouth daily.   Systane Hydration PF 0.4-0.3 % Soln Generic drug: Polyethyl Glyc-Propyl Glyc PF Place 1 drop into both eyes daily as needed (dry eyes).   vitamin C 1000 MG tablet Take 1,000 mg by mouth daily.   Vitamin D3 50 MCG (2000 UT) Tabs Take 2,000 Units by mouth daily.               Discharge Care Instructions  (From admission, onward)           Start     Ordered   05/27/23 0000  Change dressing       Comments: Maintain surgical dressing until follow up in the clinic. If the edges start to pull up, may reinforce with tape. If the dressing is no longer working, may remove and cover with gauze and tape, but must keep the area dry and clean.  Call with any questions or concerns.   05/27/23 1517            Diagnostic Studies: CUP PACEART REMOTE DEVICE CHECK  Result Date: 05/19/2023 ILR summary report received. Battery status OK. Normal device function. No new symptom, tachy or, brady episodes. No new AF episodes. 2 Pause events, 1 ECG available to review from 05/06/23 at 08:56 of 4 sec duration, true pause. First pause event, routed to clinic for review. Pause alert remains off at this time due to implant indication of cryptogenic stroke; can turn on if clinic requests. Monthly summary reports and ROV/PRN. [facility], [facility]   Disposition: Discharge disposition: 01-Home or Self Care       Discharge Instructions     Call MD / Call 911   Complete by: As directed    If you experience chest pain or shortness of breath, CALL 911 and be transported to the hospital emergency room.  If you develope a fever above 101 F, pus (white drainage) or increased drainage or redness at the wound, or calf pain, call your surgeon's office.   Change dressing   Complete by: As directed    Maintain surgical dressing until follow up in the clinic. If the edges start to pull up, may reinforce with tape. If the dressing is no longer working, may remove and cover  with gauze and tape, but must keep the area dry and clean.  Call with any questions or concerns.   Constipation Prevention   Complete by: As directed    Drink plenty of fluids.  Prune juice may be helpful.  You may use a stool softener, such as Colace (over the counter) 100 mg twice a day.  Use MiraLax (over the counter) for constipation as needed.   Diet - low sodium heart healthy   Complete by: As directed    Increase activity slowly as tolerated   Complete by: As directed    Weight bearing as tolerated with assist device (  walker, cane, etc) as directed, use it as long as suggested by your surgeon or therapist, typically at least 4-6 weeks.   Post-operative opioid taper instructions:   Complete by: As directed    POST-OPERATIVE OPIOID TAPER INSTRUCTIONS: It is important to wean off of your opioid medication as soon as possible. If you do not need pain medication after your surgery it is ok to stop day one. Opioids include: Codeine, Hydrocodone(Norco, Vicodin), Oxycodone(Percocet, oxycontin) and hydromorphone amongst others.  Long term and even short term use of opiods can cause: Increased pain response Dependence Constipation Depression Respiratory depression And more.  Withdrawal symptoms can include Flu like symptoms Nausea, vomiting And more Techniques to manage these symptoms Hydrate well Eat regular healthy meals Stay active Use relaxation techniques(deep breathing, meditating, yoga) Do Not substitute Alcohol to help with tapering If you have been on opioids for less than two weeks and do not have pain than it is ok to stop all together.  Plan to wean off of opioids This plan should start within one week post op of your joint replacement. Maintain the same interval or time between taking each dose and first decrease the dose.  Cut the total daily intake of opioids by one tablet each day Next start to increase the time between doses. The last dose that should be eliminated  is the evening dose.      TED hose   Complete by: As directed    Use stockings (TED hose) for 2 weeks on both leg(s).  You may remove them at night for sleeping.        Follow-up Information     Cassandria Anger, PA-C. Go on 06/10/2023.   Specialty: Orthopedic Surgery Why: You are scheduled for first post op appt on Wednesday November 13 at 1:45pm. Contact information: [address] [place] [address] [phone]         Zorita Pang.. Go on 05/29/2023.   Why: You are scheduled for physical therapy evaluation on Friday November 1 at 1:00pm. Contact information: [address] [place] [address] [phone]                  Signed: Cassandria Anger 06/08/2023, 3:58 PM

## 2023-06-12 DIAGNOSIS — M25562 Pain in left knee: Secondary | ICD-10-CM | POA: Diagnosis not present

## 2023-06-12 DIAGNOSIS — M25662 Stiffness of left knee, not elsewhere classified: Secondary | ICD-10-CM | POA: Diagnosis not present

## 2023-06-15 DIAGNOSIS — M25662 Stiffness of left knee, not elsewhere classified: Secondary | ICD-10-CM | POA: Diagnosis not present

## 2023-06-15 DIAGNOSIS — M25562 Pain in left knee: Secondary | ICD-10-CM | POA: Diagnosis not present

## 2023-06-17 DIAGNOSIS — M25662 Stiffness of left knee, not elsewhere classified: Secondary | ICD-10-CM | POA: Diagnosis not present

## 2023-06-17 DIAGNOSIS — M25562 Pain in left knee: Secondary | ICD-10-CM | POA: Diagnosis not present

## 2023-06-19 DIAGNOSIS — M25662 Stiffness of left knee, not elsewhere classified: Secondary | ICD-10-CM | POA: Diagnosis not present

## 2023-06-19 DIAGNOSIS — M25562 Pain in left knee: Secondary | ICD-10-CM | POA: Diagnosis not present

## 2023-06-22 ENCOUNTER — Ambulatory Visit (INDEPENDENT_AMBULATORY_CARE_PROVIDER_SITE_OTHER): Payer: Medicare HMO

## 2023-06-22 DIAGNOSIS — I639 Cerebral infarction, unspecified: Secondary | ICD-10-CM | POA: Diagnosis not present

## 2023-06-22 LAB — CUP PACEART REMOTE DEVICE CHECK
Date Time Interrogation Session: 20241122230101
Implantable Pulse Generator Implant Date: 20210609

## 2023-06-23 ENCOUNTER — Telehealth: Payer: Self-pay

## 2023-06-23 DIAGNOSIS — M25662 Stiffness of left knee, not elsewhere classified: Secondary | ICD-10-CM | POA: Diagnosis not present

## 2023-06-23 DIAGNOSIS — M25562 Pain in left knee: Secondary | ICD-10-CM | POA: Diagnosis not present

## 2023-06-23 NOTE — Telephone Encounter (Signed)
Alert received from [employer] for new AF episodes, Cardiac compass c/w up to 45 min of AF per day, not on OAC per Epic, cannot exclude true AF with implant indication of Cryptogenic stroke, routed to Triage for review.   Called patient to send manual transmission to obtain EGM.  Per Osvaldo Human, CMA [employer] system is down and unable to process remote transmissions at this time. Unknown ETA.   Pt advised once we hear we can receive transmissions, someone will f/u wth pt. Pt appreciative.

## 2023-06-24 NOTE — Telephone Encounter (Signed)
Contacted Del with [employer] to inquire about no EGM's for ILR including carelink.

## 2023-06-24 NOTE — Telephone Encounter (Signed)
Del called [employer] tech services who advised the ILR 1 now has the software ILR2 has (AI, artifical intelligence) and if no EMG's present AI has deemed the AF was false. Continue to monitor.

## 2023-06-29 ENCOUNTER — Telehealth: Payer: Self-pay

## 2023-06-29 DIAGNOSIS — M25662 Stiffness of left knee, not elsewhere classified: Secondary | ICD-10-CM | POA: Diagnosis not present

## 2023-06-29 DIAGNOSIS — M25562 Pain in left knee: Secondary | ICD-10-CM | POA: Diagnosis not present

## 2023-06-29 NOTE — Telephone Encounter (Signed)
Alert received from [employer]:  ILR alert for RRT reached 12/1

## 2023-06-29 NOTE — Telephone Encounter (Signed)
Outreach made to Pt.  Left message requesting call back.  ILR reached RRT:  06/28/2023  Patient called, discussed options to leave device in or explanted.  Patient would like to ____________________.  Marked "I" in Paceart: done Enter note in Paceart:  done Canceled future remotes: done Discontinued from website: done Entered in Speciality Comments: done  Advised if further questions arise to please call the device clinic at [phone].

## 2023-06-30 NOTE — Telephone Encounter (Signed)
Spoke with pt, she wants to keep the ILR in since its not bothering her. Pt was told to unplug her monitor as she no longer needs it.

## 2023-07-02 DIAGNOSIS — M25562 Pain in left knee: Secondary | ICD-10-CM | POA: Diagnosis not present

## 2023-07-02 DIAGNOSIS — M25662 Stiffness of left knee, not elsewhere classified: Secondary | ICD-10-CM | POA: Diagnosis not present

## 2023-07-06 DIAGNOSIS — M25562 Pain in left knee: Secondary | ICD-10-CM | POA: Diagnosis not present

## 2023-07-06 DIAGNOSIS — M25662 Stiffness of left knee, not elsewhere classified: Secondary | ICD-10-CM | POA: Diagnosis not present

## 2023-07-09 DIAGNOSIS — M25562 Pain in left knee: Secondary | ICD-10-CM | POA: Diagnosis not present

## 2023-07-09 DIAGNOSIS — M25662 Stiffness of left knee, not elsewhere classified: Secondary | ICD-10-CM | POA: Diagnosis not present

## 2023-07-13 DIAGNOSIS — M25662 Stiffness of left knee, not elsewhere classified: Secondary | ICD-10-CM | POA: Diagnosis not present

## 2023-07-13 DIAGNOSIS — M25562 Pain in left knee: Secondary | ICD-10-CM | POA: Diagnosis not present

## 2023-07-16 DIAGNOSIS — Z471 Aftercare following joint replacement surgery: Secondary | ICD-10-CM | POA: Diagnosis not present

## 2023-07-16 DIAGNOSIS — Z96652 Presence of left artificial knee joint: Secondary | ICD-10-CM | POA: Diagnosis not present

## 2023-07-20 DIAGNOSIS — I10 Essential (primary) hypertension: Secondary | ICD-10-CM | POA: Diagnosis not present

## 2023-07-20 DIAGNOSIS — R7301 Impaired fasting glucose: Secondary | ICD-10-CM | POA: Diagnosis not present

## 2023-07-20 NOTE — Progress Notes (Signed)
Carelink Summary Report / Loop Recorder 

## 2023-07-24 DIAGNOSIS — F419 Anxiety disorder, unspecified: Secondary | ICD-10-CM | POA: Diagnosis not present

## 2023-07-24 DIAGNOSIS — I10 Essential (primary) hypertension: Secondary | ICD-10-CM | POA: Diagnosis not present

## 2023-07-24 DIAGNOSIS — N3281 Overactive bladder: Secondary | ICD-10-CM | POA: Diagnosis not present

## 2023-07-24 DIAGNOSIS — E876 Hypokalemia: Secondary | ICD-10-CM | POA: Diagnosis not present

## 2023-07-24 DIAGNOSIS — M170 Bilateral primary osteoarthritis of knee: Secondary | ICD-10-CM | POA: Diagnosis not present

## 2023-07-24 DIAGNOSIS — M545 Low back pain, unspecified: Secondary | ICD-10-CM | POA: Diagnosis not present

## 2023-07-24 DIAGNOSIS — Z8673 Personal history of transient ischemic attack (TIA), and cerebral infarction without residual deficits: Secondary | ICD-10-CM | POA: Diagnosis not present

## 2023-07-24 DIAGNOSIS — K2210 Ulcer of esophagus without bleeding: Secondary | ICD-10-CM | POA: Diagnosis not present

## 2023-07-24 DIAGNOSIS — Z853 Personal history of malignant neoplasm of breast: Secondary | ICD-10-CM | POA: Diagnosis not present

## 2023-07-24 DIAGNOSIS — R008 Other abnormalities of heart beat: Secondary | ICD-10-CM | POA: Diagnosis not present

## 2023-07-24 DIAGNOSIS — G4730 Sleep apnea, unspecified: Secondary | ICD-10-CM | POA: Diagnosis not present

## 2023-07-24 DIAGNOSIS — R7301 Impaired fasting glucose: Secondary | ICD-10-CM | POA: Diagnosis not present

## 2023-07-27 ENCOUNTER — Ambulatory Visit: Payer: Medicare HMO

## 2023-08-07 ENCOUNTER — Other Ambulatory Visit (HOSPITAL_COMMUNITY): Payer: Self-pay | Admitting: Nurse Practitioner

## 2023-08-07 DIAGNOSIS — Z1231 Encounter for screening mammogram for malignant neoplasm of breast: Secondary | ICD-10-CM

## 2023-08-07 DIAGNOSIS — R7301 Impaired fasting glucose: Secondary | ICD-10-CM

## 2023-08-19 ENCOUNTER — Ambulatory Visit (HOSPITAL_COMMUNITY)
Admission: RE | Admit: 2023-08-19 | Discharge: 2023-08-19 | Disposition: A | Discharge status: Home / Self Care | Payer: Medicare HMO | Source: Ambulatory Visit | Attending: Nurse Practitioner | Admitting: Nurse Practitioner

## 2023-08-19 DIAGNOSIS — R7301 Impaired fasting glucose: Secondary | ICD-10-CM

## 2023-08-19 DIAGNOSIS — M8589 Other specified disorders of bone density and structure, multiple sites: Secondary | ICD-10-CM | POA: Diagnosis not present

## 2023-08-19 DIAGNOSIS — Z1231 Encounter for screening mammogram for malignant neoplasm of breast: Secondary | ICD-10-CM | POA: Insufficient documentation

## 2023-08-19 DIAGNOSIS — Z780 Asymptomatic menopausal state: Secondary | ICD-10-CM | POA: Diagnosis not present

## 2023-08-26 ENCOUNTER — Other Ambulatory Visit (HOSPITAL_COMMUNITY): Payer: Self-pay | Admitting: Orthopedic Surgery

## 2023-08-26 DIAGNOSIS — M79662 Pain in left lower leg: Secondary | ICD-10-CM

## 2023-08-27 ENCOUNTER — Ambulatory Visit (HOSPITAL_COMMUNITY)
Admission: RE | Admit: 2023-08-27 | Discharge: 2023-08-27 | Disposition: A | Discharge status: Home / Self Care | Payer: Medicare HMO | Source: Ambulatory Visit | Attending: Orthopedic Surgery | Admitting: Orthopedic Surgery

## 2023-08-27 DIAGNOSIS — M79605 Pain in left leg: Secondary | ICD-10-CM | POA: Diagnosis not present

## 2023-08-27 DIAGNOSIS — M79662 Pain in left lower leg: Secondary | ICD-10-CM | POA: Insufficient documentation

## 2023-08-31 ENCOUNTER — Ambulatory Visit: Payer: Medicare HMO

## 2023-09-23 DIAGNOSIS — J101 Influenza due to other identified influenza virus with other respiratory manifestations: Secondary | ICD-10-CM | POA: Diagnosis not present

## 2023-09-23 DIAGNOSIS — Z20822 Contact with and (suspected) exposure to covid-19: Secondary | ICD-10-CM | POA: Diagnosis not present

## 2023-09-23 DIAGNOSIS — J09X2 Influenza due to identified novel influenza A virus with other respiratory manifestations: Secondary | ICD-10-CM | POA: Diagnosis not present

## 2023-10-05 ENCOUNTER — Ambulatory Visit: Payer: Medicare HMO

## 2023-10-28 DIAGNOSIS — R7301 Impaired fasting glucose: Secondary | ICD-10-CM | POA: Diagnosis not present

## 2023-10-28 DIAGNOSIS — D511 Vitamin B12 deficiency anemia due to selective vitamin B12 malabsorption with proteinuria: Secondary | ICD-10-CM | POA: Diagnosis not present

## 2023-10-28 DIAGNOSIS — E559 Vitamin D deficiency, unspecified: Secondary | ICD-10-CM | POA: Diagnosis not present

## 2023-10-28 DIAGNOSIS — I10 Essential (primary) hypertension: Secondary | ICD-10-CM | POA: Diagnosis not present

## 2023-10-30 ENCOUNTER — Ambulatory Visit: Admitting: Urology

## 2023-11-03 DIAGNOSIS — R7301 Impaired fasting glucose: Secondary | ICD-10-CM | POA: Diagnosis not present

## 2023-11-03 DIAGNOSIS — M545 Low back pain, unspecified: Secondary | ICD-10-CM | POA: Diagnosis not present

## 2023-11-03 DIAGNOSIS — E876 Hypokalemia: Secondary | ICD-10-CM | POA: Diagnosis not present

## 2023-11-03 DIAGNOSIS — Z8673 Personal history of transient ischemic attack (TIA), and cerebral infarction without residual deficits: Secondary | ICD-10-CM | POA: Diagnosis not present

## 2023-11-03 DIAGNOSIS — R008 Other abnormalities of heart beat: Secondary | ICD-10-CM | POA: Diagnosis not present

## 2023-11-03 DIAGNOSIS — M810 Age-related osteoporosis without current pathological fracture: Secondary | ICD-10-CM | POA: Diagnosis not present

## 2023-11-03 DIAGNOSIS — I10 Essential (primary) hypertension: Secondary | ICD-10-CM | POA: Diagnosis not present

## 2023-11-03 DIAGNOSIS — M170 Bilateral primary osteoarthritis of knee: Secondary | ICD-10-CM | POA: Diagnosis not present

## 2023-11-03 DIAGNOSIS — N3281 Overactive bladder: Secondary | ICD-10-CM | POA: Diagnosis not present

## 2023-11-03 DIAGNOSIS — G4730 Sleep apnea, unspecified: Secondary | ICD-10-CM | POA: Diagnosis not present

## 2023-11-03 DIAGNOSIS — F419 Anxiety disorder, unspecified: Secondary | ICD-10-CM | POA: Diagnosis not present

## 2023-11-03 DIAGNOSIS — K2210 Ulcer of esophagus without bleeding: Secondary | ICD-10-CM | POA: Diagnosis not present

## 2023-11-09 ENCOUNTER — Ambulatory Visit: Payer: Medicare HMO

## 2023-11-24 DIAGNOSIS — H532 Diplopia: Secondary | ICD-10-CM | POA: Diagnosis not present

## 2023-11-24 DIAGNOSIS — H40013 Open angle with borderline findings, low risk, bilateral: Secondary | ICD-10-CM | POA: Diagnosis not present

## 2023-11-24 DIAGNOSIS — H5203 Hypermetropia, bilateral: Secondary | ICD-10-CM | POA: Diagnosis not present

## 2023-12-01 DIAGNOSIS — R350 Frequency of micturition: Secondary | ICD-10-CM | POA: Insufficient documentation

## 2023-12-01 DIAGNOSIS — N952 Postmenopausal atrophic vaginitis: Secondary | ICD-10-CM | POA: Insufficient documentation

## 2023-12-01 DIAGNOSIS — R3915 Urgency of urination: Secondary | ICD-10-CM | POA: Insufficient documentation

## 2023-12-01 DIAGNOSIS — Z853 Personal history of malignant neoplasm of breast: Secondary | ICD-10-CM | POA: Insufficient documentation

## 2023-12-01 NOTE — Progress Notes (Unsigned)
 Name: Doris Ford DOB: [birth year] MRN: [id number]  History of Present Illness: Doris Ford is a 83 y.o. female who presents today as a new patient at [facility] [place]. All available relevant medical records have been reviewed.  GU History includes: 1. Interstitial cystitis.  - Previously followed at [facility]; last visit there was 12/12/2014.  - Prior treatments included Elmiron, Hydroxyzine, Claritin , Cysta-Q. 2. OAB with urinary frequency, nocturia, urgency, and urge incontinence.  3. Vaginal atrophy.  - Previously used Vagifem  10 mcg tablets.  Today: She denies bladder pain.  She reports bothersome urinary frequency, nocturia x2-3, urgency, and urge incontinence. Leaking several times per day; wears 2-3 pads per day on average and also 2-3 pull ups overnight.   She denies dysuria, gross hematuria, straining to void, or sensations of incomplete emptying. She denies flank pain or abdominal pain.  Denies significant caffeine intake (just occasional).   Her PCP previously prescribed Myrbetriq 25 mg daily but she was unable to start that due to cost. Gemtesa 75 mg daily was then prescribed as an alternative however that was also cost prohibitive.    Medications: Current Outpatient Medications  Medication Sig Dispense Refill   acetaminophen  (TYLENOL ) 650 MG CR tablet Take 650-1,300 mg by mouth 2 (two) times daily as needed for pain.     amLODipine  (NORVASC ) 5 MG tablet Take 5 mg by mouth every evening.     Ascorbic Acid  (VITAMIN C ) 1000 MG tablet Take 1,000 mg by mouth daily.     atorvastatin  (LIPITOR) 40 MG tablet Take 1 tablet (40 mg total) by mouth daily. 30 tablet 1   Calcium -Vitamin D (CALTRATE 600 PLUS-VIT D PO) Take 1 tablet by mouth daily.      Cholecalciferol  (VITAMIN D3) 50 MCG (2000 UT) TABS Take 2,000 Units by mouth daily.     clopidogrel  (PLAVIX ) 75 MG tablet Take 1 tablet (75 mg total) by mouth daily. 30 tablet 1   estradiol   (ESTRACE ) 0.1 MG/GM vaginal cream Discard plastic applicator. Insert a blueberry size amount (approximately 1 gram) of cream on fingertip inside vagina at bedtime every night for 1 week then every other night. For long term use. 30 g 3   hydrochlorothiazide  (HYDRODIURIL ) 12.5 MG tablet Take 12.5 mg by mouth daily.     Polyethyl Glyc-Propyl Glyc PF (SYSTANE HYDRATION PF) 0.4-0.3 % SOLN Place 1 drop into both eyes daily as needed (dry eyes).     polyethylene glycol (MIRALAX  / GLYCOLAX ) 17 g packet Take 17 g by mouth daily as needed for mild constipation. 14 each 0   sertraline  (ZOLOFT ) 50 MG tablet Take 1.5 tablets (75 mg total) by mouth daily. 45 tablet 5   Vibegron (GEMTESA) 75 MG TABS Take 1 tablet (75 mg total) by mouth daily.     Vibegron (GEMTESA) 75 MG TABS Take 1 tablet (75 mg total) by mouth daily. 30 tablet 11   potassium chloride  SA (KLOR-CON  M) 20 MEQ tablet Take 1 tablet (20 mEq total) by mouth daily for 14 days. 14 tablet 0   No current facility-administered medications for this visit.    Allergies: Allergies  Allergen Reactions   Codeine Nausea Only    Severe nausea.  Dizziness.   Macrobid  [Nitrofurantoin ] Other (See Comments)    Skin eruption    Past Medical History:  Diagnosis Date   Arthritis    Breast cancer (HCC) 06/2006   right breast/tx with lumpectomy and tamoxifen   COVID-19  Gastric ulcer    no NSAIDs   Hypertension    under control   Interstitial cystitis    Osteopenia    no change   Stroke [facility]) 2021   Past Surgical History:  Procedure Laterality Date   BILATERAL SALPINGOOPHORECTOMY  age 26s   BIOPSY  05/31/2019   Procedure: BIOPSY;  Surgeon: Suzette Espy, MD;  Location: [facility];  Service: Endoscopy;;   BREAST SURGERY Right    lumpectomy-multiple times   BUNIONECTOMY WITH HAMMERTOE RECONSTRUCTION Bilateral ~2000   CATARACT EXTRACTION Bilateral 2007   COLONOSCOPY WITH PROPOFOL  N/A 05/06/2023   Procedure: COLONOSCOPY WITH PROPOFOL ;   Surgeon: Suzette Espy, MD;  Location: [facility];  Service: Endoscopy;  Laterality: N/A;  930am, asa 3   ESOPHAGOGASTRODUODENOSCOPY N/A 05/31/2019   Dr. Riley Cheadle: widely patent Schatzki ring, non-bleeding cratered ulcer with adherent clot injected/thermally sealed. motled gastric mucosa with benign biopsy negative for H.pylori. plans for 3 month surveillance EGD.   ESOPHAGOGASTRODUODENOSCOPY N/A 10/05/2019   Procedure: ESOPHAGOGASTRODUODENOSCOPY (EGD);  Surgeon: Suzette Espy, MD;  Location: [facility];  Service: Endoscopy;  Laterality: N/A;  2:00pm - moved up per office   KNEE SURGERY Bilateral as teen   LOOP RECORDER INSERTION N/A 01/04/2020   Procedure: LOOP RECORDER INSERTION;  Surgeon: Lei Pump, MD;  Location: [facility];  Service: Cardiovascular;  Laterality: N/A;   POLYPECTOMY  05/06/2023   Procedure: POLYPECTOMY;  Surgeon: Suzette Espy, MD;  Location: [facility];  Service: Endoscopy;;   TONSILLECTOMY AND ADENOIDECTOMY  age 26   TOTAL HIP ARTHROPLASTY Right 12/16/2013   Procedure: RIGHT TOTAL HIP ARTHROPLASTY ANTERIOR APPROACH;  Surgeon: Arnie Lao, MD;  Location: [facility];  Service: Orthopedics;  Laterality: Right;   TOTAL KNEE ARTHROPLASTY Right 12/09/2022   Procedure: TOTAL KNEE ARTHROPLASTY;  Surgeon: Claiborne Crew, MD;  Location: [facility];  Service: Orthopedics;  Laterality: Right;   TOTAL KNEE ARTHROPLASTY Left 05/26/2023   Procedure: TOTAL KNEE ARTHROPLASTY;  Surgeon: Claiborne Crew, MD;  Location: [facility];  Service: Orthopedics;  Laterality: Left;   TOTAL VAGINAL HYSTERECTOMY  age 69   Family History  Problem Relation Age of Onset   Cancer Mother        breast/mastectomy   Heart disease Mother        poor circulation   Deep vein thrombosis Mother    Osteoporosis Mother    Hypertension Father    Colon cancer Maternal Aunt    Sleep apnea Neg Hx    Social History   Socioeconomic History   Marital status: Married    Spouse name: Not on  file   Number of children: Not on file   Years of education: Not on file   Highest education level: Not on file  Occupational History   Not on file  Tobacco Use   Smoking status: Never   Smokeless tobacco: Never  Vaping Use   Vaping status: Never Used  Substance and Sexual Activity   Alcohol  use: No   Drug use: No   Sexual activity: Not Currently    Partners: Male    Birth control/protection: Surgical    Comment: [facility]  Other Topics Concern   Not on file  Social History Narrative   Not on file   Social Drivers of Health   Financial Resource Strain: Not on file  Food Insecurity: No Food Insecurity (05/26/2023)   Hunger Vital Sign    Worried About Running Out of Food in  the Last Year: Never true    Ran Out of Food in the Last Year: Never true  Transportation Needs: No Transportation Needs (05/26/2023)   PRAPARE - Administrator, Civil Service (Medical): No    Lack of Transportation (Non-Medical): No  Physical Activity: Not on file  Stress: Not on file  Social Connections: Not on file  Intimate Partner Violence: Not At Risk (05/26/2023)   Humiliation, Afraid, Rape, and Kick questionnaire    Fear of Current or Ex-Partner: No    Emotionally Abused: No    Physically Abused: No    Sexually Abused: No    SUBJECTIVE  Review of Systems Constitutional: Patient denies any unintentional weight loss or change in strength lntegumentary: Patient denies any rashes or pruritus Cardiovascular: Patient denies chest pain or syncope Respiratory: Patient denies shortness of breath Eyes: Reports chronic dry eyes  Gastrointestinal: Denies constipation  Musculoskeletal: Patient denies muscle cramps or weakness Neurologic: Patient denies convulsions or seizures Allergic/Immunologic: Patient denies recent allergic reaction(s) Hematologic/Lymphatic: Patient denies bleeding tendencies Endocrine: Patient denies heat/cold intolerance  GU: As per HPI.  OBJECTIVE Vitals:    12/02/23 1012  BP: 135/72  Pulse: 77   There is no height or weight on file to calculate BMI.  Physical Examination Constitutional: No obvious distress; patient is non-toxic appearing  Cardiovascular: No visible lower extremity edema.  Respiratory: The patient does not have audible wheezing/stridor; respirations do not appear labored  Gastrointestinal: Abdomen non-distended Musculoskeletal: Normal ROM of UEs  Skin: No obvious rashes/open sores  Neurologic: CN 2-12 grossly intact Psychiatric: Answered questions appropriately with normal affect  Hematologic/Lymphatic/Immunologic: No obvious bruises or sites of spontaneous bleeding  Urine microscopy: 6-10 WBC/hpf, 3-10 RBC/hpf, few bacteria PVR: 0 ml  ASSESSMENT Chronic interstitial cystitis - Plan: Urinalysis, Routine w reflex microscopic, BLADDER SCAN AMB NON-IMAGING  Urinary urgency - Plan: Urinalysis, Routine w reflex microscopic, BLADDER SCAN AMB NON-IMAGING, Vibegron (GEMTESA) 75 MG TABS, Vibegron (GEMTESA) 75 MG TABS, estradiol  (ESTRACE ) 0.1 MG/GM vaginal cream  Urinary frequency - Plan: Urinalysis, Routine w reflex microscopic, BLADDER SCAN AMB NON-IMAGING, Vibegron (GEMTESA) 75 MG TABS, Vibegron (GEMTESA) 75 MG TABS, estradiol  (ESTRACE ) 0.1 MG/GM vaginal cream  Atrophic vaginitis - Plan: Urinalysis, Routine w reflex microscopic, BLADDER SCAN AMB NON-IMAGING, estradiol  (ESTRACE ) 0.1 MG/GM vaginal cream  Dry eye  Urge incontinence of urine - Plan: Vibegron (GEMTESA) 75 MG TABS, Vibegron (GEMTESA) 75 MG TABS, estradiol  (ESTRACE ) 0.1 MG/GM vaginal cream  UA today appears abnormal however patient is asymptomatic for UTI, therefore acute antibiotic treatment is not advised at this time. The rationale for this was discussed with the patient. We discussed antibiotic stewardship and goal to minimize risk for developing antibiotic resistance.  Patient was advised that if/when UTI-like symptoms occur they can call our office to speak  with a nurse, who can place an order for urinalysis (with reflex to urine culture). They may then proceed to a [facility] to provide their urine sample. This is required for evaluation in order for their Urology provider to make informed treatment recommendation(s), which may or may not include an antibiotic prescription.  We discussed the symptoms of overactive bladder (OAB), which include urinary urgency, frequency, nocturia, with or without urge incontinence.  While we may not know the exact etiology of OAB, several risk factors can be identified.  - Patient's neurogenic risk factors: scoliosis, prior stroke. - Exacerbating factors: diuretic use (hydrochlorothiazide ), OSA (not on CPAP).  We discussed the following management options in detail including  potential benefits, risks, and side effects: Behavioral therapy: Modify fluid intake Minimize / avoid bladder irritants (such as caffeine, spicy foods, acidic foods, alcohol ) Bladder retraining / timed voiding Double voiding Medication(s): - Not a safe candidate for anticholinergic medications due to risk for side effects based on patient's age, comorbidities (including prior stroke), and pre-existing dry eyes.  - Beta-3 agonist medications: We discussed potential side effects of beta-3 agonist medications such as urinary retention and (infrequently) elevated blood pressure.   She decided to proceed with trial of Gemtesa (Vibegron) 75 mg daily; samples provided and refills sent to pharmacy. Also agreed to restart topical vaginal estrogen to address her vaginal atrophy, which may be contributing to her OAB symptoms via urethral irritation.   We agreed to plan for follow up in 8 weeks or sooner if needed. Patient verbalized understanding of and agreement with current plan. All questions were answered.  PLAN Advised the following: Gemtesa (Vibegron) 75 mg daily.  Minimize caffeine intake. Work on timed voiding / bladder  retraining. Start topical vaginal estrogen cream as prescribed. Return in about 8 weeks (around 01/27/2024) for UA, PVR, & f/u with Griselda Lederer NP.  Orders Placed This Encounter  Procedures   Urinalysis, Routine w reflex microscopic   BLADDER SCAN AMB NON-IMAGING    It has been explained that the patient is to follow regularly with their PCP in addition to all other providers involved in their care and to follow instructions provided by these respective offices. Patient advised to contact urology clinic if any urologic-pertaining questions, concerns, new symptoms or problems arise in the interim period.  Patient Instructions      Overactive bladder (OAB) overview for patients:  Symptoms may include: urinary urgency ("gotta go" feeling) urinary frequency (voiding >8 times per day) night time urination (nocturia) urge incontinence of urine (UUI)  While we do not know the exact etiology of OAB, several treatment options exist including:  Behavioral therapy: Reducing fluid intake Decreasing bladder stimulants (such as caffeine) and irritants (such as acidic food, spicy foods, alcohol ) Urge suppression strategies Bladder retraining via timed voiding  Pelvic floor physical therapy  Medication(s) - can use one or both of the drug classes below. Anticholinergic / antimuscarinic medications:  Mechanism of action: Activate M3 receptors to reduce detrusor stimulation and increase bladder capacity   (parasympathetic nervous system). Effect: Relaxes the bladder to decrease overactivity, increase bladder storage capacity, and increase time between voids. Onset: Slow acting (may take 8-12 weeks to determine efficacy). Medications include: Vesicare (Solifenacin), Ditropan (Oxybutynin), Detrol (Tolterodine), Toviaz (Fesoterodine), Sanctura (Trospium), Urispas (Flavoxate), Enablex (Darifenacin), Bentyl (Dicyclomine), Levsin (Hyoscyamine ). Potential side effects include but are not limited  to: Dry eyes, dry mouth, constipation, cognitive impairment, dementia risk with long term use, and urinary retention/ incomplete bladder emptying. Insurance companies generally prefer for patients to try 1-2 anticholinergic / antimuscarinic medications first due to low cost. Some exceptions are made based on patient-specific comorbidities / risk factors. Beta-3 agonist medications: Mechanism of action: Stimulates selective B3 adrenergic receptors to cause smooth muscle bladder relaxation (sympathetic nervous system). Effect: Relaxes the bladder to decrease overactivity, increase bladder storage capacity, and increase time between voids. Onset: Slow acting (may take 8-12 weeks to determine efficacy). Medications include: Myrbetriq (Mirabegron) and Vibegron Seferino Dade). Potential side effects include but are not limited to: urinary retention / incomplete bladder emptying and elevated blood pressure (more likely to occur in individuals with pre-existing uncontrolled hypertension). These medications tend to be more expensive than the anticholinergic / antimuscarinic  medications.   For patients with refractory OAB (if the above treatment options have been unsuccessful): Posterior tibial nerve stimulation (PTNS). Small acupuncture-type needle inserted near ankle with electric current to stimulate bladder via posterior tibial nerve pathway. Initially requires 12 weekly in-office treatments lasting 30 minutes each; followed by monthly in-office treatments lasting 30 minutes each for 1 year.  Bladder Botox injections. How it is done: Typically done via in-office cystoscopy; sometimes done in the OR depending on the situation. The bladder is numbed with lidocaine  instilled via a catheter. Then the urologist injects Botox into the bladder muscle wall in about 20 locations. Causes local paralysis of the bladder muscle at the injection sites to reduce bladder muscle overactivity / spasms. The effect lasts for  approximately 6 months and cannot be reversed once performed. Risks may included but are not limited to: infection, incomplete bladder emptying/ urinary retention, short term need for self-catheterization or indwelling catheter, and need for repeat therapy. There is a 5-12% chance of needing to catheterize with Botox - that usually resolves in a few months as the Botox wears off. Typically Botox injections would need to be repeated every 3-12 months since this is not a permanent therapy.  Sacral neuromodulation trial (Medtronic lnterStim or Axonics implant). Sacral neuromodulation is FDA-approved for uncontrolled urinary urgency, urinary frequency, urinary urge incontinence, non-obstructive urinary retention, or fecal incontinence. It is not FDA-approved as a treatment for pain. The goal of this therapy is at least a 50% improvement in symptoms. It is NOT realistic to expect a 100% cure. This is a a 2-step outpatient procedure. After a successful test period, a permanent wire and generator are placed in the OR. We discussed the risk of infection. We reviewed the fact that about 30% of patients fail the test phase and are not candidates for permanent generator placement. During the 1-2 week trial phase, symptoms are documented by the patient to determine response. If patient gets at least a 50% improvement in symptoms, they may then proceed with Step 2. Step 1: Trial lead placement. Per physician discretion, may done one of two ways: Percutaneous nerve evaluation (PNE) in the [facility] urology office. Performed by urologist under local anesthesia (numbing the area with lidocaine ) using a spinal needle for placement of test wire, which usually stays in place for 5-7 days to determine therapy response. Test lead placement in OR under anesthesia. Usually stays in place 2 weeks to determine therapy response. > Step 2: Permanent implantation of sacral neuromodulation device, which is performed in the  OR.  Sacral neuromodulation implants: All are conditionally MRI safe. Manufacturer: Medtronic Website: [facility] therapy/right-for-you.html Options: lnterStim X: Non-rechargeable. The battery lasts 10 years on average. lnterStim Micro: Rechargeable. The battery lasts 15 years on average and must be charged routinely. Approximately 50% smaller implant than lnterStim X implant.  Manufacturer: Axonics Website: Findrealrelief.axonics.com Options: Non-rechargeable (Axonics F15): The battery lasts 15 years on average. Rechargeable (Axonics R20): The battery lasts 20 years on average and must be charged in office for about 1 hour every 6-10 months on average. Approximately 50% smaller implant than Axonics non-rechargeable implant.  Note: Generally the rechargeable devices are only advised for very small or thin patients who may not have sufficient adipose tissue to comfortably overlay the implanted device.  Suprapubic catheter (SP tube) placement. Only done in severely refractory OAB when all other options have failed or are not a viable treatment choice depending on patient factors. Involves placement of a catheter through the lower abdomen into the  bladder to continuously drain the bladder into an external collection bag, which patient can then empty at their convenience every few hours. Done via an outpatient surgical procedure in the OR under anesthesia. Risks may included but are not limited to: surgical site pain, infections, skin irritation / breakdown, chronic bacteriuria, symptomatic UTls. The SP tube must stay in place continuously. This is a reversible procedure however - the insertion site will close if catheter is removed for more than a few hours. The SP tube must be exchanged routinely every 4 weeks to prevent the catheter from becoming clogged with sediment. SP tube exchanges are typically performed  at a urology nurse visit or by a home health nurse.     UTI prevention / management:  Difference between Urinalysis (urine dipstick test) and Urine culture:  Urinalysis (urine dipstick test): A quick office test used as an indicator to determine whether or not further testing is necessary (such as a urine culture, urine microscopy, etc.) The urinalysis cannot differentiate a true bacterial UTI or give a definitive diagnosis for the findings.  Urine culture: May be performed based on the findings of a urinalysis to evaluate for UTI. Grows out on a petri dish for 48-72 hours. Provides important information about: whether or not bacterial growth is present and if so: what the predominant bacteria is which antibiotics will work best against that bacteria That information is important so that we can diagnose and treat patients appropriately as there are other conditions which may mimic UTls which must not be missed (such as cancer, interstitial cystitis, stones, etc.). Assists us  with antibiotic stewardship to minimize patient's risk for developing antibiotic resistance (getting to a point where no antibiotics work anymore).  Options when UTI symptoms occur: 1. Call [facility] [place] and request to speak with triage nurse (phone # [phone], select option 3). In accordance with clinic guidelines the nurse will determine next steps based on patient-reported symptoms, which may include: same-day lab visit to provide urine specimen, recommendation to schedule Urology office visit appointment for further evaluation, recommendation to proceed to ER, etc. 2. Call your Primary Care Provider (PCP) office to request urgent / same-day visit. Be sure to request for urine culture to be ordered and have results faxed to Urology (fax # [phone]).  3. Go to urgent care. Be sure to request for urine culture to be ordered and have results faxed to Urology (fax # [phone]).   For bladder  pain/ burning with urination: - Can take over-the-counter Pyridium (phenazopyridine; commonly known under the "AZO" brand) for a few days as needed. Limit use to no more than 3 days consecutively due to risk for methemoglobinemia, liver function issues, and bone health damage with long term use of Pyridium. - Alternative: Prescription urinary analgesics (such as Uribel, Urogesic blue, Urelle, Uro-MP). Often expensive / poorly covered by insurance unfortunately.  Options / recommendations for UTI prevention: - Topical vaginal estrogen for vaginal atrophy (aka Genitourinary Syndrome of Menopause (GSM)). - Adequate daily fluid intake to flush out the urinary tract. - Go to the bathroom to urinate every 4-6 hours while awake to minimize urinary stasis / bacterial overgrowth in the bladder. - Proanthocyanidin (PAC) supplement 36 mg daily; must be soluble (insoluble form of PAC will be ineffective). Recommended brand: Ellura. This is an over-the-counter supplement (often must be found/ purchased online) supplement derived from cranberries with concentrated active component: Proanthocyanidin (PAC) 36 mg daily. Decreases bacterial adherence to bladder lining.  - D-mannose powder (2 grams  daily). This is an over-the-counter supplement which decreases bacterial adherence to bladder lining (it is a sugar that inhibits bacterial adherence to urothelial cells by binding to the pili of enteric bacteria). Take as per manufacturer recommendation. Can be used as an alternative or in addition to the concentrated cranberry supplement.  - Vitamin C  supplement to acidify urine to minimize bacterial growth.  - Probiotic to maintain healthy vaginal microbiome to suppress bacteria at urethral opening. Brand recommendations: Estill Hemming (includes probiotic & D-mannose ), Feminine Balance (highest concentration of lactobacillus) or Hyperbiotic Pro 15.  Note for patients with diabetes:  - Be aware that D-mannose contains  sugar.  Note for patients with interstitial cystitis (IC):  - Patients with IC should typically avoid cranberry/ PAC supplements and Vitamin C  supplements due to their acidity, which may exacerbate IC-related bladder pain. - Symptoms of true bacterial UTI can overlap / mimic symptoms of an IC flare up. Antibiotic use is NOT indicated for IC flare ups. Urine culture needed prior to antibiotic treatment for IC patients. The goal is to minimize your risk for developing antibiotic-resistant bacteria.    Vaginal atrophy I Genitourinary syndrome of menopause (GSM):  What it is: Changes in the vaginal environment (including the vulva and urethra) including: Thinning of the epithelium (skin/ mucosa surface) Can contribute to urinary urgency and frequency Can contribute to dryness, itching, irritation of the vulvar and vaginal tissue Can contribute to pain with intercourse Can contribute to physical changes of the labia, vulva, and vagina such as: Narrowing of the vaginal opening Decreased vaginal length Loss of labial architecture Labial adhesions Pale color of vulvovaginal tissue  Loss of pubic hair Allows bacteria to become adherent  Results in increased risk for urinary tract infection (UTI) due to bacterial overgrowth and migration up the urethra into the bladder Change in vaginal pH (acid/ base balance) Allows for alteration / disruption of the normal bacterial flora / microbiome Results in increased risk for urinary tract infection (UTI) due to bacterial overgrowth  Treatment options: Over-the-counter lubricants (see list below). Prescription vaginal estrogen replacement. Options: Topical vaginal estrogen cream Estrace , Premarin, or compounded estradiol  cream/ gel We advise: Discard plastic applicator as that tends to use more medication than you need, which is not harmful but wastes / uses up the medication. Also the plastic applicator may cause discomfort. Insert blueberry size  amount of medication via the tip of your finger inside vagina nightly for 1 week then 2-3 times per week (long term). Estring  vaginal ring Exchanged every 3 months (either at home or in office by provider) Vagifem  vaginal tablet Inserted nightly for 2 weeks then twice a week (long term) lntrarosa vaginal suppository Vaginal DHEA: converts to estrogen in vaginal tissue without systemic effect Inserted nightly (long term) Vaginal laser therapy (Mona Lisa touch) Performed in 3 treatments each 6 weeks apart (available in our [place] office). Can feel like a sunburn for 3-4 days after each treatment until new skin heals in. Usually not covered by insurance. Estimated cost is $1500 for all 3 sessions.  FYI regarding prescription vaginal estrogen treatment options: All topical vaginal estrogen replacement options are equivalent in terms of efficacy. Topical vaginal estrogen replacement will take about 3 months to be effective. OK to have sex with any of the topical vaginal estrogen replacement options. Topical vaginal estrogen replacement may sting/burn initially due to severe dryness, which will improve with ongoing treatment. There have been studies that evaluate use of low-dose intravaginal estrogen that show minimal systemic absorption which  is negligible after 3 weeks. There have been no studies indicating increased risk of contributing to cancer development or recurrence.  Topical vaginal estrogen cream safe to use with breast cancer history [email or web address]  Topical vaginal estrogen cream safe to use with blood clot history [email or web address]   Lubricants and Moisturizers for Treating Genitourinary Syndrome of Menopause and Vulvovaginal  Atrophy Treatment Comments I Available Products   Lubricants   Water -based Ingredients: Deionized water , glycerin, propylene glycol; latex safe; rare irritation; dry out with extended sexual activity Astroglide, Good Clean Love, K-Y Jelly, Natural, Organic, Pink, Sliquid, Sylk, Yes    Oil Based Ingredients: avocado, olive, peanut, corn; latex safe; can be used with silicone products; staining; safe (unless peanut allergy); non-irritating Coconut oil, vegetable oil, vitamin E oil  Silicone-Based Ingredients: Silicone polymers; staining; typically nonirritating, long lasting; waterproof; should not be used with silicone dilators, sexual toys, or gynecologic products Astroglide X, Oceanus Ultra Pure, Pink Silicone, Pjur Eros, Replens Silky Smooth, Silicone Premium JO, SKYN, Uberlube, [employer] Based Minimize harm to sperm motility; designed Astroglide TTC, Conceive Plus, Pre for couples trying to conceive Seed, Yes Baby  Fertility Friendly Minimize harm to sperm motility; designed Astroglide, TTC, Conceive Plus, Pre for couples trying to conceive Seed, Yes Baby  Vaginal Moisturizers   Vaginal Moisturizers For maintenance use 1 to 3 times weekly; can benefit women with dryness, chafing with AOL, and recurrent vaginal infections irrespective of sexual activity timing Balance Active Menopause Vaginal Moisturizing Lubricant, Canesintima Intimate Moisturizer, Replens, Rephresh, Sylk Natural Intimate Moisturizer, Yes Vaginal Moisturizer  Hybrids Properties of both water  and silicone-based products (combination of a vaginal lubricant and moisturizer); Non-irritating; good option for women with allergies and sensitivities Lubrigyn, Luvena  Suppositories Hyaluronic acid to retain moisture Revaree  Vulvar Soothing Creams/Oils    Medicated CreamsP ain and burn relief; Ingredients: 4% Lidocaine , Aloe Vera gel Releveum ([place])  Non-Medicated Creams For anti-itch and  moisture/maintenance; Ingredients: Coconut oil, Avocado oil, Shea Butter, Olive oil, Vitamin E Vajuvenate, Vmagic  Oils !For moisture/maintenance !Coconut oil, Vitamin E oil, Emu oil     Electronically signed by:  Lauretta Ponto, MSN, FNP-C, CUNP 12/02/2023 11:00 AM

## 2023-12-02 ENCOUNTER — Encounter: Payer: Self-pay | Admitting: Urology

## 2023-12-02 ENCOUNTER — Ambulatory Visit: Admitting: Urology

## 2023-12-02 VITALS — BP 135/72 | HR 77

## 2023-12-02 DIAGNOSIS — R350 Frequency of micturition: Secondary | ICD-10-CM | POA: Diagnosis not present

## 2023-12-02 DIAGNOSIS — R3915 Urgency of urination: Secondary | ICD-10-CM | POA: Diagnosis not present

## 2023-12-02 DIAGNOSIS — N3010 Interstitial cystitis (chronic) without hematuria: Secondary | ICD-10-CM | POA: Diagnosis not present

## 2023-12-02 DIAGNOSIS — N3941 Urge incontinence: Secondary | ICD-10-CM | POA: Diagnosis not present

## 2023-12-02 DIAGNOSIS — N952 Postmenopausal atrophic vaginitis: Secondary | ICD-10-CM

## 2023-12-02 DIAGNOSIS — H04129 Dry eye syndrome of unspecified lacrimal gland: Secondary | ICD-10-CM | POA: Insufficient documentation

## 2023-12-02 LAB — URINALYSIS, ROUTINE W REFLEX MICROSCOPIC
Bilirubin, UA: NEGATIVE
Glucose, UA: NEGATIVE
Nitrite, UA: NEGATIVE
Specific Gravity, UA: 1.02 (ref 1.005–1.030)
Urobilinogen, Ur: 1 mg/dL (ref 0.2–1.0)
pH, UA: 6 (ref 5.0–7.5)

## 2023-12-02 LAB — MICROSCOPIC EXAMINATION

## 2023-12-02 MED ORDER — GEMTESA 75 MG PO TABS: 1.0000 | ORAL_TABLET | Freq: Every day | ORAL | Status: DC

## 2023-12-02 MED ORDER — GEMTESA 75 MG PO TABS: 1.0000 | ORAL_TABLET | Freq: Every day | ORAL | 11 refills | Status: AC

## 2023-12-02 MED ORDER — ESTRADIOL 0.1 MG/GM VA CREA: TOPICAL_CREAM | VAGINAL | 3 refills | Status: AC

## 2023-12-02 NOTE — Progress Notes (Signed)
 Samples of Gemtesa upfront.

## 2023-12-02 NOTE — Patient Instructions (Signed)
 Overactive bladder (OAB) overview for patients:  Symptoms may include: urinary urgency ("gotta go" feeling) urinary frequency (voiding >8 times per day) night time urination (nocturia) urge incontinence of urine (UUI)  While we do not know the exact etiology of OAB, several treatment options exist including:  Behavioral therapy: Reducing fluid intake Decreasing bladder stimulants (such as caffeine) and irritants (such as acidic food, spicy foods, alcohol) Urge suppression strategies Bladder retraining via timed voiding  Pelvic floor physical therapy  Medication(s) - can use one or both of the drug classes below. Anticholinergic / antimuscarinic medications:  Mechanism of action: Activate M3 receptors to reduce detrusor stimulation and increase bladder capacity  (parasympathetic nervous system). Effect: Relaxes the bladder to decrease overactivity, increase bladder storage capacity, and increase time between voids. Onset: Slow acting (may take 8-12 weeks to determine efficacy). Medications include: Vesicare (Solifenacin), Ditropan (Oxybutynin), Detrol (Tolterodine), Toviaz (Fesoterodine), Sanctura (Trospium), Urispas (Flavoxate), Enablex (Darifenacin), Bentyl (Dicyclomine), Levsin (Hyoscyamine ). Potential side effects include but are not limited to: Dry eyes, dry mouth, constipation, cognitive impairment, dementia risk with long term use, and urinary retention/ incomplete bladder emptying. Insurance companies generally prefer for patients to try 1-2 anticholinergic / antimuscarinic medications first due to low cost. Some exceptions are made based on patient-specific comorbidities / risk factors. Beta-3 agonist medications: Mechanism of action: Stimulates selective B3 adrenergic receptors to cause smooth muscle bladder relaxation (sympathetic nervous system). Effect: Relaxes the bladder to decrease overactivity, increase bladder storage capacity, and increase time between  voids. Onset: Slow acting (may take 8-12 weeks to determine efficacy). Medications include: Myrbetriq (Mirabegron) and Vibegron Doris Ford). Potential side effects include but are not limited to: urinary retention / incomplete bladder emptying and elevated blood pressure (more likely to occur in individuals with pre-existing uncontrolled hypertension). These medications tend to be more expensive than the anticholinergic / antimuscarinic medications.   For patients with refractory OAB (if the above treatment options have been unsuccessful): Posterior tibial nerve stimulation (PTNS). Small acupuncture-type needle inserted near ankle with electric current to stimulate bladder via posterior tibial nerve pathway. Initially requires 12 weekly in-office treatments lasting 30 minutes each; followed by monthly in-office treatments lasting 30 minutes each for 1 year.  Bladder Botox injections. How it is done: Typically done via in-office cystoscopy; sometimes done in the OR depending on the situation. The bladder is numbed with lidocaine instilled via a catheter. Then the urologist injects Botox into the bladder muscle wall in about 20 locations. Causes local paralysis of the bladder muscle at the injection sites to reduce bladder muscle overactivity / spasms. The effect lasts for approximately 6 months and cannot be reversed once performed. Risks may included but are not limited to: infection, incomplete bladder emptying/ urinary retention, short term need for self-catheterization or indwelling catheter, and need for repeat therapy. There is a 5-12% chance of needing to catheterize with Botox - that usually resolves in a few months as the Botox wears off. Typically Botox injections would need to be repeated every 3-12 months since this is not a permanent therapy.  Sacral neuromodulation trial (Medtronic lnterStim or Axonics implant). Sacral neuromodulation is FDA-approved for uncontrolled urinary urgency,  urinary frequency, urinary urge incontinence, non-obstructive urinary retention, or fecal incontinence. It is not FDA-approved as a treatment for pain. The goal of this therapy is at least a 50% improvement in symptoms. It is NOT realistic to expect a 100% cure. This is a a 2-step outpatient procedure. After a successful test period, a  permanent wire and generator are placed in the OR. We discussed the risk of infection. We reviewed the fact that about 30% of patients fail the test phase and are not candidates for permanent generator placement. During the 1-2 week trial phase, symptoms are documented by the patient to determine response. If patient gets at least a 50% improvement in symptoms, they may then proceed with Step 2. Step 1: Trial lead placement. Per physician discretion, may done one of two ways: Percutaneous nerve evaluation (PNE) in the [facility] urology office. Performed by urologist under local anesthesia (numbing the area with lidocaine) using a spinal needle for placement of test wire, which usually stays in place for 5-7 days to determine therapy response. Test lead placement in OR under anesthesia. Usually stays in place 2 weeks to determine therapy response. > Step 2: Permanent implantation of sacral neuromodulation device, which is performed in the OR.  Sacral neuromodulation implants: All are conditionally MRI safe. Manufacturer: Medtronic Website: BuffaloDryCleaner.gl therapy/right-for-you.html Options: lnterStim X: Non-rechargeable. The battery lasts 10 years on average. lnterStim Micro: Rechargeable. The battery lasts 15 years on average and must be charged routinely. Approximately 50% smaller implant than lnterStim X implant.  Manufacturer: Axonics Website: Findrealrelief.axonics.com Options: Non-rechargeable (Axonics F15): The battery lasts 15 years on average. Rechargeable (Axonics R20): The  battery lasts 20 years on average and must be charged in office for about 1 hour every 6-10 months on average. Approximately 50% smaller implant than Axonics non-rechargeable implant.  Note: Generally the rechargeable devices are only advised for very small or thin patients who may not have sufficient adipose tissue to comfortably overlay the implanted device.  Suprapubic catheter (SP tube) placement. Only done in severely refractory OAB when all other options have failed or are not a viable treatment choice depending on patient factors. Involves placement of a catheter through the lower abdomen into the bladder to continuously drain the bladder into an external collection bag, which patient can then empty at their convenience every few hours. Done via an outpatient surgical procedure in the OR under anesthesia. Risks may included but are not limited to: surgical site pain, infections, skin irritation / breakdown, chronic bacteriuria, symptomatic UTls. The SP tube must stay in place continuously. This is a reversible procedure however - the insertion site will close if catheter is removed for more than a few hours. The SP tube must be exchanged routinely every 4 weeks to prevent the catheter from becoming clogged with sediment. SP tube exchanges are typically performed at a urology nurse visit or by a home health nurse.                   UTI prevention / management:  Difference between Urinalysis (urine dipstick test) and Urine culture:  Urinalysis (urine dipstick test): A quick office test used as an indicator to determine whether or not further testing is necessary (such as a urine culture, urine microscopy, etc.) The urinalysis cannot differentiate a true bacterial UTI or give a definitive diagnosis for the findings.  Urine culture: May be performed based on the findings of a urinalysis to evaluate for UTI. Grows out on a petri dish for 48-72 hours. Provides important  information about: whether or not bacterial growth is present and if so: what the predominant bacteria is which antibiotics will work best against that bacteria That information is important so that we can diagnose and treat patients appropriately as there are other conditions which may mimic UTls which must not be missed (such  as cancer, interstitial cystitis, stones, etc.). Assists [place] with antibiotic stewardship to minimize patient's risk for developing antibiotic resistance (getting to a point where no antibiotics work anymore).  Options when UTI symptoms occur: 1. Call [facility] [employer] and request to speak with triage nurse (phone # [phone], select option 3). In accordance with clinic guidelines the nurse will determine next steps based on patient-reported symptoms, which may include: same-day lab visit to provide urine specimen, recommendation to schedule Urology office visit appointment for further evaluation, recommendation to proceed to ER, etc. 2. Call your Primary Care Provider (PCP) office to request urgent / same-day visit. Be sure to request for urine culture to be ordered and have results faxed to Urology (fax # [phone]).  3. Go to urgent care. Be sure to request for urine culture to be ordered and have results faxed to Urology (fax # [phone]).   For bladder pain/ burning with urination: - Can take over-the-counter Pyridium (phenazopyridine; commonly known under the "AZO" brand) for a few days as needed. Limit use to no more than 3 days consecutively due to risk for methemoglobinemia, liver function issues, and bone health damage with long term use of Pyridium. - Alternative: Prescription urinary analgesics (such as Uribel, Urogesic blue, Urelle, Uro-MP). Often expensive / poorly covered by insurance unfortunately.  Options / recommendations for UTI prevention: - Topical vaginal estrogen for vaginal atrophy (aka Genitourinary Syndrome of Menopause  (GSM)). - Adequate daily fluid intake to flush out the urinary tract. - Go to the bathroom to urinate every 4-6 hours while awake to minimize urinary stasis / bacterial overgrowth in the bladder. - Proanthocyanidin (PAC) supplement 36 mg daily; must be soluble (insoluble form of PAC will be ineffective). Recommended brand: Ellura. This is an over-the-counter supplement (often must be found/ purchased online) supplement derived from cranberries with concentrated active component: Proanthocyanidin (PAC) 36 mg daily. Decreases bacterial adherence to bladder lining.  - D-mannose powder (2 grams daily). This is an over-the-counter supplement which decreases bacterial adherence to bladder lining (it is a sugar that inhibits bacterial adherence to urothelial cells by binding to the pili of enteric bacteria). Take as per manufacturer recommendation. Can be used as an alternative or in addition to the concentrated cranberry supplement.  - Vitamin C supplement to acidify urine to minimize bacterial growth.  - Probiotic to maintain healthy vaginal microbiome to suppress bacteria at urethral opening. Brand recommendations: Darrold Junker (includes probiotic & D-mannose ), Feminine Balance (highest concentration of lactobacillus) or Hyperbiotic Pro 15.  Note for patients with diabetes:  - Be aware that D-mannose contains sugar.  Note for patients with interstitial cystitis (IC):  - Patients with IC should typically avoid cranberry/ PAC supplements and Vitamin C supplements due to their acidity, which may exacerbate IC-related bladder pain. - Symptoms of true bacterial UTI can overlap / mimic symptoms of an IC flare up. Antibiotic use is NOT indicated for IC flare ups. Urine culture needed prior to antibiotic treatment for IC patients. The goal is to minimize your risk for developing antibiotic-resistant bacteria.    Vaginal atrophy I Genitourinary syndrome of menopause (GSM):  What it is: Changes in the vaginal  environment (including the vulva and urethra) including: Thinning of the epithelium (skin/ mucosa surface) Can contribute to urinary urgency and frequency Can contribute to dryness, itching, irritation of the vulvar and vaginal tissue Can contribute to pain with intercourse Can contribute to physical changes of the labia, vulva, and vagina such as: Narrowing of the vaginal opening  Decreased vaginal length Loss of labial architecture Labial adhesions Pale color of vulvovaginal tissue Loss of pubic hair Allows bacteria to become adherent  Results in increased risk for urinary tract infection (UTI) due to bacterial overgrowth and migration up the urethra into the bladder Change in vaginal pH (acid/ base balance) Allows for alteration / disruption of the normal bacterial flora / microbiome Results in increased risk for urinary tract infection (UTI) due to bacterial overgrowth  Treatment options: Over-the-counter lubricants (see list below). Prescription vaginal estrogen replacement. Options: Topical vaginal estrogen cream Estrace, Premarin, or compounded estradiol cream/ gel We advise: Discard plastic applicator as that tends to use more medication than you need, which is not harmful but wastes / uses up the medication. Also the plastic applicator may cause discomfort. Insert blueberry size amount of medication via the tip of your finger inside vagina nightly for 1 week then 2-3 times per week (long term). Estring vaginal ring Exchanged every 3 months (either at home or in office by provider) Vagifem vaginal tablet Inserted nightly for 2 weeks then twice a week (long term) lntrarosa vaginal suppository Vaginal DHEA: converts to estrogen in vaginal tissue without systemic effect Inserted nightly (long term) Vaginal laser therapy (Mona Lisa touch) Performed in 3 treatments each 6 weeks apart (available in our [place] office). Can feel like a sunburn for 3-4 days after each treatment  until new skin heals in. Usually not covered by insurance. Estimated cost is $1500 for all 3 sessions.  FYI regarding prescription vaginal estrogen treatment options: All topical vaginal estrogen replacement options are equivalent in terms of efficacy. Topical vaginal estrogen replacement will take about 3 months to be effective. OK to have sex with any of the topical vaginal estrogen replacement options. Topical vaginal estrogen replacement may sting/burn initially due to severe dryness, which will improve with ongoing treatment. There have been studies that evaluate use of low-dose intravaginal estrogen that show minimal systemic absorption which is negligible after 3 weeks. There have been no studies indicating increased risk of contributing to cancer development or recurrence.  Topical vaginal estrogen cream safe to use with breast cancer history [email or web address]  Topical vaginal estrogen cream safe to use with blood clot history [email or web address]   Lubricants and Moisturizers for Treating Genitourinary Syndrome of Menopause and Vulvovaginal Atrophy Treatment Comments I Available Products   Lubricants   Water-based Ingredients: Deionized water, glycerin, propylene glycol; latex safe; rare irritation; dry out with extended sexual activity Astroglide, Good Clean Love, K-Y Jelly, Natural, Organic, Pink, Sliquid, Sylk, Yes    Oil Based Ingredients: avocado, olive, peanut, corn; latex safe; can be used with silicone products; staining; safe (unless peanut allergy); non-irritating Coconut oil, vegetable oil, vitamin E oil  Silicone-Based Ingredients: Silicone polymers; staining; typically nonirritating, long lasting; waterproof; should not be used  with silicone dilators, sexual toys, or gynecologic products Astroglide X, Oceanus Ultra Pure, Pink Silicone, Pjur Eros, Replens Silky Smooth, Silicone Premium JO, SKYN, Uberlube, [employer] Based Minimize harm to sperm motility; designed Astroglide TTC, Conceive Plus, Pre for couples trying to conceive Seed, Yes Baby  Fertility Friendly Minimize harm to sperm motility; designed Astroglide, TTC, Conceive Plus, Pre for couples trying to conceive Seed, Yes Baby  Vaginal Moisturizers   Vaginal Moisturizers For maintenance use 1 to 3 times weekly; can benefit women with dryness, chafing with AOL, and recurrent vaginal infections irrespective of sexual activity timing Balance Active Menopause Vaginal Moisturizing Lubricant, Canesintima Intimate Moisturizer, Replens, Rephresh, Sylk Natural Intimate Moisturizer, Yes Vaginal  Moisturizer  Hybrids Properties of both water and silicone-based products (combination of a vaginal lubricant and moisturizer); Non-irritating; good option for women with allergies and sensitivities Lubrigyn, Luvena  Suppositories Hyaluronic acid to retain moisture Revaree  Vulvar Soothing Creams/Oils    Medicated CreamsP ain and burn relief; Ingredients: 4% Lidocaine, Aloe Vera gel Releveum ([place])  Non-Medicated Creams For anti-itch and moisture/maintenance; Ingredients: Coconut oil, Avocado oil, Shea Butter, Olive oil, Vitamin E Vajuvenate, Vmagic  Oils !For moisture/maintenance !Coconut oil, Vitamin E oil, Emu oil

## 2023-12-14 ENCOUNTER — Ambulatory Visit: Payer: Medicare HMO

## 2023-12-24 ENCOUNTER — Telehealth: Payer: Self-pay | Admitting: Urology

## 2023-12-24 NOTE — Telephone Encounter (Signed)
 Patient made aware samples will be placed up front.

## 2023-12-24 NOTE — Telephone Encounter (Signed)
 Requesting samples of Gemtesa 

## 2024-01-01 DIAGNOSIS — H532 Diplopia: Secondary | ICD-10-CM | POA: Diagnosis not present

## 2024-01-01 DIAGNOSIS — H401133 Primary open-angle glaucoma, bilateral, severe stage: Secondary | ICD-10-CM | POA: Diagnosis not present

## 2024-01-07 ENCOUNTER — Encounter (HOSPITAL_BASED_OUTPATIENT_CLINIC_OR_DEPARTMENT_OTHER): Payer: Self-pay | Admitting: Obstetrics & Gynecology

## 2024-01-07 ENCOUNTER — Ambulatory Visit (HOSPITAL_BASED_OUTPATIENT_CLINIC_OR_DEPARTMENT_OTHER): Payer: Medicare HMO | Admitting: Obstetrics & Gynecology

## 2024-01-07 VITALS — BP 119/66 | HR 73 | Ht 64.0 in | Wt 113.0 lb

## 2024-01-07 DIAGNOSIS — M810 Age-related osteoporosis without current pathological fracture: Secondary | ICD-10-CM

## 2024-01-07 DIAGNOSIS — Z90710 Acquired absence of both cervix and uterus: Secondary | ICD-10-CM

## 2024-01-07 DIAGNOSIS — Z853 Personal history of malignant neoplasm of breast: Secondary | ICD-10-CM

## 2024-01-07 DIAGNOSIS — Z01419 Encounter for gynecological examination (general) (routine) without abnormal findings: Secondary | ICD-10-CM | POA: Diagnosis not present

## 2024-01-07 DIAGNOSIS — N3281 Overactive bladder: Secondary | ICD-10-CM

## 2024-01-07 DIAGNOSIS — Z8673 Personal history of transient ischemic attack (TIA), and cerebral infarction without residual deficits: Secondary | ICD-10-CM

## 2024-01-07 NOTE — Addendum Note (Signed)
 Addended by: Lillian Rein on: 01/07/2024 02:09 PM   Modules accepted: Level of Service

## 2024-01-07 NOTE — Progress Notes (Addendum)
 Breast and Pelvic Exam Patient name: Doris Ford MRN [id number]  Date of birth: [birth year] Chief Complaint:   Breast and Pelvic Exam  History of Present Illness:   Doris Ford is a 83 y.o. G2P2 Caucasian female being seen today for a breast and pelvic exam.  Denies vaginal bleeding.  Biggest complaint is urinary urgency.  Does get up at night with urinary urgency.  She has seen urology.  Gemtesa  and vaginal estrogen cream recommended.  Has follow up scheduled in July.  She does think this is helping some.    Dexa scan done 08/19/2023 showing osteoporosis in her arm.  She is now on fosamax.  She is on Vit D 2000 international units  daily and calcium  supplements as well.   Dosages reviewed with pt.    No LMP recorded (lmp unknown). Patient has had a hysterectomy.  Last pap Not indicated. Last mammogram: 08/19/2023 . Results were: normal.  Last colonoscopy: 05/06/2023.  No f/u screening recommended.   BMD:  1/22/205.  Osteoporosis in the forearm on the left.       01/07/2024    1:18 PM 01/22/2021    2:52 PM 09/05/2020    1:15 PM  Depression screen PHQ 2/9  Decreased Interest 0 1 0  Down, Depressed, Hopeless 0 1 1  PHQ - 2 Score 0 2 1  Altered sleeping  0 2  Tired, decreased energy  2 3  Change in appetite  0 0  Feeling bad or failure about yourself   0 0  Trouble concentrating  1 2  Moving slowly or fidgety/restless  0 1  Suicidal thoughts  0 0  PHQ-9 Score  5 9        01/22/2021    5:27 PM 01/22/2021    2:53 PM 01/22/2021    2:52 PM 09/05/2020    1:16 PM  GAD 7 : Generalized Anxiety Score  Nervous, Anxious, on Edge 1 1 1 1   Control/stop worrying 1 1 1 1   Worry too much - different things 1 1 1 1   Trouble relaxing 0 0 0 1  Restless 0 0 0 1  Easily annoyed or irritable 1 1 1 1   Afraid - awful might happen 1 1 1 1   Total GAD 7 Score 5 5 5 7   Anxiety Difficulty Somewhat difficult Somewhat difficult  Somewhat difficult     Review of Systems:   Pertinent items are  noted in HPI  Denies any bowel changes or pelvic pain. Pertinent History Reviewed:  Reviewed past medical,surgical, social and family history.  Reviewed problem list, medications and allergies. Physical Assessment:   Vitals:   01/07/24 1313  BP: 119/66  Pulse: 73  Weight: 113 lb (51.3 kg)  Height: 5' 4 (1.626 m)  Body mass index is 19.4 kg/m.        Physical Examination:   General appearance - well appearing, and in no distress  Mental status - alert, oriented to person, place, and time  Psych:  She has a normal mood and affect  Skin - warm and dry, normal color, no suspicious lesions noted  Chest - effort normal, all lung fields clear to auscultation bilaterally  Heart - normal rate and regular rhythm  Neck:  midline trachea, no thyromegaly or nodules  Breasts - breasts appear normal, no suspicious masses, no skin or nipple changes or  axillary nodes, well healed incision on right breast  Abdomen - soft, nontender, nondistended, no masses or organomegaly  Pelvic - VULVA: normal appearing vulva with no masses, tenderness or lesions   VAGINA: normal appearing vagina with normal color and discharge, no lesions   CERVIX: surgically absent  Thin prep pap is not indicated  UTERUS: surgically absent  ADNEXA: No adnexal masses or tenderness noted.  Rectal - normal rectal, good sphincter tone, no masses felt  Extremities:  No swelling or varicosities noted  Chaperone present for exam  Assessment & Plan:  1. Encounter for well woman exam with routine gynecological exam (Primary) - Pap smear not indicated. - Mammogram 2025.  Normal.  Screening guidelines reviewed.   - Colonoscopy 2023 - Bone mineral density 07/2023.  Reviewed with pt today.   - lab work done with PCP, Dr. Del Favia - vaccines reviewed/updated.  Pt aware she needs cologuard.    2. H/O vaginal hysterectomy  3. Age-related osteoporosis without current pathological fracture - taking calcium  and Vit D.  1000  international Vit D and 1200mg  calcium  recommended.   - On fosamax 70 mg weekly.    4. OAB (overactive bladder) - has seen urology - on gemtesa  and using vaginal estrogen.    5. History of CVA (cerebrovascular accident) - on Eliquis  6. History of breast cancer - s/p lumpectomy and tamoxifen treatment.  Diagnosis was in 2007.      No orders of the defined types were placed in this encounter.   Meds: No orders of the defined types were placed in this encounter.   Follow-up: Return for 1-2years.  Doris Rein, MD 01/07/2024 2:02 PM

## 2024-01-11 DIAGNOSIS — M2142 Flat foot [pes planus] (acquired), left foot: Secondary | ICD-10-CM | POA: Diagnosis not present

## 2024-01-18 ENCOUNTER — Ambulatory Visit: Payer: Medicare HMO

## 2024-01-25 DIAGNOSIS — M2142 Flat foot [pes planus] (acquired), left foot: Secondary | ICD-10-CM | POA: Diagnosis not present

## 2024-01-25 DIAGNOSIS — M76822 Posterior tibial tendinitis, left leg: Secondary | ICD-10-CM | POA: Diagnosis not present

## 2024-01-27 ENCOUNTER — Ambulatory Visit: Admitting: Urology

## 2024-01-27 ENCOUNTER — Encounter: Payer: Self-pay | Admitting: Urology

## 2024-01-27 VITALS — BP 125/68 | HR 76

## 2024-01-27 DIAGNOSIS — R350 Frequency of micturition: Secondary | ICD-10-CM

## 2024-01-27 DIAGNOSIS — N3010 Interstitial cystitis (chronic) without hematuria: Secondary | ICD-10-CM

## 2024-01-27 LAB — URINALYSIS, ROUTINE W REFLEX MICROSCOPIC
Bilirubin, UA: NEGATIVE
Glucose, UA: NEGATIVE
Nitrite, UA: NEGATIVE
Protein,UA: NEGATIVE
Specific Gravity, UA: 1.015 (ref 1.005–1.030)
Urobilinogen, Ur: 1 mg/dL (ref 0.2–1.0)
pH, UA: 7.5 (ref 5.0–7.5)

## 2024-01-27 LAB — MICROSCOPIC EXAMINATION

## 2024-01-27 NOTE — Progress Notes (Unsigned)
 Name: Doris Ford DOB: 12-23-1940 MRN: [id number]  History of Present Illness: [name] is a 84 y.o. female who presents today for follow up visit at [facility].  Relevant History includes: 1. Interstitial cystitis.  - Prior treatments included Elmiron, Hydroxyzine, Claritin , Cysta-Q. 2. OAB with urinary frequency, nocturia, urgency, and urge incontinence.  -  Patient's neurogenic risk factors: scoliosis, prior stroke. - Exacerbating factors: diuretic use (hydrochlorothiazide ), OSA (not on CPAP). - Not a safe candidate for anticholinergic medications due to risk for side effects based on patient's age, comorbidities (including prior stroke), and pre-existing dry eyes.  3. Vaginal atrophy.  - Previously used Vagifem  10 mcg tablets.   At last visit on 12/02/2023: - Denied bladder pain. - Reported bothersome urinary frequency, nocturia x2-3, urgency, and urge incontinence. Denies significant caffeine intake (just occasional).  - Couldn't start Myrbetriq 25 mg daily due to cost.  - The plan was: Gemtesa  (Vibegron ) 75 mg daily.  Minimize caffeine intake. Work on timed voiding / bladder retraining. Start topical vaginal estrogen cream as prescribed. Return in about 8 weeks (around 01/27/2024) for UA, PVR, & f/u with Lauraine Oz NP.   Today: She reports symptomatic improvement since starting Gemtesa  (Vibegron ) 75 mg daily. She reports improved urinary frequency, nocturia (x1-2), urgency, and urge incontinence. She denies dysuria, gross hematuria, straining to void, or sensations of incomplete emptying.   Medications: Current Outpatient Medications  Medication Sig Dispense Refill   acetaminophen  (TYLENOL ) 650 MG CR tablet Take 650-1,300 mg by mouth 2 (two) times daily as needed for pain.     alendronate (FOSAMAX) 70 MG tablet Take by mouth.     amLODipine  (NORVASC ) 5 MG tablet Take 5 mg by mouth every evening.     Ascorbic Acid  (VITAMIN C ) 1000 MG tablet Take  1,000 mg by mouth daily.     atorvastatin  (LIPITOR) 40 MG tablet Take 1 tablet (40 mg total) by mouth daily. 30 tablet 1   Calcium -Vitamin D (CALTRATE 600 PLUS-VIT D PO) Take 1 tablet by mouth daily.      Cholecalciferol  (VITAMIN D3) 50 MCG (2000 UT) TABS Take 2,000 Units by mouth daily.     clopidogrel  (PLAVIX ) 75 MG tablet Take 1 tablet (75 mg total) by mouth daily. 30 tablet 1   estradiol  (ESTRACE ) 0.1 MG/GM vaginal cream Discard plastic applicator. Insert a blueberry size amount (approximately 1 gram) of cream on fingertip inside vagina at bedtime every night for 1 week then every other night. For long term use. 30 g 3   hydrochlorothiazide  (HYDRODIURIL ) 12.5 MG tablet Take 12.5 mg by mouth daily.     latanoprost (XALATAN) 0.005 % ophthalmic solution 1 drop at bedtime.     Polyethyl Glyc-Propyl Glyc PF (SYSTANE HYDRATION PF) 0.4-0.3 % SOLN Place 1 drop into both eyes daily as needed (dry eyes).     polyethylene glycol (MIRALAX  / GLYCOLAX ) 17 g packet Take 17 g by mouth daily as needed for mild constipation. 14 each 0   potassium chloride  SA (KLOR-CON  M) 20 MEQ tablet Take 1 tablet (20 mEq total) by mouth daily for 14 days. 14 tablet 0   sertraline  (ZOLOFT ) 50 MG tablet Take 1.5 tablets (75 mg total) by mouth daily. 45 tablet 5   Vibegron  (GEMTESA ) 75 MG TABS Take 1 tablet (75 mg total) by mouth daily. 30 tablet 11   No current facility-administered medications for this visit.    Allergies: Allergies  Allergen Reactions   Codeine Nausea Only  Severe nausea.  Dizziness.   Macrobid  [Nitrofurantoin ] Other (See Comments)    Skin eruption    Past Medical History:  Diagnosis Date   Arthritis    Breast cancer (HCC) 06/2006   right breast/tx with lumpectomy and tamoxifen   COVID-19    Gastric ulcer    no NSAIDs   Hypertension    under control   Interstitial cystitis    Osteopenia    no change   Stroke [facility]) 2021   Past Surgical History:  Procedure Laterality Date   BIOPSY   05/31/2019   Procedure: BIOPSY;  Surgeon: Shaaron Lamar HERO, MD;  [facility];  Service: Endoscopy;;   BREAST SURGERY Right    lumpectomy-multiple times   BUNIONECTOMY WITH HAMMERTOE RECONSTRUCTION Bilateral ~2000   CATARACT EXTRACTION Bilateral 2007   COLONOSCOPY WITH PROPOFOL  N/A 05/06/2023   Procedure: COLONOSCOPY WITH PROPOFOL ;  Surgeon: Shaaron Lamar HERO, MD;  [facility];  Service: Endoscopy;  Laterality: N/A;  930am, asa 3   ESOPHAGOGASTRODUODENOSCOPY N/A 05/31/2019   Dr. Shaaron: widely patent Schatzki ring, non-bleeding cratered ulcer with adherent clot injected/thermally sealed. motled gastric mucosa with benign biopsy negative for H.pylori. plans for 3 month surveillance EGD.   ESOPHAGOGASTRODUODENOSCOPY N/A 10/05/2019   Procedure: ESOPHAGOGASTRODUODENOSCOPY (EGD);  Surgeon: Shaaron Lamar HERO, MD;  [facility];  Service: Endoscopy;  Laterality: N/A;  2:00pm - moved up per office   KNEE SURGERY Bilateral as teen   LOOP RECORDER INSERTION N/A 01/04/2020   Procedure: LOOP RECORDER INSERTION;  Surgeon: Inocencio Soyla Lunger, MD;  [facility];  Service: Cardiovascular;  Laterality: N/A;   POLYPECTOMY  05/06/2023   Procedure: POLYPECTOMY;  Surgeon: Shaaron Lamar HERO, MD;  [facility];  Service: Endoscopy;;   TONSILLECTOMY AND ADENOIDECTOMY  age 78   TOTAL ABDOMINAL HYSTERECTOMY W/ BILATERAL SALPINGOOPHORECTOMY  age 73   TOTAL HIP ARTHROPLASTY Right 12/16/2013   Procedure: RIGHT TOTAL HIP ARTHROPLASTY ANTERIOR APPROACH;  Surgeon: Lonni CINDERELLA Poli, MD;  Location: [facility];  Service: Orthopedics;  Laterality: Right;   TOTAL KNEE ARTHROPLASTY Right 12/09/2022   Procedure: TOTAL KNEE ARTHROPLASTY;  Surgeon: Ernie Cough, MD;  Location: [facility];  Service: Orthopedics;  Laterality: Right;   TOTAL KNEE ARTHROPLASTY Left 05/26/2023   Procedure: TOTAL KNEE ARTHROPLASTY;  Surgeon: Ernie Cough, MD;  Location: [facility];  Service: Orthopedics;   Laterality: Left;   Family History  Problem Relation Age of Onset   Cancer Mother        breast/mastectomy   Heart disease Mother        poor circulation   Deep vein thrombosis Mother    Osteoporosis Mother    Hypertension Father    Colon cancer Maternal Aunt    Sleep apnea Neg Hx    Social History   Socioeconomic History   Marital status: Married    Spouse name: Not on file   Number of children: Not on file   Years of education: Not on file   Highest education level: Not on file  Occupational History   Not on file  Tobacco Use   Smoking status: Never   Smokeless tobacco: Never  Vaping Use   Vaping status: Never Used  Substance and Sexual Activity   Alcohol  use: No   Drug use: No   Sexual activity: Not Currently    Partners: Male    Birth control/protection: Surgical    Comment: [facility]  Other Topics Concern   Not on file  Social History Narrative   Not on file   Social Drivers of Health   Financial Resource Strain: Not on file  Food Insecurity: No Food Insecurity (05/26/2023)   Hunger Vital Sign    Worried About Running Out of Food in the Last Year: Never true    Ran Out of Food in the Last Year: Never true  Transportation Needs: No Transportation Needs (05/26/2023)   PRAPARE - Administrator, Civil Service (Medical): No    Lack of Transportation (Non-Medical): No  Physical Activity: Not on file  Stress: Not on file  Social Connections: Not on file  Intimate Partner Violence: Not At Risk (05/26/2023)   Humiliation, Afraid, Rape, and Kick questionnaire    Fear of Current or Ex-Partner: No    Emotionally Abused: No    Physically Abused: No    Sexually Abused: No    Review of Systems Constitutional: Patient denies any unintentional weight loss or change in strength lntegumentary: Patient denies any rashes or pruritus Cardiovascular: Patient denies chest pain or syncope Respiratory: Patient denies shortness of breath Gastrointestinal: Patient  denies nausea, vomiting, constipation, or diarrhea  Musculoskeletal: Patient denies muscle cramps or weakness Neurologic: Patient denies convulsions or seizures Allergic/Immunologic: Patient denies recent allergic reaction(s) Hematologic/Lymphatic: Patient denies bleeding tendencies Endocrine: Patient denies heat/cold intolerance  GU: As per HPI.  OBJECTIVE Vitals:   01/27/24 1410  BP: 125/68  Pulse: 76   There is no height or weight on file to calculate BMI.  Physical Examination Constitutional: No obvious distress; patient is non-toxic appearing  Cardiovascular: No visible lower extremity edema.  Respiratory: The patient does not have audible wheezing/stridor; respirations do not appear labored  Gastrointestinal: Abdomen non-distended Musculoskeletal: Normal ROM of UEs  Skin: No obvious rashes/open sores  Neurologic: CN 2-12 grossly intact Psychiatric: Answered questions appropriately with normal affect  Hematologic/Lymphatic/Immunologic: No obvious bruises or sites of spontaneous bleeding  PVR: 0 ml  ASSESSMENT Chronic interstitial cystitis - Plan: Urinalysis, Routine w reflex microscopic  Urinary frequency - Plan: BLADDER SCAN AMB NON-IMAGING  Doing well on Gemtesa ; will continue that and plan for follow up in 6 months or sooner if needed. Patient verbalized understanding of and agreement with current plan. All questions were answered.  PLAN Advised the following: 1. Continue Gemtesa  (Vibegron ) 75 mg daily. 2. Return in about 6 months (around 07/29/2024) for OAB, with UA & PVR.  Orders Placed This Encounter  Procedures   Urinalysis, Routine w reflex microscopic   BLADDER SCAN AMB NON-IMAGING    It has been explained that the patient is to follow regularly with their PCP in addition to all other providers involved in their care and to follow instructions provided by these respective offices. Patient advised to contact urology clinic if any urologic-pertaining  questions, concerns, new symptoms or problems arise in the interim period.  There are no Patient Instructions on file for this visit.  Electronically signed by:  Lauraine JAYSON Oz, FNP   01/27/24    2:58 PM

## 2024-01-27 NOTE — Progress Notes (Unsigned)
 Bladder Scan completed today.  Patient can void prior to the bladder scan. Bladder scan result: 0  Performed By: Exie T. CMA

## 2024-02-03 DIAGNOSIS — H401133 Primary open-angle glaucoma, bilateral, severe stage: Secondary | ICD-10-CM | POA: Diagnosis not present

## 2024-02-10 DIAGNOSIS — M76822 Posterior tibial tendinitis, left leg: Secondary | ICD-10-CM | POA: Diagnosis not present

## 2024-02-10 DIAGNOSIS — M25572 Pain in left ankle and joints of left foot: Secondary | ICD-10-CM | POA: Diagnosis not present

## 2024-02-12 DIAGNOSIS — M76822 Posterior tibial tendinitis, left leg: Secondary | ICD-10-CM | POA: Diagnosis not present

## 2024-02-12 DIAGNOSIS — M25572 Pain in left ankle and joints of left foot: Secondary | ICD-10-CM | POA: Diagnosis not present

## 2024-02-15 DIAGNOSIS — M76822 Posterior tibial tendinitis, left leg: Secondary | ICD-10-CM | POA: Diagnosis not present

## 2024-02-15 DIAGNOSIS — M2142 Flat foot [pes planus] (acquired), left foot: Secondary | ICD-10-CM | POA: Diagnosis not present

## 2024-02-16 DIAGNOSIS — M25572 Pain in left ankle and joints of left foot: Secondary | ICD-10-CM | POA: Diagnosis not present

## 2024-02-17 DIAGNOSIS — M76822 Posterior tibial tendinitis, left leg: Secondary | ICD-10-CM | POA: Diagnosis not present

## 2024-02-17 DIAGNOSIS — M25572 Pain in left ankle and joints of left foot: Secondary | ICD-10-CM | POA: Diagnosis not present

## 2024-02-19 DIAGNOSIS — M2142 Flat foot [pes planus] (acquired), left foot: Secondary | ICD-10-CM | POA: Diagnosis not present

## 2024-02-19 DIAGNOSIS — M76822 Posterior tibial tendinitis, left leg: Secondary | ICD-10-CM | POA: Diagnosis not present

## 2024-02-23 DIAGNOSIS — M25572 Pain in left ankle and joints of left foot: Secondary | ICD-10-CM | POA: Diagnosis not present

## 2024-02-23 DIAGNOSIS — M76822 Posterior tibial tendinitis, left leg: Secondary | ICD-10-CM | POA: Diagnosis not present

## 2024-02-26 DIAGNOSIS — M25572 Pain in left ankle and joints of left foot: Secondary | ICD-10-CM | POA: Diagnosis not present

## 2024-02-26 DIAGNOSIS — M76822 Posterior tibial tendinitis, left leg: Secondary | ICD-10-CM | POA: Diagnosis not present

## 2024-02-29 DIAGNOSIS — I10 Essential (primary) hypertension: Secondary | ICD-10-CM | POA: Diagnosis not present

## 2024-02-29 DIAGNOSIS — R7301 Impaired fasting glucose: Secondary | ICD-10-CM | POA: Diagnosis not present

## 2024-03-01 DIAGNOSIS — M76822 Posterior tibial tendinitis, left leg: Secondary | ICD-10-CM | POA: Diagnosis not present

## 2024-03-01 DIAGNOSIS — M25572 Pain in left ankle and joints of left foot: Secondary | ICD-10-CM | POA: Diagnosis not present

## 2024-03-03 DIAGNOSIS — M76822 Posterior tibial tendinitis, left leg: Secondary | ICD-10-CM | POA: Diagnosis not present

## 2024-03-03 DIAGNOSIS — M25572 Pain in left ankle and joints of left foot: Secondary | ICD-10-CM | POA: Diagnosis not present

## 2024-03-04 DIAGNOSIS — M810 Age-related osteoporosis without current pathological fracture: Secondary | ICD-10-CM | POA: Diagnosis not present

## 2024-03-04 DIAGNOSIS — N3281 Overactive bladder: Secondary | ICD-10-CM | POA: Diagnosis not present

## 2024-03-04 DIAGNOSIS — K2210 Ulcer of esophagus without bleeding: Secondary | ICD-10-CM | POA: Diagnosis not present

## 2024-03-04 DIAGNOSIS — M170 Bilateral primary osteoarthritis of knee: Secondary | ICD-10-CM | POA: Diagnosis not present

## 2024-03-04 DIAGNOSIS — Z23 Encounter for immunization: Secondary | ICD-10-CM | POA: Diagnosis not present

## 2024-03-04 DIAGNOSIS — Z8673 Personal history of transient ischemic attack (TIA), and cerebral infarction without residual deficits: Secondary | ICD-10-CM | POA: Diagnosis not present

## 2024-03-04 DIAGNOSIS — M545 Low back pain, unspecified: Secondary | ICD-10-CM | POA: Diagnosis not present

## 2024-03-04 DIAGNOSIS — E876 Hypokalemia: Secondary | ICD-10-CM | POA: Diagnosis not present

## 2024-03-04 DIAGNOSIS — F419 Anxiety disorder, unspecified: Secondary | ICD-10-CM | POA: Diagnosis not present

## 2024-03-04 DIAGNOSIS — M76822 Posterior tibial tendinitis, left leg: Secondary | ICD-10-CM | POA: Diagnosis not present

## 2024-03-04 DIAGNOSIS — I10 Essential (primary) hypertension: Secondary | ICD-10-CM | POA: Diagnosis not present

## 2024-03-04 DIAGNOSIS — R7301 Impaired fasting glucose: Secondary | ICD-10-CM | POA: Diagnosis not present

## 2024-03-09 DIAGNOSIS — M76822 Posterior tibial tendinitis, left leg: Secondary | ICD-10-CM | POA: Diagnosis not present

## 2024-03-09 DIAGNOSIS — M25572 Pain in left ankle and joints of left foot: Secondary | ICD-10-CM | POA: Diagnosis not present

## 2024-03-11 DIAGNOSIS — M76822 Posterior tibial tendinitis, left leg: Secondary | ICD-10-CM | POA: Diagnosis not present

## 2024-03-11 DIAGNOSIS — M25572 Pain in left ankle and joints of left foot: Secondary | ICD-10-CM | POA: Diagnosis not present

## 2024-03-14 DIAGNOSIS — M25572 Pain in left ankle and joints of left foot: Secondary | ICD-10-CM | POA: Diagnosis not present

## 2024-03-14 DIAGNOSIS — M76822 Posterior tibial tendinitis, left leg: Secondary | ICD-10-CM | POA: Diagnosis not present

## 2024-04-08 ENCOUNTER — Ambulatory Visit: Admitting: Podiatry

## 2024-04-11 DIAGNOSIS — M76822 Posterior tibial tendinitis, left leg: Secondary | ICD-10-CM | POA: Diagnosis not present

## 2024-04-12 ENCOUNTER — Ambulatory Visit: Admitting: Podiatry

## 2024-04-12 ENCOUNTER — Encounter: Payer: Self-pay | Admitting: Podiatry

## 2024-04-12 DIAGNOSIS — S90222A Contusion of left lesser toe(s) with damage to nail, initial encounter: Secondary | ICD-10-CM

## 2024-04-12 DIAGNOSIS — M2042 Other hammer toe(s) (acquired), left foot: Secondary | ICD-10-CM | POA: Diagnosis not present

## 2024-04-12 DIAGNOSIS — M2041 Other hammer toe(s) (acquired), right foot: Secondary | ICD-10-CM | POA: Diagnosis not present

## 2024-04-12 NOTE — Progress Notes (Signed)
 Subjective:  Patient ID: Doris Ford, female    DOB: 1941/05/17,  MRN: [id number] HPI Chief Complaint  Patient presents with   Foot Pain    Knee replacement last year, went through rehab, noticed arch was hurting in March 2025, saw foot doc at [facility] she had tendonitis, tried pain patches, ice, did have MRI-confirmed tendonitis-wearing orthotics made for her and went to PT-thinks she need orthotics adjusted-having bruising hallux left (for 6 weeks now)-no injury, 2nd toe overlaps onto hallux, thinks orthotics are causing it, only wearing for 6 hours a day right now, redness in the arch of left, tenderness, wanted another opinion   New Patient (Initial Visit)    83 y.o. female presents with the above complaint.   ROS: Denies fever chills nausea mobic muscle aches pains calf pain back pain chest pain shortness of breath  Past Medical History:  Diagnosis Date   Arthritis    Breast cancer (HCC) 06/2006   right breast/tx with lumpectomy and tamoxifen   COVID-19    Gastric ulcer    no NSAIDs   Hypertension    under control   Interstitial cystitis    Osteopenia    no change   Stroke [facility]) 2021   Past Surgical History:  Procedure Laterality Date   BIOPSY  05/31/2019   Procedure: BIOPSY;  Surgeon: Shaaron Lamar HERO, MD;  [facility];  Service: Endoscopy;;   BREAST SURGERY Right    lumpectomy-multiple times   BUNIONECTOMY WITH HAMMERTOE RECONSTRUCTION Bilateral ~2000   CATARACT EXTRACTION Bilateral 2007   COLONOSCOPY WITH PROPOFOL  N/A 05/06/2023   Procedure: COLONOSCOPY WITH PROPOFOL ;  Surgeon: Shaaron Lamar HERO, MD;  [facility];  Service: Endoscopy;  Laterality: N/A;  930am, asa 3   ESOPHAGOGASTRODUODENOSCOPY N/A 05/31/2019   Dr. Shaaron: widely patent Schatzki ring, non-bleeding cratered ulcer with adherent clot injected/thermally sealed. motled gastric mucosa with benign biopsy negative for H.pylori. plans for 3 month surveillance EGD.    ESOPHAGOGASTRODUODENOSCOPY N/A 10/05/2019   Procedure: ESOPHAGOGASTRODUODENOSCOPY (EGD);  Surgeon: Shaaron Lamar HERO, MD;  [facility];  Service: Endoscopy;  Laterality: N/A;  2:00pm - moved up per office   KNEE SURGERY Bilateral as teen   LOOP RECORDER INSERTION N/A 01/04/2020   Procedure: LOOP RECORDER INSERTION;  Surgeon: Inocencio Soyla Lunger, MD;  [facility];  Service: Cardiovascular;  Laterality: N/A;   POLYPECTOMY  05/06/2023   Procedure: POLYPECTOMY;  Surgeon: Shaaron Lamar HERO, MD;  [facility];  Service: Endoscopy;;   TONSILLECTOMY AND ADENOIDECTOMY  age 40   TOTAL ABDOMINAL HYSTERECTOMY W/ BILATERAL SALPINGOOPHORECTOMY  age 42   TOTAL HIP ARTHROPLASTY Right 12/16/2013   Procedure: RIGHT TOTAL HIP ARTHROPLASTY ANTERIOR APPROACH;  Surgeon: Lonni CINDERELLA Poli, MD;  [facility];  Service: Orthopedics;  Laterality: Right;   TOTAL KNEE ARTHROPLASTY Right 12/09/2022   Procedure: TOTAL KNEE ARTHROPLASTY;  Surgeon: Ernie Cough, MD;  [facility]: [facility];  Service: Orthopedics;  Laterality: Right;   TOTAL KNEE ARTHROPLASTY Left 05/26/2023   Procedure: TOTAL KNEE ARTHROPLASTY;  Surgeon: Ernie Cough, MD;  [facility]: [facility];  Service: Orthopedics;  Laterality: Left;    Current Outpatient Medications:    acetaminophen  (TYLENOL ) 650 MG CR tablet, Take 650-1,300 mg by mouth 2 (two) times daily as needed for pain., Disp: , Rfl:    alendronate (FOSAMAX) 70 MG tablet, Take by mouth., Disp: , Rfl:    amLODipine  (NORVASC ) 5 MG tablet, Take 5 mg by mouth every evening., Disp: ,  Rfl:    Ascorbic Acid  (VITAMIN C ) 1000 MG tablet, Take 1,000 mg by mouth daily., Disp: , Rfl:    atorvastatin  (LIPITOR) 40 MG tablet, Take 1 tablet (40 mg total) by mouth daily., Disp: 30 tablet, Rfl: 1   Calcium -Vitamin D (CALTRATE 600 PLUS-VIT D PO), Take 1 tablet by mouth daily. , Disp: , Rfl:    Cholecalciferol  (VITAMIN D3) 50 MCG (2000 UT) TABS, Take 2,000 Units by mouth daily.,  Disp: , Rfl:    clopidogrel  (PLAVIX ) 75 MG tablet, Take 1 tablet (75 mg total) by mouth daily., Disp: 30 tablet, Rfl: 1   estradiol  (ESTRACE ) 0.1 MG/GM vaginal cream, Discard plastic applicator. Insert a blueberry size amount (approximately 1 gram) of cream on fingertip inside vagina at bedtime every night for 1 week then every other night. For long term use., Disp: 30 g, Rfl: 3   hydrochlorothiazide  (HYDRODIURIL ) 12.5 MG tablet, Take 12.5 mg by mouth daily., Disp: , Rfl:    latanoprost (XALATAN) 0.005 % ophthalmic solution, 1 drop at bedtime., Disp: , Rfl:    Polyethyl Glyc-Propyl Glyc PF (SYSTANE HYDRATION PF) 0.4-0.3 % SOLN, Place 1 drop into both eyes daily as needed (dry eyes)., Disp: , Rfl:    polyethylene glycol (MIRALAX  / GLYCOLAX ) 17 g packet, Take 17 g by mouth daily as needed for mild constipation., Disp: 14 each, Rfl: 0   potassium chloride  SA (KLOR-CON  M) 20 MEQ tablet, Take 1 tablet (20 mEq total) by mouth daily for 14 days., Disp: 14 tablet, Rfl: 0   sertraline  (ZOLOFT ) 50 MG tablet, Take 1.5 tablets (75 mg total) by mouth daily., Disp: 45 tablet, Rfl: 5   Vibegron  (GEMTESA ) 75 MG TABS, Take 1 tablet (75 mg total) by mouth daily., Disp: 30 tablet, Rfl: 11  Allergies  Allergen Reactions   Codeine Nausea Only    Severe nausea.  Dizziness.   Macrobid  [Nitrofurantoin ] Other (See Comments)    Skin eruption   Review of Systems Objective:  There were no vitals filed for this visit.  General: Well developed, nourished, in no acute distress, alert and oriented x3   Dermatological: Skin is warm, dry and supple bilateral. Nails x 10 are well maintained; remaining integument appears unremarkable at this time. There are no open sores, no preulcerative lesions, no rash or signs of infection present.  Hallux nail left demonstrates a subungual hematoma.  Hematoma is only extending approximately three quarters of the way to the distal margin of the toenail.  The second toe also sits on top of  the lateral margin of the nail also is causing some irritation.  Vascular: Dorsalis Pedis artery and Posterior Tibial artery pedal pulses are 2/4 bilateral with immedate capillary fill time. Pedal hair growth present. No varicosities and no lower extremity edema present bilateral.   Neruologic: Grossly intact via light touch bilateral. Vibratory intact via tuning fork bilateral. Protective threshold with Semmes Wienstein monofilament intact to all pedal sites bilateral. Patellar and Achilles deep tendon reflexes 2+ bilateral. No Babinski or clonus noted bilateral.   Musculoskeletal: No gross boney pedal deformities bilateral. No pain, crepitus, or limitation noted with foot and ankle range of motion bilateral. Muscular strength 5/5 in all groups tested bilateral.  Gait: Unassisted, Nonantalgic.    Radiographs:  None taken  Assessment & Plan:   Assessment: Subungual hematoma hallux left.  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 [place], [place]

## 2024-04-18 DIAGNOSIS — Z96653 Presence of artificial knee joint, bilateral: Secondary | ICD-10-CM | POA: Diagnosis not present

## 2024-06-21 DIAGNOSIS — R7301 Impaired fasting glucose: Secondary | ICD-10-CM | POA: Diagnosis not present

## 2024-06-28 DIAGNOSIS — M545 Low back pain, unspecified: Secondary | ICD-10-CM | POA: Diagnosis not present

## 2024-06-28 DIAGNOSIS — M170 Bilateral primary osteoarthritis of knee: Secondary | ICD-10-CM | POA: Diagnosis not present

## 2024-06-28 DIAGNOSIS — Z0000 Encounter for general adult medical examination without abnormal findings: Secondary | ICD-10-CM | POA: Diagnosis not present

## 2024-06-28 DIAGNOSIS — F419 Anxiety disorder, unspecified: Secondary | ICD-10-CM | POA: Diagnosis not present

## 2024-06-28 DIAGNOSIS — E876 Hypokalemia: Secondary | ICD-10-CM | POA: Diagnosis not present

## 2024-06-28 DIAGNOSIS — I10 Essential (primary) hypertension: Secondary | ICD-10-CM | POA: Diagnosis not present

## 2024-06-28 DIAGNOSIS — R008 Other abnormalities of heart beat: Secondary | ICD-10-CM | POA: Diagnosis not present

## 2024-06-28 DIAGNOSIS — N3281 Overactive bladder: Secondary | ICD-10-CM | POA: Diagnosis not present

## 2024-06-28 DIAGNOSIS — K2210 Ulcer of esophagus without bleeding: Secondary | ICD-10-CM | POA: Diagnosis not present

## 2024-06-28 DIAGNOSIS — M810 Age-related osteoporosis without current pathological fracture: Secondary | ICD-10-CM | POA: Diagnosis not present

## 2024-06-28 DIAGNOSIS — R7301 Impaired fasting glucose: Secondary | ICD-10-CM | POA: Diagnosis not present

## 2024-06-28 DIAGNOSIS — Z23 Encounter for immunization: Secondary | ICD-10-CM | POA: Diagnosis not present

## 2024-08-01 ENCOUNTER — Ambulatory Visit: Admitting: Urology

## 2024-08-01 ENCOUNTER — Encounter: Payer: Self-pay | Admitting: Urology

## 2024-08-01 VITALS — BP 132/72 | HR 76

## 2024-08-01 DIAGNOSIS — R8271 Bacteriuria: Secondary | ICD-10-CM

## 2024-08-01 DIAGNOSIS — N952 Postmenopausal atrophic vaginitis: Secondary | ICD-10-CM | POA: Diagnosis not present

## 2024-08-01 DIAGNOSIS — N3941 Urge incontinence: Secondary | ICD-10-CM

## 2024-08-01 LAB — URINALYSIS, ROUTINE W REFLEX MICROSCOPIC
Bilirubin, UA: NEGATIVE
Glucose, UA: NEGATIVE
Nitrite, UA: NEGATIVE
Specific Gravity, UA: 1.025 (ref 1.005–1.030)
Urobilinogen, Ur: 1 mg/dL (ref 0.2–1.0)
pH, UA: 6 (ref 5.0–7.5)

## 2024-08-01 LAB — MICROSCOPIC EXAMINATION: Epithelial Cells (non renal): >10 /HPF — AB (ref 0–10)

## 2024-08-01 LAB — BLADDER SCAN AMB NON-IMAGING: Scan Result: 0

## 2024-08-01 NOTE — Progress Notes (Signed)
 "  08/01/2024 11:18 AM   Doris Ford 1941/03/23 [id number]  Referring provider: Shona Norleen PEDLAR, Ford [address] [name],  [place] [address]  No chief complaint on file.   HPI: New patient for me-  1) OAB-symptoms of frequency, nocturia urgency and urge incontinence.  Neurogenic risk includes prior CVA.  Associated with diuretic use, OSA, dry eyes.  Prescription for Gemtesa  May 2025.  Symptoms improved.  2) atrophic vaginitis-previously used Vagifem  10 mcg tablets.  Prescription for topical vaginal estrogen May 2025.  Instructed again July 2025.    3) interstitial cystitis-remote history.  Was on Elmiron and cysta-Q with Dr. Janit.  Used hydroxyzine in the past.  08/01/2024: Today patient seen for the above.  On Gemtesa . She is using the TVE. Her LUTS much improved. She can tell they are working. Still has urgency. Noc x 1. Pad use but they are dry. She gave just enough urine for specimen but it had greater than 10 epithelial cells 3-10 RBC, 6-10 WBC and moderate bacteria.  Not enough for a culture. PVR 0.   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    PMH: Past Medical History:  Diagnosis Date   Arthritis    Breast cancer (HCC) 06/2006   right breast/tx with lumpectomy and tamoxifen   COVID-19    Gastric ulcer    no NSAIDs   Hypertension    under control   Interstitial cystitis    Osteopenia    no change   Stroke [facility]) 2021    Surgical History: Past Surgical History:  Procedure Laterality Date   BIOPSY  05/31/2019   Procedure: BIOPSY;  Surgeon: Doris Lamar HERO, Ford;  [facility]: [facility];  Service: Endoscopy;;   BREAST SURGERY Right    lumpectomy-multiple times   BUNIONECTOMY WITH HAMMERTOE RECONSTRUCTION Bilateral ~2000   CATARACT EXTRACTION Bilateral 2007   COLONOSCOPY WITH PROPOFOL  N/A 05/06/2023   Procedure: COLONOSCOPY WITH PROPOFOL ;  Surgeon: Doris Lamar HERO, Ford;  [facility]: [facility];   Service: Endoscopy;  Laterality: N/A;  930am, asa 3   ESOPHAGOGASTRODUODENOSCOPY N/A 05/31/2019   Doris Ford: widely patent Schatzki ring, non-bleeding cratered ulcer with adherent clot injected/thermally sealed. motled gastric mucosa with benign biopsy negative for H.pylori. plans for 3 month surveillance EGD.   ESOPHAGOGASTRODUODENOSCOPY N/A 10/05/2019   Procedure: ESOPHAGOGASTRODUODENOSCOPY (EGD);  Surgeon: Doris Lamar HERO, [place];  Location: [facility];  Service: Endoscopy;  Laterality: N/A;  2:00pm - moved up per office   KNEE SURGERY Bilateral as teen   LOOP RECORDER INSERTION N/A 01/04/2020   Procedure: LOOP RECORDER INSERTION;  Surgeon: Doris Soyla Lunger, Ford;  [facility]: [facility];  Service: Cardiovascular;  Laterality: N/A;   POLYPECTOMY  05/06/2023   Procedure: POLYPECTOMY;  Surgeon: Doris Lamar HERO, Ford;  Location: [facility];  Service: Endoscopy;;   TONSILLECTOMY AND ADENOIDECTOMY  age 68   TOTAL ABDOMINAL HYSTERECTOMY W/ BILATERAL SALPINGOOPHORECTOMY  age 65   TOTAL HIP ARTHROPLASTY Right 12/16/2013   Procedure: RIGHT TOTAL HIP ARTHROPLASTY ANTERIOR APPROACH;  Surgeon: Doris CINDERELLA Poli, Ford;  Location: [facility];  Service: Orthopedics;  Laterality: Right;   TOTAL KNEE ARTHROPLASTY Right 12/09/2022   Procedure: TOTAL KNEE ARTHROPLASTY;  Surgeon: Doris Cough, Ford;  Location: [facility];  Service: Orthopedics;  Laterality: Right;   TOTAL KNEE ARTHROPLASTY Left 05/26/2023   Procedure: TOTAL KNEE ARTHROPLASTY;  Surgeon: Doris Cough, Ford;  Location: [facility];  Service: Orthopedics;  Laterality: Left;    Home Medications:  Allergies as of 08/01/2024       Reactions   Codeine Nausea Only   Severe nausea.  Dizziness.   Macrobid  [nitrofurantoin ] Other (See Comments)   Skin eruption        Medication List        Accurate as of August 01, 2024 11:18 AM. If you have any questions, ask your nurse or doctor.          acetaminophen  650 MG CR tablet Commonly known as:  TYLENOL  Take 650-1,300 mg by mouth 2 (two) times daily as needed for pain.   alendronate 70 MG tablet Commonly known as: FOSAMAX Take by mouth.   amLODipine  5 MG tablet Commonly known as: NORVASC  Take 5 mg by mouth every evening.   atorvastatin  40 MG tablet Commonly known as: LIPITOR Take 1 tablet (40 mg total) by mouth daily.   CALTRATE 600 PLUS-VIT D PO Take 1 tablet by mouth daily.   clopidogrel  75 MG tablet Commonly known as: PLAVIX  Take 1 tablet (75 mg total) by mouth daily.   estradiol  0.1 MG/GM vaginal cream Commonly known as: ESTRACE  Discard plastic applicator. Insert a blueberry size amount (approximately 1 gram) of cream on fingertip inside vagina at bedtime every night for 1 week then every other night. For long term use.   Gemtesa  75 MG Tabs Generic drug: Vibegron  Take 1 tablet (75 mg total) by mouth daily.   hydrochlorothiazide  12.5 MG tablet Commonly known as: HYDRODIURIL  Take 12.5 mg by mouth daily.   latanoprost 0.005 % ophthalmic solution Commonly known as: XALATAN 1 drop at bedtime.   polyethylene glycol 17 g packet Commonly known as: MIRALAX  / GLYCOLAX  Take 17 g by mouth daily as needed for mild constipation.   potassium chloride  SA 20 MEQ tablet Commonly known as: KLOR-CON  M Take 1 tablet (20 mEq total) by mouth daily for 14 days.   sertraline  50 MG tablet Commonly known as: Zoloft  Take 1.5 tablets (75 mg total) by mouth daily.   Systane Hydration PF 0.4-0.3 % Soln Generic drug: Polyethyl Glyc-Propyl Glyc PF Place 1 drop into both eyes daily as needed (dry eyes).   vitamin C  1000 MG tablet Take 1,000 mg by mouth daily.   Vitamin D3 50 MCG (2000 UT) Tabs Take 2,000 Units by mouth daily.        Allergies: Allergies[1]  Family History: Family History  Problem Relation Age of Onset   Cancer Mother        breast/mastectomy   Heart disease Mother        poor circulation   Deep vein thrombosis Mother    Osteoporosis Mother     Hypertension Father    Colon cancer Maternal Aunt    Sleep apnea Neg Hx     Social History:  reports that she has never smoked. She has never used smokeless tobacco. She reports that she does not drink alcohol  and does not use drugs.   Physical Exam: BP 132/72   Pulse 76   LMP  (LMP Unknown)   Constitutional:  Alert and oriented, No acute distress. HEENT: Doris Ford AT, moist mucus membranes.  Trachea midline, no masses. Cardiovascular: No clubbing, cyanosis, or edema. Respiratory: Normal respiratory effort, no increased work of breathing. GI: Abdomen is soft, nontender, nondistended, no abdominal masses GU: No CVA tenderness Skin: No rashes, bruises or suspicious lesions. Neurologic: Grossly intact, no focal deficits, moving all 4 extremities. Psychiatric: Normal mood and affect.  Laboratory Data:  Lab Results  Component Value Date   WBC 11.8 (H) 05/27/2023   HGB 11.4 (L) 05/27/2023   HCT 34.1 (L) 05/27/2023   MCV 103.0 (H) 05/27/2023   PLT 187 05/27/2023    Lab Results  Component Value Date   CREATININE 0.40 (L) 05/27/2023    No results found for: PSA  No results found for: TESTOSTERONE  Lab Results  Component Value Date   HGBA1C 5.9 (H) 01/02/2020    Urinalysis    Component Value Date/Time   COLORURINE STRAW (A) 01/01/2020 1047   APPEARANCEUR Clear 01/27/2024 1417   LABSPEC 1.005 01/01/2020 1047   PHURINE 6.0 01/01/2020 1047   GLUCOSEU Negative 01/27/2024 1417   HGBUR MODERATE (A) 01/01/2020 1047   BILIRUBINUR Negative 01/27/2024 1417   KETONESUR NEGATIVE 01/01/2020 1047   PROTEINUR Negative 01/27/2024 1417   PROTEINUR NEGATIVE 01/01/2020 1047   UROBILINOGEN 0.2 07/16/2018 1314   UROBILINOGEN 0.2 12/12/2013 1122   NITRITE Negative 01/27/2024 1417   NITRITE NEGATIVE 01/01/2020 1047   LEUKOCYTESUR 1+ (A) 01/27/2024 1417   LEUKOCYTESUR TRACE (A) 01/01/2020 1047    Lab Results  Component Value Date   LABMICR See below: 01/27/2024   WBCUA 11-30 (A)  01/27/2024   RBCUA 11-30 (A) 07/16/2018   LABEPIT 0-10 01/27/2024   MUCUS Present 09/26/2019   BACTERIA Moderate (A) 01/27/2024    Pertinent Imaging: N/a   Assessment & Plan:    1. Urge incontinence of urine (Primary) Cont gemtesa . Discussed adding anticholinergic, PTNS and botox also discussed. Gemtesa  expensive - discussed every other day dosing and also trial of myrbetriq.  - Urinalysis, Routine w reflex microscopic; Future - BLADDER SCAN AMB NON-IMAGING - Urinalysis, Routine w reflex microscopic  2. AV -discussed the genital syndrome of menopause guidelines and recommendation for topical vaginal estrogen.  3.  Bacteriuria-not an adequate specimen.  Will continue to monitor.  No worrisome symptoms.  No follow-ups on file.  Donnice Brooks, Ford  [facility]  [address] [place], [place] [address] [phone]      [1]  Allergies Allergen Reactions   Codeine Nausea Only    Severe nausea.  Dizziness.   Macrobid  [Nitrofurantoin ] Other (See Comments)    Skin eruption   "

## 2024-08-01 NOTE — Progress Notes (Signed)
 Bladder Scan completed today due to reason of urge incontinence   Patient can void prior to the bladder scan. Bladder scan result: 0  Performed By: Exie T. CMA  Additional notes- Patient is scheduled to follow up with MD

## 2026-01-08 ENCOUNTER — Ambulatory Visit (HOSPITAL_BASED_OUTPATIENT_CLINIC_OR_DEPARTMENT_OTHER): Admitting: Obstetrics & Gynecology
